# Patient Record
Sex: Female | Born: 1990 | Race: White | Marital: Single | Smoking: Current every day smoker
Health system: Northeastern US, Academic
[De-identification: ages and names within clinical notes are randomized; demographics above are authoritative.]

## PROBLEM LIST (undated history)

## (undated) ENCOUNTER — Emergency Department (HOSPITAL_COMMUNITY): Admission: EM | Payer: Self-pay | Source: Home / Self Care

## (undated) ENCOUNTER — Inpatient Hospital Stay (HOSPITAL_COMMUNITY): Payer: Self-pay

## (undated) DIAGNOSIS — O26899 Other specified pregnancy related conditions, unspecified trimester: Secondary | ICD-10-CM

## (undated) DIAGNOSIS — A6009 Herpesviral infection of other urogenital tract: Secondary | ICD-10-CM

## (undated) DIAGNOSIS — G43909 Migraine, unspecified, not intractable, without status migrainosus: Secondary | ICD-10-CM

## (undated) DIAGNOSIS — R519 Headache, unspecified: Secondary | ICD-10-CM

## (undated) DIAGNOSIS — R51 Headache: Secondary | ICD-10-CM

## (undated) DIAGNOSIS — J45909 Unspecified asthma, uncomplicated: Secondary | ICD-10-CM

## (undated) HISTORY — DX: Other specified pregnancy related conditions, unspecified trimester: O26.899

## (undated) HISTORY — DX: Headache, unspecified: R51.9

## (undated) HISTORY — DX: Headache: R51

## (undated) HISTORY — PX: CHOLECYSTECTOMY: SHX55

---
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
## 2008-01-03 ENCOUNTER — Ambulatory Visit (HOSPITAL_COMMUNITY): Admission: RE | Admit: 2008-01-03 | Discharge: 2008-01-03 | Payer: Self-pay | Admitting: Obstetrics and Gynecology

## 2008-02-02 ENCOUNTER — Inpatient Hospital Stay (HOSPITAL_COMMUNITY): Admission: AD | Admit: 2008-02-02 | Discharge: 2008-02-02 | Payer: Self-pay | Admitting: Obstetrics & Gynecology

## 2008-02-03 ENCOUNTER — Observation Stay (HOSPITAL_COMMUNITY): Admission: AD | Admit: 2008-02-03 | Discharge: 2008-02-04 | Payer: Self-pay | Admitting: Obstetrics and Gynecology

## 2008-02-11 ENCOUNTER — Observation Stay (HOSPITAL_COMMUNITY): Admission: AD | Admit: 2008-02-11 | Discharge: 2008-02-12 | Payer: Self-pay | Admitting: Obstetrics and Gynecology

## 2008-03-03 ENCOUNTER — Inpatient Hospital Stay (HOSPITAL_COMMUNITY): Admission: AD | Admit: 2008-03-03 | Discharge: 2008-03-04 | Payer: Self-pay | Admitting: Obstetrics and Gynecology

## 2008-03-05 ENCOUNTER — Inpatient Hospital Stay (HOSPITAL_COMMUNITY): Admission: AD | Admit: 2008-03-05 | Discharge: 2008-03-05 | Payer: Self-pay | Admitting: Obstetrics and Gynecology

## 2008-03-08 ENCOUNTER — Inpatient Hospital Stay (HOSPITAL_COMMUNITY): Admission: RE | Admit: 2008-03-08 | Discharge: 2008-03-10 | Payer: Self-pay | Admitting: Obstetrics & Gynecology

## 2008-07-18 ENCOUNTER — Emergency Department (HOSPITAL_COMMUNITY): Admission: EM | Admit: 2008-07-18 | Discharge: 2008-07-18 | Payer: Self-pay | Admitting: Emergency Medicine

## 2008-08-19 ENCOUNTER — Inpatient Hospital Stay (HOSPITAL_COMMUNITY): Admission: AD | Admit: 2008-08-19 | Discharge: 2008-08-20 | Payer: Self-pay | Admitting: Obstetrics and Gynecology

## 2008-10-04 ENCOUNTER — Inpatient Hospital Stay (HOSPITAL_COMMUNITY): Admission: AD | Admit: 2008-10-04 | Discharge: 2008-10-05 | Payer: Self-pay | Admitting: Obstetrics & Gynecology

## 2008-10-07 ENCOUNTER — Inpatient Hospital Stay (HOSPITAL_COMMUNITY): Admission: AD | Admit: 2008-10-07 | Discharge: 2008-10-08 | Payer: Self-pay | Admitting: Obstetrics and Gynecology

## 2008-11-14 ENCOUNTER — Inpatient Hospital Stay (HOSPITAL_COMMUNITY): Admission: AD | Admit: 2008-11-14 | Discharge: 2008-11-14 | Payer: Self-pay | Admitting: Obstetrics & Gynecology

## 2009-03-19 ENCOUNTER — Inpatient Hospital Stay (HOSPITAL_COMMUNITY): Admission: AD | Admit: 2009-03-19 | Discharge: 2009-03-19 | Payer: Self-pay | Admitting: Obstetrics and Gynecology

## 2009-03-19 ENCOUNTER — Inpatient Hospital Stay (HOSPITAL_COMMUNITY): Admission: AD | Admit: 2009-03-19 | Discharge: 2009-03-19 | Payer: Self-pay | Admitting: Obstetrics & Gynecology

## 2009-04-08 ENCOUNTER — Inpatient Hospital Stay (HOSPITAL_COMMUNITY): Admission: AD | Admit: 2009-04-08 | Discharge: 2009-04-08 | Payer: Self-pay | Admitting: Obstetrics and Gynecology

## 2009-06-18 ENCOUNTER — Inpatient Hospital Stay (HOSPITAL_COMMUNITY): Admission: AD | Admit: 2009-06-18 | Discharge: 2009-06-19 | Payer: Self-pay | Admitting: Obstetrics & Gynecology

## 2009-11-08 ENCOUNTER — Emergency Department (HOSPITAL_COMMUNITY): Admission: EM | Admit: 2009-11-08 | Discharge: 2009-11-08 | Payer: Self-pay | Admitting: Emergency Medicine

## 2010-03-01 ENCOUNTER — Inpatient Hospital Stay (HOSPITAL_COMMUNITY): Admission: AD | Admit: 2010-03-01 | Discharge: 2010-03-01 | Payer: Self-pay | Admitting: Obstetrics & Gynecology

## 2010-03-01 ENCOUNTER — Ambulatory Visit: Payer: Self-pay | Admitting: Nurse Practitioner

## 2010-07-07 ENCOUNTER — Emergency Department (HOSPITAL_COMMUNITY)
Admission: EM | Admit: 2010-07-07 | Discharge: 2010-07-07 | Payer: Self-pay | Source: Home / Self Care | Admitting: Emergency Medicine

## 2010-09-12 LAB — URINALYSIS, ROUTINE W REFLEX MICROSCOPIC
Bilirubin Urine: NEGATIVE
Ketones, ur: NEGATIVE mg/dL
Nitrite: NEGATIVE
Protein, ur: 30 mg/dL — AB

## 2010-09-12 LAB — URINE CULTURE: Colony Count: 45000

## 2010-09-12 LAB — WET PREP, GENITAL

## 2010-09-12 LAB — URINE MICROSCOPIC-ADD ON

## 2010-09-12 LAB — GC/CHLAMYDIA PROBE AMP, GENITAL: GC Probe Amp, Genital: NEGATIVE

## 2010-09-17 LAB — COMPREHENSIVE METABOLIC PANEL
AST: 18 U/L (ref 0–37)
Albumin: 4.5 g/dL (ref 3.5–5.2)
Alkaline Phosphatase: 67 U/L (ref 39–117)
Chloride: 106 mEq/L (ref 96–112)
GFR calc Af Amer: >60 mL/min (ref >60)
Potassium: 3.8 mEq/L (ref 3.5–5.1)
Total Bilirubin: 1.2 mg/dL (ref 0.3–1.2)
Total Protein: 8 g/dL (ref 6.0–8.3)

## 2010-09-17 LAB — DIFFERENTIAL
Eosinophils Relative: 3 % (ref 0–5)
Lymphocytes Relative: 27 % (ref 12–46)
Lymphs Abs: 2.1 10*3/uL (ref 0.7–4.0)
Monocytes Relative: 7 % (ref 3–12)
Neutro Abs: 5 10*3/uL (ref 1.7–7.7)

## 2010-09-17 LAB — URINE MICROSCOPIC-ADD ON

## 2010-09-17 LAB — URINALYSIS, ROUTINE W REFLEX MICROSCOPIC
Leukocytes, UA: NEGATIVE
Protein, ur: NEGATIVE mg/dL
Specific Gravity, Urine: >1.03 — ABNORMAL HIGH (ref 1.005–1.030)
Urobilinogen, UA: 0.2 mg/dL (ref 0.0–1.0)

## 2010-09-17 LAB — CBC
Platelets: 278 10*3/uL (ref 150–400)
WBC: 7.8 10*3/uL (ref 4.0–10.5)

## 2010-09-17 LAB — LIPASE, BLOOD: Lipase: 27 U/L (ref 11–59)

## 2010-09-30 LAB — GC/CHLAMYDIA PROBE AMP, GENITAL: GC Probe Amp, Genital: NEGATIVE

## 2010-09-30 LAB — WET PREP, GENITAL
Clue Cells Wet Prep HPF POC: NONE SEEN
Trich, Wet Prep: NONE SEEN

## 2010-09-30 LAB — POCT PREGNANCY, URINE: Preg Test, Ur: NEGATIVE

## 2010-10-03 LAB — HERPES SIMPLEX VIRUS CULTURE

## 2010-10-03 LAB — WET PREP, GENITAL
Trich, Wet Prep: NONE SEEN
Yeast Wet Prep HPF POC: NONE SEEN

## 2010-10-03 LAB — GC/CHLAMYDIA PROBE AMP, GENITAL
Chlamydia, DNA Probe: POSITIVE — AB
GC Probe Amp, Genital: NEGATIVE

## 2010-10-04 LAB — URINE MICROSCOPIC-ADD ON

## 2010-10-04 LAB — CBC
MCHC: 35.2 g/dL (ref 30.0–36.0)
RBC: 4.14 MIL/uL (ref 3.87–5.11)

## 2010-10-04 LAB — HERPES SIMPLEX VIRUS CULTURE

## 2010-10-04 LAB — URINALYSIS, ROUTINE W REFLEX MICROSCOPIC
Glucose, UA: NEGATIVE mg/dL
Ketones, ur: NEGATIVE mg/dL
Protein, ur: NEGATIVE mg/dL

## 2010-10-04 LAB — WET PREP, GENITAL: Trich, Wet Prep: NONE SEEN

## 2010-10-08 LAB — URINALYSIS, ROUTINE W REFLEX MICROSCOPIC
Glucose, UA: NEGATIVE mg/dL
Leukocytes, UA: NEGATIVE
Nitrite: NEGATIVE
Protein, ur: NEGATIVE mg/dL
Urobilinogen, UA: 0.2 mg/dL (ref 0.0–1.0)

## 2010-10-08 LAB — URINE MICROSCOPIC-ADD ON

## 2010-10-08 LAB — WET PREP, GENITAL: Trich, Wet Prep: NONE SEEN

## 2010-10-09 LAB — URINALYSIS, ROUTINE W REFLEX MICROSCOPIC
Leukocytes, UA: NEGATIVE
Protein, ur: NEGATIVE mg/dL
Specific Gravity, Urine: 1.02 (ref 1.005–1.030)
Urobilinogen, UA: 0.2 mg/dL (ref 0.0–1.0)

## 2010-10-09 LAB — DIFFERENTIAL
Basophils Absolute: 0 10*3/uL (ref 0.0–0.1)
Basophils Relative: 1 % (ref 0–1)
Eosinophils Absolute: 0.5 10*3/uL (ref 0.0–0.7)
Eosinophils Relative: 8 % — ABNORMAL HIGH (ref 0–5)
Lymphs Abs: 2.5 10*3/uL (ref 0.7–4.0)
Neutrophils Relative %: 43 % (ref 43–77)

## 2010-10-09 LAB — CBC
HCT: 37.4 % (ref 36.0–46.0)
MCHC: 34.6 g/dL (ref 30.0–36.0)
MCV: 90.3 fL (ref 78.0–100.0)
Platelets: 240 10*3/uL (ref 150–400)
RDW: 13.6 % (ref 11.5–15.5)
WBC: 6.1 10*3/uL (ref 4.0–10.5)

## 2010-10-09 LAB — URINE MICROSCOPIC-ADD ON

## 2010-10-09 LAB — HCG, QUANTITATIVE, PREGNANCY: hCG, Beta Chain, Quant, S: <2 m[IU]/mL (ref <5)

## 2010-10-09 LAB — WET PREP, GENITAL: Yeast Wet Prep HPF POC: NONE SEEN

## 2010-10-10 LAB — POCT PREGNANCY, URINE: Preg Test, Ur: NEGATIVE

## 2010-10-14 LAB — CBC
Hemoglobin: 13.4 g/dL (ref 12.0–15.0)
MCHC: 34.5 g/dL (ref 30.0–36.0)
RBC: 4.33 MIL/uL (ref 3.87–5.11)

## 2010-10-14 LAB — DIFFERENTIAL
Basophils Relative: 0 % (ref 0–1)
Eosinophils Absolute: 0.3 10*3/uL (ref 0.0–0.7)
Eosinophils Relative: 5 % (ref 0–5)
Lymphs Abs: 2.3 10*3/uL (ref 0.7–4.0)
Monocytes Relative: 7 % (ref 3–12)

## 2010-10-14 LAB — URINALYSIS, ROUTINE W REFLEX MICROSCOPIC
Bilirubin Urine: NEGATIVE
Nitrite: NEGATIVE
Specific Gravity, Urine: 1.028 (ref 1.005–1.030)
Urobilinogen, UA: 1 mg/dL (ref 0.0–1.0)
pH: 6.5 (ref 5.0–8.0)

## 2010-10-14 LAB — POCT PREGNANCY, URINE: Preg Test, Ur: NEGATIVE

## 2010-10-14 LAB — COMPREHENSIVE METABOLIC PANEL
ALT: 27 U/L (ref 0–35)
AST: 21 U/L (ref 0–37)
Alkaline Phosphatase: 89 U/L (ref 39–117)
CO2: 27 mEq/L (ref 19–32)
Calcium: 8.8 mg/dL (ref 8.4–10.5)
GFR calc Af Amer: >60 mL/min (ref >60)
GFR calc non Af Amer: >60 mL/min (ref >60)
Potassium: 4.2 mEq/L (ref 3.5–5.1)
Sodium: 137 mEq/L (ref 135–145)
Total Protein: 7.3 g/dL (ref 6.0–8.3)

## 2010-10-15 LAB — CBC
HCT: 35.1 % — ABNORMAL LOW (ref 36.0–46.0)
Hemoglobin: 12.3 g/dL (ref 12.0–15.0)
MCHC: 34.9 g/dL (ref 30.0–36.0)
MCV: 89.9 fL (ref 78.0–100.0)
RDW: 13.1 % (ref 11.5–15.5)

## 2010-10-15 LAB — WET PREP, GENITAL

## 2010-11-12 NOTE — Discharge Summary (Signed)
NAMERAPHAEL, ESPE                ACCOUNT NO.:  [id number]   MEDICAL RECORD NO.:  [id number]           PATIENT TYPE:   LOCATION:                                [facility]  [facility]   PHYSICIAN:  Randye Lobo, M.D.   DATE OF BIRTH:  29-Aug-1990   DATE OF ADMISSION:  02/11/2008  DATE OF DISCHARGE:                               DISCHARGE SUMMARY   FINAL DIAGNOSES:  Intrauterine pregnancy at 51 weeks' gestation, preterm  labor, urinary tract infection.   COMPLICATIONS:  None.   This 20 year old G2, P1-0-0-1, presents at 32+ weeks' gestation  complaining of pressure.  The patient's antepartum course up to this  point had been complicated by late prenatal care.  The patient was Rh  negative, did receive RhoGAM per protocol.  The patient also has a  history of recurrent urinary tract infection, was on prophylaxis daily.  The patient also is an asthmatic, but just taking albuterol inhaler as  needed.  Upon admission, the patient's cervix was already 3 cm dilated.  She was contracting about every 5-10 minutes.  She was started on oral  Procardia.  Group B Strep culture was obtained.  The patient was also  found to have a urinary tract infection with a history of UTIs in the  past.  The patient had a fetal fibronectin that was performed upon  admission that did return negative.  The patient was also started on  Ceftin 250 mg b.i.d. for urinary tract infection.  She was felt ready  for discharge at this time.  She was sent home on moderate bedrest to  continue her Procardia 10 mg every 8 hours and her antibiotic, is to  return to our office in 3 days for a recheck as well as to call us with  any other contractions, pain, or problems.   LABS ON DISCHARGE:  Upon discharge, the patient did have a group B Strep  culture that returned positive.      Leilani Able, P.A.-C.      Randye Lobo, M.D.  Electronically Signed    MB/MEDQ  D:  03/06/2008  T:  03/06/2008  Job:  [id number]

## 2010-11-12 NOTE — Discharge Summary (Signed)
Katrina [name], Katrina [name]                ACCOUNT NO.:  [id number]   MEDICAL RECORD NO.:  [id number]          PATIENT TYPE:  OBV   LOCATION:  9317                          [facility]ILITY:  [facility]   PHYSICIAN:  Malva Limes, M.D.    DATE OF BIRTH:  22-Nov-1990   DATE OF ADMISSION:  02/11/2008  DATE OF DISCHARGE:  02/12/2008                               DISCHARGE SUMMARY   FINAL DIAGNOSIS:  1. Intrauterine pregnancy at 66 weeks' gestation.  2. Cholestasis of pregnancy.   COMPLICATIONS:  None.   HOSPITAL COURSE:  This 20 year old G2, P1-0-0-1, presents to the [facility] complaining of weeks of intense pruritus.  The patient's  antepartum course up to this point had been complicated by some preterm  contractions, which she was admitted for around 32 weeks as well as  asthma.  Upon admission, the patient's liver function as well as  delivery were noted to be elevated.  The patient was not contracting and  had good fetal activity.  The patient was diagnosed with cholestasis of  pregnancy.  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 for the cholestasis.  She  is to return to have twice weekly NSTs and LFT checks once weekly.  The  patient would be delivered at 37 weeks.  Dr. Dareen Piano did discuss this  with maternal fetal medicine as well.   LABORATORIES UPON DISCHARGE:  The patient had hemoglobin of 11.4, white  blood cell count of 8.4, platelets of 189,000, and did have elevated  liver function test as well as bilirubin.      Leilani Able, P.A.-C.      ______________________________  Malva Limes, M.D.    MB/MEDQ  D:  03/06/2008  T:  03/06/2008  Job:  [id number]

## 2010-11-15 NOTE — H&P (Signed)
[name], [name]                ACCOUNT NO.:  [id number]   MEDICAL RECORD NO.:  [id number]          PATIENT TYPE:  INP   LOCATION:  9165                          [facility]ILITY:  [facility]   PHYSICIAN:  Roseanna Rainbow, M.D.DATE OF BIRTH:  July 28, 1990   DATE OF ADMISSION:  06/28/2006  DATE OF DISCHARGE:                              HISTORY & PHYSICAL   CHIEF COMPLAINT:  The patient is a 20 year old para 0 with an estimated  date of confinement of July 08, 2006, with an intrauterine pregnancy  at 38+ weeks complaining of uterine contractions.   HISTORY OF PRESENT ILLNESS:  Please see the above.   ALLERGIES:  NO KNOWN DRUG ALLERGIES.   MEDICATIONS:  Please see the Medication Reconciliation Form.   OB RISK FACTORS:  1. Rh-negative, nonsensitized.  2. Asthma.  3. Marginal cord insertion.   PRENATAL LABS:  Chlamydia DNA probe negative, 1 hour GCT 131, GC DNA  probe negative, Hepatitis B Surface Antigen negative, hematocrit 36.7,  hemoglobin 12.3, HIV nonreactive, Pap smear negative, platelets 281,000.  Blood type is O negative, antibody screen negative.  RPR nonreactive,  rubella immune.   PAST GYN HISTORY:  Noncontributory.   PAST MEDICAL HISTORY:  Asthma.   PAST SURGICAL HISTORY:  No previous surgery.   SOCIAL HISTORY:  She is single, does not give any significant history of  alcohol usage, has no significant smoking history, denies illicit drug  use.   FAMILY HISTORY:  No major illnesses known.   PHYSICAL EXAM:  VITAL SIGNS:  Temperature 97.4, heart rate 66,  respirations 18, blood pressure 136/93.  Fetal heart tracing reassuring.  Tocodynamometer uterine contractions every 2-5 minutes.  Sterile vaginal  exam : the cervix is  90% effaced, vertex at a -2 station.   ASSESSMENT:  1. Primigravida with an intrauterine pregnancy at term.  2. Early labor.  3. Fetal heart tracing consistent with fetal well-being.  4. She is group B Streptococcus negative.  5. She is also  rhesus-negative, nonsensitized.   PLAN:  1. Admission.  2. Expected management.      Roseanna Rainbow, M.D.  Electronically Signed     LAJ/MEDQ  D:  06/28/2006  T:  06/28/2006  Job:  [id number]

## 2011-01-29 NOTE — Telephone Encounter (Deleted)
Pt called and states that the vials were received but not the syringes. Pls call [facility] on [address] [phone]. She said the pharmacy stated that nothing was there for her to pick up. Pls address asap since pt says that her headaches are unbearable. Thanks!

## 2011-03-20 LAB — GC/CHLAMYDIA PROBE AMP, GENITAL: GC Probe Amp, Genital: NEGATIVE

## 2011-03-20 LAB — WET PREP, GENITAL: Yeast Wet Prep HPF POC: NONE SEEN

## 2011-03-20 LAB — URINALYSIS, ROUTINE W REFLEX MICROSCOPIC
Hgb urine dipstick: NEGATIVE
Nitrite: NEGATIVE
Protein, ur: NEGATIVE
Urobilinogen, UA: 0.2

## 2011-03-20 LAB — POCT PREGNANCY, URINE
Operator id: 114931
Preg Test, Ur: POSITIVE

## 2011-03-25 LAB — URINALYSIS, ROUTINE W REFLEX MICROSCOPIC
Nitrite: NEGATIVE
Specific Gravity, Urine: 1.01
Urobilinogen, UA: 0.2

## 2011-03-25 LAB — DIFFERENTIAL
Basophils Absolute: 0.1
Lymphocytes Relative: 22
Monocytes Relative: 5
Neutro Abs: 5.9

## 2011-03-25 LAB — GC/CHLAMYDIA PROBE AMP, GENITAL: Chlamydia, DNA Probe: NEGATIVE

## 2011-03-25 LAB — CBC
HCT: 32.9 — ABNORMAL LOW
Platelets: 233
RDW: 14.7

## 2011-03-25 LAB — WET PREP, GENITAL
Trich, Wet Prep: NONE SEEN
Trich, Wet Prep: NONE SEEN
Yeast Wet Prep HPF POC: NONE SEEN
Yeast Wet Prep HPF POC: NONE SEEN

## 2011-03-27 LAB — RH IMMUNE GLOBULIN WORKUP (NOT WOMEN'S HOSP)

## 2011-03-28 LAB — WET PREP, GENITAL
Clue Cells Wet Prep HPF POC: NONE SEEN
Trich, Wet Prep: NONE SEEN

## 2011-03-28 LAB — URINALYSIS, ROUTINE W REFLEX MICROSCOPIC
Glucose, UA: NEGATIVE
Ketones, ur: NEGATIVE
Ketones, ur: NEGATIVE
Leukocytes, UA: NEGATIVE
Nitrite: POSITIVE — AB
Protein, ur: NEGATIVE
Specific Gravity, Urine: <1.005 — ABNORMAL LOW
pH: 6.5

## 2011-03-28 LAB — COMPREHENSIVE METABOLIC PANEL
Alkaline Phosphatase: 174 — ABNORMAL HIGH
BUN: 6
Chloride: 104
GFR calc non Af Amer: >60
Glucose, Bld: 101 — ABNORMAL HIGH
Potassium: 3.4 — ABNORMAL LOW
Total Bilirubin: 1.3 — ABNORMAL HIGH

## 2011-03-28 LAB — URINE MICROSCOPIC-ADD ON: RBC / HPF: NONE SEEN

## 2011-03-28 LAB — FETAL FIBRONECTIN: Fetal Fibronectin: NEGATIVE

## 2011-03-28 LAB — STREP B DNA PROBE

## 2011-04-02 LAB — CBC
HCT: 31.9 — ABNORMAL LOW
Hemoglobin: 11.1 — ABNORMAL LOW
MCHC: 34.7
RBC: 3.57 — ABNORMAL LOW
RDW: 14.6
WBC: 9.6

## 2011-04-02 LAB — HEPATIC FUNCTION PANEL
ALT: 172 — ABNORMAL HIGH
ALT: 173 — ABNORMAL HIGH
AST: 110 — ABNORMAL HIGH
Albumin: 2.4 — ABNORMAL LOW
Indirect Bilirubin: 0.7
Indirect Bilirubin: 1 — ABNORMAL HIGH
Total Protein: 5.6 — ABNORMAL LOW
Total Protein: 5.8 — ABNORMAL LOW

## 2011-04-02 LAB — RPR: RPR Ser Ql: NONREACTIVE

## 2011-04-02 LAB — RH IMMUNE GLOB WKUP(>/=20WKS)(NOT WOMEN'S HOSP)

## 2011-05-15 ENCOUNTER — Inpatient Hospital Stay (HOSPITAL_COMMUNITY)
Admission: AD | Admit: 2011-05-15 | Discharge: 2011-05-15 | Disposition: A | Discharge status: Home / Self Care | Payer: Self-pay | Source: Ambulatory Visit | Attending: Obstetrics and Gynecology | Admitting: Obstetrics and Gynecology

## 2011-05-15 ENCOUNTER — Inpatient Hospital Stay (HOSPITAL_COMMUNITY): Payer: Self-pay

## 2011-05-15 ENCOUNTER — Encounter (HOSPITAL_COMMUNITY): Payer: Self-pay

## 2011-05-15 DIAGNOSIS — R109 Unspecified abdominal pain: Secondary | ICD-10-CM | POA: Insufficient documentation

## 2011-05-15 DIAGNOSIS — N390 Urinary tract infection, site not specified: Secondary | ICD-10-CM | POA: Insufficient documentation

## 2011-05-15 HISTORY — DX: Herpesviral infection of other urogenital tract: A60.09

## 2011-05-15 LAB — URINE MICROSCOPIC-ADD ON

## 2011-05-15 LAB — URINALYSIS, ROUTINE W REFLEX MICROSCOPIC
Bilirubin Urine: NEGATIVE
Nitrite: POSITIVE — AB
Specific Gravity, Urine: 1.025 (ref 1.005–1.030)
pH: 7.5 (ref 5.0–8.0)

## 2011-05-15 LAB — CBC
HCT: 38.8 % (ref 36.0–46.0)
Hemoglobin: 13.6 g/dL (ref 12.0–15.0)
MCV: 93 fL (ref 78.0–100.0)
RDW: 13.5 % (ref 11.5–15.5)
WBC: 6 10*3/uL (ref 4.0–10.5)

## 2011-05-15 LAB — DIFFERENTIAL
Basophils Absolute: 0 10*3/uL (ref 0.0–0.1)
Eosinophils Relative: 3 % (ref 0–5)
Lymphocytes Relative: 26 % (ref 12–46)
Monocytes Absolute: 0.3 10*3/uL (ref 0.1–1.0)
Monocytes Relative: 4 % (ref 3–12)

## 2011-05-15 LAB — POCT PREGNANCY, URINE: Preg Test, Ur: NEGATIVE

## 2011-05-15 LAB — WET PREP, GENITAL

## 2011-05-15 MED ORDER — NITROFURANTOIN MONOHYD MACRO 100 MG PO CAPS
100.0000 mg | ORAL_CAPSULE | Freq: Once | ORAL | Status: AC
  Administered 2011-05-15: 100 mg via ORAL
  Filled 2011-05-15: qty 1

## 2011-05-15 MED ORDER — OXYCODONE-ACETAMINOPHEN 5-325 MG PO TABS
1.0000 | ORAL_TABLET | Freq: Once | ORAL | Status: AC
  Administered 2011-05-15: 1 via ORAL
  Filled 2011-05-15: qty 1

## 2011-05-15 MED ORDER — NITROFURANTOIN MONOHYD MACRO 100 MG PO CAPS: 100.0000 mg | ORAL_CAPSULE | Freq: Two times a day (BID) | ORAL | Status: AC

## 2011-05-15 NOTE — Progress Notes (Signed)
Pt states lower abdominal pain started 5 days ago, during & after having intercourse.

## 2011-05-15 NOTE — ED Provider Notes (Signed)
History     Chief Complaint  Patient presents with  . Abdominal Pain  . Emesis   HPI 20 y.o. presents with c/o lower abdominal pain and low back pain since having intercourse  5 days ago. Had Mirena IUD place by Dr Arlyce Dice 3 yrs ago but has not seen a doctor since then. States has been vomiting for several days. Last BM 3 days ago. No fever.   OB History    Grav Para Term Preterm Abortions TAB SAB Ect Mult Living   2 2 2  0 0 0 0 0 0 2      Past Medical History  Diagnosis Date  . Asthma   . Herpes simplex of female genitalia     last outbreak 4 months ago    Past Surgical History  Procedure Date  . No past surgeries     No family history on file.  History  Substance Use Topics  . Smoking status: Current Everyday Smoker -- 0.2 packs/day    Types: Cigarettes  . Smokeless tobacco: Not on file  . Alcohol Use: No    Allergies: No Known Allergies  Prescriptions prior to admission  Medication Sig Dispense Refill  . albuterol (PROVENTIL HFA;VENTOLIN HFA) 108 (90 BASE) MCG/ACT inhaler Inhale 2 puffs into the lungs every 6 (six) hours as needed. For SOB         Review of Systems  Constitutional: Negative for fever and chills.  Gastrointestinal: Positive for nausea, vomiting and abdominal pain ("all over"). Negative for diarrhea and constipation.  Genitourinary: Negative.        Neg for bleeding.   Positive for painful intercourse   Physical Exam   Blood pressure 128/90, pulse 75, temperature 98.5 F (36.9 C), resp. rate 18, height 5\' 1"  (1.549 m), weight 133 lb (60.328 kg).  Physical Exam  Nursing note and vitals reviewed. Constitutional: She is oriented to person, place, and time. She appears well-developed and well-nourished. No distress.  HENT:  Head: Normocephalic.  Neck: Normal range of motion.  Respiratory: Effort normal.  GI: Soft. She exhibits mass. There is tenderness (all quadrants with more pain on the right lower quadrant). There is rebound and  guarding.  Genitourinary: Uterus is tender. Uterus is not enlarged. Cervix exhibits motion tenderness and discharge (yellow discharge). Cervix exhibits no friability. Right adnexum displays tenderness. Left adnexum displays tenderness. There is tenderness around the vagina. No bleeding around the vagina. Vaginal discharge (moderate amount of yellow discharge) found.       Unable to see IUD string because of discomfort of exam  Neurological: She is alert and oriented to person, place, and time.  Skin: Skin is warm and dry.   Results for orders placed during the hospital encounter of 05/15/11 (from the past 24 hour(s))  URINALYSIS, ROUTINE W REFLEX MICROSCOPIC     Status: Abnormal   Collection Time   05/15/11  3:30 PM      Component Value Range   Color, Urine YELLOW  YELLOW    Appearance CLEAR  CLEAR    Specific Gravity, Urine 1.025  1.005 - 1.030    pH 7.5  5.0 - 8.0    Glucose, UA NEGATIVE  NEGATIVE (mg/dL)   Hgb urine dipstick TRACE (*) NEGATIVE    Bilirubin Urine NEGATIVE  NEGATIVE    Ketones, ur 15 (*) NEGATIVE (mg/dL)   Protein, ur NEGATIVE  NEGATIVE (mg/dL)   Urobilinogen, UA 2.0 (*) 0.0 - 1.0 (mg/dL)   Nitrite POSITIVE (*) NEGATIVE  Leukocytes, UA TRACE (*) NEGATIVE   URINE MICROSCOPIC-ADD ON     Status: Abnormal   Collection Time   05/15/11  3:30 PM      Component Value Range   Squamous Epithelial / LPF FEW (*) RARE    WBC, UA 11-20  <3 (WBC/hpf)   RBC / HPF 0-2  <3 (RBC/hpf)   Bacteria, UA MANY (*) RARE    Urine-Other MUCOUS PRESENT    POCT PREGNANCY, URINE     Status: Normal   Collection Time   05/15/11  3:37 PM      Component Value Range   Preg Test, Ur NEGATIVE    WET PREP, GENITAL     Status: Abnormal   Collection Time   05/15/11  4:12 PM      Component Value Range   Yeast, Wet Prep NONE SEEN  NONE SEEN    Trich, Wet Prep NONE SEEN  NONE SEEN    Clue Cells, Wet Prep NONE SEEN  NONE SEEN    WBC, Wet Prep HPF POC FEW (*) NONE SEEN   CBC     Status: Normal    Collection Time   05/15/11  4:29 PM      Component Value Range   WBC 6.0  4.0 - 10.5 (K/uL)   RBC 4.17  3.87 - 5.11 (MIL/uL)   Hemoglobin 13.6  12.0 - 15.0 (g/dL)   HCT 16.1  09.6 - 04.5 (%)   MCV 93.0  78.0 - 100.0 (fL)   MCH 32.6  26.0 - 34.0 (pg)   MCHC 35.1  30.0 - 36.0 (g/dL)   RDW 40.9  81.1 - 91.4 (%)   Platelets 265  150 - 400 (K/uL)  DIFFERENTIAL     Status: Normal   Collection Time   05/15/11  4:29 PM      Component Value Range   Neutrophils Relative 67  43 - 77 (%)   Neutro Abs 4.0  1.7 - 7.7 (K/uL)   Lymphocytes Relative 26  12 - 46 (%)   Lymphs Abs 1.6  0.7 - 4.0 (K/uL)   Monocytes Relative 4  3 - 12 (%)   Monocytes Absolute 0.3  0.1 - 1.0 (K/uL)   Eosinophils Relative 3  0 - 5 (%)   Eosinophils Absolute 0.2  0.0 - 0.7 (K/uL)   Basophils Relative 0  0 - 1 (%)   Basophils Absolute 0.0  0.0 - 0.1 (K/uL)       *RADIOLOGY REPORT*  Clinical Data: Pelvic pain.  TRANSABDOMINAL AND TRANSVAGINAL ULTRASOUND OF PELVIS  Technique: Both transabdominal and transvaginal ultrasound  examinations of the pelvis were performed. Transabdominal technique  was performed for global imaging of the pelvis including uterus,  ovaries, adnexal regions, and pelvic cul-de-sac.  Comparison: None.  It was necessary to proceed with endovaginal exam following the  transabdominal exam to visualize the ovaries and endometrium.  Findings:  Uterus: Measures 6.6 x 4.1 x 5.1 cm. No myometrial abnormalities.  Endometrium: Normal in thickness measuring a maximum of 4 mm. The  IUD is in the endometrial canal.  Right ovary: Measures 2.9 x 2.0 x 2.3 cm. No cyst or masses.  Left ovary: Measures 2.7 x 1.9 x 1.7 cm. No cysts or masses.  Other findings: No free fluid  IMPRESSION:  1. Normal sonographic appearance of the uterus and ovaries.  2. The IUD is in the endometrial canal.  Original Report Authenticated By: P. Loralie Champagne, M.D.     MAU Course  Procedures   GC/CHL culture to lab, Urine  culture to lab.  MDM    ROS and Exam by Lynder Parents, RN FNP   17:48 patient is back from u/s and is asking for pain medication.  She has a ride home.   Percocet 1 tab and Macrobid 1 tab given in MAU.Marland Kitchen   18:25 Patient states she is no longer having pain and is ready to go home.    Assessment and Plan  Report to Eve Camielle Sizer NP.  A: Abdominal Pain      Urinary Tract Infection  P:  Rx for Macrobid bid for 1 week       Increase po fluids     GCCHL and Urine cultures pending.   [facility] 05/15/2011, 4:02 PM   Matt Holmes, NP 05/15/11 1827

## 2011-05-15 NOTE — Progress Notes (Signed)
N&V x 5 days, emesis x 3 today, abd pain x 5 days, LMP last year, has IUD in place (DR Arlyce Dice) no longer sees this MD.

## 2011-05-16 LAB — GC/CHLAMYDIA PROBE AMP, GENITAL
Chlamydia, DNA Probe: NEGATIVE
GC Probe Amp, Genital: NEGATIVE

## 2011-05-17 LAB — URINE CULTURE

## 2011-05-22 NOTE — ED Provider Notes (Signed)
Agree with above note.  Katrina [name] 05/22/2011 8:21 PM

## 2011-06-17 ENCOUNTER — Encounter (HOSPITAL_COMMUNITY): Payer: Self-pay | Admitting: *Deleted

## 2011-06-17 ENCOUNTER — Emergency Department (HOSPITAL_COMMUNITY)
Admission: EM | Admit: 2011-06-17 | Discharge: 2011-06-18 | Disposition: A | Discharge status: Home / Self Care | Payer: Self-pay | Attending: Emergency Medicine | Admitting: Emergency Medicine

## 2011-06-17 DIAGNOSIS — R05 Cough: Secondary | ICD-10-CM | POA: Insufficient documentation

## 2011-06-17 DIAGNOSIS — J45909 Unspecified asthma, uncomplicated: Secondary | ICD-10-CM | POA: Insufficient documentation

## 2011-06-17 DIAGNOSIS — R51 Headache: Secondary | ICD-10-CM | POA: Insufficient documentation

## 2011-06-17 DIAGNOSIS — R509 Fever, unspecified: Secondary | ICD-10-CM | POA: Insufficient documentation

## 2011-06-17 DIAGNOSIS — R0602 Shortness of breath: Secondary | ICD-10-CM | POA: Insufficient documentation

## 2011-06-17 DIAGNOSIS — R110 Nausea: Secondary | ICD-10-CM | POA: Insufficient documentation

## 2011-06-17 DIAGNOSIS — J111 Influenza due to unidentified influenza virus with other respiratory manifestations: Secondary | ICD-10-CM | POA: Insufficient documentation

## 2011-06-17 DIAGNOSIS — R5383 Other fatigue: Secondary | ICD-10-CM | POA: Insufficient documentation

## 2011-06-17 DIAGNOSIS — R6889 Other general symptoms and signs: Secondary | ICD-10-CM | POA: Insufficient documentation

## 2011-06-17 DIAGNOSIS — R5381 Other malaise: Secondary | ICD-10-CM | POA: Insufficient documentation

## 2011-06-17 DIAGNOSIS — IMO0001 Reserved for inherently not codable concepts without codable children: Secondary | ICD-10-CM | POA: Insufficient documentation

## 2011-06-17 DIAGNOSIS — R059 Cough, unspecified: Secondary | ICD-10-CM | POA: Insufficient documentation

## 2011-06-17 MED ORDER — OSELTAMIVIR PHOSPHATE 75 MG PO CAPS: 75.0000 mg | ORAL_CAPSULE | Freq: Two times a day (BID) | ORAL | Status: AC

## 2011-06-17 MED ORDER — HYDROCOD POLST-CHLORPHEN POLST 10-8 MG/5ML PO LQCR: 5.0000 mL | Freq: Two times a day (BID) | ORAL | Status: DC

## 2011-06-17 MED ORDER — ACETAMINOPHEN 325 MG PO TABS
650.0000 mg | ORAL_TABLET | Freq: Once | ORAL | Status: AC
  Administered 2011-06-17: 650 mg via ORAL
  Filled 2011-06-17: qty 2

## 2011-06-17 MED ORDER — SODIUM CHLORIDE 0.9 % IV BOLUS (SEPSIS)
1000.0000 mL | Freq: Once | INTRAVENOUS | Status: AC
  Administered 2011-06-17: 1000 mL via INTRAVENOUS

## 2011-06-17 NOTE — ED Provider Notes (Signed)
History     CSN: [id number] Arrival date & time: 06/17/2011  5:52 PM   First MD Initiated Contact with Patient 06/17/11 2042      Chief Complaint  Patient presents with  . Chills  . Generalized Body Aches  . Headache  . Fever    (Consider location/radiation/quality/duration/timing/severity/associated sxs/prior treatment) HPI Comments: Patient here with a two day history of fever, chills, headache, nausea, cough, runny nose and body aches.  She reports no recent sick contacts, no flu shot this year - does not know how high her fever has been.  Patient is a 20 y.o. female presenting with headaches and fever. The history is provided by the patient. No language interpreter was used.  Headache  This is a new problem. The current episode started 2 days ago. The problem occurs constantly. The problem has not changed since onset.The headache is associated with nothing. The pain is located in the bilateral region. The quality of the pain is described as dull. The pain is at a severity of 6/10. The pain is moderate. The pain does not radiate. Associated symptoms include a fever, malaise/fatigue, shortness of breath and nausea. Pertinent negatives include no chest pressure, no near-syncope, no orthopnea, no syncope and no vomiting. She has tried nothing for the symptoms. The treatment provided no relief.  Fever Primary symptoms of the febrile illness include fever, headaches, shortness of breath and nausea. Primary symptoms do not include vomiting.    Past Medical History  Diagnosis Date  . Asthma   . Herpes simplex of female genitalia     last outbreak 4 months ago    Past Surgical History  Procedure Date  . No past surgeries     No family history on file.  History  Substance Use Topics  . Smoking status: Current Everyday Smoker -- 0.2 packs/day    Types: Cigarettes  . Smokeless tobacco: Not on file  . Alcohol Use: No    OB History    Grav Para Term Preterm Abortions TAB SAB  Ect Mult Living   2 2 2  0 0 0 0 0 0 2      Review of Systems  Constitutional: Positive for fever and malaise/fatigue.  Respiratory: Positive for shortness of breath.   Cardiovascular: Negative for orthopnea, syncope and near-syncope.  Gastrointestinal: Positive for nausea. Negative for vomiting.  Neurological: Positive for headaches.  All other systems reviewed and are negative.    Allergies  Review of patient's allergies indicates no known allergies.  Home Medications   Current Outpatient Rx  Name Route Sig Dispense Refill  . ALBUTEROL SULFATE HFA 108 (90 BASE) MCG/ACT IN AERS Inhalation Inhale 2 puffs into the lungs every 6 (six) hours as needed. For shortness of breath      BP 113/65  Pulse 129  Temp(Src) 99.3 F (37.4 C) (Oral)  Resp 22  SpO2 97%  Physical Exam  Nursing note and vitals reviewed. Constitutional: She is oriented to person, place, and time. She appears well-developed and well-nourished. No distress.  HENT:  Head: Normocephalic and atraumatic.  Right Ear: External ear normal.  Left Ear: External ear normal.  Mouth/Throat: Oropharynx is clear and moist. No oropharyngeal exudate.  Eyes: Conjunctivae are normal. Pupils are equal, round, and reactive to light. No scleral icterus.  Neck: Normal range of motion. Neck supple.  Cardiovascular: Regular rhythm and normal heart sounds.  Exam reveals no gallop and no friction rub.   No murmur heard.      tachycardia  Pulmonary/Chest: Effort normal and breath sounds normal. No respiratory distress. She has no wheezes. She exhibits no tenderness.  Abdominal: Soft. Bowel sounds are normal. She exhibits no distension. There is no tenderness.  Musculoskeletal: Normal range of motion.  Lymphadenopathy:    She has no cervical adenopathy.  Neurological: She is alert and oriented to person, place, and time. No cranial nerve deficit.  Skin: Skin is warm and dry. No rash noted. No erythema. No pallor.  Psychiatric: She  has a normal mood and affect. Her behavior is normal. Judgment and thought content normal.    ED Course  Procedures (including critical care time)  Labs Reviewed - No data to display No results found.   Influenza    MDM  Patient with influenza symptoms - remains with fever of 101.3 (oral) - will give tylenol for this and a liter of fluids.      Continues with headache - vital signs now normalized with HR 103, temp down to 99.8 oral - will discharge home   Scarlette Calico C. [place], [place] 06/17/11 2342

## 2011-06-17 NOTE — ED Notes (Signed)
Patient reports onset of fever, headache, bodyaches, and chills on Saturday.

## 2011-06-25 NOTE — ED Provider Notes (Signed)
Medical screening examination/treatment/procedure(s) were performed by non-physician practitioner and as supervising physician I was immediately available for consultation/collaboration.   Breawna Montenegro E Abbeygail Igoe, MD 06/25/11 0741 

## 2011-09-09 NOTE — Telephone Encounter (Signed)
Opened in error

## 2011-09-23 ENCOUNTER — Other Ambulatory Visit: Payer: Self-pay

## 2011-09-23 ENCOUNTER — Emergency Department (HOSPITAL_COMMUNITY)
Admission: EM | Admit: 2011-09-23 | Discharge: 2011-09-23 | Disposition: A | Discharge status: Home / Self Care | Payer: Self-pay | Attending: Emergency Medicine | Admitting: Emergency Medicine

## 2011-09-23 ENCOUNTER — Emergency Department (HOSPITAL_COMMUNITY): Payer: Self-pay

## 2011-09-23 ENCOUNTER — Encounter (HOSPITAL_COMMUNITY): Payer: Self-pay | Admitting: Emergency Medicine

## 2011-09-23 DIAGNOSIS — T50905A Adverse effect of unspecified drugs, medicaments and biological substances, initial encounter: Secondary | ICD-10-CM

## 2011-09-23 DIAGNOSIS — T43205A Adverse effect of unspecified antidepressants, initial encounter: Secondary | ICD-10-CM | POA: Insufficient documentation

## 2011-09-23 DIAGNOSIS — F17200 Nicotine dependence, unspecified, uncomplicated: Secondary | ICD-10-CM | POA: Insufficient documentation

## 2011-09-23 DIAGNOSIS — J45909 Unspecified asthma, uncomplicated: Secondary | ICD-10-CM | POA: Insufficient documentation

## 2011-09-23 DIAGNOSIS — F411 Generalized anxiety disorder: Secondary | ICD-10-CM | POA: Insufficient documentation

## 2011-09-23 DIAGNOSIS — R079 Chest pain, unspecified: Secondary | ICD-10-CM | POA: Insufficient documentation

## 2011-09-23 DIAGNOSIS — Y92009 Unspecified place in unspecified non-institutional (private) residence as the place of occurrence of the external cause: Secondary | ICD-10-CM | POA: Insufficient documentation

## 2011-09-23 LAB — CBC
HCT: 41 % (ref 36.0–46.0)
Hemoglobin: 14.5 g/dL (ref 12.0–15.0)
MCH: 32.2 pg (ref 26.0–34.0)
MCV: 91.1 fL (ref 78.0–100.0)
RBC: 4.5 MIL/uL (ref 3.87–5.11)

## 2011-09-23 LAB — BASIC METABOLIC PANEL
BUN: 11 mg/dL (ref 6–23)
CO2: 24 mEq/L (ref 19–32)
Calcium: 9.3 mg/dL (ref 8.4–10.5)
Chloride: 104 mEq/L (ref 96–112)
Creatinine, Ser: 0.67 mg/dL (ref 0.50–1.10)
Glucose, Bld: 96 mg/dL (ref 70–99)

## 2011-09-23 LAB — RAPID URINE DRUG SCREEN, HOSP PERFORMED
Amphetamines: NOT DETECTED
Barbiturates: NOT DETECTED
Tetrahydrocannabinol: NOT DETECTED

## 2011-09-23 MED ORDER — DIPHENHYDRAMINE HCL 50 MG/ML IJ SOLN
25.0000 mg | Freq: Once | INTRAMUSCULAR | Status: AC
  Administered 2011-09-23: 25 mg via INTRAVENOUS
  Filled 2011-09-23: qty 1

## 2011-09-23 MED ORDER — ONDANSETRON HCL 4 MG/2ML IJ SOLN
4.0000 mg | Freq: Once | INTRAMUSCULAR | Status: AC
  Administered 2011-09-23: 4 mg via INTRAVENOUS
  Filled 2011-09-23: qty 2

## 2011-09-23 MED ORDER — LORAZEPAM 2 MG/ML IJ SOLN
1.0000 mg | Freq: Once | INTRAMUSCULAR | Status: AC
  Administered 2011-09-23: 1 mg via INTRAVENOUS
  Filled 2011-09-23: qty 1

## 2011-09-23 MED ORDER — MORPHINE SULFATE 4 MG/ML IJ SOLN
4.0000 mg | Freq: Once | INTRAMUSCULAR | Status: AC
  Administered 2011-09-23: 4 mg via INTRAVENOUS
  Filled 2011-09-23: qty 1

## 2011-09-23 NOTE — ED Notes (Signed)
Telepsych  Physician Dr. Berlin Hun called Psych ED and stated that they wereready for telepsych.

## 2011-09-23 NOTE — ED Notes (Signed)
Pt called 911 due to chest pain/tightness and anxiety. Pt came in by PTAR. Pt states "I began feeling weird and was hearing noises and ringing in my ears.". Pts anxiety grew worse.

## 2011-09-23 NOTE — ED Notes (Signed)
Dr. Berlin Hun called and informed of tension and holding breath, negative drug screen and that the patient had started Paxil yesterday and could this be reaction of starting paxil as per EDPA

## 2011-09-23 NOTE — ED Notes (Signed)
PT is tearful and reports that "my body feels tight all over." Pt also reports that her chest hurts. Pt says that she has been hearing roaring loud sounds that sometimes "sound like sirens". EKG is being done.

## 2011-09-23 NOTE — ED Notes (Signed)
Patient transported to CT 

## 2011-09-23 NOTE — ED Provider Notes (Signed)
Medical screening examination/treatment/procedure(s) were conducted as a shared visit with non-physician practitioner(s) and myself.  I personally evaluated the patient during the encounter  Her symptoms seem secondary to initiating paxil. She's had improvement in her symptoms with benadryl and ativan. There is some of this that seems dystonic in nature. Labs and CT head were normal. Anxiety is playing a large role in this. Nothing to suggest seizure. Please see psychiatric consultation for full detail. recs include cutting her paxil dose in half. She will follow up with her psychiatrist. Dc home in good condition  Lyanne Co, MD 09/23/11 2019

## 2011-09-23 NOTE — Discharge Instructions (Signed)
Your symptoms is likely related to the new medication, Paxil.  Please cut your prescribed dose in half.  Your symptoms will improve over time.  Follow up with your provider for reevaluation, return if your symptoms worsen.   RESOURCE GUIDE  Dental Problems  Patients with Medicaid: [facility]                     [phone] [address]                                           Phone:  [phone]                                                  If unable to pay or uninsured, contact:  [facility] or [facility]. to become qualified for the adult dental clinic.  Chronic Pain Problems Contact [facility]  [phone] Patients need to be referred by their primary care doctor.  Insufficient Money for Medicine Contact [employer] Way:  call "211" or [facility] (838)351-2035.  No Primary Care Doctor Call [facility]  ([phone] Other agencies that provide inexpensive medical care    [facility]  [phone]    [facility]  [phone]    [facility]  [phone]    [facility]  [phone]    [facility]  [phone]    [facility]  [phone]  Substance Abuse [facility]  9144500334 [facility] [phone] [facility] [place] [phone] Floydene Flock [phone] Residential & Outpatient Substance Abuse Program  (731)465-4274  Psychological Services [facility]  ([phone] [facility]  ([phone] [facility]   ([phone] (emergency services (848)059-7068)  Abuse/Neglect [facility] Child Abuse Hotline (909) 879-4713 [facility] Child Abuse Hotline 5711555128 (After Hours)  Emergency Shelter [facility] (838)013-4661  M[facility] [place] [facility] [place] (254) [phone] [facility] (209)323-2352  MRSA Hotline #:   (620)259-6069    [facility]  Resources  [facility] [place]  [employer]                           [facility] Dept. [address] [place]                     [address]         [address]  [place]                                               Cristobal Goldmann Phone:  [phone]                                  Phone:  [phone]                   Phone:  [phone]  [facility] Mental Health Phone:  [phone]  [facility] Child Abuse Hotline (618)244-7073 949-360-2507 (After Hours)

## 2011-09-23 NOTE — ED Provider Notes (Signed)
History     CSN: [id number]  Arrival date & time 09/23/11  7829   First MD Initiated Contact with Patient 09/23/11 1004      Chief Complaint  Patient presents with  . Anxiety  . Chest Pain    (Consider location/radiation/quality/duration/timing/severity/associated sxs/prior treatment) HPI  21 year old female presents to the ED with chief complaints of anxiety. Patient states this morning while sitting in bed she experiencing loud noises to both of the ears. She describes noise as "siren" sound and very loud.  There was lasted for about 5 minutes and dissipate. During that episode she also experiencing increased heart rate, chest pain and tightness, tightness throughout the body, and feeling clammy. Her episode can last from seconds to minutes. These episodes are intermittent. She is experiencing increased anxiety and therefore called EMS.  Patient denies ever having these symptoms before. States she was her normal self last night. She denies any precipitating factors, denies any alleviating factors. She does admits to starting a new medication, Paxil, since yesterday for her depression. However, she denies increased stress, SI/HI, auditory of visual hallucination. Denies taking salicylates or other OTC medications.  Patient denies fever, nausea, vomiting, diarrhea, abdominal pain, back pain, dysuria, rash. She denies taking birth control pills, having recent surgery, or having prolonged bed rest. Denies leg swelling or calf tenderness. Patient denies alcohol use or recreational drug use. She is a smoker.  Past Medical History  Diagnosis Date  . Asthma   . Herpes simplex of female genitalia     last outbreak 4 months ago    Past Surgical History  Procedure Date  . No past surgeries     No family history on file.  History  Substance Use Topics  . Smoking status: Current Everyday Smoker -- 0.2 packs/day    Types: Cigarettes  . Smokeless tobacco: Not on file  . Alcohol Use: No     OB History    Grav Para Term Preterm Abortions TAB SAB Ect Mult Living   2 2 2  0 0 0 0 0 0 2      Review of Systems  All other systems reviewed and are negative.    Allergies  Review of patient's allergies indicates no known allergies.  Home Medications   Current Outpatient Rx  Name Route Sig Dispense Refill  . ALBUTEROL SULFATE HFA 108 (90 BASE) MCG/ACT IN AERS Inhalation Inhale 2 puffs into the lungs every 6 (six) hours as needed. For shortness of breath    . HYDROCOD POLST-CPM POLST ER 10-8 MG/5ML PO LQCR Oral Take 5 mLs by mouth every 12 (twelve) hours. 140 mL 0    BP 132/83  Pulse 115  Temp(Src) 98.8 F (37.1 C) (Oral)  Resp 20  SpO2 99%  Physical Exam  Nursing note and vitals reviewed. Constitutional: She appears well-developed and well-nourished. No distress.       Awake, alert, nontoxic appearance. Patient is tearful  HENT:  Head: Atraumatic.  Right Ear: External ear normal. Tympanic membrane is not perforated. No middle ear effusion. No decreased hearing is noted.  Left Ear: External ear normal. Tympanic membrane is not perforated.  No middle ear effusion. No decreased hearing is noted.  Mouth/Throat: Oropharynx is clear and moist. No oropharyngeal exudate.  Eyes: Conjunctivae and EOM are normal. Pupils are equal, round, and reactive to light. Right eye exhibits no discharge. Left eye exhibits no discharge.  Neck: Neck supple.  Cardiovascular: Normal rate and regular rhythm.   Pulmonary/Chest: Effort normal. No  respiratory distress. She exhibits no tenderness.       Mild tachycardia noted  Abdominal: Soft. There is no tenderness. There is no rebound.  Musculoskeletal: Normal range of motion. She exhibits no tenderness.       ROM appears intact, no obvious focal weakness  Neurological: She is alert.       Mental status and motor strength appears intact  Skin: Skin is warm. No rash noted.  Psychiatric: She has a normal mood and affect.    ED Course   Procedures (including critical care time)  Labs Reviewed - No data to display No results found.   No diagnosis found.   Date: 09/23/2011  Rate: 89  Rhythm: normal sinus rhythm  QRS Axis: normal  Intervals: normal  ST/T Wave abnormalities: normal  Conduction Disutrbances:none  Narrative Interpretation:   Old EKG Reviewed: none available  Results for orders placed during the hospital encounter of 09/23/11  CBC      Component Value Range   WBC 9.6  4.0 - 10.5 (K/uL)   RBC 4.50  3.87 - 5.11 (MIL/uL)   Hemoglobin 14.5  12.0 - 15.0 (g/dL)   HCT 16.1  09.6 - 04.5 (%)   MCV 91.1  78.0 - 100.0 (fL)   MCH 32.2  26.0 - 34.0 (pg)   MCHC 35.4  30.0 - 36.0 (g/dL)   RDW 40.9  81.1 - 91.4 (%)   Platelets 289  150 - 400 (K/uL)  BASIC METABOLIC PANEL      Component Value Range   Sodium 138  135 - 145 (mEq/L)   Potassium 3.8  3.5 - 5.1 (mEq/L)   Chloride 104  96 - 112 (mEq/L)   CO2 24  19 - 32 (mEq/L)   Glucose, Bld 96  70 - 99 (mg/dL)   BUN 11  6 - 23 (mg/dL)   Creatinine, Ser 7.82  0.50 - 1.10 (mg/dL)   Calcium 9.3  8.4 - 95.6 (mg/dL)   GFR calc non Af Amer >90  >90 (mL/min)   GFR calc Af Amer >90  >90 (mL/min)  URINE RAPID DRUG SCREEN (HOSP PERFORMED)      Component Value Range   Opiates NONE DETECTED  NONE DETECTED    Cocaine NONE DETECTED  NONE DETECTED    Benzodiazepines NONE DETECTED  NONE DETECTED    Amphetamines NONE DETECTED  NONE DETECTED    Tetrahydrocannabinol NONE DETECTED  NONE DETECTED    Barbiturates NONE DETECTED  NONE DETECTED   PREGNANCY, URINE      Component Value Range   Preg Test, Ur NEGATIVE  NEGATIVE    Ct Head Wo Contrast  09/23/2011  *RADIOLOGY REPORT*  Clinical Data: Chest pain, anxiety, auditory abnormalities  CT HEAD WITHOUT CONTRAST  Technique:  Contiguous axial images were obtained from the base of the skull through the vertex without contrast.  Comparison: 07/07/2010  Findings: There is no evidence of acute intracranial hemorrhage, brain edema,  mass lesion, acute infarction,   mass effect, or midline shift. Acute infarct may be inapparent on noncontrast CT. No other intra-axial abnormalities are seen, and the ventricles and sulci are within normal limits in size and symmetry.   No abnormal extra-axial fluid collections or masses are identified.  No significant calvarial abnormality.  IMPRESSION: 1. Negative for bleed or other acute intracranial process.  Original Report Authenticated By: Osa Craver, M.D.      MDM  New onset of anxiety and tinnitus. Patient just recently started on Paxil,  questionable drug reaction. EKG essentially normal. Physical examination is unremarkable. Ativan PO given here in the ED  11:55 AM My attending has seen and evaluate the pt.  Pt exhibits some distonic reaction.  Benadryl 50mg  IM given.  Will continue to monitor.    12:26 PM Pt continues to endorse discomfort with muscle tightening, blurry vision, headache.  Work up initiated with head CT, UDS, UA, BMP, CBC.  Morphine and zofran given for sxs treatment.  Currently stable VS  1:36 PM Work up is neg.  Pt's symptoms are apparent when provider or nurse in room, however, appears to be comfortable and talking on phone when no one is around.  Will have telepsych consult for further evaluation.    2:51 PM On eval pt is in no acute distress.  Awaits telepsych.  3:36 PM Mult attempts to consult telepsych without return response.  Will continue to try to set up telepsych.  Pt doing well.    4:15 PM Pt has been evaluated by telepsych and the psychiatrist recommend cutting pt's dose of paxil in halves as her sxs is likely medication induced.  Pt agrees with plan.  My attending and I had reevaluate pt prior to discharge.  Strict f/u instruction given.    Fayrene Helper, PA-C 09/23/11 1617

## 2011-09-28 ENCOUNTER — Emergency Department (HOSPITAL_COMMUNITY)
Admission: EM | Admit: 2011-09-28 | Discharge: 2011-09-28 | Disposition: A | Discharge status: Home / Self Care | Payer: Self-pay | Attending: Emergency Medicine | Admitting: Emergency Medicine

## 2011-09-28 ENCOUNTER — Encounter (HOSPITAL_COMMUNITY): Payer: Self-pay

## 2011-09-28 ENCOUNTER — Other Ambulatory Visit: Payer: Self-pay

## 2011-09-28 DIAGNOSIS — R000 Tachycardia, unspecified: Secondary | ICD-10-CM | POA: Insufficient documentation

## 2011-09-28 DIAGNOSIS — J45909 Unspecified asthma, uncomplicated: Secondary | ICD-10-CM | POA: Insufficient documentation

## 2011-09-28 DIAGNOSIS — R209 Unspecified disturbances of skin sensation: Secondary | ICD-10-CM | POA: Insufficient documentation

## 2011-09-28 DIAGNOSIS — F411 Generalized anxiety disorder: Secondary | ICD-10-CM | POA: Insufficient documentation

## 2011-09-28 DIAGNOSIS — F410 Panic disorder [episodic paroxysmal anxiety] without agoraphobia: Secondary | ICD-10-CM | POA: Insufficient documentation

## 2011-09-28 MED ORDER — LORAZEPAM 1 MG PO TABS
1.0000 mg | ORAL_TABLET | Freq: Once | ORAL | Status: AC
  Administered 2011-09-28: 1 mg via ORAL
  Filled 2011-09-28: qty 1

## 2011-09-28 NOTE — Discharge Instructions (Signed)
Please read and follow all provided instructions.  Your diagnoses today include:  1. Anxiety attack     Tests performed today include:  Vital signs. See below for your results today.   Medications prescribed:   None  Home care instructions:  Follow any educational materials contained in this packet.  Follow-up instructions: Please follow-up with your psychiatric or primary care doctor in the next 3 days for further evaluation of your symptoms. If you do not have a primary care doctor -- see below for referral information.   Return instructions:   Please return to the Emergency Department if you experience worsening symptoms.   Please return if you have any other emergent concerns.  Additional Information:  Your vital signs today were: BP 126/75  Pulse 79  Temp(Src) 98.1 F (36.7 C) (Oral)  Resp 20  SpO2 98% If your blood pressure (BP) was elevated above 135/85 this visit, please have this repeated by your doctor within one month. -------------- No Primary Care Doctor Call [facility]  [phone] Other agencies that provide inexpensive medical care    [facility]  [phone]    [facility]  [phone]    [facility]  [phone]    [facility]  [phone]    [facility]  [phone]    [facility]  [phone] -------------- RESOURCE GUIDE:  Dental Problems  Patients with Medicaid: [facility] [id number]                                            [phone] [address] Phone:  [phone]                                                   Phone:  [phone]  If unable to pay or uninsured, contact:  [facility] or [facility]. to become qualified for the adult dental clinic.  Chronic Pain Problems Contact [facility]  [phone] Patients need to be referred by their primary care doctor.  Insufficient Money for  Medicine Contact [employer]:  call "211" or [facility] [phone].  Psychological Services [facility]  [phone] [facility]  [phone] [facility] Mental Health   [phone] (emergency services [phone])  Substance Abuse Resources [facility]  [phone] [facility] [phone] [facility] [phone] Floydene Flock [phone] Residential & Outpatient Substance Abuse Program  [phone]  Abuse/Neglect [facility] Child Abuse Hotline [phone] [facility] Child Abuse Hotline [phone] (After Hours)  Emergency Shelter [facility] [phone]  Maternity Homes Room at the [place] [facility] [place] [phone] [facility] [phone]  [facility] Resources  [facility]     [employer]                          [facility]. [address] [place]  [address]      [address]  Blondell Reveal Phone:  [phone]                                   Phone:  [phone]                 Phone:  [phone]  [facility] Phone:  [phone]  [facility] Child Abuse Hotline [phone] [phone] (After Hours)

## 2011-09-28 NOTE — ED Notes (Signed)
Pt. Reports having periods where it feels that her heart is racing,  Rt. Cheek feels numb and her lt hand feels numb

## 2011-09-28 NOTE — ED Notes (Signed)
Patient states that she is feeling much better.  States that she is ready to go home.  Josh, PA made aware of same.

## 2011-09-28 NOTE — ED Notes (Signed)
MD at bedside. 

## 2011-09-28 NOTE — ED Provider Notes (Signed)
History     CSN: [id number]  Arrival date & time 09/28/11  1041   First MD Initiated Contact with Patient 09/28/11 1117      Chief Complaint  Patient presents with  . Anxiety    Pt. was just diagnosed with anxiety and placed on Paxil last week for depression    (Consider location/radiation/quality/duration/timing/severity/associated sxs/prior treatment) HPI Comments: Patient recently started on Paxil approximately one week ago states that her anxiety is gotten worse. She was seen several days ago at [facility] emergency department and had a negative workup. Telepsychiatrist recommended decreasing her Paxil dose by one half. Her symptoms are still recurrent. She states she feels her heart racing, previous fast, and has numbness in her face and left hand.  Patient is a 21 y.o. female presenting with anxiety. The history is provided by the patient.  Anxiety This is a recurrent problem. The current episode started today. The problem has been unchanged. Associated symptoms include numbness (Paresthesias). Pertinent negatives include no abdominal pain, chest pain, coughing, fever, headaches, myalgias, nausea, rash, sore throat or vomiting. The symptoms are aggravated by nothing. She has tried nothing for the symptoms.    Past Medical History  Diagnosis Date  . Asthma   . Herpes simplex of female genitalia     last outbreak 4 months ago  . Anxiety     Past Surgical History  Procedure Date  . No past surgeries     No family history on file.  History  Substance Use Topics  . Smoking status: Current Everyday Smoker -- 0.2 packs/day    Types: Cigarettes  . Smokeless tobacco: Not on file  . Alcohol Use: No    OB History    Grav Para Term Preterm Abortions TAB SAB Ect Mult Living   2 2 2  0 0 0 0 0 0 2      Review of Systems  Constitutional: Negative for fever.  HENT: Negative for sore throat and rhinorrhea.   Eyes: Negative for redness.  Respiratory: Negative for cough and  shortness of breath.   Cardiovascular: Negative for chest pain.  Gastrointestinal: Negative for nausea, vomiting, abdominal pain and diarrhea.  Genitourinary: Negative for dysuria.  Musculoskeletal: Negative for myalgias.  Skin: Negative for rash.  Neurological: Positive for numbness (Paresthesias). Negative for headaches.    Allergies  Review of patient's allergies indicates no known allergies.  Home Medications   Current Outpatient Rx  Name Route Sig Dispense Refill  . ALBUTEROL SULFATE HFA 108 (90 BASE) MCG/ACT IN AERS Inhalation Inhale 2 puffs into the lungs every 6 (six) hours as needed. For shortness of breath    . LAMOTRIGINE 25 MG PO TABS Oral Take 25 mg by mouth 2 (two) times daily.    Marland Kitchen PAROXETINE HCL 10 MG PO TABS Oral Take 5 mg by mouth every morning.       BP 126/75  Pulse 79  Temp(Src) 98.1 F (36.7 C) (Oral)  Resp 20  SpO2 98%  Physical Exam  Nursing note and vitals reviewed. Constitutional: She is oriented to person, place, and time. She appears well-developed and well-nourished.  HENT:  Head: Normocephalic and atraumatic.  Eyes: Conjunctivae are normal. Right eye exhibits no discharge. Left eye exhibits no discharge.  Neck: Normal range of motion. Neck supple.  Cardiovascular: Normal rate, regular rhythm and normal heart sounds.   Pulmonary/Chest: Effort normal and breath sounds normal.  Abdominal: Soft. There is no tenderness.  Neurological: She is alert and oriented to person,  place, and time. She has normal strength. No cranial nerve deficit or sensory deficit. GCS eye subscore is 4. GCS verbal subscore is 5. GCS motor subscore is 6.  Skin: Skin is warm and dry.  Psychiatric: Her mood appears anxious.    ED Course  Procedures (including critical care time)  Labs Reviewed - No data to display No results found.   1. Anxiety attack     11:57 AM Patient seen and examined. Medications ordered.   Vital signs reviewed and are as follows: Filed  Vitals:   09/28/11 1046  BP: 126/75  Pulse: 79  Temp: 98.1 F (36.7 C)  Resp: 20   Patient seen prior to discharge. Her symptoms are resolved with ativan. She states that she threw the Paxil away and wishes not to take this. Given this, will not discharge home on any medications. I have urged her to follow-up with PCP/psychiatrist in next week to discuss treatment of her anxiety. Patient and friend/family agree with this plan and are comfortable with this. Discharge to home.   MDM  Patient with anxiety with paresthesias, no gross neurological deficit, improved with ativan. She has discontinued Paxil. Patient counseled. She appears well and stable at discharge.         [place], [place] 09/28/11 [phone]

## 2011-09-28 NOTE — ED Notes (Signed)
Patient states that she started taking any depressants on Monday. Pt states that she had an episode of anxiety on Tuesday.  Pt states that this morning she took her medicine and now has anxiety again.  Pt does appear to be anxious.  Pt is holding her chest and states that she has pain and SOB with same.  Pt is tearful at this time.  No respiratory distress noted at this time.

## 2011-09-28 NOTE — ED Provider Notes (Signed)
Medical screening examination/treatment/procedure(s) were performed by non-physician practitioner and as supervising physician I was immediately available for consultation/collaboration.    Nelia Shi, MD 09/28/11 2130

## 2011-11-25 ENCOUNTER — Emergency Department (HOSPITAL_COMMUNITY)
Admission: EM | Admit: 2011-11-25 | Discharge: 2011-11-25 | Discharge status: Against Medical Advice | Payer: Self-pay | Attending: Emergency Medicine | Admitting: Emergency Medicine

## 2011-11-25 ENCOUNTER — Encounter (HOSPITAL_COMMUNITY): Payer: Self-pay | Admitting: Emergency Medicine

## 2011-11-25 DIAGNOSIS — F411 Generalized anxiety disorder: Secondary | ICD-10-CM | POA: Insufficient documentation

## 2012-03-18 ENCOUNTER — Encounter (HOSPITAL_COMMUNITY): Payer: Self-pay | Admitting: Emergency Medicine

## 2012-03-18 ENCOUNTER — Emergency Department (HOSPITAL_COMMUNITY): Payer: Self-pay

## 2012-03-18 ENCOUNTER — Emergency Department (HOSPITAL_COMMUNITY)
Admission: EM | Admit: 2012-03-18 | Discharge: 2012-03-18 | Disposition: A | Discharge status: Home / Self Care | Payer: Self-pay | Attending: Emergency Medicine | Admitting: Emergency Medicine

## 2012-03-18 DIAGNOSIS — J45909 Unspecified asthma, uncomplicated: Secondary | ICD-10-CM | POA: Insufficient documentation

## 2012-03-18 DIAGNOSIS — M25519 Pain in unspecified shoulder: Secondary | ICD-10-CM | POA: Insufficient documentation

## 2012-03-18 DIAGNOSIS — M542 Cervicalgia: Secondary | ICD-10-CM | POA: Insufficient documentation

## 2012-03-18 DIAGNOSIS — F17200 Nicotine dependence, unspecified, uncomplicated: Secondary | ICD-10-CM | POA: Insufficient documentation

## 2012-03-18 DIAGNOSIS — F329 Major depressive disorder, single episode, unspecified: Secondary | ICD-10-CM | POA: Insufficient documentation

## 2012-03-18 DIAGNOSIS — F3289 Other specified depressive episodes: Secondary | ICD-10-CM | POA: Insufficient documentation

## 2012-03-18 DIAGNOSIS — F411 Generalized anxiety disorder: Secondary | ICD-10-CM | POA: Insufficient documentation

## 2012-03-18 MED ORDER — PREDNISONE 20 MG PO TABS: ORAL_TABLET | ORAL | Status: DC

## 2012-03-18 MED ORDER — HYDROCODONE-ACETAMINOPHEN 5-500 MG PO TABS: 1.0000 | ORAL_TABLET | Freq: Four times a day (QID) | ORAL | Status: DC | PRN

## 2012-03-18 MED ORDER — PREDNISONE 20 MG PO TABS
60.0000 mg | ORAL_TABLET | Freq: Once | ORAL | Status: AC
  Administered 2012-03-18: 60 mg via ORAL
  Filled 2012-03-18: qty 3

## 2012-03-18 NOTE — ED Notes (Signed)
PT. 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 DENIES CHEST PAIN OR SOB.

## 2012-03-18 NOTE — ED Provider Notes (Signed)
History     CSN: [id number]  Arrival date & time 03/18/12  4098   First MD Initiated Contact with Patient 03/18/12 0700      Chief Complaint  Patient presents with  . Shoulder Pain    (Consider location/radiation/quality/duration/timing/severity/associated sxs/prior treatment) Patient is a 21 y.o. female presenting with shoulder pain. The history is provided by the patient.  Shoulder Pain Pertinent negatives include no chest pain, no headaches and no shortness of breath.  pt c/o right neck pain for the past 1-2 weeks. Dull, constant, worse w certain positions of neck. No headache. No specific injury recalled. Works as Child psychotherapist. Pain radiates from right neck to shoulder. Occasionally tingling/numbness sensation to right arm. No weakness. No loss of rom or dexterity. No pain w rom shoulder. No fever or chills. No hx ddd. otc meds not helping pain.     Past Medical History  Diagnosis Date  . Asthma   . Herpes simplex of female genitalia     last outbreak 4 months ago  . Anxiety   . Depression     Past Surgical History  Procedure Date  . No past surgeries     No family history on file.  History  Substance Use Topics  . Smoking status: Current Every Day Smoker -- 0.2 packs/day    Types: Cigarettes  . Smokeless tobacco: Not on file  . Alcohol Use: No    OB History    Grav Para Term Preterm Abortions TAB SAB Ect Mult Living   2 2 2  0 0 0 0 0 0 2      Review of Systems  Constitutional: Negative for fever.  HENT: Positive for neck pain.   Respiratory: Negative for shortness of breath.   Cardiovascular: Negative for chest pain.  Neurological: Negative for weakness and headaches.    Allergies  Review of patient's allergies indicates no known allergies.  Home Medications   Current Outpatient Rx  Name Route Sig Dispense Refill  . ACETAMINOPHEN 500 MG PO TABS Oral Take 2,000 mg by mouth every 6 (six) hours as needed.    . ARIPIPRAZOLE 10 MG PO TABS Oral Take 10  mg by mouth daily.    Marland Kitchen ESCITALOPRAM OXALATE 10 MG PO TABS Oral Take 10 mg by mouth daily.    Marland Kitchen NAPROXEN SODIUM 220 MG PO TABS Oral Take 660 mg by mouth 2 (two) times daily with a meal.    . ALBUTEROL SULFATE HFA 108 (90 BASE) MCG/ACT IN AERS Inhalation Inhale 2 puffs into the lungs every 6 (six) hours as needed. For shortness of breath      BP 123/76  Pulse 65  Temp 98.8 F (37.1 C) (Oral)  Resp 14  SpO2 100%  LMP 03/17/2012  Physical Exam  Nursing note and vitals reviewed. Constitutional: She is oriented to person, place, and time. She appears well-developed and well-nourished. No distress.  HENT:  Mouth/Throat: Oropharynx is clear and moist.  Eyes: Conjunctivae normal are normal. No scleral icterus.  Neck: Neck supple. No tracheal deviation present.  Cardiovascular: Normal rate.   Pulmonary/Chest: Effort normal. No respiratory distress.  Abdominal: Normal appearance.  Musculoskeletal: She exhibits no edema.       Right neck and trapezius muscle tenderness.  CTLS spine, non tender, aligned, no step off. Good rom right shoulder without pain. Radial pulse 2+.  Neurological: She is alert and oriented to person, place, and time.       Motor intact bil.  Steady gait. R/m/u n  fxn intact.   Skin: Skin is warm and dry. No rash noted.  Psychiatric: She has a normal mood and affect.    ED Course  Procedures (including critical care time)  Dg Cervical Spine Complete  03/18/2012  *RADIOLOGY REPORT*  Clinical Data: Right neck pain  CERVICAL SPINE - COMPLETE 4+ VIEW  Comparison: 07/07/2010 CT  Findings: The imaged vertebral bodies and inter-vertebral disc spaces are maintained. No displaced acute fracture or dislocation identified.   The para-vertebral and overlying soft tissues are within normal limits.  Lung apices are clear.  IMPRESSION: Normal radiographic appearance of the cervical spine.   Original Report Authenticated By: Waneta Martins, M.D.       MDM  Xray. Pt states otc  meds not helping. Will give course pred, vicodin.         Suzi Roots, MD 03/18/12 [phone]

## 2012-05-03 ENCOUNTER — Inpatient Hospital Stay (HOSPITAL_COMMUNITY): Payer: Self-pay

## 2012-05-03 ENCOUNTER — Inpatient Hospital Stay (HOSPITAL_COMMUNITY)
Admission: AD | Admit: 2012-05-03 | Discharge: 2012-05-03 | Disposition: A | Discharge status: Home / Self Care | Payer: Self-pay | Source: Ambulatory Visit | Attending: Obstetrics & Gynecology | Admitting: Obstetrics & Gynecology

## 2012-05-03 ENCOUNTER — Encounter (HOSPITAL_COMMUNITY): Payer: Self-pay | Admitting: *Deleted

## 2012-05-03 DIAGNOSIS — R112 Nausea with vomiting, unspecified: Secondary | ICD-10-CM | POA: Insufficient documentation

## 2012-05-03 DIAGNOSIS — R102 Pelvic and perineal pain: Secondary | ICD-10-CM

## 2012-05-03 DIAGNOSIS — A499 Bacterial infection, unspecified: Secondary | ICD-10-CM | POA: Insufficient documentation

## 2012-05-03 DIAGNOSIS — N760 Acute vaginitis: Secondary | ICD-10-CM | POA: Insufficient documentation

## 2012-05-03 DIAGNOSIS — B9689 Other specified bacterial agents as the cause of diseases classified elsewhere: Secondary | ICD-10-CM | POA: Insufficient documentation

## 2012-05-03 DIAGNOSIS — N949 Unspecified condition associated with female genital organs and menstrual cycle: Secondary | ICD-10-CM | POA: Insufficient documentation

## 2012-05-03 LAB — COMPREHENSIVE METABOLIC PANEL
ALT: 20 U/L (ref 0–35)
Alkaline Phosphatase: 56 U/L (ref 39–117)
BUN: 12 mg/dL (ref 6–23)
CO2: 24 mEq/L (ref 19–32)
Chloride: 102 mEq/L (ref 96–112)
GFR calc Af Amer: >90 mL/min (ref >90)
Glucose, Bld: 99 mg/dL (ref 70–99)
Potassium: 3.5 mEq/L (ref 3.5–5.1)
Total Bilirubin: 1 mg/dL (ref 0.3–1.2)

## 2012-05-03 LAB — CBC
HCT: 36.4 % (ref 36.0–46.0)
Hemoglobin: 13.1 g/dL (ref 12.0–15.0)
RBC: 3.97 MIL/uL (ref 3.87–5.11)
WBC: 7.2 10*3/uL (ref 4.0–10.5)

## 2012-05-03 LAB — URINALYSIS, ROUTINE W REFLEX MICROSCOPIC
Bilirubin Urine: NEGATIVE
Leukocytes, UA: NEGATIVE
Nitrite: NEGATIVE
Specific Gravity, Urine: 1.02 (ref 1.005–1.030)
pH: 7.5 (ref 5.0–8.0)

## 2012-05-03 LAB — POCT PREGNANCY, URINE: Preg Test, Ur: NEGATIVE

## 2012-05-03 LAB — WET PREP, GENITAL

## 2012-05-03 LAB — URINE MICROSCOPIC-ADD ON

## 2012-05-03 MED ORDER — RANITIDINE HCL 150 MG PO TABS: 150.0000 mg | ORAL_TABLET | Freq: Two times a day (BID) | ORAL | Status: DC

## 2012-05-03 MED ORDER — ONDANSETRON 8 MG PO TBDP
8.0000 mg | ORAL_TABLET | Freq: Once | ORAL | Status: AC
  Administered 2012-05-03: 8 mg via ORAL
  Filled 2012-05-03: qty 1

## 2012-05-03 MED ORDER — KETOROLAC TROMETHAMINE 60 MG/2ML IM SOLN
60.0000 mg | Freq: Once | INTRAMUSCULAR | Status: AC
  Administered 2012-05-03: 60 mg via INTRAMUSCULAR
  Filled 2012-05-03: qty 2

## 2012-05-03 MED ORDER — METRONIDAZOLE 500 MG PO TABS: 500.0000 mg | ORAL_TABLET | Freq: Two times a day (BID) | ORAL | Status: DC

## 2012-05-03 MED ORDER — PROMETHAZINE HCL 25 MG PO TABS: 25.0000 mg | ORAL_TABLET | Freq: Four times a day (QID) | ORAL | Status: DC | PRN

## 2012-05-03 NOTE — MAU Note (Signed)
Vomitting for 2 weeks, dizzy, lightheaded.  Lower abd cramping for past 5 days.  Pt denies bleeding or discharge.

## 2012-05-03 NOTE — MAU Provider Note (Signed)
History     CSN: [id number]  Arrival date and time: 05/03/12 1328   None     Chief Complaint  Patient presents with  . Abdominal Pain  . Emesis   HPI 21 y.o. [id number] with n/v and pelvic pain x 2 weeks, worsening. Vomiting everyday, can't keep anything down. Hasn't eaten today, yesterday had a chicken burrito from a [place]. Pelvic pain is crampy in nature. No discharge or bleeding. Has mirena IUD x 4 years.    Past Medical History  Diagnosis Date  . Asthma   . Herpes simplex of female genitalia     last outbreak 4 months ago  . Anxiety   . Depression     Past Surgical History  Procedure Date  . No past surgeries     History reviewed. No pertinent family history.  History  Substance Use Topics  . Smoking status: Current Every Day Smoker -- 0.2 packs/day    Types: Cigarettes  . Smokeless tobacco: Not on file  . Alcohol Use: No    Allergies: No Known Allergies  Prescriptions prior to admission  Medication Sig Dispense Refill  . escitalopram (LEXAPRO) 10 MG tablet Take 10 mg by mouth daily.      Marland Kitchen lurasidone (LATUDA) 40 MG TABS Take 40 mg by mouth daily with breakfast.        Review of Systems  Constitutional: Negative.  Negative for fever and chills.  Respiratory: Negative.   Cardiovascular: Negative.   Gastrointestinal: Positive for nausea, vomiting and abdominal pain. Negative for diarrhea and constipation.  Genitourinary: Negative for dysuria, urgency, frequency, hematuria and flank pain.       Negative for vaginal bleeding, vaginal discharge  Musculoskeletal: Negative.   Neurological: Negative.   Psychiatric/Behavioral: Negative.    Physical Exam   Blood pressure 119/76, pulse 64, temperature 97.3 F (36.3 C), temperature source Oral, resp. rate 16, height 5\' 1"  (1.549 m), weight 138 lb 9.6 oz (62.869 kg).  Physical Exam  Nursing note and vitals reviewed. Constitutional: She is oriented to person, place, and time. She appears  well-developed and well-nourished. No distress.  HENT:  Head: Normocephalic and atraumatic.  Cardiovascular: Normal rate.   Respiratory: Effort normal.  GI: Soft. She exhibits no distension and no mass. There is no tenderness. There is no rebound and no guarding.  Genitourinary: There is no rash or lesion on the right labia. There is no rash or lesion on the left labia. Uterus is tender. Uterus is not enlarged. Cervix exhibits no motion tenderness, no discharge and no friability. Right adnexum displays tenderness. Right adnexum displays no mass and no fullness. Left adnexum displays no mass, no tenderness and no fullness. No tenderness or bleeding around the vagina. Vaginal discharge (white, thin) found.  Musculoskeletal: Normal range of motion.  Neurological: She is alert and oriented to person, place, and time.  Skin: Skin is warm and dry.  Psychiatric: She has a normal mood and affect.    MAU Course  Procedures  Results for orders placed during the hospital encounter of 05/03/12 (from the past 24 hour(s))  URINALYSIS, ROUTINE W REFLEX MICROSCOPIC     Status: Abnormal   Collection Time   05/03/12  1:55 PM      Component Value Range   Color, Urine YELLOW  YELLOW   APPearance HAZY (*) CLEAR   Specific Gravity, Urine 1.020  1.005 - 1.030   pH 7.5  5.0 - 8.0   Glucose, UA NEGATIVE  NEGATIVE mg/dL  Hgb urine dipstick MODERATE (*) NEGATIVE   Bilirubin Urine NEGATIVE  NEGATIVE   Ketones, ur 15 (*) NEGATIVE mg/dL   Protein, ur NEGATIVE  NEGATIVE mg/dL   Urobilinogen, UA 1.0  0.0 - 1.0 mg/dL   Nitrite NEGATIVE  NEGATIVE   Leukocytes, UA NEGATIVE  NEGATIVE  URINE MICROSCOPIC-ADD ON     Status: Abnormal   Collection Time   05/03/12  1:55 PM      Component Value Range   Squamous Epithelial / LPF FEW (*) RARE   RBC / HPF 3-6  <3 RBC/hpf   Urine-Other MUCOUS PRESENT    POCT PREGNANCY, URINE     Status: Normal   Collection Time   05/03/12  2:04 PM      Component Value Range   Preg Test,  Ur NEGATIVE  NEGATIVE  WET PREP, GENITAL     Status: Abnormal   Collection Time   05/03/12  2:17 PM      Component Value Range   Yeast Wet Prep HPF POC NONE SEEN  NONE SEEN   Trich, Wet Prep NONE SEEN  NONE SEEN   Clue Cells Wet Prep HPF POC MODERATE (*) NONE SEEN   WBC, Wet Prep HPF POC MODERATE (*) NONE SEEN  CBC     Status: Normal   Collection Time   05/03/12  3:40 PM      Component Value Range   WBC 7.2  4.0 - 10.5 K/uL   RBC 3.97  3.87 - 5.11 MIL/uL   Hemoglobin 13.1  12.0 - 15.0 g/dL   HCT 16.1  09.6 - 04.5 %   MCV 91.7  78.0 - 100.0 fL   MCH 33.0  26.0 - 34.0 pg   MCHC 36.0  30.0 - 36.0 g/dL   RDW 40.9  81.1 - 91.4 %   Platelets 254  150 - 400 K/uL  COMPREHENSIVE METABOLIC PANEL     Status: Normal   Collection Time   05/03/12  3:40 PM      Component Value Range   Sodium 135  135 - 145 mEq/L   Potassium 3.5  3.5 - 5.1 mEq/L   Chloride 102  96 - 112 mEq/L   CO2 24  19 - 32 mEq/L   Glucose, Bld 99  70 - 99 mg/dL   BUN 12  6 - 23 mg/dL   Creatinine, Ser 7.82  0.50 - 1.10 mg/dL   Calcium 8.8  8.4 - 95.6 mg/dL   Total Protein 7.3  6.0 - 8.3 g/dL   Albumin 4.0  3.5 - 5.2 g/dL   AST 23  0 - 37 U/L   ALT 20  0 - 35 U/L   Alkaline Phosphatase 56  39 - 117 U/L   Total Bilirubin 1.0  0.3 - 1.2 mg/dL   GFR calc non Af Amer >90  >90 mL/min   GFR calc Af Amer >90  >90 mL/min   US Transvaginal Non-ob  05/03/2012  *RADIOLOGY REPORT*  Clinical Data: Pelvic pain, right lower quadrant, previous IUD placement  TRANSABDOMINAL AND TRANSVAGINAL ULTRASOUND OF PELVIS Technique:  Both transabdominal and transvaginal ultrasound examinations of the pelvis were performed. Transabdominal technique was performed for global imaging of the pelvis including uterus, ovaries, adnexal regions, and pelvic cul-de-sac.  It was necessary to proceed with endovaginal exam following the transabdominal exam to visualize the IUD position.  Comparison:  05/15/2011  Findings:  Uterus: 7.7 x 5.1 x 3.3 cm.  Anteverted,  anteflexed.  No focal  abnormality.  Endometrium: 4 mm.  Uniformly thin and echogenic without focal abnormality.  IUD appropriately located within the uterine fundal/body endometrial canal.  Right ovary:  3.2 x 3.6 x 1.4 cm.  Normal.  Left ovary: 2.9 x 2.3 feet 2.0 cm.  Normal.  Other findings: No free fluid  IMPRESSION: Normal study. No evidence of pelvic mass or other significant abnormality.   Original Report Authenticated By: Christiana Pellant, M.D.    US Pelvis Complete  05/03/2012  *RADIOLOGY REPORT*  Clinical Data: Pelvic pain, right lower quadrant, previous IUD placement  TRANSABDOMINAL AND TRANSVAGINAL ULTRASOUND OF PELVIS Technique:  Both transabdominal and transvaginal ultrasound examinations of the pelvis were performed. Transabdominal technique was performed for global imaging of the pelvis including uterus, ovaries, adnexal regions, and pelvic cul-de-sac.  It was necessary to proceed with endovaginal exam following the transabdominal exam to visualize the IUD position.  Comparison:  05/15/2011  Findings:  Uterus: 7.7 x 5.1 x 3.3 cm.  Anteverted, anteflexed.  No focal abnormality.  Endometrium: 4 mm.  Uniformly thin and echogenic without focal abnormality.  IUD appropriately located within the uterine fundal/body endometrial canal.  Right ovary:  3.2 x 3.6 x 1.4 cm.  Normal.  Left ovary: 2.9 x 2.3 feet 2.0 cm.  Normal.  Other findings: No free fluid  IMPRESSION: Normal study. No evidence of pelvic mass or other significant abnormality.   Original Report Authenticated By: Christiana Pellant, M.D.     Assessment and Plan   1. Nausea and vomiting   2. Pelvic pain in female   3. BV (bacterial vaginosis)       Medication List     As of 05/03/2012  4:44 PM    START taking these medications         metroNIDAZOLE 500 MG tablet   Commonly known as: FLAGYL   Take 1 tablet (500 mg total) by mouth 2 (two) times daily.      promethazine 25 MG tablet   Commonly known as: PHENERGAN   Take 1 tablet (25  mg total) by mouth every 6 (six) hours as needed for nausea.      ranitidine 150 MG tablet   Commonly known as: ZANTAC   Take 1 tablet (150 mg total) by mouth 2 (two) times daily.      CONTINUE taking these medications         escitalopram 10 MG tablet   Commonly known as: LEXAPRO      lurasidone 40 MG Tabs   Commonly known as: LATUDA          Where to get your medications    These are the prescriptions that you need to pick up. We sent them to a specific pharmacy, so you will need to go there to get them.   [facility] [address], [place] - [address]    [address] [place] [address]    Phone: [phone]        metroNIDAZOLE 500 MG tablet   promethazine 25 MG tablet   ranitidine 150 MG tablet            Follow-up Information    Please follow up. (As needed or if symptoms don't improve)    Contact information:   A primary care doctor           [facility] 05/03/2012, 4:44 PM

## 2012-05-04 LAB — GC/CHLAMYDIA PROBE AMP, GENITAL
Chlamydia, DNA Probe: NEGATIVE
GC Probe Amp, Genital: NEGATIVE

## 2012-05-10 NOTE — MAU Provider Note (Signed)
Medical Screening exam and patient care preformed by advanced practice provider.  Agree with the above management.  

## 2012-08-25 ENCOUNTER — Emergency Department (HOSPITAL_COMMUNITY)
Admission: EM | Admit: 2012-08-25 | Discharge: 2012-08-25 | Disposition: A | Discharge status: Home / Self Care | Payer: Self-pay | Attending: Emergency Medicine | Admitting: Emergency Medicine

## 2012-08-25 ENCOUNTER — Encounter (HOSPITAL_COMMUNITY): Payer: Self-pay | Admitting: *Deleted

## 2012-08-25 DIAGNOSIS — B349 Viral infection, unspecified: Secondary | ICD-10-CM

## 2012-08-25 DIAGNOSIS — J029 Acute pharyngitis, unspecified: Secondary | ICD-10-CM | POA: Insufficient documentation

## 2012-08-25 DIAGNOSIS — F3289 Other specified depressive episodes: Secondary | ICD-10-CM | POA: Insufficient documentation

## 2012-08-25 DIAGNOSIS — B9789 Other viral agents as the cause of diseases classified elsewhere: Secondary | ICD-10-CM | POA: Insufficient documentation

## 2012-08-25 DIAGNOSIS — J069 Acute upper respiratory infection, unspecified: Secondary | ICD-10-CM

## 2012-08-25 DIAGNOSIS — K529 Noninfective gastroenteritis and colitis, unspecified: Secondary | ICD-10-CM

## 2012-08-25 DIAGNOSIS — R002 Palpitations: Secondary | ICD-10-CM | POA: Insufficient documentation

## 2012-08-25 DIAGNOSIS — F411 Generalized anxiety disorder: Secondary | ICD-10-CM | POA: Insufficient documentation

## 2012-08-25 DIAGNOSIS — F17200 Nicotine dependence, unspecified, uncomplicated: Secondary | ICD-10-CM | POA: Insufficient documentation

## 2012-08-25 DIAGNOSIS — J45909 Unspecified asthma, uncomplicated: Secondary | ICD-10-CM | POA: Insufficient documentation

## 2012-08-25 DIAGNOSIS — Z79899 Other long term (current) drug therapy: Secondary | ICD-10-CM | POA: Insufficient documentation

## 2012-08-25 DIAGNOSIS — K5289 Other specified noninfective gastroenteritis and colitis: Secondary | ICD-10-CM | POA: Insufficient documentation

## 2012-08-25 DIAGNOSIS — Z3202 Encounter for pregnancy test, result negative: Secondary | ICD-10-CM | POA: Insufficient documentation

## 2012-08-25 DIAGNOSIS — F329 Major depressive disorder, single episode, unspecified: Secondary | ICD-10-CM | POA: Insufficient documentation

## 2012-08-25 DIAGNOSIS — A6000 Herpesviral infection of urogenital system, unspecified: Secondary | ICD-10-CM | POA: Insufficient documentation

## 2012-08-25 LAB — CBC WITH DIFFERENTIAL/PLATELET
Basophils Absolute: 0 10*3/uL (ref 0.0–0.1)
Basophils Relative: 0 % (ref 0–1)
Eosinophils Relative: 4 % (ref 0–5)
Lymphocytes Relative: 23 % (ref 12–46)
MCHC: 36.9 g/dL — ABNORMAL HIGH (ref 30.0–36.0)
Neutro Abs: 5 10*3/uL (ref 1.7–7.7)
Platelets: 293 10*3/uL (ref 150–400)
RDW: 12.7 % (ref 11.5–15.5)
WBC: 7.2 10*3/uL (ref 4.0–10.5)

## 2012-08-25 LAB — URINALYSIS, ROUTINE W REFLEX MICROSCOPIC
Glucose, UA: NEGATIVE mg/dL
Leukocytes, UA: NEGATIVE
Protein, ur: NEGATIVE mg/dL
Specific Gravity, Urine: 1.021 (ref 1.005–1.030)
pH: 7.5 (ref 5.0–8.0)

## 2012-08-25 LAB — COMPREHENSIVE METABOLIC PANEL
ALT: 35 U/L (ref 0–35)
AST: 21 U/L (ref 0–37)
Albumin: 4.3 g/dL (ref 3.5–5.2)
CO2: 24 mEq/L (ref 19–32)
Calcium: 9.7 mg/dL (ref 8.4–10.5)
Chloride: 102 mEq/L (ref 96–112)
GFR calc non Af Amer: >90 mL/min (ref >90)
Sodium: 135 mEq/L (ref 135–145)

## 2012-08-25 LAB — POCT PREGNANCY, URINE: Preg Test, Ur: NEGATIVE

## 2012-08-25 LAB — URINE MICROSCOPIC-ADD ON

## 2012-08-25 MED ORDER — ONDANSETRON HCL 4 MG/2ML IJ SOLN
4.0000 mg | Freq: Once | INTRAMUSCULAR | Status: AC
  Administered 2012-08-25: 4 mg via INTRAVENOUS
  Filled 2012-08-25: qty 2

## 2012-08-25 MED ORDER — SODIUM CHLORIDE 0.9 % IV BOLUS (SEPSIS)
1000.0000 mL | Freq: Once | INTRAVENOUS | Status: AC
  Administered 2012-08-25: 1000 mL via INTRAVENOUS

## 2012-08-25 MED ORDER — GI COCKTAIL ~~LOC~~
30.0000 mL | Freq: Once | ORAL | Status: AC
  Administered 2012-08-25: 30 mL via ORAL
  Filled 2012-08-25: qty 30

## 2012-08-25 MED ORDER — BENZOCAINE 20 % MT SOLN: 1.0000 "application " | Freq: Three times a day (TID) | OROMUCOSAL | Status: DC | PRN

## 2012-08-25 MED ORDER — PROMETHAZINE HCL 25 MG PO TABS: 25.0000 mg | ORAL_TABLET | Freq: Four times a day (QID) | ORAL | Status: DC | PRN

## 2012-08-25 MED ORDER — OMEPRAZOLE 20 MG PO CPDR: 20.0000 mg | DELAYED_RELEASE_CAPSULE | Freq: Every day | ORAL | Status: DC

## 2012-08-25 MED ORDER — GUAIFENESIN ER 600 MG PO TB12: 1200.0000 mg | ORAL_TABLET | Freq: Two times a day (BID) | ORAL | Status: DC

## 2012-08-25 NOTE — ED Provider Notes (Signed)
History     CSN: [id number]  Arrival date & time 08/25/12  1231   First MD Initiated Contact with Patient 08/25/12 1510      Chief Complaint  Patient presents with  . Emesis  . rapid heart rate   . Shortness of Breath    (Consider location/radiation/quality/duration/timing/severity/associated sxs/prior treatment) The history is provided by the patient and medical records. No language interpreter was used.    Katrina IGOE is a 22 y.o. female  with a hx of asthma, anxiety, depression presents to the Emergency Department complaining of gradual, persistent, progressively worsening N/V/D onset 4 days ago without trigger. Associated symptoms include rhinorrhea, nasal congestion, sore throat, productive cough, subjective palpitations.  She has not tried any symptom management. Nothing makes it better and nothing makes it worse.  Pt vomits every time after PO intake, but PO intake is not associated with abdominal pain each time - pt is eating sub sandwiches and drinking water.  Pt denies fever, chills, malaise, chest pain, abdominal pain, lightheadedness, syncope.    Past Medical History  Diagnosis Date  . Asthma   . Herpes simplex of female genitalia     last outbreak 4 months ago  . Anxiety   . Depression     Past Surgical History  Procedure Laterality Date  . No past surgeries      No family history on file.  History  Substance Use Topics  . Smoking status: Current Every Day Smoker -- 0.25 packs/day    Types: Cigarettes  . Smokeless tobacco: Not on file  . Alcohol Use: No    OB History   Grav Para Term Preterm Abortions TAB SAB Ect Mult Living   2 2 2  0 0 0 0 0 0 2      Review of Systems  Constitutional: Negative for fever, diaphoresis, appetite change, fatigue and unexpected weight change.  HENT: Positive for congestion, sore throat, rhinorrhea, postnasal drip and sinus pressure. Negative for mouth sores and neck stiffness.   Eyes: Negative for visual  disturbance.  Respiratory: Positive for shortness of breath. Negative for cough, chest tightness and wheezing.   Cardiovascular: Negative for chest pain.  Gastrointestinal: Positive for nausea, vomiting and diarrhea. Negative for abdominal pain and constipation.  Endocrine: Negative for polydipsia, polyphagia and polyuria.  Genitourinary: Negative for dysuria, urgency, frequency and hematuria.  Musculoskeletal: Negative for back pain.  Skin: Negative for rash.  Allergic/Immunologic: Negative for immunocompromised state.  Neurological: Negative for syncope, light-headedness and headaches.  Hematological: Does not bruise/bleed easily.  Psychiatric/Behavioral: Negative for sleep disturbance. The patient is not nervous/anxious.   All other systems reviewed and are negative.    Allergies  Shellfish allergy  Home Medications   Current Outpatient Rx  Name  Route  Sig  Dispense  Refill  . albuterol (PROVENTIL HFA;VENTOLIN HFA) 108 (90 BASE) MCG/ACT inhaler   Inhalation   Inhale 2 puffs into the lungs every 6 (six) hours as needed for wheezing or shortness of breath.         . benzocaine (HURRICAINE) 20 % SOLN   Mouth/Throat   Use as directed 1 application in the mouth or throat 3 (three) times daily as needed.   9.75 mL   0   . guaiFENesin (MUCINEX) 600 MG 12 hr tablet   Oral   Take 2 tablets (1,200 mg total) by mouth 2 (two) times daily.   20 tablet   0   . omeprazole (PRILOSEC) 20 MG capsule  Oral   Take 1 capsule (20 mg total) by mouth daily.   30 capsule   0   . promethazine (PHENERGAN) 25 MG tablet   Oral   Take 1 tablet (25 mg total) by mouth every 6 (six) hours as needed for nausea.   12 tablet   0     BP 111/60  Pulse 68  Temp(Src) 98.5 F (36.9 C) (Oral)  Resp 24  SpO2 100%  Physical Exam  Nursing note and vitals reviewed. Constitutional: She is oriented to person, place, and time. She appears well-developed and well-nourished. No distress.  HENT:   Head: Normocephalic and atraumatic.  Right Ear: Tympanic membrane, external ear and ear canal normal.  Left Ear: Tympanic membrane, external ear and ear canal normal.  Nose: Mucosal edema and rhinorrhea present.  Mouth/Throat: Uvula is midline and mucous membranes are normal. Mucous membranes are not dry and not cyanotic. Posterior oropharyngeal erythema (mild) present. No oropharyngeal exudate, posterior oropharyngeal edema or tonsillar abscesses.  Eyes: Conjunctivae and EOM are normal. Pupils are equal, round, and reactive to light. No scleral icterus.  Neck: Normal range of motion. Neck supple.  Cardiovascular: Normal rate, regular rhythm, normal heart sounds and intact distal pulses.  Exam reveals no gallop and no friction rub.   No murmur heard. Pulmonary/Chest: Effort normal and breath sounds normal. No respiratory distress. She has no wheezes. She has no rales. She exhibits no tenderness.  Abdominal: Soft. Normal appearance and bowel sounds are normal. She exhibits no distension and no mass. There is no hepatosplenomegaly. There is tenderness in the epigastric area and left upper quadrant. There is no rigidity, no rebound, no guarding, no CVA tenderness, no tenderness at McBurney's point and negative Murphy's sign.  Musculoskeletal: Normal range of motion. She exhibits no edema.  Lymphadenopathy:    She has no cervical adenopathy.  Neurological: She is alert and oriented to person, place, and time. She exhibits normal muscle tone. Coordination normal.  Speech is clear and goal oriented Moves extremities without ataxia  Skin: Skin is warm and dry. No rash noted. She is not diaphoretic. No erythema.  Psychiatric: She has a normal mood and affect.    ED Course  Procedures (including critical care time)  Labs Reviewed  CBC WITH DIFFERENTIAL - Abnormal; Notable for the following:    MCHC 36.9 (*)    All other components within normal limits  COMPREHENSIVE METABOLIC PANEL - Abnormal;  Notable for the following:    Glucose, Bld 109 (*)    Total Protein 8.4 (*)    All other components within normal limits  URINALYSIS, ROUTINE W REFLEX MICROSCOPIC - Abnormal; Notable for the following:    APPearance HAZY (*)    Hgb urine dipstick TRACE (*)    All other components within normal limits  URINE MICROSCOPIC-ADD ON - Abnormal; Notable for the following:    Bacteria, UA MANY (*)    All other components within normal limits  URINE CULTURE  LIPASE, BLOOD  POCT PREGNANCY, URINE   No results found.   1. Gastroenteritis   2. Viral syndrome   3. Viral URI       MDM  Isidoro Donning presents with N/V/D.  Patient is nontoxic, nonseptic appearing, in no apparent distress.  Patient's pain and other symptoms adequately managed in emergency department.  Fluid bolus given.  Labs and vitals reviewed.  Patient does not meet the SIRS or Sepsis criteria.  On repeat exam patient does not have a surgical  abdomin and there are nor peritoneal signs.  Pt with complete resolution of pain after GI cocktail admin.  No concern for appendicitis, bowel obstruction, bowel perforation, cholecystitis, diverticulitis, PID or ectopic pregnancy.  Patient discharged home with symptomatic treatment and given strict instructions for follow-up with their primary care physician.  I have also discussed reasons to return immediately to the ER.  Patient expresses understanding and agrees with plan.   1. Medications: zofran, prilosec, usual home medications 2. Treatment: rest, drink plenty of fluids, take medications as prescribed, clear liquid diet 3. Follow Up: Please followup with your primary doctor for discussion of your diagnoses and further evaluation after today's visit; if you do not have a primary care doctor use the resource guide provided to find one;        Dierdre Forth, PA-C 08/25/12 1809

## 2012-08-25 NOTE — ED Provider Notes (Signed)
Medical screening examination/treatment/procedure(s) were performed by non-physician practitioner and as supervising physician I was immediately available for consultation/collaboration.   Richardean Canal, MD 08/25/12 [phone]

## 2012-08-25 NOTE — ED Notes (Signed)
Pt is here with vomiting since Friday, lightheaded, rapid heart rate, and sob.  Burning in abdomen/  LMP in early January

## 2012-08-25 NOTE — ED Notes (Signed)
Pt vomited. RN Doran Heater informed.

## 2012-08-25 NOTE — ED Notes (Signed)
Patient is alert and orientedx4.  Patient was explained discharge instructions and they understood them with no questions.  Sharlynn Oliphant is coming to transport patient home.

## 2012-08-27 LAB — URINE CULTURE
Colony Count: NO GROWTH
Culture: NO GROWTH

## 2012-09-20 ENCOUNTER — Emergency Department (HOSPITAL_COMMUNITY)
Admission: EM | Admit: 2012-09-20 | Discharge: 2012-09-20 | Disposition: A | Discharge status: Home / Self Care | Payer: Self-pay | Attending: Emergency Medicine | Admitting: Emergency Medicine

## 2012-09-20 ENCOUNTER — Encounter (HOSPITAL_COMMUNITY): Payer: Self-pay | Admitting: *Deleted

## 2012-09-20 DIAGNOSIS — J020 Streptococcal pharyngitis: Secondary | ICD-10-CM | POA: Insufficient documentation

## 2012-09-20 DIAGNOSIS — J45909 Unspecified asthma, uncomplicated: Secondary | ICD-10-CM | POA: Insufficient documentation

## 2012-09-20 DIAGNOSIS — R509 Fever, unspecified: Secondary | ICD-10-CM | POA: Insufficient documentation

## 2012-09-20 DIAGNOSIS — Z8659 Personal history of other mental and behavioral disorders: Secondary | ICD-10-CM | POA: Insufficient documentation

## 2012-09-20 DIAGNOSIS — Z8619 Personal history of other infectious and parasitic diseases: Secondary | ICD-10-CM | POA: Insufficient documentation

## 2012-09-20 DIAGNOSIS — F17200 Nicotine dependence, unspecified, uncomplicated: Secondary | ICD-10-CM | POA: Insufficient documentation

## 2012-09-20 MED ORDER — ONDANSETRON HCL 4 MG/2ML IJ SOLN
4.0000 mg | Freq: Once | INTRAMUSCULAR | Status: AC
  Administered 2012-09-20: 4 mg via INTRAVENOUS
  Filled 2012-09-20: qty 2

## 2012-09-20 MED ORDER — SODIUM CHLORIDE 0.9 % IV BOLUS (SEPSIS)
1000.0000 mL | Freq: Once | INTRAVENOUS | Status: AC
  Administered 2012-09-20: 1000 mL via INTRAVENOUS

## 2012-09-20 MED ORDER — PROMETHAZINE HCL 25 MG PO TABS: 25.0000 mg | ORAL_TABLET | Freq: Four times a day (QID) | ORAL | Status: DC | PRN

## 2012-09-20 MED ORDER — PENICILLIN G BENZATHINE 1200000 UNIT/2ML IM SUSP
1.2000 10*6.[IU] | Freq: Once | INTRAMUSCULAR | Status: AC
  Administered 2012-09-20: 1.2 10*6.[IU] via INTRAMUSCULAR
  Filled 2012-09-20: qty 2

## 2012-09-20 MED ORDER — ACETAMINOPHEN 325 MG PO TABS
650.0000 mg | ORAL_TABLET | Freq: Once | ORAL | Status: AC
  Administered 2012-09-20: 650 mg via ORAL
  Filled 2012-09-20: qty 2

## 2012-09-20 MED ORDER — IBUPROFEN 800 MG PO TABS
800.0000 mg | ORAL_TABLET | Freq: Once | ORAL | Status: AC
  Administered 2012-09-20: 800 mg via ORAL
  Filled 2012-09-20: qty 1

## 2012-09-20 NOTE — ED Notes (Signed)
[id number]<AV> Expected date:<BR> Expected time:<BR> Means of arrival:<BR> Comments:<BR> Fever, generalized pain

## 2012-09-20 NOTE — ED Notes (Signed)
At attempt to discharge pt, VS noted to be abnormal. Temp elevated and HR elevated. Peter PA-C notified and orders given to keep pt and start IV. Orders completed. Pt noted to be vomiting upon arrival to room with medication. Pt medicated with nausea medication. Will administer tylenol when pt is able to tolerate PO.

## 2012-09-20 NOTE — ED Notes (Signed)
Pt arrives from home by [facility] c/o fever/HA/chills. Also reports falling last night and hitting the back of her head. Pt ambulatory.

## 2012-09-20 NOTE — ED Provider Notes (Signed)
History     CSN: [id number]  Arrival date & time 09/20/12  1912   First MD Initiated Contact with Patient 09/20/12 1935      Chief Complaint  Patient presents with  . Headache  . Fever  . Chills  . Sore Throat   HPI  History provided by the patient. Patient is a 22 year old female with history of asthma who presents with complaints of fever, body aches, headache and sore throat. Patient reports that symptoms first began last night feeling feverish with body aches. She can have worsening sore throat symptoms worse with any swallowing. She has had decreased appetite and slight nausea due to the sore throat. Patient also complains of generalized headache and does mention having small slip and fall 2 days ago while reaching the top of her stairs. She states she stumbled on last up and hit the right side of her head. She did not have any LOC. She has some soreness to the scalp but now reports generalized headache. Symptoms are also associated with chills and fatigue. She has not traveled anywhere recently. Does not know of any known sick contacts. 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 No other aggravating or alleviating factors. No other associated symptoms. She has not used any medications for her symptoms.    Past Medical History  Diagnosis Date  . Asthma   . Herpes simplex of female genitalia     last outbreak 4 months ago  . Anxiety   . Depression     Past Surgical History  Procedure Laterality Date  . No past surgeries      History reviewed. No pertinent family history.  History  Substance Use Topics  . Smoking status: Current Every Day Smoker -- 0.25 packs/day    Types: Cigarettes  . Smokeless tobacco: Not on file  . Alcohol Use: No    OB History   Grav Para Term Preterm Abortions TAB SAB Ect Mult Living   2 2 2  0 0 0 0 0 0 2      Review of Systems  Constitutional: Positive for fever, chills, appetite change and fatigue.  HENT:  Positive for sore throat. Negative for congestion, rhinorrhea, trouble swallowing, voice change and sinus pressure.   Respiratory: Negative for cough and shortness of breath.   Cardiovascular: Negative for chest pain.  Gastrointestinal: Positive for nausea and abdominal pain. Negative for vomiting, diarrhea and constipation.  Genitourinary: Negative for dysuria, frequency, hematuria, flank pain, vaginal bleeding, vaginal discharge and pelvic pain.  Musculoskeletal: Positive for myalgias.  Neurological: Positive for headaches.  All other systems reviewed and are negative.    Allergies  Shellfish allergy  Home Medications   Current Outpatient Rx  Name  Route  Sig  Dispense  Refill  . albuterol (PROVENTIL HFA;VENTOLIN HFA) 108 (90 BASE) MCG/ACT inhaler   Inhalation   Inhale 2 puffs into the lungs every 6 (six) hours as needed for wheezing or shortness of breath.           BP 130/78  Pulse 107  Temp(Src) 99.5 F (37.5 C) (Oral)  SpO2 97%  Physical Exam  Nursing note and vitals reviewed. Constitutional: She is oriented to person, place, and time. She appears well-developed and well-nourished. No distress.  HENT:  Head: Normocephalic and atraumatic.  Right Ear: Tympanic membrane normal. There is tenderness. No mastoid tenderness.  Left Ear: Tympanic membrane normal. There is tenderness. No mastoid tenderness.  Pharynx is erythematous with enlarged tonsils bilateral  exudate. Uvula midline. No signs for PTA.  Eyes: Conjunctivae and EOM are normal. Pupils are equal, round, and reactive to light.  Neck: Normal range of motion. Neck supple.  No meningeal signs  Cardiovascular: Normal rate and regular rhythm.   No murmur heard. Pulmonary/Chest: Effort normal and breath sounds normal. No respiratory distress. She has no wheezes. She has no rales.  Abdominal: Soft. There is no tenderness. There is no rigidity, no rebound, no guarding, no CVA tenderness and no tenderness at McBurney's  point.  Musculoskeletal: Normal range of motion.  Lymphadenopathy:    She has cervical adenopathy.  Neurological: She is alert and oriented to person, place, and time. She has normal strength. No cranial nerve deficit or sensory deficit. Gait normal.  Skin: Skin is warm. No rash noted. She is diaphoretic.  Psychiatric: She has a normal mood and affect. Her behavior is normal.    ED Course  Procedures   Labs Reviewed  RAPID STREP SCREEN - Abnormal; Notable for the following:    Streptococcus, Group A Screen (Direct) POSITIVE (*)    All other components within normal limits     1. Strep throat       MDM  8:25 PM patient seen and evaluated. Patient lying in bed does not appear in any acute distress. She does appear in mild discomfort.  Patient with positive strep throat test. She prefers to have a one-time long-acting IM dose of penicillin. Will also recommend for her symptomatic treatment of symptoms of fever and body aches with Tylenol and ibuprofen at home. Patient encouraged to drink plenty of fluids. She otherwise appears well and nontoxic and may be discharged home. She is tolerating fluids.      Angus Seller, PA-C 09/20/12 2127

## 2012-09-21 NOTE — ED Provider Notes (Signed)
Medical screening examination/treatment/procedure(s) were performed by non-physician practitioner and as supervising physician I was immediately available for consultation/collaboration.  Kie Calvin T Lilyana Lippman, MD 09/21/12 1543 

## 2012-11-25 ENCOUNTER — Emergency Department (HOSPITAL_COMMUNITY)
Admission: EM | Admit: 2012-11-25 | Discharge: 2012-11-25 | Disposition: A | Discharge status: Home / Self Care | Payer: Self-pay | Attending: Emergency Medicine | Admitting: Emergency Medicine

## 2012-11-25 ENCOUNTER — Encounter (HOSPITAL_COMMUNITY): Payer: Self-pay | Admitting: *Deleted

## 2012-11-25 ENCOUNTER — Emergency Department (HOSPITAL_COMMUNITY): Payer: Self-pay

## 2012-11-25 DIAGNOSIS — R109 Unspecified abdominal pain: Secondary | ICD-10-CM | POA: Insufficient documentation

## 2012-11-25 DIAGNOSIS — Z3202 Encounter for pregnancy test, result negative: Secondary | ICD-10-CM | POA: Insufficient documentation

## 2012-11-25 DIAGNOSIS — Z8659 Personal history of other mental and behavioral disorders: Secondary | ICD-10-CM | POA: Insufficient documentation

## 2012-11-25 DIAGNOSIS — Z87891 Personal history of nicotine dependence: Secondary | ICD-10-CM | POA: Insufficient documentation

## 2012-11-25 DIAGNOSIS — J45909 Unspecified asthma, uncomplicated: Secondary | ICD-10-CM | POA: Insufficient documentation

## 2012-11-25 DIAGNOSIS — Z8619 Personal history of other infectious and parasitic diseases: Secondary | ICD-10-CM | POA: Insufficient documentation

## 2012-11-25 DIAGNOSIS — Z79899 Other long term (current) drug therapy: Secondary | ICD-10-CM | POA: Insufficient documentation

## 2012-11-25 LAB — URINALYSIS, ROUTINE W REFLEX MICROSCOPIC
Ketones, ur: NEGATIVE mg/dL
Protein, ur: NEGATIVE mg/dL
Urobilinogen, UA: 0.2 mg/dL (ref 0.0–1.0)

## 2012-11-25 LAB — CBC WITH DIFFERENTIAL/PLATELET
HCT: 36.1 % (ref 36.0–46.0)
Lymphocytes Relative: 14 % (ref 12–46)
MCHC: 35.5 g/dL (ref 30.0–36.0)
MCV: 91.6 fL (ref 78.0–100.0)
Monocytes Relative: 5 % (ref 3–12)
Platelets: 242 10*3/uL (ref 150–400)
RDW: 13.1 % (ref 11.5–15.5)
WBC: 12.8 10*3/uL — ABNORMAL HIGH (ref 4.0–10.5)

## 2012-11-25 LAB — COMPREHENSIVE METABOLIC PANEL
Alkaline Phosphatase: 46 U/L (ref 39–117)
BUN: 7 mg/dL (ref 6–23)
Chloride: 105 mEq/L (ref 96–112)
GFR calc Af Amer: >90 mL/min (ref >90)
Glucose, Bld: 89 mg/dL (ref 70–99)
Potassium: 3.9 mEq/L (ref 3.5–5.1)
Total Bilirubin: 0.9 mg/dL (ref 0.3–1.2)

## 2012-11-25 LAB — LIPASE, BLOOD: Lipase: 30 U/L (ref 11–59)

## 2012-11-25 MED ORDER — HYDROMORPHONE HCL PF 1 MG/ML IJ SOLN
1.0000 mg | Freq: Once | INTRAMUSCULAR | Status: AC
  Administered 2012-11-25: 1 mg via INTRAVENOUS
  Filled 2012-11-25: qty 1

## 2012-11-25 MED ORDER — IOHEXOL 300 MG/ML  SOLN
80.0000 mL | Freq: Once | INTRAMUSCULAR | Status: AC | PRN
  Administered 2012-11-25: 80 mL via INTRAVENOUS

## 2012-11-25 MED ORDER — HYDROCODONE-ACETAMINOPHEN 5-325 MG PO TABS: 2.0000 | ORAL_TABLET | ORAL | Status: DC | PRN

## 2012-11-25 MED ORDER — ONDANSETRON HCL 4 MG/2ML IJ SOLN
4.0000 mg | Freq: Once | INTRAMUSCULAR | Status: AC
  Administered 2012-11-25: 4 mg via INTRAMUSCULAR
  Filled 2012-11-25: qty 2

## 2012-11-25 MED ORDER — HYDROCODONE-ACETAMINOPHEN 5-325 MG PO TABS
2.0000 | ORAL_TABLET | Freq: Once | ORAL | Status: AC
  Administered 2012-11-25: 2 via ORAL
  Filled 2012-11-25: qty 2

## 2012-11-25 MED ORDER — POLYETHYLENE GLYCOL 3350 17 G PO PACK: 17.0000 g | PACK | Freq: Every day | ORAL | Status: DC

## 2012-11-25 MED ORDER — IOHEXOL 300 MG/ML  SOLN
25.0000 mL | INTRAMUSCULAR | Status: AC
  Administered 2012-11-25 (×2): 25 mL via ORAL

## 2012-11-25 NOTE — ED Notes (Signed)
Pa in to discuss discharge instructions with patient

## 2012-11-25 NOTE — ED Notes (Signed)
Patient has arrived on pod c and ct has arrived to take her over.

## 2012-11-25 NOTE — ED Notes (Signed)
Pt states her pain has not changed. States still with ruq pain and pain to her right abdomen and across her lower abdomen. Abdomen is soft. States does have some lower back pain. Mucous membranes are pink and moist. No active vomiting at this time. States did have vomiting and diarrhea this morning

## 2012-11-25 NOTE — ED Provider Notes (Signed)
History     CSN: [id number]  Arrival date & time 11/25/12  0544   First MD Initiated Contact with Patient 11/25/12 [phone]      Chief Complaint  Patient presents with  . Abdominal Pain    (Consider location/radiation/quality/duration/timing/severity/associated sxs/prior treatment) Patient is a 22 y.o. female presenting with abdominal pain. The history is provided by the patient. No language interpreter was used.  Abdominal Pain This is a new problem. The current episode started today. The problem occurs constantly. The problem has been gradually worsening. Associated symptoms include abdominal pain. Nothing aggravates the symptoms. She has tried nothing for the symptoms. The treatment provided mild relief.  Pt complains of right upper and right lower abdominal pain.  Pt reports she no vomitting, no diarrhea,  No std complaints, no pregnancy risk.   Pt denies uti symptoms  Past Medical History  Diagnosis Date  . Asthma   . Herpes simplex of female genitalia     last outbreak 4 months ago  . Anxiety   . Depression     Past Surgical History  Procedure Laterality Date  . No past surgeries      History reviewed. No pertinent family history.  History  Substance Use Topics  . Smoking status: Former Smoker -- 0.25 packs/day    Types: Cigarettes  . Smokeless tobacco: Not on file  . Alcohol Use: No    OB History   Grav Para Term Preterm Abortions TAB SAB Ect Mult Living   2 2 2  0 0 0 0 0 0 2      Review of Systems  Gastrointestinal: Positive for abdominal pain.  All other systems reviewed and are negative.    Allergies  Shellfish allergy  Home Medications   Current Outpatient Rx  Name  Route  Sig  Dispense  Refill  . albuterol (PROVENTIL HFA;VENTOLIN HFA) 108 (90 BASE) MCG/ACT inhaler   Inhalation   Inhale 2 puffs into the lungs every 6 (six) hours as needed for wheezing or shortness of breath.         Marland Kitchen ibuprofen (ADVIL,MOTRIN) 200 MG tablet   Oral   Take  200 mg by mouth every 6 (six) hours as needed for pain.           BP 130/83  Pulse 91  Temp(Src) 97.4 F (36.3 C) (Oral)  Resp 18  SpO2 98%  Physical Exam  Nursing note and vitals reviewed. Constitutional: She is oriented to person, place, and time. She appears well-developed and well-nourished.  HENT:  Right Ear: External ear normal.  Left Ear: External ear normal.  Eyes: Conjunctivae are normal. Pupils are equal, round, and reactive to light.  Neck: Normal range of motion. Neck supple.  Cardiovascular: Normal rate.   Pulmonary/Chest: Effort normal.  Abdominal: Soft.  Musculoskeletal: Normal range of motion.  Neurological: She is alert and oriented to person, place, and time. She has normal reflexes.  Skin: Skin is warm.  Psychiatric: She has a normal mood and affect.    ED Course  Procedures (including critical care time)  Labs Reviewed  URINALYSIS, ROUTINE W REFLEX MICROSCOPIC - Abnormal; Notable for the following:    Hgb urine dipstick TRACE (*)    All other components within normal limits  CBC WITH DIFFERENTIAL - Abnormal; Notable for the following:    WBC 12.8 (*)    Neutrophils Relative % 78 (*)    Neutro Abs 10.0 (*)    All other components within normal limits  URINE  MICROSCOPIC-ADD ON - Abnormal; Notable for the following:    Squamous Epithelial / LPF FEW (*)    All other components within normal limits  COMPREHENSIVE METABOLIC PANEL  LIPASE, BLOOD  POCT PREGNANCY, URINE   No results found.   1. Abdominal pain       MDM   Results for orders placed during the hospital encounter of 11/25/12  URINALYSIS, ROUTINE W REFLEX MICROSCOPIC      Result Value Range   Color, Urine YELLOW  YELLOW   APPearance CLEAR  CLEAR   Specific Gravity, Urine 1.016  1.005 - 1.030   pH 6.0  5.0 - 8.0   Glucose, UA NEGATIVE  NEGATIVE mg/dL   Hgb urine dipstick TRACE (*) NEGATIVE   Bilirubin Urine NEGATIVE  NEGATIVE   Ketones, ur NEGATIVE  NEGATIVE mg/dL   Protein,  ur NEGATIVE  NEGATIVE mg/dL   Urobilinogen, UA 0.2  0.0 - 1.0 mg/dL   Nitrite NEGATIVE  NEGATIVE   Leukocytes, UA NEGATIVE  NEGATIVE  CBC WITH DIFFERENTIAL      Result Value Range   WBC 12.8 (*) 4.0 - 10.5 K/uL   RBC 3.94  3.87 - 5.11 MIL/uL   Hemoglobin 12.8  12.0 - 15.0 g/dL   HCT 40.9  81.1 - 91.4 %   MCV 91.6  78.0 - 100.0 fL   MCH 32.5  26.0 - 34.0 pg   MCHC 35.5  30.0 - 36.0 g/dL   RDW 78.2  95.6 - 21.3 %   Platelets 242  150 - 400 K/uL   Neutrophils Relative % 78 (*) 43 - 77 %   Neutro Abs 10.0 (*) 1.7 - 7.7 K/uL   Lymphocytes Relative 14  12 - 46 %   Lymphs Abs 1.8  0.7 - 4.0 K/uL   Monocytes Relative 5  3 - 12 %   Monocytes Absolute 0.6  0.1 - 1.0 K/uL   Eosinophils Relative 3  0 - 5 %   Eosinophils Absolute 0.3  0.0 - 0.7 K/uL   Basophils Relative 0  0 - 1 %   Basophils Absolute 0.0  0.0 - 0.1 K/uL  COMPREHENSIVE METABOLIC PANEL      Result Value Range   Sodium 138  135 - 145 mEq/L   Potassium 3.9  3.5 - 5.1 mEq/L   Chloride 105  96 - 112 mEq/L   CO2 22  19 - 32 mEq/L   Glucose, Bld 89  70 - 99 mg/dL   BUN 7  6 - 23 mg/dL   Creatinine, Ser 0.86  0.50 - 1.10 mg/dL   Calcium 8.5  8.4 - 57.8 mg/dL   Total Protein 7.0  6.0 - 8.3 g/dL   Albumin 3.5  3.5 - 5.2 g/dL   AST 13  0 - 37 U/L   ALT 16  0 - 35 U/L   Alkaline Phosphatase 46  39 - 117 U/L   Total Bilirubin 0.9  0.3 - 1.2 mg/dL   GFR calc non Af Amer >90  >90 mL/min   GFR calc Af Amer >90  >90 mL/min  LIPASE, BLOOD      Result Value Range   Lipase 30  11 - 59 U/L  URINE MICROSCOPIC-ADD ON      Result Value Range   Squamous Epithelial / LPF FEW (*) RARE   RBC / HPF 3-6  <3 RBC/hpf   Bacteria, UA RARE  RARE  POCT PREGNANCY, URINE      Result Value  Range   Preg Test, Ur NEGATIVE  NEGATIVE   Ct Abdomen Pelvis W Contrast  11/25/2012   *RADIOLOGY REPORT*  Clinical Data: Abdominal and pelvic pain.  Nausea and vomiting.  CT ABDOMEN AND PELVIS WITH CONTRAST  Technique:  Multidetector CT imaging of the abdomen  and pelvis was performed following the standard protocol during bolus administration of intravenous contrast.  Contrast: 80mL OMNIPAQUE IOHEXOL 300 MG/ML  SOLN  Comparison: No priors.  Findings:  Lung Bases: Unremarkable.  Abdomen/Pelvis:  Ill-defined low attenuation and in segments 4A adjacent to the falciform ligament, likely a benign perfusion anomaly.  The remainder the liver is otherwise unremarkable in appearance.  The appearance of the gallbladder, pancreas, spleen, bilateral adrenal glands and bilateral kidneys is unremarkable. Normal appendix.  No significant volume of ascites.  No pneumoperitoneum.  No pathologic distension of small bowel.  No definite pathologic lymphadenopathy identified within the abdomen or pelvis.  Uterus and ovaries are unremarkable in appearance. Urinary bladder is normal in appearance.  Musculoskeletal: There are no aggressive appearing lytic or blastic lesions noted in the visualized portions of the skeleton.  IMPRESSION: 1.  No acute abnormality in the abdomen or pelvis to account the patient's symptoms. 2.  Normal appendix.   Original Report Authenticated By: Trudie Reed, M.D.         Pt has elevated WBC's.   Ct scan is normal.   I advised pt of results.    Pt advised to see her Primary for 24 hour recheck.   Try miralx  Elson Areas, PA-C 11/25/12 1305  Elson Areas, PA-C 11/25/12 1306

## 2012-11-25 NOTE — ED Notes (Signed)
Pt has returned from ct 

## 2012-11-25 NOTE — ED Notes (Signed)
Patient states she had the Mirana removed about 1 month ago and has been using birth control pills since then.  Stated that she that it was her period coming on but nothing has happened.  Stated the pain is in the lower abd area, right upper quad that travels across to the left side.

## 2012-11-25 NOTE — ED Provider Notes (Signed)
Medical screening examination/treatment/procedure(s) were performed by non-physician practitioner and as supervising physician I was immediately available for consultation/collaboration.  Hurman Horn, MD 11/25/12 2240

## 2012-11-25 NOTE — ED Notes (Signed)
Patient presents with c/o pain to lower abd area and right and left sides.

## 2013-02-07 ENCOUNTER — Encounter (HOSPITAL_COMMUNITY): Payer: Self-pay | Admitting: Emergency Medicine

## 2013-02-07 ENCOUNTER — Emergency Department (HOSPITAL_COMMUNITY)
Admission: EM | Admit: 2013-02-07 | Discharge: 2013-02-08 | Disposition: A | Discharge status: Home / Self Care | Payer: Self-pay | Attending: Emergency Medicine | Admitting: Emergency Medicine

## 2013-02-07 DIAGNOSIS — N390 Urinary tract infection, site not specified: Secondary | ICD-10-CM | POA: Insufficient documentation

## 2013-02-07 DIAGNOSIS — R112 Nausea with vomiting, unspecified: Secondary | ICD-10-CM | POA: Insufficient documentation

## 2013-02-07 DIAGNOSIS — R55 Syncope and collapse: Secondary | ICD-10-CM | POA: Insufficient documentation

## 2013-02-07 DIAGNOSIS — Z3202 Encounter for pregnancy test, result negative: Secondary | ICD-10-CM | POA: Insufficient documentation

## 2013-02-07 DIAGNOSIS — Z8619 Personal history of other infectious and parasitic diseases: Secondary | ICD-10-CM | POA: Insufficient documentation

## 2013-02-07 DIAGNOSIS — J45909 Unspecified asthma, uncomplicated: Secondary | ICD-10-CM | POA: Insufficient documentation

## 2013-02-07 DIAGNOSIS — Z8659 Personal history of other mental and behavioral disorders: Secondary | ICD-10-CM | POA: Insufficient documentation

## 2013-02-07 DIAGNOSIS — Z87891 Personal history of nicotine dependence: Secondary | ICD-10-CM | POA: Insufficient documentation

## 2013-02-07 NOTE — ED Notes (Signed)
PT. REPORTS GENERALIZED BODY ACHES , MALAISE , POOR APPETITE , NAUSEA AND VOMITTING ONSET TODAY .

## 2013-02-08 LAB — CBC
MCV: 88.7 fL (ref 78.0–100.0)
Platelets: 241 10*3/uL (ref 150–400)
RBC: 3.99 MIL/uL (ref 3.87–5.11)
RDW: 13.1 % (ref 11.5–15.5)
WBC: 5.9 10*3/uL (ref 4.0–10.5)

## 2013-02-08 LAB — POCT I-STAT, CHEM 8
Calcium, Ion: 1.18 mmol/L (ref 1.12–1.23)
Chloride: 104 mEq/L (ref 96–112)
HCT: 36 % (ref 36.0–46.0)
Potassium: 3.5 mEq/L (ref 3.5–5.1)
Sodium: 140 mEq/L (ref 135–145)

## 2013-02-08 LAB — URINALYSIS, ROUTINE W REFLEX MICROSCOPIC
Ketones, ur: NEGATIVE mg/dL
Leukocytes, UA: NEGATIVE
Nitrite: POSITIVE — AB
pH: 6.5 (ref 5.0–8.0)

## 2013-02-08 LAB — URINE MICROSCOPIC-ADD ON

## 2013-02-08 LAB — CG4 I-STAT (LACTIC ACID): Lactic Acid, Venous: 1.02 mmol/L (ref 0.5–2.2)

## 2013-02-08 MED ORDER — ONDANSETRON HCL 4 MG PO TABS: 4.0000 mg | ORAL_TABLET | Freq: Four times a day (QID) | ORAL | Status: DC

## 2013-02-08 MED ORDER — ONDANSETRON HCL 4 MG/2ML IJ SOLN
4.0000 mg | Freq: Once | INTRAMUSCULAR | Status: AC
  Administered 2013-02-08: 4 mg via INTRAVENOUS
  Filled 2013-02-08: qty 2

## 2013-02-08 MED ORDER — SODIUM CHLORIDE 0.9 % IV BOLUS (SEPSIS)
1000.0000 mL | Freq: Once | INTRAVENOUS | Status: AC
  Administered 2013-02-08: 1000 mL via INTRAVENOUS

## 2013-02-08 MED ORDER — CEPHALEXIN 500 MG PO CAPS: 500.0000 mg | ORAL_CAPSULE | Freq: Four times a day (QID) | ORAL | Status: DC

## 2013-02-08 MED ORDER — KETOROLAC TROMETHAMINE 30 MG/ML IJ SOLN
30.0000 mg | INTRAMUSCULAR | Status: AC
  Administered 2013-02-08: 30 mg via INTRAVENOUS
  Filled 2013-02-08: qty 1

## 2013-02-08 MED ORDER — CEFTRIAXONE SODIUM 1 G IJ SOLR
1.0000 g | Freq: Once | INTRAMUSCULAR | Status: AC
  Administered 2013-02-08: 1 g via INTRAVENOUS
  Filled 2013-02-08: qty 10

## 2013-02-08 MED ORDER — ONDANSETRON HCL 4 MG/2ML IJ SOLN
4.0000 mg | INTRAMUSCULAR | Status: AC
  Administered 2013-02-08: 4 mg via INTRAVENOUS
  Filled 2013-02-08: qty 2

## 2013-02-08 NOTE — ED Provider Notes (Signed)
CSN: [id number]     Arrival date & time 02/07/13  2244 History     First MD Initiated Contact with Patient 02/08/13 0023     Chief Complaint  Patient presents with  . Generalized Body Aches   (Consider location/radiation/quality/duration/timing/severity/associated sxs/prior Treatment) HPI Hx per PT - not feeling well today. N/V x 4 episodes, no ABD pain or diarrhea. Has only urinated once today and she c/o body aches all over. No HA, neck, pain or sore throat. She also had a near syncopal event at home, her friend helped her sit down, no fall or LOC. No cough, Cp or SOB. No h/o same. No recent travel, no known sick contacts. Past Medical History  Diagnosis Date  . Asthma   . Herpes simplex of female genitalia     last outbreak 4 months ago  . Anxiety   . Depression    Past Surgical History  Procedure Laterality Date  . No past surgeries     No family history on file. History  Substance Use Topics  . Smoking status: Former Smoker -- 0.25 packs/day    Types: Cigarettes  . Smokeless tobacco: Not on file  . Alcohol Use: No   OB History   Grav Para Term Preterm Abortions TAB SAB Ect Mult Living   2 2 2  0 0 0 0 0 0 2     Review of Systems  Constitutional: Negative for fever and chills.  HENT: Negative for neck pain and neck stiffness.   Eyes: Negative for visual disturbance.  Respiratory: Negative for shortness of breath.   Cardiovascular: Negative for chest pain.  Gastrointestinal: Positive for vomiting. Negative for abdominal pain.  Genitourinary: Negative for dysuria and difficulty urinating.  Musculoskeletal: Negative for back pain.  Skin: Negative for rash.  Neurological: Negative for headaches.  All other systems reviewed and are negative.    Allergies  Shellfish allergy  Home Medications  No current outpatient prescriptions on file. BP 124/69  Pulse 56  Temp(Src) 98.6 F (37 C) (Oral)  Resp 16  SpO2 100% Physical Exam  Constitutional: She is oriented  to person, place, and time. She appears well-developed and well-nourished.  HENT:  Head: Normocephalic and atraumatic.  Eyes: Conjunctivae and EOM are normal. Pupils are equal, round, and reactive to light. No scleral icterus.  Neck: Normal range of motion. Neck supple. No thyromegaly present.  Cardiovascular: Normal rate, regular rhythm and intact distal pulses.   Pulmonary/Chest: Effort normal and breath sounds normal. No respiratory distress. She exhibits no tenderness.  Abdominal: Soft. Bowel sounds are normal. She exhibits no distension and no mass. There is no tenderness. There is no rebound and no guarding.  Musculoskeletal: Normal range of motion. She exhibits no edema and no tenderness.  Neurological: She is alert and oriented to person, place, and time. No cranial nerve deficit.  Skin: Skin is warm and dry.    ED Course   Procedures (including critical care time)  Results for orders placed during the hospital encounter of 02/07/13  CBC      Result Value Range   WBC 5.9  4.0 - 10.5 K/uL   RBC 3.99  3.87 - 5.11 MIL/uL   Hemoglobin 13.0  12.0 - 15.0 g/dL   HCT 11.9 (*) 14.7 - 82.9 %   MCV 88.7  78.0 - 100.0 fL   MCH 32.6  26.0 - 34.0 pg   MCHC 36.7 (*) 30.0 - 36.0 g/dL   RDW 56.2  13.0 - 86.5 %  Platelets 241  150 - 400 K/uL  URINALYSIS, ROUTINE W REFLEX MICROSCOPIC      Result Value Range   Color, Urine YELLOW  YELLOW   APPearance CLOUDY (*) CLEAR   Specific Gravity, Urine 1.021  1.005 - 1.030   pH 6.5  5.0 - 8.0   Glucose, UA NEGATIVE  NEGATIVE mg/dL   Hgb urine dipstick TRACE (*) NEGATIVE   Bilirubin Urine NEGATIVE  NEGATIVE   Ketones, ur NEGATIVE  NEGATIVE mg/dL   Protein, ur NEGATIVE  NEGATIVE mg/dL   Urobilinogen, UA 1.0  0.0 - 1.0 mg/dL   Nitrite POSITIVE (*) NEGATIVE   Leukocytes, UA NEGATIVE  NEGATIVE  URINE MICROSCOPIC-ADD ON      Result Value Range   Squamous Epithelial / LPF FEW (*) RARE   WBC, UA 3-6  <3 WBC/hpf   RBC / HPF 0-2  <3 RBC/hpf    Bacteria, UA MANY (*) RARE  POCT I-STAT, CHEM 8      Result Value Range   Sodium 140  135 - 145 mEq/L   Potassium 3.5  3.5 - 5.1 mEq/L   Chloride 104  96 - 112 mEq/L   BUN 10  6 - 23 mg/dL   Creatinine, Ser 4.09  0.50 - 1.10 mg/dL   Glucose, Bld 87  70 - 99 mg/dL   Calcium, Ion 8.11  9.14 - 1.23 mmol/L   TCO2 24  0 - 100 mmol/L   Hemoglobin 12.2  12.0 - 15.0 g/dL   HCT 78.2  95.6 - 21.3 %  CG4 I-STAT (LACTIC ACID)      Result Value Range   Lactic Acid, Venous 1.02  0.5 - 2.2 mmol/L  POCT PREGNANCY, URINE      Result Value Range   Preg Test, Ur NEGATIVE  NEGATIVE    Date: 02/08/2013  Rate: 56  Rhythm: sinus bradycardia  QRS Axis: normal  Intervals: normal  ST/T Wave abnormalities: nonspecific ST changes  Conduction Disutrbances:none  Narrative Interpretation:   Old EKG Reviewed: no sig changes   IVFs, IV zofran IV Rocephin for UTI. Urine culture pending  Recheck after medications is starting to feel better and wants to go home. VS WNL. Stable for discharge home. Plan RX ABX, outpatient referral and PT states understanding strict return precautions for any worsening condition.    MDM  Body Aches, N/V today with UTI by reviewed of UA  ECG for near syncope no arrythmia.  Labs obtained/ reviewed  Improved with IVfs and medications  VS and nurses notes reviewed    Sunnie Nielsen, MD 02/08/13 0630

## 2013-02-08 NOTE — ED Notes (Signed)
Pt alert, NAD, calm, interactive, reaps e/u, speaking in clear complete sentences, abx infusing, pt updated with d/c plan, "ready to go", denies questions, friend at [facility].

## 2013-02-08 NOTE — ED Notes (Signed)
Pt presents with generalized body aches since yesterday morning. Reports 3-4 episodes of emesis. Denies fever or diarrhea. Denies being around anyone else that has been sick. Denies dysuria. Reports abdominal pain from vomiting.

## 2013-02-08 NOTE — ED Notes (Signed)
Pt stated that she was dizzy and needed to hold on to something when standing.

## 2013-02-08 NOTE — ED Notes (Signed)
abx infused, no reaction, "feels better", steady gait, given Rx x2, denies sx or questions.

## 2013-02-09 LAB — URINE CULTURE: Colony Count: >=100000

## 2013-02-10 ENCOUNTER — Telehealth (HOSPITAL_COMMUNITY): Payer: Self-pay | Admitting: *Deleted

## 2013-02-10 NOTE — Progress Notes (Signed)
ED Antimicrobial Stewardship Positive Culture Follow Up   Katrina [name] is an 22 y.o. female who presented to [facility] on 02/07/2013 with a chief complaint of  Chief Complaint  Patient presents with  . Generalized Body Aches    Recent Results (from the past 720 hour(s))  URINE CULTURE     Status: None   Collection Time    02/08/13  3:10 AM      Result Value Range Status   Specimen Description URINE, RANDOM   Final   Special Requests NONE ADDED AT 0336   Final   Culture  Setup Time     Final   Value: 02/08/2013 03:41     Performed at [facility] Count     Final   Value: >=100,000 COLONIES/ML     Performed at [facility]   Culture     Final   Value: STAPHYLOCOCCUS SPECIES (COAGULASE NEGATIVE)     Note: RIFAMPIN AND GENTAMICIN SHOULD NOT BE USED AS SINGLE DRUGS FOR TREATMENT OF STAPH INFECTIONS.     Performed at [facility]   Report Status 02/09/2013 FINAL   Final   Organism ID, Bacteria STAPHYLOCOCCUS SPECIES (COAGULASE NEGATIVE)   Final    [x]  Treated with cephalexin, organism resistant to prescribed antimicrobial []  Patient discharged originally without antimicrobial agent and treatment is now indicated  New antibiotic prescription: Macrobid 100mg  BID x 5 days  ED Provider: Francee Piccolo PAC   Mickeal Skinner 02/10/2013, 10:03 AM Infectious Diseases Pharmacist Phone# [phone]

## 2013-02-10 NOTE — ED Notes (Signed)
Post ED Visit - Positive Culture Follow-up: Successful Patient Follow-Up  Culture assessed and recommendations reviewed by: []  Wes Dulaney, Pharm.D., BCPS [x]  Celedonio Miyamoto, Pharm.D., BCPS []  Georgina Pillion, Pharm.D., BCPS []  [place], [place].D., BCPS, AAHIVP []  Estella Husk, Pharm.D., BCPS, AAHIVP  Positive urine culture  []  Patient discharged without antimicrobial prescription and treatment is now indicated [x]  Organism is resistant to prescribed ED discharge antimicrobial []  Patient with positive blood cultures  Changes discussed with ED provider: Victorino Dike Pippenbrink  New antibiotic prescription Stop Keflex start Macrobid 100 mg BID x 5 days   Larena Sox 02/10/2013, 4:18 PM

## 2013-03-29 ENCOUNTER — Emergency Department (HOSPITAL_COMMUNITY)
Admission: EM | Admit: 2013-03-29 | Discharge: 2013-03-29 | Disposition: A | Discharge status: Home / Self Care | Payer: Self-pay | Attending: Emergency Medicine | Admitting: Emergency Medicine

## 2013-03-29 ENCOUNTER — Emergency Department (HOSPITAL_COMMUNITY): Payer: No Typology Code available for payment source

## 2013-03-29 ENCOUNTER — Encounter (HOSPITAL_COMMUNITY): Payer: Self-pay | Admitting: Adult Health

## 2013-03-29 DIAGNOSIS — S139XXA Sprain of joints and ligaments of unspecified parts of neck, initial encounter: Secondary | ICD-10-CM | POA: Insufficient documentation

## 2013-03-29 DIAGNOSIS — Y939 Activity, unspecified: Secondary | ICD-10-CM | POA: Insufficient documentation

## 2013-03-29 DIAGNOSIS — J45909 Unspecified asthma, uncomplicated: Secondary | ICD-10-CM | POA: Insufficient documentation

## 2013-03-29 DIAGNOSIS — Z8614 Personal history of Methicillin resistant Staphylococcus aureus infection: Secondary | ICD-10-CM | POA: Insufficient documentation

## 2013-03-29 DIAGNOSIS — IMO0002 Reserved for concepts with insufficient information to code with codable children: Secondary | ICD-10-CM | POA: Insufficient documentation

## 2013-03-29 DIAGNOSIS — Z8659 Personal history of other mental and behavioral disorders: Secondary | ICD-10-CM | POA: Insufficient documentation

## 2013-03-29 DIAGNOSIS — Z87891 Personal history of nicotine dependence: Secondary | ICD-10-CM | POA: Insufficient documentation

## 2013-03-29 DIAGNOSIS — S0993XA Unspecified injury of face, initial encounter: Secondary | ICD-10-CM | POA: Insufficient documentation

## 2013-03-29 DIAGNOSIS — Y92410 Unspecified street and highway as the place of occurrence of the external cause: Secondary | ICD-10-CM | POA: Insufficient documentation

## 2013-03-29 MED ORDER — IBUPROFEN 800 MG PO TABS: 800.0000 mg | ORAL_TABLET | Freq: Three times a day (TID) | ORAL | Status: DC

## 2013-03-29 MED ORDER — OXYCODONE-ACETAMINOPHEN 5-325 MG PO TABS
1.0000 | ORAL_TABLET | Freq: Once | ORAL | Status: AC
  Administered 2013-03-29: 1 via ORAL
  Filled 2013-03-29: qty 1

## 2013-03-29 MED ORDER — CYCLOBENZAPRINE HCL 10 MG PO TABS: 10.0000 mg | ORAL_TABLET | Freq: Two times a day (BID) | ORAL | Status: DC | PRN

## 2013-03-29 MED ORDER — OXYCODONE-ACETAMINOPHEN 5-325 MG PO TABS: 1.0000 | ORAL_TABLET | Freq: Four times a day (QID) | ORAL | Status: DC | PRN
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
## 2013-03-29 NOTE — ED Notes (Signed)
NP at bedside.

## 2013-03-29 NOTE — ED Notes (Signed)
Ccollar was placed in traige
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
## 2013-04-01 NOTE — ED Provider Notes (Signed)
Medical screening examination/treatment/procedure(s) were performed by non-physician practitioner and as supervising physician I was immediately available for consultation/collaboration.  Kongmeng Santoro T Ayiden Milliman, MD 04/01/13 0706 

## 2013-04-07 ENCOUNTER — Encounter (HOSPITAL_COMMUNITY): Payer: Self-pay | Admitting: Emergency Medicine

## 2013-04-07 ENCOUNTER — Emergency Department (HOSPITAL_COMMUNITY): Payer: No Typology Code available for payment source

## 2013-04-07 ENCOUNTER — Emergency Department (HOSPITAL_COMMUNITY)
Admission: EM | Admit: 2013-04-07 | Discharge: 2013-04-07 | Disposition: A | Discharge status: Home / Self Care | Payer: Self-pay | Attending: Emergency Medicine | Admitting: Emergency Medicine

## 2013-04-07 DIAGNOSIS — J4522 Mild intermittent asthma with status asthmaticus: Secondary | ICD-10-CM

## 2013-04-07 DIAGNOSIS — Z8659 Personal history of other mental and behavioral disorders: Secondary | ICD-10-CM | POA: Insufficient documentation

## 2013-04-07 DIAGNOSIS — J45902 Unspecified asthma with status asthmaticus: Secondary | ICD-10-CM | POA: Insufficient documentation

## 2013-04-07 DIAGNOSIS — F17200 Nicotine dependence, unspecified, uncomplicated: Secondary | ICD-10-CM | POA: Insufficient documentation

## 2013-04-07 DIAGNOSIS — Z720 Tobacco use: Secondary | ICD-10-CM

## 2013-04-07 DIAGNOSIS — Z791 Long term (current) use of non-steroidal anti-inflammatories (NSAID): Secondary | ICD-10-CM | POA: Insufficient documentation

## 2013-04-07 DIAGNOSIS — Z8619 Personal history of other infectious and parasitic diseases: Secondary | ICD-10-CM | POA: Insufficient documentation

## 2013-04-07 MED ORDER — PREDNISONE 20 MG PO TABS: ORAL_TABLET | ORAL | Status: DC

## 2013-04-07 MED ORDER — PREDNISONE 20 MG PO TABS
60.0000 mg | ORAL_TABLET | Freq: Once | ORAL | Status: AC
  Administered 2013-04-07: 60 mg via ORAL
  Filled 2013-04-07: qty 3

## 2013-04-07 MED ORDER — ALBUTEROL SULFATE HFA 108 (90 BASE) MCG/ACT IN AERS: 1.0000 | INHALATION_SPRAY | Freq: Four times a day (QID) | RESPIRATORY_TRACT | Status: DC | PRN

## 2013-04-07 MED ORDER — ALBUTEROL SULFATE (5 MG/ML) 0.5% IN NEBU
5.0000 mg | INHALATION_SOLUTION | Freq: Once | RESPIRATORY_TRACT | Status: AC
  Administered 2013-04-07: 5 mg via RESPIRATORY_TRACT
  Filled 2013-04-07: qty 1

## 2013-04-07 NOTE — ED Notes (Signed)
Pt. reports SOB with productive cough and chest congestion / tightness onset this evening , pt. stated history of asthma , ran out of MDI .

## 2013-04-07 NOTE — ED Notes (Signed)
Patient transported to X-ray 

## 2013-04-07 NOTE — ED Provider Notes (Signed)
CSN: [id number]     Arrival date & time 04/07/13  0125 History   First MD Initiated Contact with Patient 04/07/13 0208     Chief Complaint  Patient presents with  . Shortness of Breath  . Cough   (Consider location/radiation/quality/duration/timing/severity/associated sxs/prior Treatment) HPI This patient is a 22 year old woman with asthma he smokes cigarettes. She presents to the emergency department with complaints of shortness of breath. She received an albuterol nebulized treatment shortly prior to my evaluation of her. She said her symptoms started while she was in a bar with friends. She attributes the onset of her symptoms to exposure to cold air. She has been wheezing. She notes a mild bronchospastic cough. She has some mild chest tightness. No history of admission for asthma. No recent recreational drug use.   Past Medical History  Diagnosis Date  . Asthma   . Herpes simplex of female genitalia     last outbreak 4 months ago  . Anxiety   . Depression    Past Surgical History  Procedure Laterality Date  . No past surgeries     No family history on file. History  Substance Use Topics  . Smoking status: Former Smoker -- 0.25 packs/day    Types: Cigarettes  . Smokeless tobacco: Not on file  . Alcohol Use: No   OB History   Grav Para Term Preterm Abortions TAB SAB Ect Mult Living   2 2 2  0 0 0 0 0 0 2     Review of Systems 10 point review of systems performed and is negative with the exception of symptoms noted above  Allergies  Shellfish allergy  Home Medications   Current Outpatient Rx  Name  Route  Sig  Dispense  Refill  . ibuprofen (ADVIL,MOTRIN) 800 MG tablet   Oral   Take 1 tablet (800 mg total) by mouth 3 (three) times daily.   21 tablet   0   . cyclobenzaprine (FLEXERIL) 10 MG tablet   Oral   Take 1 tablet (10 mg total) by mouth 2 (two) times daily as needed for muscle spasms.   20 tablet   0   . oxyCODONE-acetaminophen (PERCOCET/ROXICET) 5-325  MG per tablet   Oral   Take 1 tablet by mouth every 6 (six) hours as needed for pain.   8 tablet   0    BP 115/83  Pulse 119  Temp(Src) 98.3 F (36.8 C) (Oral)  Resp 14  SpO2 100% Physical Exam Gen: well developed and well nourished appearing Head: NCAT Eyes: PERL, EOMI Nose: no epistaixis or rhinorrhea Mouth/throat: mucosa is moist and pink Neck: supple, no stridor Lungs: CTA B, no wheezing, rhonchi or rales, no wheezing, RR 20/min CV: rapid and regular, pulse approx 112/min, ext well perfused Abd: soft, notender, nondistended Back: no ttp, no cva ttp Skin: no rash, wnl Neuro: CN ii-xii grossly intact, no focal deficits Psyche; normal affect,  calm and cooperative.   ED Course  Procedures (including critical care time) Imaging Review Dg Chest 2 View  04/07/2013   *RADIOLOGY REPORT*  Clinical Data: Shortness of breath and cough.  CHEST - 2 VIEW  Comparison: Chest radiograph performed 07/07/2010  Findings: The lungs are well-aerated and clear.  There is no evidence of focal opacification, pleural effusion or pneumothorax.  The heart is normal in size; the mediastinal contour is within normal limits.  No acute osseous abnormalities are seen.  IMPRESSION: No acute cardiopulmonary process seen.   Original Report Authenticated By:  Tonia Ghent, M.D.    MDM  Patient with acute asthma excacerbation improved after tx with Albuterol. Lingering tachycardia following Albuterol tx. No infiltrate on CXR. Patient counseled re: importance of immediate smoking cessation - particularly in light of asthma. We will tx with prednisone burst. Patient is stable with plan for outpatient f/u.    Brandt Loosen, MD 04/07/13 [phone]

## 2013-05-10 ENCOUNTER — Emergency Department (HOSPITAL_COMMUNITY): Payer: Self-pay

## 2013-05-10 ENCOUNTER — Encounter (HOSPITAL_COMMUNITY): Payer: Self-pay | Admitting: Emergency Medicine

## 2013-05-10 ENCOUNTER — Emergency Department (HOSPITAL_COMMUNITY)
Admission: EM | Admit: 2013-05-10 | Discharge: 2013-05-10 | Disposition: A | Discharge status: Home / Self Care | Payer: Self-pay | Attending: Emergency Medicine | Admitting: Emergency Medicine

## 2013-05-10 DIAGNOSIS — Z8669 Personal history of other diseases of the nervous system and sense organs: Secondary | ICD-10-CM | POA: Insufficient documentation

## 2013-05-10 DIAGNOSIS — Z3202 Encounter for pregnancy test, result negative: Secondary | ICD-10-CM | POA: Insufficient documentation

## 2013-05-10 DIAGNOSIS — R51 Headache: Secondary | ICD-10-CM | POA: Insufficient documentation

## 2013-05-10 DIAGNOSIS — F3289 Other specified depressive episodes: Secondary | ICD-10-CM | POA: Insufficient documentation

## 2013-05-10 DIAGNOSIS — Z87891 Personal history of nicotine dependence: Secondary | ICD-10-CM | POA: Insufficient documentation

## 2013-05-10 DIAGNOSIS — M542 Cervicalgia: Secondary | ICD-10-CM | POA: Insufficient documentation

## 2013-05-10 DIAGNOSIS — F329 Major depressive disorder, single episode, unspecified: Secondary | ICD-10-CM | POA: Insufficient documentation

## 2013-05-10 DIAGNOSIS — R519 Headache, unspecified: Secondary | ICD-10-CM

## 2013-05-10 DIAGNOSIS — H53149 Visual discomfort, unspecified: Secondary | ICD-10-CM | POA: Insufficient documentation

## 2013-05-10 DIAGNOSIS — J45909 Unspecified asthma, uncomplicated: Secondary | ICD-10-CM | POA: Insufficient documentation

## 2013-05-10 DIAGNOSIS — F411 Generalized anxiety disorder: Secondary | ICD-10-CM | POA: Insufficient documentation

## 2013-05-10 DIAGNOSIS — Z7982 Long term (current) use of aspirin: Secondary | ICD-10-CM | POA: Insufficient documentation

## 2013-05-10 DIAGNOSIS — R110 Nausea: Secondary | ICD-10-CM | POA: Insufficient documentation

## 2013-05-10 DIAGNOSIS — Z8619 Personal history of other infectious and parasitic diseases: Secondary | ICD-10-CM | POA: Insufficient documentation

## 2013-05-10 DIAGNOSIS — Z79899 Other long term (current) drug therapy: Secondary | ICD-10-CM | POA: Insufficient documentation

## 2013-05-10 HISTORY — DX: Migraine, unspecified, not intractable, without status migrainosus: G43.909

## 2013-05-10 LAB — GRAM STAIN

## 2013-05-10 LAB — CSF CELL COUNT WITH DIFFERENTIAL
RBC Count, CSF: 2080 /mm3 — ABNORMAL HIGH
Tube #: 3
WBC, CSF: 0 /mm3 (ref 0–5)

## 2013-05-10 LAB — CBC WITH DIFFERENTIAL/PLATELET
Basophils Absolute: 0 10*3/uL (ref 0.0–0.1)
Basophils Relative: 0 % (ref 0–1)
Eosinophils Absolute: 0.4 10*3/uL (ref 0.0–0.7)
Eosinophils Relative: 7 % — ABNORMAL HIGH (ref 0–5)
HCT: 36.8 % (ref 36.0–46.0)
Hemoglobin: 13.3 g/dL (ref 12.0–15.0)
Lymphocytes Relative: 40 % (ref 12–46)
Lymphs Abs: 2.4 10*3/uL (ref 0.7–4.0)
MCH: 32.8 pg (ref 26.0–34.0)
MCHC: 36.1 g/dL — ABNORMAL HIGH (ref 30.0–36.0)
MCV: 90.6 fL (ref 78.0–100.0)
Monocytes Absolute: 0.6 10*3/uL (ref 0.1–1.0)
Monocytes Relative: 9 % (ref 3–12)
Neutro Abs: 2.7 10*3/uL (ref 1.7–7.7)
Neutrophils Relative %: 45 % (ref 43–77)
Platelets: 225 10*3/uL (ref 150–400)
RBC: 4.06 MIL/uL (ref 3.87–5.11)
RDW: 12.5 % (ref 11.5–15.5)
WBC: 6 10*3/uL (ref 4.0–10.5)

## 2013-05-10 LAB — BASIC METABOLIC PANEL
BUN: 13 mg/dL (ref 6–23)
CO2: 25 mEq/L (ref 19–32)
Calcium: 8.8 mg/dL (ref 8.4–10.5)
Chloride: 105 mEq/L (ref 96–112)
Creatinine, Ser: 0.68 mg/dL (ref 0.50–1.10)
GFR calc Af Amer: >90 mL/min (ref >90)
GFR calc non Af Amer: >90 mL/min (ref >90)
Glucose, Bld: 88 mg/dL (ref 70–99)
Potassium: 3.9 mEq/L (ref 3.5–5.1)
Sodium: 138 mEq/L (ref 135–145)

## 2013-05-10 LAB — GLUCOSE, CSF: Glucose, CSF: 54 mg/dL (ref 43–76)

## 2013-05-10 LAB — PROTEIN, CSF: Total  Protein, CSF: 37 mg/dL (ref 15–45)

## 2013-05-10 LAB — PREGNANCY, URINE: Preg Test, Ur: NEGATIVE

## 2013-05-10 MED ORDER — HYDROMORPHONE HCL PF 1 MG/ML IJ SOLN
1.0000 mg | Freq: Once | INTRAMUSCULAR | Status: AC
  Administered 2013-05-10: 1 mg via INTRAVENOUS
  Filled 2013-05-10: qty 1

## 2013-05-10 MED ORDER — DEXAMETHASONE SODIUM PHOSPHATE 10 MG/ML IJ SOLN
10.0000 mg | Freq: Once | INTRAMUSCULAR | Status: AC
  Administered 2013-05-10: 10 mg via INTRAVENOUS
  Filled 2013-05-10: qty 1

## 2013-05-10 MED ORDER — LIDOCAINE HCL (PF) 1 % IJ SOLN
5.0000 mL | Freq: Once | INTRAMUSCULAR | Status: AC
  Administered 2013-05-10: 5 mL via INTRADERMAL
  Filled 2013-05-10: qty 5

## 2013-05-10 MED ORDER — ACETAMINOPHEN 325 MG PO TABS: 650.0000 mg | ORAL_TABLET | ORAL | Status: DC | PRN

## 2013-05-10 MED ORDER — DIPHENHYDRAMINE HCL 50 MG/ML IJ SOLN
25.0000 mg | Freq: Once | INTRAMUSCULAR | Status: AC
  Administered 2013-05-10: 25 mg via INTRAVENOUS
  Filled 2013-05-10: qty 1

## 2013-05-10 MED ORDER — METOCLOPRAMIDE HCL 5 MG/ML IJ SOLN
10.0000 mg | Freq: Once | INTRAMUSCULAR | Status: AC
  Administered 2013-05-10: 10 mg via INTRAVENOUS
  Filled 2013-05-10: qty 2

## 2013-05-10 MED ORDER — IOHEXOL 350 MG/ML SOLN
50.0000 mL | Freq: Once | INTRAVENOUS | Status: AC | PRN
  Administered 2013-05-10: 50 mL via INTRAVENOUS

## 2013-05-10 MED ORDER — SODIUM CHLORIDE 0.9 % IV BOLUS (SEPSIS)
1000.0000 mL | Freq: Once | INTRAVENOUS | Status: AC
  Administered 2013-05-10: 1000 mL via INTRAVENOUS

## 2013-05-10 MED ORDER — KETOROLAC TROMETHAMINE 30 MG/ML IJ SOLN
30.0000 mg | Freq: Once | INTRAMUSCULAR | Status: AC
  Administered 2013-05-10: 30 mg via INTRAVENOUS
  Filled 2013-05-10: qty 1

## 2013-05-10 NOTE — ED Notes (Signed)
Pt states Sunday nite started having a bad headache and was relieved by goodie powder.  Pt states had light headache yesterday.  Pt states this am she had a bad headache that starts in her neck and radiates up.  Pt has migraines but has never had a headache like this before.

## 2013-05-10 NOTE — ED Notes (Signed)
Pt in CT scan.

## 2013-05-10 NOTE — ED Provider Notes (Signed)
CSN: [id number]     Arrival date & time 05/10/13  1051 History   First MD Initiated Contact with Patient 05/10/13 1114     Chief Complaint  Patient presents with  . Headache   (Consider location/radiation/quality/duration/timing/severity/associated sxs/prior Treatment) HPI    22 year old female with headache. Gradual onset Sunday while at rest. She initially took some Goody powder the headache resolved. The headache came back later that night/early Monday morning and has been persistent since. She describes pain in the base of her neck and extends forward into her frontal region. Neck pain is worse with movement. No fevers or chills. Mild nausea. Photophobia. Otherwise no visual complaints. No acute numbness, tingling or loss of strength. She denies any trauma. No blood thinning medication.     Past Medical History  Diagnosis Date  . Asthma   . Herpes simplex of female genitalia     last outbreak 4 months ago  . Anxiety   . Depression   . Migraines    Past Surgical History  Procedure Laterality Date  . No past surgeries     No family history on file. History  Substance Use Topics  . Smoking status: Former Smoker -- 0.25 packs/day    Types: Cigarettes  . Smokeless tobacco: Not on file  . Alcohol Use: No   OB History   Grav Para Term Preterm Abortions TAB SAB Ect Mult Living   2 2 2  0 0 0 0 0 0 2     Review of Systems  All systems reviewed and negative, other than as noted in HPI.   Allergies  Shellfish allergy  Home Medications   Current Outpatient Rx  Name  Route  Sig  Dispense  Refill  . acetaminophen (TYLENOL) 500 MG tablet   Oral   Take 1,000 mg by mouth every 6 (six) hours as needed for headache.          . albuterol (PROVENTIL HFA;VENTOLIN HFA) 108 (90 BASE) MCG/ACT inhaler   Inhalation   Inhale 1-2 puffs into the lungs every 6 (six) hours as needed for wheezing.   1 Inhaler   0   . aspirin 325 MG tablet   Oral   Take 650 mg by mouth once.          . Aspirin-Acetaminophen-Caffeine (GOODY HEADACHE PO)   Oral   Take 1 packet by mouth every 4 (four) hours as needed (for pain).          BP 123/72  Pulse 81  Temp(Src) 97.9 F (36.6 C) (Oral)  Resp 18  Ht 5\' 1"  (1.549 m)  Wt 151 lb 11.2 oz (68.811 kg)  BMI 28.68 kg/m2  SpO2 100% Physical Exam  Nursing note and vitals reviewed. Constitutional: She is oriented to person, place, and time. She appears well-developed and well-nourished. No distress.  HENT:  Head: Normocephalic and atraumatic.  Eyes: Conjunctivae and EOM are normal. Pupils are equal, round, and reactive to light. Right eye exhibits no discharge. Left eye exhibits no discharge.  Neck: Normal range of motion. Neck supple.  Pt does report increased neck pain with movement, but no nuchal rigidity. Neg kernig's/brudzinski.   Cardiovascular: Normal rate, regular rhythm and normal heart sounds.  Exam reveals no gallop and no friction rub.   No murmur heard. Pulmonary/Chest: Effort normal and breath sounds normal. No respiratory distress.  Abdominal: Soft. She exhibits no distension. There is no tenderness.  Musculoskeletal: She exhibits no edema and no tenderness.  Neurological: She is alert  and oriented to person, place, and time. No cranial nerve deficit. She exhibits normal muscle tone. Coordination normal.  Good finger to nose b/l  Skin: Skin is warm and dry. She is not diaphoretic.  Psychiatric: She has a normal mood and affect. Her behavior is normal. Thought content normal.    ED Course  LUMBAR PUNCTURE Date/Time: 05/10/2013 6:50 PM Performed by: Raeford Razor Authorized by: Raeford Razor Consent: Verbal consent obtained. Consent given by: patient Required items: required blood products, implants, devices, and special equipment available Patient identity confirmed: verbally with patient, provided demographic data and arm band Indications: evaluation for infection Anesthesia: local infiltration Local  anesthetic: lidocaine 1% without epinephrine Anesthetic total: 5 ml Patient sedated: no Preparation: Patient was prepped and draped in the usual sterile fashion. Lumbar space: L3-L4 interspace Patient's position: sitting Needle gauge: 20 Needle type: spinal needle - Quincke tip Needle length: 3.5 in Number of attempts: 2 Post-procedure: site cleaned and adhesive bandage applied Patient tolerance: Patient tolerated the procedure well with no immediate complications. Comments: LP attempted by PA student Almira Coaster under my direct supervision and then by myself. Unable to obtain CSF and stopped because of pt discomfort.    (including critical care time) Labs Review Labs Reviewed  CBC WITH DIFFERENTIAL - Abnormal; Notable for the following:    MCHC 36.1 (*)    Eosinophils Relative 7 (*)    All other components within normal limits  CSF CELL COUNT WITH DIFFERENTIAL - Abnormal; Notable for the following:    Color, CSF PINK (*)    Appearance, CSF HAZY (*)    RBC Count, CSF 2080 (*)    All other components within normal limits  GRAM STAIN  CSF CULTURE  BASIC METABOLIC PANEL  PREGNANCY, URINE  GLUCOSE, CSF  PROTEIN, CSF  CSF CELL COUNT WITH DIFFERENTIAL   Imaging Review Ct Head Wo Contrast  05/10/2013   CLINICAL DATA:  Headache  EXAM: CT HEAD WITHOUT CONTRAST  TECHNIQUE: Contiguous axial images were obtained from the base of the skull through the vertex without intravenous contrast.  COMPARISON:  09/23/2011  FINDINGS: Ventricle size is normal. Negative for acute or chronic infarction. Negative for hemorrhage or fluid collection. Negative for mass or edema. No shift of the midline structures.  Calvarium is intact.  IMPRESSION: Normal   Electronically Signed   By: Marlan Palau M.D.   On: 05/10/2013 17:31   Dg Lumbar Puncture Fluoro Guide  05/10/2013   CLINICAL DATA:  Headache and neck pain.  EXAM: DIAGNOSTIC LUMBAR PUNCTURE UNDER FLUOROSCOPIC GUIDANCE  FLUOROSCOPY TIME:  0 min and 35  seconds.  PROCEDURE: Informed consent was obtained from the patient prior to the procedure, including potential complications of headache, allergy, and pain. With the patient prone, the lower back was prepped with Betadine. 1% Lidocaine was used for local anesthesia. Lumbar puncture was performed at the L3-4 level using a gauge needle with return of blood-tinged/ pinkish CSF with an opening pressure of 23 cm water. 9ml of CSF were obtained for laboratory studies. The patient tolerated the procedure well and there were no apparent complications.  IMPRESSION: Fluoroscopic guided lumbar puncture with blood-tinged/pinkish CSF which did not clear.   Electronically Signed   By: Loralie Champagne M.D.   On: 05/10/2013 16:20    EKG Interpretation   None       MDM   1. Headache     Pt with no improvement of HA. CSF pink and did not clear. Many red cells. May potentially  be "traumatic" tap. Opening pressure mildly increased as well though. Initially pursued to r/o meningitis and suspicion for [facility] low. I think it needs to be entertained at this point though. Will CT head. NS consultation.   Head CT normal, but done almost 48 hours after onset of symptoms.   Discussed with Dr Phoebe Perch, neurosurgery, including HPI, exam and diagnostic studies. In his opinion, "They spun it down and it was clear. That's a negative tap." He does not feel it's necessary to pursuit further diagnostic studies at this time.    Discussed with lab. 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 Would expect to see it with symptoms onset >12 hours ago. Lack of it is somewhat reassuring, but I do not feel the sensitivity of visual detection is sufficient to r/o such a serious diagnosis. Will CTA. Will not absolutely r/o, but negative cta will further decrease probability.    Raeford Razor, MD 05/12/13 1137

## 2013-05-10 NOTE — ED Notes (Signed)
Pt drowsy

## 2013-05-10 NOTE — ED Provider Notes (Signed)
Patient care assumed from Dr. Raeford Razor at shift change with CT angio head pending. Patient with concern for [facility] given headache onset and neck stiffness. No evidence of meningitis on LP today. Imaging pending at this time. Headache improved since arrival in ED 9+ hours ago.  CT angio head negative for any intracranial abnormality. Have discussed results with the patient who verbalizes understanding and is without any questions at this time. She is stable and appropriate for discharge home. Have recommended rest and plenty of sleep and a quiet dark place for continued management of her symptoms. Patient agreeable to discharge plan with no unaddressed concerns.   Results for orders placed during the hospital encounter of 05/10/13  GRAM STAIN      Result Value Range   Specimen Description CSF     Special Requests 2.0ML CSF FLUID     Gram Stain       Value: CYTOSPIN PREP     WBC PRESENT,BOTH PMN AND MONONUCLEAR     NO ORGANISMS SEEN   Report Status 05/10/2013 FINAL    CBC WITH DIFFERENTIAL      Result Value Range   WBC 6.0  4.0 - 10.5 K/uL   RBC 4.06  3.87 - 5.11 MIL/uL   Hemoglobin 13.3  12.0 - 15.0 g/dL   HCT 16.1  09.6 - 04.5 %   MCV 90.6  78.0 - 100.0 fL   MCH 32.8  26.0 - 34.0 pg   MCHC 36.1 (*) 30.0 - 36.0 g/dL   RDW 40.9  81.1 - 91.4 %   Platelets 225  150 - 400 K/uL   Neutrophils Relative % 45  43 - 77 %   Neutro Abs 2.7  1.7 - 7.7 K/uL   Lymphocytes Relative 40  12 - 46 %   Lymphs Abs 2.4  0.7 - 4.0 K/uL   Monocytes Relative 9  3 - 12 %   Monocytes Absolute 0.6  0.1 - 1.0 K/uL   Eosinophils Relative 7 (*) 0 - 5 %   Eosinophils Absolute 0.4  0.0 - 0.7 K/uL   Basophils Relative 0  0 - 1 %   Basophils Absolute 0.0  0.0 - 0.1 K/uL  BASIC METABOLIC PANEL      Result Value Range   Sodium 138  135 - 145 mEq/L   Potassium 3.9  3.5 - 5.1 mEq/L   Chloride 105  96 - 112 mEq/L   CO2 25  19 - 32 mEq/L   Glucose, Bld 88  70 - 99 mg/dL   BUN 13  6 - 23 mg/dL   Creatinine, Ser  7.82  0.50 - 1.10 mg/dL   Calcium 8.8  8.4 - 95.6 mg/dL   GFR calc non Af Amer >90  >90 mL/min   GFR calc Af Amer >90  >90 mL/min  PREGNANCY, URINE      Result Value Range   Preg Test, Ur NEGATIVE  NEGATIVE  CSF CELL COUNT WITH DIFFERENTIAL      Result Value Range   Tube # 3     Color, CSF PINK (*) COLORLESS   Appearance, CSF HAZY (*) CLEAR   Supernatant COLORLESS     RBC Count, CSF 2080 (*) 0 /cu mm   WBC, CSF 0  0 - 5 /cu mm   Segmented Neutrophils-CSF TOO FEW TO COUNT, SMEAR AVAILABLE FOR REVIEW  0 - 6 %   Lymphs, CSF FEW  40 - 80 %   Monocyte-Macrophage-Spinal Fluid OCCASIONAL  15 - 45 %  GLUCOSE, CSF      Result Value Range   Glucose, CSF 54  43 - 76 mg/dL  PROTEIN, CSF      Result Value Range   Total  Protein, CSF 37  15 - 45 mg/dL   Ct Angio Head W/cm &/or Wo Cm  05/10/2013   CLINICAL DATA:  Severe headache.  EXAM: CT ANGIOGRAPHY HEAD  TECHNIQUE: Multidetector CT imaging of the head was performed using the standard protocol during bolus administration of intravenous contrast. Multiplanar CT image reconstructions including MIPs were obtained to evaluate the vascular anatomy.  CONTRAST:  50mL OMNIPAQUE IOHEXOL 350 MG/ML SOLN  COMPARISON:  CT head earlier today  FINDINGS: Internal carotid arteries widely patent. Basilar artery widely patent. Both vertebrals contribute to basilar formation with the right dominant. There is no intracranial stenosis or aneurysm.  Post infusion imaging through the entire head demonstrates no abnormal enhancement of the brain or meninges.  Major dural venous sinuses appear patent.  Calvarium intact.  Review of the MIP images confirms the above findings.  IMPRESSION: Negative CTA of the intracranial circulation. No evidence for acute intracranial abnormality or vascular dissection.   Electronically Signed   By: Davonna Belling M.D.   On: 05/10/2013 20:56   Ct Head Wo Contrast  05/10/2013   CLINICAL DATA:  Headache  EXAM: CT HEAD WITHOUT CONTRAST  TECHNIQUE:  Contiguous axial images were obtained from the base of the skull through the vertex without intravenous contrast.  COMPARISON:  09/23/2011  FINDINGS: Ventricle size is normal. Negative for acute or chronic infarction. Negative for hemorrhage or fluid collection. Negative for mass or edema. No shift of the midline structures.  Calvarium is intact.  IMPRESSION: Normal   Electronically Signed   By: Marlan Palau M.D.   On: 05/10/2013 17:31   Dg Lumbar Puncture Fluoro Guide  05/10/2013   CLINICAL DATA:  Headache and neck pain.  EXAM: DIAGNOSTIC LUMBAR PUNCTURE UNDER FLUOROSCOPIC GUIDANCE  FLUOROSCOPY TIME:  0 min and 35 seconds.  PROCEDURE: Informed consent was obtained from the patient prior to the procedure, including potential complications of headache, allergy, and pain. With the patient prone, the lower back was prepped with Betadine. 1% Lidocaine was used for local anesthesia. Lumbar puncture was performed at the L3-4 level using a gauge needle with return of blood-tinged/ pinkish CSF with an opening pressure of 23 cm water. 9ml of CSF were obtained for laboratory studies. The patient tolerated the procedure well and there were no apparent complications.  IMPRESSION: Fluoroscopic guided lumbar puncture with blood-tinged/pinkish CSF which did not clear.   Electronically Signed   By: Loralie Champagne M.D.   On: 05/10/2013 16:20      Antony Madura, PA-C 05/10/13 2108

## 2013-05-11 ENCOUNTER — Encounter (HOSPITAL_COMMUNITY): Payer: Self-pay | Admitting: Emergency Medicine

## 2013-05-11 ENCOUNTER — Emergency Department (HOSPITAL_COMMUNITY)
Admission: EM | Admit: 2013-05-11 | Discharge: 2013-05-11 | Disposition: A | Discharge status: Home / Self Care | Payer: No Typology Code available for payment source | Attending: Emergency Medicine | Admitting: Emergency Medicine

## 2013-05-11 DIAGNOSIS — J45909 Unspecified asthma, uncomplicated: Secondary | ICD-10-CM | POA: Insufficient documentation

## 2013-05-11 DIAGNOSIS — Z8619 Personal history of other infectious and parasitic diseases: Secondary | ICD-10-CM | POA: Insufficient documentation

## 2013-05-11 DIAGNOSIS — Z7982 Long term (current) use of aspirin: Secondary | ICD-10-CM | POA: Insufficient documentation

## 2013-05-11 DIAGNOSIS — Z87891 Personal history of nicotine dependence: Secondary | ICD-10-CM | POA: Insufficient documentation

## 2013-05-11 DIAGNOSIS — Z79899 Other long term (current) drug therapy: Secondary | ICD-10-CM | POA: Insufficient documentation

## 2013-05-11 DIAGNOSIS — G971 Other reaction to spinal and lumbar puncture: Secondary | ICD-10-CM | POA: Insufficient documentation

## 2013-05-11 DIAGNOSIS — Z8679 Personal history of other diseases of the circulatory system: Secondary | ICD-10-CM | POA: Insufficient documentation

## 2013-05-11 DIAGNOSIS — Z8659 Personal history of other mental and behavioral disorders: Secondary | ICD-10-CM | POA: Insufficient documentation

## 2013-05-11 MED ORDER — SODIUM CHLORIDE 0.9 % IV SOLN
1000.0000 mL | Freq: Once | INTRAVENOUS | Status: AC
  Administered 2013-05-11: 1000 mL via INTRAVENOUS

## 2013-05-11 MED ORDER — METOCLOPRAMIDE HCL 5 MG/ML IJ SOLN
10.0000 mg | Freq: Once | INTRAMUSCULAR | Status: AC
  Administered 2013-05-11: 10 mg via INTRAVENOUS
  Filled 2013-05-11: qty 2

## 2013-05-11 MED ORDER — DIPHENHYDRAMINE HCL 50 MG/ML IJ SOLN
25.0000 mg | Freq: Once | INTRAMUSCULAR | Status: AC
  Administered 2013-05-11: 25 mg via INTRAVENOUS
  Filled 2013-05-11: qty 1

## 2013-05-11 NOTE — ED Notes (Signed)
MD at bedside. 

## 2013-05-11 NOTE — ED Provider Notes (Signed)
Medical screening examination/treatment/procedure(s) were performed by non-physician practitioner and as supervising physician I was immediately available for consultation/collaboration.  EKG Interpretation   None         Charles B. Bernette Mayers, MD 05/11/13 4540

## 2013-05-11 NOTE — ED Notes (Signed)
Checked pt CBG (83)

## 2013-05-11 NOTE — ED Provider Notes (Signed)
CSN: [id number]     Arrival date & time 05/11/13  1318 History   First MD Initiated Contact with Patient 05/11/13 1538     Chief Complaint  Patient presents with  . Headache   (Consider location/radiation/quality/duration/timing/severity/associated sxs/prior Treatment) HPI Onset was this morning upon waking.  The pain is moderately severe, described as tingling in back of head, only present upon sitting up or standing. Modifying factors: pain worse with sitting up, standing.  Associated symptoms: no fever, no emesis, no unilateral weakenss.  Recent medical care: here yesterday for different type of headache. Received LP, CT scans, d/c'd home.   Past Medical History  Diagnosis Date  . Asthma   . Herpes simplex of female genitalia     last outbreak 4 months ago  . Anxiety   . Depression   . Migraines    Past Surgical History  Procedure Laterality Date  . No past surgeries     No family history on file. History  Substance Use Topics  . Smoking status: Former Smoker -- 0.25 packs/day    Types: Cigarettes  . Smokeless tobacco: Not on file  . Alcohol Use: No   OB History   Grav Para Term Preterm Abortions TAB SAB Ect Mult Living   2 2 2  0 0 0 0 0 0 2     Review of Systems Constitutional: Negative for fever.  Eyes: Negative for vision loss.  ENT: Negative for difficulty swallowing.  Cardiovascular: Negative for chest pain. Respiratory: Negative for respiratory distress.  Gastrointestinal:  Negative for vomiting.  Genitourinary: Negative for inability to void.  Musculoskeletal: Negative for gait problem.  Integumentary: Negative for rash.  Neurological: Negative for new focal weakness.     Allergies  Shellfish allergy  Home Medications   Current Outpatient Rx  Name  Route  Sig  Dispense  Refill  . acetaminophen (TYLENOL) 500 MG tablet   Oral   Take 1,000 mg by mouth every 6 (six) hours as needed for headache.          . albuterol (PROVENTIL HFA;VENTOLIN HFA)  108 (90 BASE) MCG/ACT inhaler   Inhalation   Inhale 1-2 puffs into the lungs every 6 (six) hours as needed for wheezing.   1 Inhaler   0   . aspirin 325 MG tablet   Oral   Take 650 mg by mouth once.         . Aspirin-Acetaminophen-Caffeine (GOODY HEADACHE PO)   Oral   Take 1 packet by mouth every 4 (four) hours as needed (for pain).          BP 106/57  Pulse 77  Temp(Src) 98.8 F (37.1 C)  Resp 17  SpO2 100% Physical Exam Nursing note and vitals reviewed.  Constitutional: Pt is alert and appears stated age. Eyes: No injection, no scleral icterus. HENT: Atraumatic, airway open without erythema or exudate.  Respiratory: No respiratory distress. Equal breathing bilaterally. Cardiovascular: Normal rate. Extremities warm and well perfused.  Abdomen: Soft, non-tender. MSK: Extremities are atraumatic without deformity. Skin: No rash, no wounds.   Neuro: No motor nor sensory deficit. GCS 15. Normal coordination, normal CN.     ED Course  Procedures (including critical care time) Labs Review Labs Reviewed - No data to display Imaging Review   EKG Interpretation   None       MDM   1. Post lumbar puncture headache     22 y.o. female here with headache after receiving LP yesterday. Only present when  sitting or standing, resolved upon lying down. Chart reviewed. LP yesterday without signs of infection, RBCs present. CT scans without evidence of aneurysm. Pt looks well here, afebrile, normal vitals. Not c/w sinus thrombosis. Treated with IVF, IV reglan, IV benadryl. Plan for d/c home with pcp f/u. Counseling provided regarding diagnosis, treatment plan, follow up recommendations, and return precautions. Questions answered.       I independently viewed, interpreted, and used in my medical decision making all ordered lab and imaging tests. Medical Decision Making discussed with ED attending Joya Gaskins, MD     Charm Barges, MD 05/11/13 [id number]

## 2013-05-11 NOTE — ED Notes (Signed)
States was seen yesterday for h/a and had spinal tap still having h/a and feels tingling in body . Pt was ambulatory to triage no unusual movements of hands pt steady on feet

## 2013-05-11 NOTE — ED Notes (Signed)
MD at bedside.  Pt cleared and released after MD reviewed EKG, no abnormalities.  Pt discharged home with family.

## 2013-05-11 NOTE — ED Notes (Addendum)
Pt states if she sits up from a lying position the headache is worse.  Pt c/o the headache is worse when she lies on her Left side.

## 2013-05-11 NOTE — ED Notes (Signed)
MD Wickline at bedside. 

## 2013-05-12 ENCOUNTER — Emergency Department (HOSPITAL_COMMUNITY)
Admission: EM | Admit: 2013-05-12 | Discharge: 2013-05-12 | Disposition: A | Discharge status: Home / Self Care | Payer: Self-pay | Attending: Emergency Medicine | Admitting: Emergency Medicine

## 2013-05-12 ENCOUNTER — Encounter (HOSPITAL_COMMUNITY): Payer: Self-pay | Admitting: Emergency Medicine

## 2013-05-12 DIAGNOSIS — Z7982 Long term (current) use of aspirin: Secondary | ICD-10-CM | POA: Insufficient documentation

## 2013-05-12 DIAGNOSIS — R112 Nausea with vomiting, unspecified: Secondary | ICD-10-CM | POA: Insufficient documentation

## 2013-05-12 DIAGNOSIS — M542 Cervicalgia: Secondary | ICD-10-CM | POA: Insufficient documentation

## 2013-05-12 DIAGNOSIS — H53149 Visual discomfort, unspecified: Secondary | ICD-10-CM | POA: Insufficient documentation

## 2013-05-12 DIAGNOSIS — Z8679 Personal history of other diseases of the circulatory system: Secondary | ICD-10-CM | POA: Insufficient documentation

## 2013-05-12 DIAGNOSIS — R1013 Epigastric pain: Secondary | ICD-10-CM | POA: Insufficient documentation

## 2013-05-12 DIAGNOSIS — Z79899 Other long term (current) drug therapy: Secondary | ICD-10-CM | POA: Insufficient documentation

## 2013-05-12 DIAGNOSIS — Z8619 Personal history of other infectious and parasitic diseases: Secondary | ICD-10-CM | POA: Insufficient documentation

## 2013-05-12 DIAGNOSIS — Z8659 Personal history of other mental and behavioral disorders: Secondary | ICD-10-CM | POA: Insufficient documentation

## 2013-05-12 DIAGNOSIS — R51 Headache: Secondary | ICD-10-CM | POA: Insufficient documentation

## 2013-05-12 DIAGNOSIS — J45909 Unspecified asthma, uncomplicated: Secondary | ICD-10-CM | POA: Insufficient documentation

## 2013-05-12 DIAGNOSIS — Z87891 Personal history of nicotine dependence: Secondary | ICD-10-CM | POA: Insufficient documentation

## 2013-05-12 LAB — CBC WITH DIFFERENTIAL/PLATELET
Basophils Absolute: 0 10*3/uL (ref 0.0–0.1)
HCT: 38.1 % (ref 36.0–46.0)
Hemoglobin: 13.6 g/dL (ref 12.0–15.0)
Lymphs Abs: 2 10*3/uL (ref 0.7–4.0)
MCHC: 35.7 g/dL (ref 30.0–36.0)
MCV: 91.4 fL (ref 78.0–100.0)
Monocytes Absolute: 0.5 10*3/uL (ref 0.1–1.0)
Monocytes Relative: 6 % (ref 3–12)
Neutro Abs: 4.7 10*3/uL (ref 1.7–7.7)
Neutrophils Relative %: 64 % (ref 43–77)
RBC: 4.17 MIL/uL (ref 3.87–5.11)
RDW: 12.6 % (ref 11.5–15.5)
WBC: 7.3 10*3/uL (ref 4.0–10.5)

## 2013-05-12 LAB — COMPREHENSIVE METABOLIC PANEL
ALT: 41 U/L — ABNORMAL HIGH (ref 0–35)
Alkaline Phosphatase: 66 U/L (ref 39–117)
BUN: 10 mg/dL (ref 6–23)
CO2: 26 mEq/L (ref 19–32)
Chloride: 105 mEq/L (ref 96–112)
GFR calc Af Amer: >90 mL/min (ref >90)
GFR calc non Af Amer: >90 mL/min (ref >90)
Glucose, Bld: 105 mg/dL — ABNORMAL HIGH (ref 70–99)
Potassium: 3.4 mEq/L — ABNORMAL LOW (ref 3.5–5.1)
Total Bilirubin: 0.6 mg/dL (ref 0.3–1.2)

## 2013-05-12 LAB — LIPASE, BLOOD: Lipase: 21 U/L (ref 11–59)

## 2013-05-12 MED ORDER — LORAZEPAM 2 MG/ML IJ SOLN
0.5000 mg | Freq: Once | INTRAMUSCULAR | Status: AC
  Administered 2013-05-12: 0.5 mg via INTRAVENOUS
  Filled 2013-05-12: qty 1

## 2013-05-12 MED ORDER — SODIUM CHLORIDE 0.9 % IV BOLUS (SEPSIS)
1000.0000 mL | Freq: Once | INTRAVENOUS | Status: AC
  Administered 2013-05-12: 1000 mL via INTRAVENOUS

## 2013-05-12 MED ORDER — METOCLOPRAMIDE HCL 5 MG/ML IJ SOLN
10.0000 mg | Freq: Once | INTRAMUSCULAR | Status: AC
  Administered 2013-05-12: 10 mg via INTRAVENOUS
  Filled 2013-05-12: qty 2

## 2013-05-12 MED ORDER — ONDANSETRON HCL 4 MG/2ML IJ SOLN: 4.0000 mg | Freq: Once | INTRAMUSCULAR | Status: DC

## 2013-05-12 MED ORDER — ONDANSETRON HCL 4 MG/2ML IJ SOLN
4.0000 mg | Freq: Once | INTRAMUSCULAR | Status: AC
  Administered 2013-05-12: 4 mg via INTRAVENOUS
  Filled 2013-05-12: qty 2

## 2013-05-12 MED ORDER — ONDANSETRON 4 MG PO TBDP: 4.0000 mg | ORAL_TABLET | Freq: Three times a day (TID) | ORAL | Status: DC | PRN

## 2013-05-12 MED ORDER — DIPHENHYDRAMINE HCL 50 MG/ML IJ SOLN
25.0000 mg | Freq: Once | INTRAMUSCULAR | Status: AC
  Administered 2013-05-12: 25 mg via INTRAVENOUS
  Filled 2013-05-12: qty 1

## 2013-05-12 NOTE — ED Provider Notes (Signed)
CSN: [id number]     Arrival date & time 05/12/13  1417 History   First MD Initiated Contact with Patient 05/12/13 1419     Chief Complaint  Patient presents with  . Emesis  . Headache   (Consider location/radiation/quality/duration/timing/severity/associated sxs/prior Treatment) HPI Comments: Patient presents with complaint of occipital headache, stiff neck, and vomiting for 3 days. Patient was seen in emergency department 2 days ago and had a lumbar puncture and CTA. There was blood in the CSF that did not clear, however per the previous notes it was decided that possibility of bleed was low after consultation with neurosurgery. No concern for meningitis at the time. Patient was seen in emergency department yesterday and treated for headache. Patient states that she was not feeling better upon discharge and continued to vomit upon arrival home. She has not been able to keep down any fluids or solids. She continues to have headache and neck pain. Headache characteristics are unchanged. Patient states that she felt warm last night but did not have a thermometer to take her temperature. She has epigastric abdominal pain associated with vomiting. No urinary symptoms. No other treatments prior to arrival. She states she does not have antiemetics. The onset of this condition was acute. The course is constant. Aggravating factors: none. Alleviating factors: none. She is not on any estrogens. She was on Depo-Provera but missed her last shot two months ago.    Patient is a 22 y.o. female presenting with vomiting and headaches. The history is provided by the patient and medical records.  Emesis Associated symptoms: abdominal pain and headaches   Headache Associated symptoms: abdominal pain, nausea, neck pain, neck stiffness, photophobia and vomiting   Associated symptoms: no congestion, no fever, no numbness and no sinus pressure     Past Medical History  Diagnosis Date  . Asthma   . Herpes simplex of  female genitalia     last outbreak 4 months ago  . Anxiety   . Depression   . Migraines    Past Surgical History  Procedure Laterality Date  . No past surgeries     No family history on file. History  Substance Use Topics  . Smoking status: Former Smoker -- 0.25 packs/day    Types: Cigarettes    Quit date: 05/02/2013  . Smokeless tobacco: Not on file  . Alcohol Use: No   OB History   Grav Para Term Preterm Abortions TAB SAB Ect Mult Living   2 2 2  0 0 0 0 0 0 2     Review of Systems  Constitutional: Negative for fever.  HENT: Negative for congestion, dental problem, rhinorrhea and sinus pressure.   Eyes: Positive for photophobia. Negative for discharge, redness and visual disturbance.  Respiratory: Negative for shortness of breath.   Cardiovascular: Negative for chest pain.  Gastrointestinal: Positive for nausea, vomiting and abdominal pain.  Musculoskeletal: Positive for neck pain and neck stiffness. Negative for gait problem.  Skin: Negative for rash.  Neurological: Positive for headaches. Negative for syncope, speech difficulty, weakness, light-headedness and numbness.  Psychiatric/Behavioral: Negative for confusion.    Allergies  Shellfish allergy  Home Medications   Current Outpatient Rx  Name  Route  Sig  Dispense  Refill  . acetaminophen (TYLENOL) 500 MG tablet   Oral   Take 1,000 mg by mouth every 6 (six) hours as needed for headache.          . albuterol (PROVENTIL HFA;VENTOLIN HFA) 108 (90 BASE) MCG/ACT inhaler  Inhalation   Inhale 1-2 puffs into the lungs every 6 (six) hours as needed for wheezing.   1 Inhaler   0   . aspirin 325 MG tablet   Oral   Take 650 mg by mouth once.         . Aspirin-Acetaminophen-Caffeine (GOODY HEADACHE PO)   Oral   Take 1 packet by mouth every 4 (four) hours as needed (for pain).          BP 122/64  Pulse 51  Temp(Src) 98.2 F (36.8 C) (Oral)  Resp 18  Ht 5\' 1"  (1.549 m)  Wt 150 lb (68.04 kg)  BMI  28.36 kg/m2  SpO2 99% Physical Exam  Nursing note and vitals reviewed. Constitutional: She is oriented to person, place, and time. She appears well-developed and well-nourished.  HENT:  Head: Normocephalic and atraumatic.  Right Ear: Tympanic membrane, external ear and ear canal normal.  Left Ear: Tympanic membrane, external ear and ear canal normal.  Nose: Nose normal.  Mouth/Throat: Uvula is midline, oropharynx is clear and moist and mucous membranes are normal.  Eyes: Conjunctivae, EOM and lids are normal. Pupils are equal, round, and reactive to light. Right eye exhibits no discharge. Left eye exhibits no discharge. Right eye exhibits no nystagmus. Left eye exhibits no nystagmus.  Neck: Normal range of motion. Neck supple.  Neg Brudinski  Cardiovascular: Normal rate, regular rhythm and normal heart sounds.   No murmur heard. Pulmonary/Chest: Effort normal and breath sounds normal. No respiratory distress. She has no wheezes. She has no rales.  Abdominal: Soft. There is tenderness (epigastric ).  Actively vomiting during exam. Blood noted in vomit.   Musculoskeletal:       Cervical back: She exhibits normal range of motion, no tenderness and no bony tenderness.  Neurological: She is alert and oriented to person, place, and time. She has normal strength and normal reflexes. No cranial nerve deficit or sensory deficit. She displays a negative Romberg sign. Coordination and gait normal. GCS eye subscore is 4. GCS verbal subscore is 5. GCS motor subscore is 6.  Skin: Skin is warm and dry.  Psychiatric: She has a normal mood and affect.    ED Course  Procedures (including critical care time) Labs Review Labs Reviewed  COMPREHENSIVE METABOLIC PANEL - Abnormal; Notable for the following:    Potassium 3.4 (*)    Glucose, Bld 105 (*)    ALT 41 (*)    All other components within normal limits  CBC WITH DIFFERENTIAL  LIPASE, BLOOD   Imaging Review Ct Angio Head W/cm &/or Wo  Cm  05/10/2013   CLINICAL DATA:  Severe headache.  EXAM: CT ANGIOGRAPHY HEAD  TECHNIQUE: Multidetector CT imaging of the head was performed using the standard protocol during bolus administration of intravenous contrast. Multiplanar CT image reconstructions including MIPs were obtained to evaluate the vascular anatomy.  CONTRAST:  50mL OMNIPAQUE IOHEXOL 350 MG/ML SOLN  COMPARISON:  CT head earlier today  FINDINGS: Internal carotid arteries widely patent. Basilar artery widely patent. Both vertebrals contribute to basilar formation with the right dominant. There is no intracranial stenosis or aneurysm.  Post infusion imaging through the entire head demonstrates no abnormal enhancement of the brain or meninges.  Major dural venous sinuses appear patent.  Calvarium intact.  Review of the MIP images confirms the above findings.  IMPRESSION: Negative CTA of the intracranial circulation. No evidence for acute intracranial abnormality or vascular dissection.   Electronically Signed   By: Jonny Ruiz  Curnes M.D.   On: 05/10/2013 20:56   Ct Head Wo Contrast  05/10/2013   CLINICAL DATA:  Headache  EXAM: CT HEAD WITHOUT CONTRAST  TECHNIQUE: Contiguous axial images were obtained from the base of the skull through the vertex without intravenous contrast.  COMPARISON:  09/23/2011  FINDINGS: Ventricle size is normal. Negative for acute or chronic infarction. Negative for hemorrhage or fluid collection. Negative for mass or edema. No shift of the midline structures.  Calvarium is intact.  IMPRESSION: Normal   Electronically Signed   By: Marlan Palau M.D.   On: 05/10/2013 17:31   Dg Lumbar Puncture Fluoro Guide  05/10/2013   CLINICAL DATA:  Headache and neck pain.  EXAM: DIAGNOSTIC LUMBAR PUNCTURE UNDER FLUOROSCOPIC GUIDANCE  FLUOROSCOPY TIME:  0 min and 35 seconds.  PROCEDURE: Informed consent was obtained from the patient prior to the procedure, including potential complications of headache, allergy, and pain. With the  patient prone, the lower back was prepped with Betadine. 1% Lidocaine was used for local anesthesia. Lumbar puncture was performed at the L3-4 level using a gauge needle with return of blood-tinged/ pinkish CSF with an opening pressure of 23 cm water. 9ml of CSF were obtained for laboratory studies. The patient tolerated the procedure well and there were no apparent complications.  IMPRESSION: Fluoroscopic guided lumbar puncture with blood-tinged/pinkish CSF which did not clear.   Electronically Signed   By: Loralie Champagne M.D.   On: 05/10/2013 16:20    EKG Interpretation   None      2:43 PM Patient seen and examined. Previous work-up reviewed. D/w Dr. Criss Alvine. Work-up initiated. Medications ordered. Neg UPT 2 days ago.   Vital signs reviewed and are as follows: Filed Vitals:   05/12/13 1423  BP: 122/64  Pulse: 51  Temp: 98.2 F (36.8 C)  Resp: 18   3:15 PM Handoff to [employer].   Plan: obtain labs, re-eval after anti-emetics. Likely will need neuro consult and hospitalist admit for HA and intractable vomiting given 3rd visit in as many days.   MDM   1. Headache    Plan per above.     Renne Crigler, PA-C 05/12/13 1526

## 2013-05-12 NOTE — ED Notes (Signed)
Pt provided with socks upon discharge.

## 2013-05-12 NOTE — ED Notes (Signed)
PA at bedside.

## 2013-05-12 NOTE — ED Provider Notes (Signed)
Date: 05/11/2013  Rate: 70  Rhythm: normal sinus rhythm  QRS Axis: normal  Intervals: normal  ST/T Wave abnormalities: nonspecific ST changes  Conduction Disutrbances:none     Joya Gaskins, MD 05/12/13 0002

## 2013-05-12 NOTE — ED Notes (Signed)
Pt vomiting onto floor.  PA made aware.

## 2013-05-12 NOTE — ED Provider Notes (Signed)
I have personally seen and examined the patient.  I have discussed the plan of care with the resident.  I have reviewed the documentation on PMH/FH/Soc. History.  I have reviewed the documentation of the resident and agree.  Pt well appearing, no focal motor deficits (brieflly mentioned left hand numbness that resolved) Also reports CP while moving around in bed, no SOB She is well appearing I doubt acute neurologic process   Joya Gaskins, MD 05/12/13 0001

## 2013-05-12 NOTE — ED Notes (Signed)
EMS reports pt was seen here Tuesday for headache.  LP was done.  "It was cloudy and bloody."  Pt continues to have vomiting, headache, and now chest pain.  "My neck hurts."  Motrin last taken today at 0800. (vomited it back up)

## 2013-05-12 NOTE — ED Provider Notes (Signed)
Medical screening examination/treatment/procedure(s) were conducted as a shared visit with non-physician practitioner(s) and myself.  I personally evaluated the patient during the encounter.  EKG Interpretation     Ventricular Rate:  54 PR Interval:  110 QRS Duration: 100 QT Interval:  442 QTC Calculation: 419 R Axis:   47 Text Interpretation:  Sinus rhythm Atrial premature complex Borderline short PR interval No significant change since last tracing            Patient with continued headache, has had extensive w/u. Given her persistent sx and vomiting, will likely need neuro involvement if sx aren't controlled.   Audree Camel, MD 05/12/13 [phone]

## 2013-05-12 NOTE — ED Notes (Signed)
MD at bedside. 

## 2013-05-12 NOTE — ED Provider Notes (Signed)
Medical screening examination/treatment/procedure(s) were performed by non-physician practitioner and as supervising physician I was immediately available for consultation/collaboration.   Charles B. Sheldon, MD 05/12/13 2049 

## 2013-05-12 NOTE — ED Provider Notes (Signed)
Pt received in sign out from PA Geiple at shift change.  Pt with persistent headache, stiff neck, and vomiting x 3 days.  This is her third ED visit in 3 days for the same.  She has had an LP, CT head, and CTA at initial visit, all negative.  States sx never improved once returning home.  Seen again last night again and dx with post- LP headache.  Pt states headache has not really changed in character.  Labs obtained, fluids and meds given.  Will monitor and reassess.  Plan:  Labs pending.  If no improvement after fluids and meds may need to obtain neuro consult and possible hospital admission for intractable vomiting.  Results for orders placed during the hospital encounter of 05/12/13  CBC WITH DIFFERENTIAL      Result Value Range   WBC 7.3  4.0 - 10.5 K/uL   RBC 4.17  3.87 - 5.11 MIL/uL   Hemoglobin 13.6  12.0 - 15.0 g/dL   HCT 81.1  91.4 - 78.2 %   MCV 91.4  78.0 - 100.0 fL   MCH 32.6  26.0 - 34.0 pg   MCHC 35.7  30.0 - 36.0 g/dL   RDW 95.6  21.3 - 08.6 %   Platelets 232  150 - 400 K/uL   Neutrophils Relative % 64  43 - 77 %   Neutro Abs 4.7  1.7 - 7.7 K/uL   Lymphocytes Relative 27  12 - 46 %   Lymphs Abs 2.0  0.7 - 4.0 K/uL   Monocytes Relative 6  3 - 12 %   Monocytes Absolute 0.5  0.1 - 1.0 K/uL   Eosinophils Relative 2  0 - 5 %   Eosinophils Absolute 0.1  0.0 - 0.7 K/uL   Basophils Relative 0  0 - 1 %   Basophils Absolute 0.0  0.0 - 0.1 K/uL  COMPREHENSIVE METABOLIC PANEL      Result Value Range   Sodium 142  135 - 145 mEq/L   Potassium 3.4 (*) 3.5 - 5.1 mEq/L   Chloride 105  96 - 112 mEq/L   CO2 26  19 - 32 mEq/L   Glucose, Bld 105 (*) 70 - 99 mg/dL   BUN 10  6 - 23 mg/dL   Creatinine, Ser 5.78  0.50 - 1.10 mg/dL   Calcium 9.5  8.4 - 46.9 mg/dL   Total Protein 7.9  6.0 - 8.3 g/dL   Albumin 4.2  3.5 - 5.2 g/dL   AST 21  0 - 37 U/L   ALT 41 (*) 0 - 35 U/L   Alkaline Phosphatase 66  39 - 117 U/L   Total Bilirubin 0.6  0.3 - 1.2 mg/dL   GFR calc non Af Amer >90  >90  mL/min   GFR calc Af Amer >90  >90 mL/min  LIPASE, BLOOD      Result Value Range   Lipase 21  11 - 59 U/L   Ct Angio Head W/cm &/or Wo Cm  05/10/2013   CLINICAL DATA:  Severe headache.  EXAM: CT ANGIOGRAPHY HEAD  TECHNIQUE: Multidetector CT imaging of the head was performed using the standard protocol during bolus administration of intravenous contrast. Multiplanar CT image reconstructions including MIPs were obtained to evaluate the vascular anatomy.  CONTRAST:  50mL OMNIPAQUE IOHEXOL 350 MG/ML SOLN  COMPARISON:  CT head earlier today  FINDINGS: Internal carotid arteries widely patent. Basilar artery widely patent. Both vertebrals contribute to basilar formation with the  right dominant. There is no intracranial stenosis or aneurysm.  Post infusion imaging through the entire head demonstrates no abnormal enhancement of the brain or meninges.  Major dural venous sinuses appear patent.  Calvarium intact.  Review of the MIP images confirms the above findings.  IMPRESSION: Negative CTA of the intracranial circulation. No evidence for acute intracranial abnormality or vascular dissection.   Electronically Signed   By: Davonna Belling M.D.   On: 05/10/2013 20:56   Ct Head Wo Contrast  05/10/2013   CLINICAL DATA:  Headache  EXAM: CT HEAD WITHOUT CONTRAST  TECHNIQUE: Contiguous axial images were obtained from the base of the skull through the vertex without intravenous contrast.  COMPARISON:  09/23/2011  FINDINGS: Ventricle size is normal. Negative for acute or chronic infarction. Negative for hemorrhage or fluid collection. Negative for mass or edema. No shift of the midline structures.  Calvarium is intact.  IMPRESSION: Normal   Electronically Signed   By: Marlan Palau M.D.   On: 05/10/2013 17:31   Dg Lumbar Puncture Fluoro Guide  05/10/2013   CLINICAL DATA:  Headache and neck pain.  EXAM: DIAGNOSTIC LUMBAR PUNCTURE UNDER FLUOROSCOPIC GUIDANCE  FLUOROSCOPY TIME:  0 min and 35 seconds.  PROCEDURE: Informed  consent was obtained from the patient prior to the procedure, including potential complications of headache, allergy, and pain. With the patient prone, the lower back was prepped with Betadine. 1% Lidocaine was used for local anesthesia. Lumbar puncture was performed at the L3-4 level using a gauge needle with return of blood-tinged/ pinkish CSF with an opening pressure of 23 cm water. 9ml of CSF were obtained for laboratory studies. The patient tolerated the procedure well and there were no apparent complications.  IMPRESSION: Fluoroscopic guided lumbar puncture with blood-tinged/pinkish CSF which did not clear.   Electronically Signed   By: Loralie Champagne M.D.   On: 05/10/2013 16:20   5:07 PM Labs as above, no significant abnormalities.  Pt re-evaluated.  On entering pts room, she is asleep in room with all the lights on.  Awoke pt, states headache still rated 8/10.  When asked where her pain is she shrugs her shoulders and states "its kinda in my forehead and my neck".  She denies change in her sx from onset 3 days ago.  Questioned pt about prior neurology evaluation, she again falls asleep.  Awoke her again, states she is unsure if she has ever seen neurology.  No further vomiting while in the ED.  pts neuro exam remains normal without focal neuro deficits.  At this time, i do not feel that she needs emergent neurology evaluation or hospital admission as her vomiting as resolved and she has been sleeping comfortably in her room.  Pt was given referral to [facility] so she may FU on an OP basis.  Rx zofran.  Discussed plan with pt, she agreed.  Return precautions advised.  Discussed with Dr. Bernette Mayers who agrees with assessment and plan of care.  Garlon Hatchet, PA-C 05/12/13 1927

## 2013-05-12 NOTE — ED Notes (Addendum)
Pt c/o pain that is unchanged.  Pt lying in the bed, unable to stay awake while having conversation with RN.  PA Allyne Gee made aware.

## 2013-05-14 LAB — CSF CULTURE: Culture: NO GROWTH

## 2013-05-28 ENCOUNTER — Encounter (HOSPITAL_COMMUNITY): Payer: Self-pay | Admitting: Emergency Medicine

## 2013-05-28 ENCOUNTER — Emergency Department (HOSPITAL_COMMUNITY): Payer: Self-pay

## 2013-05-28 ENCOUNTER — Emergency Department (HOSPITAL_COMMUNITY)
Admission: EM | Admit: 2013-05-28 | Discharge: 2013-05-28 | Disposition: A | Discharge status: Home / Self Care | Payer: Self-pay | Attending: Emergency Medicine | Admitting: Emergency Medicine

## 2013-05-28 DIAGNOSIS — R112 Nausea with vomiting, unspecified: Secondary | ICD-10-CM | POA: Insufficient documentation

## 2013-05-28 DIAGNOSIS — Z3202 Encounter for pregnancy test, result negative: Secondary | ICD-10-CM | POA: Insufficient documentation

## 2013-05-28 DIAGNOSIS — J45901 Unspecified asthma with (acute) exacerbation: Secondary | ICD-10-CM | POA: Insufficient documentation

## 2013-05-28 DIAGNOSIS — R002 Palpitations: Secondary | ICD-10-CM | POA: Insufficient documentation

## 2013-05-28 DIAGNOSIS — Z79899 Other long term (current) drug therapy: Secondary | ICD-10-CM | POA: Insufficient documentation

## 2013-05-28 DIAGNOSIS — F419 Anxiety disorder, unspecified: Secondary | ICD-10-CM

## 2013-05-28 DIAGNOSIS — Z8679 Personal history of other diseases of the circulatory system: Secondary | ICD-10-CM | POA: Insufficient documentation

## 2013-05-28 DIAGNOSIS — F411 Generalized anxiety disorder: Secondary | ICD-10-CM | POA: Insufficient documentation

## 2013-05-28 DIAGNOSIS — Z8619 Personal history of other infectious and parasitic diseases: Secondary | ICD-10-CM | POA: Insufficient documentation

## 2013-05-28 DIAGNOSIS — F17200 Nicotine dependence, unspecified, uncomplicated: Secondary | ICD-10-CM | POA: Insufficient documentation

## 2013-05-28 DIAGNOSIS — Z87891 Personal history of nicotine dependence: Secondary | ICD-10-CM | POA: Insufficient documentation

## 2013-05-28 LAB — CBC
HCT: 38.4 % (ref 36.0–46.0)
MCHC: 36.7 g/dL — ABNORMAL HIGH (ref 30.0–36.0)
Platelets: 336 10*3/uL (ref 150–400)
RDW: 12.7 % (ref 11.5–15.5)

## 2013-05-28 LAB — BASIC METABOLIC PANEL
BUN: 12 mg/dL (ref 6–23)
Creatinine, Ser: 0.77 mg/dL (ref 0.50–1.10)
GFR calc Af Amer: >90 mL/min (ref >90)
GFR calc non Af Amer: >90 mL/min (ref >90)
Potassium: 3.4 mEq/L — ABNORMAL LOW (ref 3.5–5.1)
Sodium: 139 mEq/L (ref 135–145)

## 2013-05-28 LAB — POCT PREGNANCY, URINE: Preg Test, Ur: NEGATIVE

## 2013-05-28 LAB — POCT I-STAT TROPONIN I: Troponin i, poc: 0 ng/mL (ref 0.00–0.08)

## 2013-05-28 MED ORDER — SODIUM CHLORIDE 0.9 % IV BOLUS (SEPSIS)
1000.0000 mL | Freq: Once | INTRAVENOUS | Status: AC
  Administered 2013-05-28: 1000 mL via INTRAVENOUS

## 2013-05-28 MED ORDER — LORAZEPAM 2 MG/ML IJ SOLN
1.0000 mg | Freq: Once | INTRAMUSCULAR | Status: AC
  Administered 2013-05-28: 1 mg via INTRAVENOUS
  Filled 2013-05-28: qty 1

## 2013-05-28 NOTE — ED Notes (Signed)
Pt discharged home with all belongings, pt alert and ambulatory upon discharge, no new RX prescribed, pt verbalizes understanding of discharge instructions, secretary Ralice called a cab for pt

## 2013-05-28 NOTE — ED Provider Notes (Signed)
CSN: [id number]     Arrival date & time 05/28/13  4098 History   First MD Initiated Contact with Patient 05/28/13 [phone]     Chief Complaint  Patient presents with  . Palpitations  . Shortness of Breath   (Consider location/radiation/quality/duration/timing/severity/associated sxs/prior Treatment) The history is provided by the patient.   Patienb reports she was awoken from sleep with palpitations, chest tightness, palms sweating, difficulty breathing.  States the chest discomfort is described as tightness and her chest feeling "hot."  She has had two episodes of nausea and vomiting.  States that she has had panic attacks that are exactly like this with the palpitations and the feeling of heat and tightness in her chest but she usually does not get SOB. Pt states she drank 8 beers last night but that that is not abnormal for her.  Denies drinking every day.  Denies recent immobilization, exogenous estrogen use, personal or family hx blood clot. Denies cough, leg swelling, fevers, headache, cough, abdominal pain, bowel/vaginal/urinary symptoms.  Past Medical History  Diagnosis Date  . Asthma   . Herpes simplex of female genitalia     last outbreak 4 months ago  . Anxiety   . Depression   . Migraines    Past Surgical History  Procedure Laterality Date  . No past surgeries     No family history on file. History  Substance Use Topics  . Smoking status: Former Smoker -- 0.25 packs/day    Types: Cigarettes    Quit date: 05/02/2013  . Smokeless tobacco: Not on file  . Alcohol Use: Yes     Comment: occ   OB History   Grav Para Term Preterm Abortions TAB SAB Ect Mult Living   2 2 2  0 0 0 0 0 0 2     Review of Systems  Constitutional: Negative for fever and chills.  Respiratory: Positive for chest tightness and shortness of breath. Negative for cough.   Cardiovascular: Negative for chest pain.  Gastrointestinal: Negative for nausea, vomiting, abdominal pain and diarrhea.   Genitourinary: Negative for dysuria, urgency, frequency, vaginal bleeding and vaginal discharge.    Allergies  Shellfish allergy  Home Medications   Current Outpatient Rx  Name  Route  Sig  Dispense  Refill  . albuterol (PROVENTIL HFA;VENTOLIN HFA) 108 (90 BASE) MCG/ACT inhaler   Inhalation   Inhale 1-2 puffs into the lungs every 6 (six) hours as needed for wheezing.   1 Inhaler   0    BP 117/68  Pulse 75  Temp(Src) 98 F (36.7 C) (Oral)  Resp 17  Ht 5\' 5"  (1.651 m)  Wt 150 lb 9.6 oz (68.312 kg)  BMI 25.06 kg/m2  SpO2 100% Physical Exam  Nursing note and vitals reviewed. Constitutional: She appears well-developed and well-nourished. No distress.  HENT:  Head: Normocephalic and atraumatic.  Neck: Neck supple.  Cardiovascular: Normal rate and regular rhythm.   Pulmonary/Chest: Effort normal and breath sounds normal. No respiratory distress. She has no wheezes. She has no rales.  Abdominal: Soft. She exhibits no distension. There is no tenderness. There is no rebound and no guarding.  Neurological: She is alert.  Skin: She is not diaphoretic.    ED Course  Procedures (including critical care time) Labs Review Labs Reviewed  CBC - Abnormal; Notable for the following:    MCHC 36.7 (*)    All other components within normal limits  BASIC METABOLIC PANEL - Abnormal; Notable for the following:    Potassium  3.4 (*)    All other components within normal limits  POCT I-STAT TROPONIN I  POCT PREGNANCY, URINE   Imaging Review Dg Chest 2 View  05/28/2013   CLINICAL DATA:  Shortness of breath.  EXAM: CHEST  2 VIEW  COMPARISON:  04/07/2013.  FINDINGS: The heart size and mediastinal contours are within normal limits. Both lungs are clear. The visualized skeletal structures are unremarkable.  IMPRESSION: No active cardiopulmonary disease.   Electronically Signed   By: Maisie Fus  Register   On: 05/28/2013 11:38    EKG Interpretation   None      12:13 PM Patient sleeping.   Currently denies any CP, SOB.    MDM   1. Anxiety   2. Palpitations    Pt with palpitations, chest burning/tightness, palms sweating, SOB, improved with ativan.  Stated this was similar to prior anxiety attacks.  I suspect that her heavy alcohol usage (8 beers last night) may be contributing to her symptoms.  She was given ativan and IVF here with complete relief of her symptoms.  Labs including troponin unremarkable.  CXR negative.  No PE risk factors. Discussed result, findings, treatment, and follow up  with patient.  Pt given return precautions.  Pt verbalizes understanding and agrees with plan.      I doubt any other EMC precluding discharge at this time including, but not necessarily limited to the following: ACS, PE    Trixie Dredge, PA-C 05/28/13 1327

## 2013-05-28 NOTE — ED Notes (Signed)
Pt reports waking 0730 this am with sudden onset of non radiating mid-sternum chest pain, SOB, nausea, and vomiting x2. Pt states it feels like someone is stepping on her chest, pt was not sure if it was a panic attacks but states she was unable to calm down this time and breathe through it, states "the pain just kept getting tighter and tighter."

## 2013-05-28 NOTE — ED Notes (Signed)
Patient transported to X-ray 

## 2013-05-28 NOTE — ED Notes (Signed)
Pt states she went out last nite and did drink etoh but only had 7 beers.  Pt states she was woke with heart racing and since has vomited twice.  Pt states she thought it was a panic attack, but states her chest feels hot, lightheaded at one point, and chest is tight.  SR 85.

## 2013-06-01 NOTE — ED Provider Notes (Signed)
Medical screening examination/treatment/procedure(s) were performed by non-physician practitioner and as supervising physician I was immediately available for consultation/collaboration.  EKG Interpretation    Date/Time:  Saturday May 28 2013 09:37:29 EST Ventricular Rate:  86 PR Interval:  138 QRS Duration: 86 QT Interval:  396 QTC Calculation: 473 R Axis:   58 Text Interpretation:  Normal sinus rhythm Normal ECG ED PHYSICIAN INTERPRETATION AVAILABLE IN [facility] Confirmed by TEST, RECORD ([id number]) on 05/30/2013 7:51:43 AM             Raeford Razor, MD 06/01/13 [phone]

## 2013-07-02 ENCOUNTER — Emergency Department (HOSPITAL_COMMUNITY)
Admission: EM | Admit: 2013-07-02 | Discharge: 2013-07-02 | Discharge status: Against Medical Advice | Payer: Self-pay | Attending: Emergency Medicine | Admitting: Emergency Medicine

## 2013-07-02 ENCOUNTER — Encounter (HOSPITAL_COMMUNITY): Payer: Self-pay | Admitting: Emergency Medicine

## 2013-07-02 DIAGNOSIS — R079 Chest pain, unspecified: Secondary | ICD-10-CM | POA: Insufficient documentation

## 2013-07-02 DIAGNOSIS — R0602 Shortness of breath: Secondary | ICD-10-CM | POA: Insufficient documentation

## 2013-07-02 NOTE — ED Notes (Signed)
Called 2 times pt did not answer.

## 2013-07-02 NOTE — ED Notes (Signed)
She c/o left lower thoracic pain, coupled with "fullness-type feeling" in left arm with feeling shortness of breath; x 2 days.  She states she has experienced this symptomology monthly x several months.  She has been seen a few times at [facility] for this and establishing diagnosis has thus far been unequivocal.  Her skin is normal, warm and dry and she is breathing normally.

## 2013-12-04 ENCOUNTER — Encounter (HOSPITAL_COMMUNITY): Payer: Self-pay | Admitting: Emergency Medicine

## 2013-12-04 ENCOUNTER — Emergency Department (HOSPITAL_COMMUNITY)
Admission: EM | Admit: 2013-12-04 | Discharge: 2013-12-04 | Disposition: A | Discharge status: Home / Self Care | Payer: Self-pay | Attending: Emergency Medicine | Admitting: Emergency Medicine

## 2013-12-04 DIAGNOSIS — Z9861 Coronary angioplasty status: Secondary | ICD-10-CM | POA: Insufficient documentation

## 2013-12-04 DIAGNOSIS — F410 Panic disorder [episodic paroxysmal anxiety] without agoraphobia: Secondary | ICD-10-CM

## 2013-12-04 DIAGNOSIS — F1010 Alcohol abuse, uncomplicated: Secondary | ICD-10-CM | POA: Insufficient documentation

## 2013-12-04 DIAGNOSIS — G43909 Migraine, unspecified, not intractable, without status migrainosus: Secondary | ICD-10-CM | POA: Insufficient documentation

## 2013-12-04 DIAGNOSIS — F411 Generalized anxiety disorder: Secondary | ICD-10-CM | POA: Insufficient documentation

## 2013-12-04 DIAGNOSIS — F3289 Other specified depressive episodes: Secondary | ICD-10-CM | POA: Insufficient documentation

## 2013-12-04 DIAGNOSIS — J45909 Unspecified asthma, uncomplicated: Secondary | ICD-10-CM | POA: Insufficient documentation

## 2013-12-04 DIAGNOSIS — F329 Major depressive disorder, single episode, unspecified: Secondary | ICD-10-CM | POA: Insufficient documentation

## 2013-12-04 DIAGNOSIS — A6000 Herpesviral infection of urogenital system, unspecified: Secondary | ICD-10-CM | POA: Insufficient documentation

## 2013-12-04 MED ORDER — PROMETHAZINE HCL 25 MG PO TABS: 25.0000 mg | ORAL_TABLET | Freq: Four times a day (QID) | ORAL | Status: DC | PRN

## 2013-12-04 NOTE — ED Provider Notes (Signed)
Medical screening examination/treatment/procedure(s) were performed by non-physician practitioner and as supervising physician I was immediately available for consultation/collaboration.   EKG Interpretation None        Mariea Clonts, MD 12/04/13 [phone]

## 2013-12-04 NOTE — ED Notes (Signed)
Pt. reports panic/anxiety attack onset yesterday after drinking  bottle of alcohol , denies suicidal ideation .

## 2013-12-04 NOTE — ED Notes (Signed)
Pt ambulatory at discharge, d/c teaching done

## 2013-12-04 NOTE — ED Provider Notes (Signed)
CSN: [id number]     Arrival date & time 12/04/13  0439 History   First MD Initiated Contact with Patient 12/04/13 [phone]     Chief Complaint  Patient presents with  . Panic Attack  . Anxiety     (Consider location/radiation/quality/duration/timing/severity/associated sxs/prior Treatment) HPI  23 year old female with history of anxiety depression presents for evaluations of an anxiety attack. Patient reports she was recently got out of jail. Last night she decided to drink alcohol "just fine". States she drink a bottle of liquor, unsure what size and also drank beer by herself. This morning she woke up feeling intoxicated, vomited 3 times, and also has bouts of diarrhea. She became very nervous and that she may have alcohol poisoning and decided to come to ER for further evaluation. She has not been actively vomiting in the ER. She denies any street drug use with alcohol. She denies SI/HI/hallucination. She admits to having history of anxiety and states that her recent behavior actually  making her more anxious. She requests for antianxiety medication.  She did report having sensation of chest tightness and tingling sensation around her lips. No significant shortness of breath, abdominal pain, back pain dysuria.  Past Medical History  Diagnosis Date  . Asthma   . Herpes simplex of female genitalia     last outbreak 4 months ago  . Anxiety   . Depression   . Migraines    Past Surgical History  Procedure Laterality Date  . No past surgeries     No family history on file. History  Substance Use Topics  . Smoking status: Former Smoker -- 0.25 packs/day    Types: Cigarettes    Quit date: 05/02/2013  . Smokeless tobacco: Not on file  . Alcohol Use: Yes     Comment: occ   OB History   Grav Para Term Preterm Abortions TAB SAB Ect Mult Living   2 2 2  0 0 0 0 0 0 2     Review of Systems  All other systems reviewed and are negative.     Allergies  Shellfish allergy  Home  Medications   Prior to Admission medications   Medication Sig Start Date End Date Taking? Authorizing Provider  albuterol (PROVENTIL HFA;VENTOLIN HFA) 108 (90 BASE) MCG/ACT inhaler Inhale 1-2 puffs into the lungs every 6 (six) hours as needed for wheezing. 04/07/13   Elyn Peers, MD   BP 149/82  Pulse 87  Temp(Src) 97.9 F (36.6 C) (Oral)  Resp 20  Ht 5\' 1"  (1.549 m)  Wt 160 lb (72.576 kg)  BMI 30.25 kg/m2  SpO2 99% Physical Exam  Nursing note and vitals reviewed. Constitutional: She appears well-developed and well-nourished. No distress.  HENT:  Head: Atraumatic.  Eyes: Conjunctivae are normal.  Neck: Neck supple.  Cardiovascular: Normal rate and regular rhythm.   Pulmonary/Chest: Effort normal and breath sounds normal.  Abdominal: Soft. There is no tenderness.  Neurological: She is alert. She has normal strength. GCS eye subscore is 4. GCS verbal subscore is 5. GCS motor subscore is 6.  Skin: No rash noted.  Psychiatric: She has a normal mood and affect. Her speech is normal and behavior is normal. Thought content is not paranoid. Cognition and memory are normal. She expresses no homicidal and no suicidal ideation.    ED Course  Procedures (including critical care time)  6:19 AM Patient presents complaining of being anxious and worried about alcohol poisoning. She is mentating appropriately, having minimal abdominal tenderness, and is not  actively vomiting. She is afebrile stable normal vital signs. She admits that she should not have been treating such a large amount and denies having a strong history of alcohol abuse. At this time the patient is stable for discharge.  I recommend avoiding binge drinking. We'll provide antinausea medication to use as needed. Return precautions discussed.   Labs Review Labs Reviewed - No data to display  Imaging Review No results found.   EKG Interpretation None      MDM   Final diagnoses:  Panic attack  Alcohol abuse    BP  149/82  Pulse 87  Temp(Src) 97.9 F (36.6 C) (Oral)  Resp 20  Ht 5\' 1"  (1.549 m)  Wt 160 lb (72.576 kg)  BMI 30.25 kg/m2  SpO2 99%     Domenic Moras, PA-C 12/04/13 [phone]

## 2013-12-04 NOTE — Discharge Instructions (Signed)
Panic Attacks Panic attacks are sudden, short feelings of great fear or discomfort. You may have them for no reason when you are relaxed, when you are uneasy (anxious), or when you are sleeping.  HOME CARE  Take all your medicines as told.  Check with your doctor before starting new medicines.  Keep all doctor visits. GET HELP IF:  You are not able to take your medicines as told.  Your symptoms do not get better.  Your symptoms get worse. GET HELP RIGHT AWAY IF:  Your attacks seem different than your normal attacks.  You have thoughts about hurting yourself or others.  You take panic attack medicine and you have a side effect. MAKE SURE YOU:  Understand these instructions.  Will watch your condition.  Will get help right away if you are not doing well or get worse. Document Released: 07/19/2010 Document Revised: 04/06/2013 Document Reviewed: 01/28/2013 [facility] Patient Information 2014 [place], [place].   Emergency Department Resource Guide 1) Find a Doctor and Pay Out of Pocket Although you won't have to find out who is covered by your insurance plan, it is a good idea to ask around and get recommendations. You will then need to call the office and see if the doctor you have chosen will accept you as a new patient and what types of options they offer for patients who are self-pay. Some doctors offer discounts or will set up payment plans for their patients who do not have insurance, but you will need to ask so you aren't surprised when you get to your appointment.  2) Contact Your Local Health Department Not all health departments have doctors that can see patients for sick visits, but many do, so it is worth a call to see if yours does. If you don't know where your local health department is, you can check in your phone book. The CDC also has a tool to help you locate your state's health department, and many state websites also have listings of all of their local health  departments.  3) Find a [facility] If your illness is not likely to be very severe or complicated, you may want to try a walk in clinic. These are popping up all over the country in pharmacies, drugstores, and shopping centers. They're usually staffed by nurse practitioners or physician assistants that have been trained to treat common illnesses and complaints. They're usually fairly quick and inexpensive. However, if you have serious medical issues or chronic medical problems, these are probably not your best option.  No Primary Care Doctor: - Call Health Connect at  [phone] - they can help you locate a primary care doctor that  accepts your insurance, provides certain services, etc. - Physician Referral Service- [phone]  Chronic Pain Problems: Organization         Address  Phone   Notes  [facility]  [phone] Patients need to be referred by their primary care doctor.   Medication Assistance: Organization         Address  Phone   Notes  [facility] Medication [facility] [address] [place], [place] [address] [phone] --Must be a resident of [facility] -- Must have NO insurance coverage whatsoever (no Medicaid/ Medicare, etc.) -- The pt. MUST have a primary care doctor that directs their care regularly and follows them in the community   MedAssist  [phone]   [employer]  [phone]    Agencies that  provide inexpensive medical care: Organization         Address  Phone   Notes  [place]  [phone]   [facility]    [phone]   [facility] [place], [place] [address] [phone]   [place] [address] [place] [phone]   Planned Parenthood    [phone]   [facility]    [phone]   [place] and [address], [place] Phone:  [phone], Fax:  [phone] Hours of Operation:  9 am - 6 pm, M-F.  Also accepts Medicaid/Medicare and self-pay.  [facility] for [place], [address], [place] Phone: [phone], Fax: [phone]. Hours of Operation:  8:30 am - 5:30 pm, M-F.  Also accepts Medicaid and self-pay.  [facility] [address] [place] Phone: [phone]   [place], [place], [place] [phone], Ext. [phone] Mondays & Thursdays: 7-9 AM.  First 15 patients are seen on a first come, first serve basis.    [place] Providers:  Organization         Address  Phone   Notes  [facility] [address], [place] [phone] Also accepts self-pay patients.  [facility] [address], [place]  [phone]   [place], [address], [place] [phone]   [facility] [address], [place] [phone]   Lucianne Lei [address], [place]   [phone] Only accepts [place] Access [place] patients after they have their name applied to their card.   Self-Pay (no insurance) in [facility]:  Organization         Address  Phone   Notes  Sickle Cell Patients, [facility] [place] [phone]   [facility] [place] [phone]   [facility] [place]  [place], [place], [place] [phone]   [facility]/Dr. Osei-Bonsu  [address], [place] or [address], [place] [phone] Phone number for both [place] and [place] locations is the same.  Urgent Medical and [facility] [address] [place] [phone]   [facility] [address] [place] or [address] [phone]   [facility] [address], [place] [phone], phone; [phone], fax Sees patients 1st and 3rd Saturday of every month.  Must not qualify for public or private insurance (i.e. Medicaid, Medicare, [place] Health Choice, Veterans' Benefits)  Household income should be no more than 200% of the poverty level The clinic cannot treat you if you are pregnant or think you are pregnant  Sexually transmitted diseases are not treated at the clinic.    Dental Care: Organization         Address  Phone  Notes  [facility] Department of [facility] [place] [phone] Accepts children up to age 55 who are enrolled in [place] or [place]; pregnant women with a Medicaid card; and children who have applied for Medicaid or [place]  Choice, but were declined, whose parents can pay a reduced fee at time of service.  [facility] Department of [facility]  [address], [place] [phone] Accepts children up to age 23 who are enrolled in [place] or [place]; pregnant women with a Medicaid card; and children who have applied for Medicaid or [place] Health Choice, but were declined, whose parents can pay a reduced fee at time of service.  [place] Adult Dental Access PROGRAM  [place] [phone] Patients are seen by appointment only. Walk-ins are not accepted. [place] will see patients 26 years of age and older. Monday - Tuesday (8am-5pm) Most Wednesdays (8:30-5pm) $30 per visit, cash only  [facility] Adult Dental Access PROGRAM  [address], [facility] [phone] Patients are seen by appointment only. Walk-ins are not accepted. [place] will see patients 38 years of age and older. One Wednesday Evening (Monthly: Volunteer Based).  $30 per visit, cash only  [place]  [phone] for adults;  Children under age 41, call Graduate Pediatric Dentistry at [phone]. Children aged 32-14, please call [phone] to request a pediatric application.  Dental services are provided in all areas of dental care including fillings, crowns and bridges, complete and partial dentures, implants, gum treatment, root canals, and extractions. Preventive care is also provided. Treatment is provided to both adults and children. Patients are selected via a lottery and there is often a waiting list.   [facility] [address], [place]  [phone] [email or web address]   [facility] [address] [place], [place] [phone], Ext. [phone] Second and Fourth Thursday of each month, opens at 6:30 AM; Clinic ends at 9 AM.  Patients are seen on a first-come first-served basis, and a limited number are seen during each clinic.   [facility]  [address] Hillard Danker [place], [place] [phone]   Eligibility Requirements You must have lived in [place], [place], or [place] counties for at least the last three months.   You cannot be eligible for state or federal sponsored [employer], including [employer], [place], or [employer].   You generally cannot be eligible for healthcare insurance through your employer.    How to apply: Eligibility screenings are held every Tuesday and Wednesday afternoon from 1:00 pm until 4:00 pm. You do not need an appointment for the interview!  [facility] [address], [place], [place]   [place]  [place] Department  [place]  [phone]    Behavioral Health Resources in the Community: Intensive Outpatient Programs Organization         Address  Phone  Notes  [place]. [address], [place], [place] [phone]   [facility] Outpatient [address], [place], [place]   ADS: Alcohol & Drug Svcs [address] [place], [place]   [place] [address],  [place], [place] or [phone]   Substance Abuse Resources Organization         Address  Phone  Notes  Alcohol and Drug Services  [phone]   [facility]  [phone]   [facility] [place]  [phone]   Chinita Pester  [phone]   Residential & Outpatient Substance Abuse Program  [phone]  Psychological Services Organization         Address  Phone  Notes  [facility] [place]  [place]  [phone]   [place] [address], [place] or [phone]    Mobile Crisis Teams Organization         Address  Phone  Notes  [facility], Mobile Crisis Care Unit  [phone]   [facility]  [address] [place], [place]   [place] [address] [place] [phone]    Self-Help/Support Groups Organization         Address  Phone             Notes  [place]. of [place] - variety of support groups  [place] Call for more information  Narcotics Anonymous (NA), [facility] [address], [employer]  2 meetings at this location   Special educational needs teacher         Address  Phone  Notes  [facility],    [place]  [phone]   [facility]  [address] [place] [phone], [place], [place]   [place], [place] [phone] Admissions: 8am-3pm M-F  Incentives Substance [place] [address]    [place], [place] [phone]   [facility] [address] [place], [place], [place]   [facility] [address]  [place], [place]   [facility]ight Programs - Intensive  Outpatient [address], [place], [place]   [facility] ([place].) [place].,  [place], [place] [phone] or [phone]   [facility] (RTS) [address], [place], [place] Accepts Medicaid  [facility] [place] [address]  [place] [phone] Substance Abuse/Addiction Treatment   [facility] Organization         Address  Phone  Notes  [facility]  [phone]   Domenic Schwab, PhD [address] Arlis Porta [place], [place]   [phone] or [phone]   [place], [place] [phone]   [facility] [address], [place], [place] [phone] Insurance/Medicaid/sponsorship through [facility] and Families [address]                                    [place], [place] [phone] [place] [address], [place] [phone]    Dr. Adele Schilder  [phone]   [facility] Dept. 1) [address], [place] 2) [place] 3)  [place] [address], [place] [phone]  [phone]   [place] [phone] or [phone] (After Hours)

## 2014-01-14 ENCOUNTER — Encounter (HOSPITAL_COMMUNITY): Payer: Self-pay | Admitting: Emergency Medicine

## 2014-01-14 ENCOUNTER — Emergency Department (HOSPITAL_COMMUNITY)
Admission: EM | Admit: 2014-01-14 | Discharge: 2014-01-14 | Disposition: A | Discharge status: Home / Self Care | Payer: Self-pay | Attending: Emergency Medicine | Admitting: Emergency Medicine

## 2014-01-14 DIAGNOSIS — Z8679 Personal history of other diseases of the circulatory system: Secondary | ICD-10-CM | POA: Insufficient documentation

## 2014-01-14 DIAGNOSIS — Z79899 Other long term (current) drug therapy: Secondary | ICD-10-CM | POA: Insufficient documentation

## 2014-01-14 DIAGNOSIS — Y929 Unspecified place or not applicable: Secondary | ICD-10-CM | POA: Insufficient documentation

## 2014-01-14 DIAGNOSIS — S39012A Strain of muscle, fascia and tendon of lower back, initial encounter: Secondary | ICD-10-CM

## 2014-01-14 DIAGNOSIS — J45909 Unspecified asthma, uncomplicated: Secondary | ICD-10-CM | POA: Insufficient documentation

## 2014-01-14 DIAGNOSIS — Z8619 Personal history of other infectious and parasitic diseases: Secondary | ICD-10-CM | POA: Insufficient documentation

## 2014-01-14 DIAGNOSIS — X500XXA Overexertion from strenuous movement or load, initial encounter: Secondary | ICD-10-CM | POA: Insufficient documentation

## 2014-01-14 DIAGNOSIS — Z8659 Personal history of other mental and behavioral disorders: Secondary | ICD-10-CM | POA: Insufficient documentation

## 2014-01-14 DIAGNOSIS — Z87891 Personal history of nicotine dependence: Secondary | ICD-10-CM | POA: Insufficient documentation

## 2014-01-14 DIAGNOSIS — S335XXA Sprain of ligaments of lumbar spine, initial encounter: Secondary | ICD-10-CM | POA: Insufficient documentation

## 2014-01-14 DIAGNOSIS — Y9389 Activity, other specified: Secondary | ICD-10-CM | POA: Insufficient documentation

## 2014-01-14 MED ORDER — TRAMADOL HCL 50 MG PO TABS: 50.0000 mg | ORAL_TABLET | Freq: Four times a day (QID) | ORAL | Status: DC | PRN

## 2014-01-14 MED ORDER — KETOROLAC TROMETHAMINE 30 MG/ML IJ SOLN
30.0000 mg | Freq: Once | INTRAMUSCULAR | Status: AC
  Administered 2014-01-14: 30 mg via INTRAMUSCULAR
  Filled 2014-01-14: qty 1

## 2014-01-14 MED ORDER — IBUPROFEN 800 MG PO TABS: 800.0000 mg | ORAL_TABLET | Freq: Three times a day (TID) | ORAL | Status: DC

## 2014-01-14 MED ORDER — CYCLOBENZAPRINE HCL 10 MG PO TABS: 10.0000 mg | ORAL_TABLET | Freq: Every day | ORAL | Status: DC

## 2014-01-14 NOTE — ED Provider Notes (Signed)
CSN: [id number]     Arrival date & time 01/14/14  1818 History  This chart was scribed for non-physician practitioner working with Babette Relic, MD by Mercy Moore, ED Scribe. This patient was seen in room TR06C/TR06C and the patient's care was started at 7:08 PM.   Chief Complaint  Patient presents with  . Back Pain     HPI Comments: Katrina [name] is a 23 y.o. female who presents to the Emergency Department complaining with a back injury that occurred last night. Patient reports bending to pick up an object while cleaning and hearing a pop in her lower back when standing. Since patient reports experiencing gradually worsening lower back pain since. Patient states that her pain is so severe she has been unable to sleep. Patient reports exacerbated pain with movement, standing, sitting and walking and alleviation only with lying down. Patient reports treatment with Tylenol and hot compresses, without relief. Patient is ambulatory.  Patient denies bladder or bowel incontinence or changes in habits. Patient denies history of similar back pain. Patient denies fall.  LMP ended 5 days ago.  No PCP.   The history is provided by the patient. No language interpreter was used.    Past Medical History  Diagnosis Date  . Asthma   . Herpes simplex of female genitalia     last outbreak 4 months ago  . Anxiety   . Depression   . Migraines    Past Surgical History  Procedure Laterality Date  . No past surgeries     History reviewed. No pertinent family history. History  Substance Use Topics  . Smoking status: Former Smoker -- 0.25 packs/day    Types: Cigarettes    Quit date: 05/02/2013  . Smokeless tobacco: Not on file  . Alcohol Use: Yes     Comment: occ   OB History   Grav Para Term Preterm Abortions TAB SAB Ect Mult Living   2 2 2  0 0 0 0 0 0 2     Review of Systems  Constitutional: Negative for fever and chills.  Gastrointestinal: Negative for nausea, vomiting, abdominal pain and  diarrhea.  Genitourinary: Negative for dysuria, frequency, hematuria, enuresis and difficulty urinating.  Musculoskeletal: Positive for back pain.  Neurological: Negative for weakness and numbness.      Allergies  Shellfish allergy  Home Medications   Prior to Admission medications   Medication Sig Start Date End Date Taking? Authorizing Provider  albuterol (PROVENTIL HFA;VENTOLIN HFA) 108 (90 BASE) MCG/ACT inhaler Inhale 1-2 puffs into the lungs every 6 (six) hours as needed for wheezing. 04/07/13   Elyn Peers, MD  promethazine (PHENERGAN) 25 MG tablet Take 1 tablet (25 mg total) by mouth every 6 (six) hours as needed for nausea. 12/04/13   Domenic Moras, PA-C   Triage Vitals: BP 132/76  Pulse 89  Temp(Src) 98 F (36.7 C) (Oral)  Resp 16  Ht 5\' 1"  (1.549 m)  Wt 163 lb (73.936 kg)  BMI 30.81 kg/m2  SpO2 98% Physical Exam  Nursing note and vitals reviewed. Constitutional: She is oriented to person, place, and time. She appears well-developed and well-nourished. She is cooperative. She does not have a sickly appearance. She does not appear ill. No distress.  Appears uncomfortable  HENT:  Head: Normocephalic and atraumatic.  Eyes: EOM are normal.  Neck: Neck supple.  Cardiovascular: Normal rate.   Pulmonary/Chest: Effort normal. No respiratory distress.  Musculoskeletal: Normal range of motion.       Lumbar  back: She exhibits tenderness. She exhibits normal range of motion.       Back:  Mild midline L-spine tenderness with no step-offs, crepitus, or deformities noted  No midline C-spine, T-spine tenderness with no step-offs, crepitus, or deformities noted. Mild lower para vertebral tenderness to palpation no obvious spasm or deformity. Good and equal strength and sensation to bilareal lower extremities.  Neurological: She is alert and oriented to person, place, and time.  Skin: Skin is warm and dry. She is not diaphoretic.  Psychiatric: She has a normal mood and affect. Her  behavior is normal.    ED Course  Procedures (including critical care time) COORDINATION OF CARE: 7:08 PM- Discussed treatment plan with patient at bedside and patient agreed to plan.     MDM   Final diagnoses:  Lumbar strain, initial encounter   Patient with back pain.  Patient has midline tenderness increased paravertebral tenderness denies history of injury. No neurological deficits and normal neuro exam. Patient is able to bear weight. No loss of bowel or bladder control.  No concern for cauda equina.  No fever, night sweats, weight loss, h/o cancer, IVDU.  RICE protocol and pain medicine indicated and discussed with patient.  Patient has midline tenderness however this is a nontraumatic back injury without neurologic findings on exam and normal strength normal sensation don't feel imaging is necessary at this time, canceled his previous ordered by RN, also canceled urine pregnancy and urinalysis with no urinary complaints and onset of pain after bending over. Likely Muscular in nature.  Meds given in ED:  Medications  ketorolac (TORADOL) 30 MG/ML injection 30 mg (30 mg Intramuscular Given 01/14/14 1928)    Discharge Medication List as of 01/14/2014  7:44 PM    START taking these medications   Details  cyclobenzaprine (FLEXERIL) 10 MG tablet Take 1 tablet (10 mg total) by mouth at bedtime., Starting 01/14/2014, Until Discontinued, Print    ibuprofen (ADVIL,MOTRIN) 800 MG tablet Take 1 tablet (800 mg total) by mouth 3 (three) times daily with meals., Starting 01/14/2014, Until Discontinued, Print    traMADol (ULTRAM) 50 MG tablet Take 1 tablet (50 mg total) by mouth every 6 (six) hours as needed., Starting 01/14/2014, Until Discontinued, Print        I personally performed the services described in this documentation, which was scribed in my presence. The recorded information has been reviewed and is accurate.    Lorrine Kin, PA-C 01/16/14 0140

## 2014-01-14 NOTE — ED Notes (Signed)
She states last night she bent to pick something up and felt a pop in her lower back and shes had severe lower back pain since. Ambulatory. Denies bowel/bladder changes

## 2014-01-14 NOTE — Discharge Instructions (Signed)
Call for a follow up appointment with a Family or Primary Care Provider.  Return if Symptoms worsen.   Take medication as prescribed.  Ice your back 3-4 times a day.   Emergency Department Resource Guide 1) Find a Doctor and Pay Out of Pocket Although you won't have to find out who is covered by your insurance plan, it is a good idea to ask around and get recommendations. You will then need to call the office and see if the doctor you have chosen will accept you as a new patient and what types of options they offer for patients who are self-pay. Some doctors offer discounts or will set up payment plans for their patients who do not have insurance, but you will need to ask so you aren't surprised when you get to your appointment.  2) Contact Your Local Health Department Not all health departments have doctors that can see patients for sick visits, but many do, so it is worth a call to see if yours does. If you don't know where your local health department is, you can check in your phone book. The CDC also has a tool to help you locate your state's health department, and many state websites also have listings of all of their local health departments.  3) Find a [facility] If your illness is not likely to be very severe or complicated, you may want to try a walk in clinic. These are popping up all over the country in pharmacies, drugstores, and shopping centers. They're usually staffed by nurse practitioners or physician assistants that have been trained to treat common illnesses and complaints. They're usually fairly quick and inexpensive. However, if you have serious medical issues or chronic medical problems, these are probably not your best option.  No Primary Care Doctor: - Call Health Connect at  [phone] - they can help you locate a primary care doctor that  accepts your insurance, provides certain services, etc. - Physician Referral Service- [phone]  Chronic Pain  Problems: Organization         Address  Phone   Notes  [facility]  [phone] Patients need to be referred by their primary care doctor.   Medication Assistance: Organization         Address  Phone   Notes  [facility] Medication [facility] [address] [place], [place] [address] [phone] --Must be a resident of [facility] -- Must have NO insurance coverage whatsoever (no Medicaid/ Medicare, etc.) -- The pt. MUST have a primary care doctor that directs their care regularly and follows them in the community   MedAssist  [phone]   [employer]  [phone]    Agencies that provide inexpensive medical care: Organization         Address  Phone   Notes  [place]  [phone]   [facility]    [phone]   [facility] [place], [place] [address] [phone]   [place] [address] [place] [phone]   Planned Parenthood    [phone]   [facility]    [phone]   [place] and [address],  Phone:  [phone], Fax:  [phone] Hours of Operation:  9 am - 6 pm, M-F.  Also accepts Medicaid/Medicare and self-pay.  [facility]  [place] [employer], [address], [place] Phone: [phone], Fax: [phone]. Hours of Operation:  8:30 am - 5:30 pm, M-F.  Also accepts Medicaid and self-pay.  [facility] [place] [address], [place] Phone: [phone]   [place], [place], [place] [phone], Ext. [phone] Mondays & Thursdays: 7-9 AM.  First 15 patients are seen on a first come, first serve basis.    [place] Providers:  Organization         Address  Phone   Notes  [facility] [address], [place] [phone] Also  accepts self-pay patients.  [facility] [address], [place]  [phone]   [place], [address], [place] [phone]   [facility] [address], [place] [phone]   Lucianne Lei [address], [place]   [phone] Only accepts [place] Access [place] patients after they have their name applied to their card.   Self-Pay (no insurance) in [facility]:  Organization         Address  Phone   Notes  Sickle Cell Patients, [facility] Internal [facility] [place] [phone]   [facility] Urgent Care [place] [phone]   [facility] [place]  [place], [place], [place] [phone]   [facility]/Dr. Osei-Bonsu  [address] [place] or [address], [place] [phone] Phone number for both [place] and [place] locations is the same.  Urgent Medical and [facility] [address], [place] [phone]   [facility] [address] [place] or [address] [phone]   [facility] [address] [place] [phone], phone; [phone], fax Sees patients 1st and 3rd Saturday of every month.  Must not qualify for public or private insurance (i.e. Medicaid, Medicare, [place] Health Choice, Veterans' Benefits)  Household income should be no more than 200% of the poverty level The clinic cannot treat you if you are pregnant or think you are pregnant  Sexually transmitted diseases are not treated at the clinic.    Dental Care: Organization         Address  Phone  Notes  [facility] Department of [facility] [place] [phone] Accepts children up to age 37 who are enrolled in [place] or [place]; pregnant  women with a Medicaid card; and children who have applied for Medicaid or [place] Health Choice, but were declined, whose parents can pay a reduced fee at time of service.  [facility] Department of [facility]  [address], [place] [phone] Accepts children up to age 58 who are enrolled in [place] or [place]; pregnant women with a Medicaid card; and children who have applied for Medicaid or [place] Health Choice, but were declined, whose parents can pay a reduced fee at time of service.  [facility] Adult [facility] Access PROGRAM  [place] [phone] Patients are seen by appointment only. Walk-ins are not accepted. [place] will see patients 22 years of age and older. Monday - Tuesday (8am-5pm) Most Wednesdays (8:30-5pm) $30 per visit, cash only  [place] Adult  Dental Access PROGRAM  [address], [facility] [phone] Patients are seen by appointment only. Walk-ins are not accepted. [place] will see patients 60 years of age and older. One Wednesday Evening (Monthly: Volunteer Based).  $30 per visit, cash only  [place]  [phone] for adults; Children under age 4, call Graduate Pediatric Dentistry at [phone]. Children aged 49-14, please call [phone] to request a pediatric application.  Dental services are provided in all areas of dental care including fillings, crowns and bridges, complete and partial dentures, implants, gum treatment, root canals, and extractions. Preventive care is also provided. Treatment is provided to both adults and children. Patients are selected via a lottery and there is often a waiting list.   [facility] [address] [place]  [phone] [email or web address]   [facility] [address] [place], [place] [phone], Ext. [phone] Second and Fourth Thursday of each month, opens at 6:30 AM; Clinic ends at 9 AM.  Patients are  seen on a first-come first-served basis, and a limited number are seen during each clinic.   [facility]  [address] Hillard Danker [place], [place] [phone]   Eligibility Requirements You must have lived in [place], [place], or [place] counties for at least the last three months.   You cannot be eligible for state or federal sponsored [employer], including [employer], [place], or [employer].   You generally cannot be eligible for healthcare insurance through your employer.    How to apply: Eligibility screenings are held every Tuesday and Wednesday afternoon from 1:00 pm until 4:00 pm. You do not need an appointment for the interview!  [facility] [address] [place], [place]   [place]  [place] Department  [place]  [phone]    Behavioral Health Resources in the Community: Intensive Outpatient Programs Organization         Address  Phone  Notes  [place]ler. [address], [place], [place] [phone]   [facility] Outpatient [address], [place], [place]   ADS: Alcohol & Drug Svcs [address], [place], [place]   [place] [address],  [place], [place] or [phone]   Substance Abuse Resources Organization         Address  Phone  Notes  Alcohol and Drug Services  [phone]   [place]  [phone]   The [place]   Chinita Pester  [phone]   Residential & Outpatient Substance Abuse Program  [phone]   Psychological Services Organization         Address  Phone  Notes  [facility] [place]  [place]  [phone]   [place] [address] [place] [phone] or [phone]    Mobile Crisis  Teams Organization         Address  Phone  Notes  [facility], Mobile Crisis Care Unit  [phone]   Assertive Psychotherapeutic Services  [address] [place], [place]   [place] Levels [address] [place] [phone]    Self-Help/Support Groups Organization         Address  Phone             Notes  Mental  Health Assoc. of [place] - variety of support groups  [place] Call for more information  Narcotics Anonymous (NA), [facility] [address], [employer] [place]  2 meetings at this location   Special educational needs teacher         Address  Phone  Notes  ASAP Residential Treatment [place],    [place]  [phone]   [facility]  [address] [place] [address]38, [place], [place]   [place], [place] [phone] Admissions: 8am-3pm M-F  Incentives Substance [place] [address]    [place], [place] [phone]   [facility] [address] [place], [place], [place]   [facility] [address],  [place], [place]   [facility]ight Programs - Intensive Outpatient [address] Kristeen Mans [address], [place], [place]   [facility] ([address].,  [place], [place] [phone] or [phone]   [facility]idential Treatment Services (RTS) [address], [place], [place] Accepts Medicaid  [facility] [address],  [place] [phone] Substance Abuse/Addiction Treatment   [facility] Organization         Address  Phone  Notes  [facility]  [phone]   Domenic Schwab, PhD [address] Arlis Port[address], [place]   [phone] or [phone]   [place], [place] [phone]   [facility] [address] [place], [place] [phone]  Insurance/Medicaid/sponsorship through [facility] and Families [address], [address] [place]                                    [place], [place] [phone] [place] [address] [place], [place] [phone]    Dr. Adele Schilder  [phone]   [facility] Dept. 1) [address],  2) [place] 3)  [address], [place] [phone]  [phone]   [place] [phone] or [phone] (After Hours)

## 2014-01-21 NOTE — ED Provider Notes (Signed)
Medical screening examination/treatment/procedure(s) were performed by non-physician practitioner and as supervising physician I was immediately available for consultation/collaboration.   EKG Interpretation None       Babette Relic, MD 01/21/14 [phone]

## 2014-02-21 ENCOUNTER — Inpatient Hospital Stay (HOSPITAL_COMMUNITY): Payer: Self-pay

## 2014-02-21 ENCOUNTER — Encounter (HOSPITAL_COMMUNITY): Payer: Self-pay | Admitting: *Deleted

## 2014-02-21 ENCOUNTER — Inpatient Hospital Stay (HOSPITAL_COMMUNITY)
Admission: AD | Admit: 2014-02-21 | Discharge: 2014-02-21 | Disposition: A | Discharge status: Home / Self Care | Payer: Self-pay | Source: Ambulatory Visit | Attending: Obstetrics & Gynecology | Admitting: Obstetrics & Gynecology

## 2014-02-21 DIAGNOSIS — F17200 Nicotine dependence, unspecified, uncomplicated: Secondary | ICD-10-CM | POA: Insufficient documentation

## 2014-02-21 DIAGNOSIS — N949 Unspecified condition associated with female genital organs and menstrual cycle: Secondary | ICD-10-CM | POA: Insufficient documentation

## 2014-02-21 DIAGNOSIS — N925 Other specified irregular menstruation: Secondary | ICD-10-CM

## 2014-02-21 DIAGNOSIS — R109 Unspecified abdominal pain: Secondary | ICD-10-CM | POA: Insufficient documentation

## 2014-02-21 DIAGNOSIS — N938 Other specified abnormal uterine and vaginal bleeding: Secondary | ICD-10-CM | POA: Insufficient documentation

## 2014-02-21 LAB — URINALYSIS, ROUTINE W REFLEX MICROSCOPIC
Bilirubin Urine: NEGATIVE
GLUCOSE, UA: NEGATIVE mg/dL
Ketones, ur: NEGATIVE mg/dL
Leukocytes, UA: NEGATIVE
Nitrite: NEGATIVE
Protein, ur: NEGATIVE mg/dL
Specific Gravity, Urine: 1.02 (ref 1.005–1.030)
UROBILINOGEN UA: 0.2 mg/dL (ref 0.0–1.0)
pH: 6 (ref 5.0–8.0)

## 2014-02-21 LAB — WET PREP, GENITAL
Clue Cells Wet Prep HPF POC: NONE SEEN
Trich, Wet Prep: NONE SEEN
Yeast Wet Prep HPF POC: NONE SEEN

## 2014-02-21 LAB — POCT PREGNANCY, URINE: PREG TEST UR: NEGATIVE

## 2014-02-21 LAB — URINE MICROSCOPIC-ADD ON

## 2014-02-21 MED ORDER — IBUPROFEN 800 MG PO TABS: 800.0000 mg | ORAL_TABLET | Freq: Three times a day (TID) | ORAL | Status: DC | PRN

## 2014-02-21 MED ORDER — IBUPROFEN 800 MG PO TABS
800.0000 mg | ORAL_TABLET | ORAL | Status: AC
  Administered 2014-02-21: 800 mg via ORAL
  Filled 2014-02-21: qty 1

## 2014-02-21 NOTE — MAU Note (Signed)
Discharge instructions reviewed with patient; questions answered. Patient verbalized understanding of instructions.

## 2014-02-21 NOTE — Discharge Instructions (Signed)

## 2014-02-21 NOTE — MAU Provider Note (Signed)
History     CSN: [id number]  Arrival date and time: 02/21/14 1626   None     Chief Complaint  Patient presents with  . Vaginal Bleeding  . Abdominal Cramping   Vaginal Bleeding Associated symptoms include abdominal pain. Pertinent negatives include no chills, constipation, diarrhea, dysuria, fever, frequency, headaches, nausea, rash, sore throat or vomiting.  Abdominal Cramping Pertinent negatives include no constipation, diarrhea, dysuria, fever, frequency, headaches, nausea or vomiting.   Katrina [name] 23 y.o. [id number] presents to MAU with complaint of vaginal bleeding and cramping.  Her period was due end of July but did not come until 2 weeks late: 02/04/14.  It lasted one week and was very heavy with large clots and severe pain.  Her period ended 8/14.  She began to have spotting again on 02/19/14 and then bleeding increased and she has been having heavy bleeding since that time along with pain.  The pain is present on both sides of the lower abdomen but not always both sides simultaneously. She has tried Midol, Tylenol and Ibuprofen with some relief.  The pain always returns after the medication wears off.  She has not used any medication today.   She denies nausea, vomiting, fever.   She takes OrthoTriCyclen and reports she uses this daily with no missed pills.  She started this OCP in December 2014. OB History   Grav Para Term Preterm Abortions TAB SAB Ect Mult Living   2 2 2  0 0 0 0 0 0 2      Past Medical History  Diagnosis Date  . Asthma   . Herpes simplex of female genitalia     last outbreak 4 months ago  . Anxiety   . Depression   . Migraines     Past Surgical History  Procedure Laterality Date  . No past surgeries      History reviewed. No pertinent family history.  History  Substance Use Topics  . Smoking status: Current Every Day Smoker -- 0.25 packs/day    Types: Cigarettes    Last Attempt to Quit: 05/02/2013  . Smokeless tobacco: Not on file  .  Alcohol Use: Yes     Comment: occ    Allergies:  Allergies  Allergen Reactions  . Shellfish Allergy Swelling    Prescriptions prior to admission  Medication Sig Dispense Refill  . albuterol (PROVENTIL HFA;VENTOLIN HFA) 108 (90 BASE) MCG/ACT inhaler Inhale 1-2 puffs into the lungs every 6 (six) hours as needed for wheezing.  1 Inhaler  0  . cyclobenzaprine (FLEXERIL) 10 MG tablet Take 1 tablet (10 mg total) by mouth at bedtime.  5 tablet  0  . ibuprofen (ADVIL,MOTRIN) 800 MG tablet Take 1 tablet (800 mg total) by mouth 3 (three) times daily with meals.  21 tablet  0  . promethazine (PHENERGAN) 25 MG tablet Take 1 tablet (25 mg total) by mouth every 6 (six) hours as needed for nausea.  20 tablet  0  . traMADol (ULTRAM) 50 MG tablet Take 1 tablet (50 mg total) by mouth every 6 (six) hours as needed.  10 tablet  0    Review of Systems  Constitutional: Negative for fever and chills.  HENT: Negative for congestion and sore throat.   Respiratory: Negative for cough and shortness of breath.   Cardiovascular: Negative for chest pain and palpitations.  Gastrointestinal: Positive for abdominal pain. Negative for heartburn, nausea, vomiting, diarrhea and constipation.  Genitourinary: Positive for vaginal bleeding. Negative for dysuria and  frequency.  Skin: Negative for itching and rash.  Neurological: Negative for dizziness, tingling, weakness and headaches.  Psychiatric/Behavioral: Negative for depression and substance abuse.   Physical Exam   Blood pressure 128/76, pulse 65, temperature 99.4 F (37.4 C), temperature source Oral, resp. rate 16, height 5\' 1"  (1.549 m), weight 73.573 kg (162 lb 3.2 oz), last menstrual period 02/04/2014, SpO2 100.00%.  Physical Exam  Constitutional: She is oriented to person, place, and time. She appears well-developed and well-nourished. No distress.  HENT:  Head: Normocephalic and atraumatic.  Eyes: EOM are normal.  Neck: Normal range of motion.   Cardiovascular: Normal rate and regular rhythm.   Respiratory: Effort normal and breath sounds normal. No respiratory distress.  GI: Soft. Bowel sounds are normal. There is tenderness.  Right lower quadrant tender to palpation  Genitourinary:  Large amt of blood in vaginal vault. Positive cervical motion tenderness  Adnexal tenderness bilaterally but not as marked as CMT.  Musculoskeletal: Normal range of motion.  Neurological: She is alert and oriented to person, place, and time.  Skin: Skin is warm and dry.  Psychiatric: She has a normal mood and affect.   Results for orders placed during the hospital encounter of 02/21/14 (from the past 24 hour(s))  URINALYSIS, ROUTINE W REFLEX MICROSCOPIC     Status: Abnormal   Collection Time    02/21/14  5:00 PM      Result Value Ref Range   Color, Urine YELLOW  YELLOW   APPearance CLEAR  CLEAR   Specific Gravity, Urine 1.020  1.005 - 1.030   pH 6.0  5.0 - 8.0   Glucose, UA NEGATIVE  NEGATIVE mg/dL   Hgb urine dipstick MODERATE (*) NEGATIVE   Bilirubin Urine NEGATIVE  NEGATIVE   Ketones, ur NEGATIVE  NEGATIVE mg/dL   Protein, ur NEGATIVE  NEGATIVE mg/dL   Urobilinogen, UA 0.2  0.0 - 1.0 mg/dL   Nitrite NEGATIVE  NEGATIVE   Leukocytes, UA NEGATIVE  NEGATIVE  POCT PREGNANCY, URINE     Status: None   Collection Time    02/21/14  5:00 PM      Result Value Ref Range   Preg Test, Ur NEGATIVE  NEGATIVE  URINE MICROSCOPIC-ADD ON     Status: Abnormal   Collection Time    02/21/14  5:00 PM      Result Value Ref Range   Squamous Epithelial / LPF MANY (*) RARE   WBC, UA 0-2  <3 WBC/hpf   RBC / HPF 3-6  <3 RBC/hpf   Bacteria, UA FEW (*) RARE  WET PREP, GENITAL     Status: Abnormal   Collection Time    02/21/14  5:31 PM      Result Value Ref Range   Yeast Wet Prep HPF POC NONE SEEN  NONE SEEN   Trich, Wet Prep NONE SEEN  NONE SEEN   Clue Cells Wet Prep HPF POC NONE SEEN  NONE SEEN   WBC, Wet Prep HPF POC FEW (*) NONE SEEN   US  Transvaginal Non-ob  02/21/2014   CLINICAL DATA:  Pelvic pain and bleeding.  EXAM: TRANSABDOMINAL AND TRANSVAGINAL ULTRASOUND OF PELVIS  TECHNIQUE: Both transabdominal and transvaginal ultrasound examinations of the pelvis were performed. Transabdominal technique was performed for global imaging of the pelvis including uterus, ovaries, adnexal regions, and pelvic cul-de-sac. It was necessary to proceed with endovaginal exam following the transabdominal exam to visualize the endometrium and ovaries.  COMPARISON:  05/03/2012  FINDINGS: Uterus  Measurements:  3.4 x 5.2 x 6.9 cm. No fibroids or other mass visualized.  Endometrium  Thickness: 3 mm.  No focal abnormality visualized.  Right ovary  Measurements: 2.6 x 3.7 x 4.1 cm. There is a simple ovarian cyst measuring 2.5 x 3.6 x 4.0 cm.  Left ovary  Measurements: 1.2 x 1.9 x 2.5 cm. Normal appearance/no adnexal mass.  Other findings  Vascular flow visualize within both ovaries.  No free pelvic fluid.  IMPRESSION: No acute findings.  4 cm simple right ovarian cyst.   Electronically Signed   By: Marin Olp M.D.   On: 02/21/2014 19:04   US Pelvis Complete  02/21/2014   CLINICAL DATA:  Pelvic pain and bleeding.  EXAM: TRANSABDOMINAL AND TRANSVAGINAL ULTRASOUND OF PELVIS  TECHNIQUE: Both transabdominal and transvaginal ultrasound examinations of the pelvis were performed. Transabdominal technique was performed for global imaging of the pelvis including uterus, ovaries, adnexal regions, and pelvic cul-de-sac. It was necessary to proceed with endovaginal exam following the transabdominal exam to visualize the endometrium and ovaries.  COMPARISON:  05/03/2012  FINDINGS: Uterus  Measurements: 3.4 x 5.2 x 6.9 cm. No fibroids or other mass visualized.  Endometrium  Thickness: 3 mm.  No focal abnormality visualized.  Right ovary  Measurements: 2.6 x 3.7 x 4.1 cm. There is a simple ovarian cyst measuring 2.5 x 3.6 x 4.0 cm.  Left ovary  Measurements: 1.2 x 1.9 x 2.5 cm.  Normal appearance/no adnexal mass.  Other findings  Vascular flow visualize within both ovaries.  No free pelvic fluid.  IMPRESSION: No acute findings.  4 cm simple right ovarian cyst.   Electronically Signed   By: Marin Olp M.D.   On: 02/21/2014 19:04    MAU Course  Procedures Pelvic u/s Ibuprofen 800mg  given in MAU Labs  MDM ?pelvic infection/PID however pt repeatedly declines to have antibiotic medication without positive confirmatory testing  Assessment and Plan  Assessment: dysfunctional uterine bleeding with pelvic pain  Plan: Discharge to home Pt declines to have antibiotic medication to treat possible STD infection.   GC/Chlamydia pending Return to MAU for fever, increased abdominal pain, bleeding soaking >1pad/hour, etc. See GYN for additional evaluation.    Paticia Stack 02/21/2014, 5:15 PM

## 2014-02-21 NOTE — MAU Note (Signed)
Patient states she has been on BCP's for about 3-4 months and had been having regular periods. Had a heavy and painful period and 2 weeks late on 8-8/14. Started bleeding again on 8-23 and is bleeding heavy with a lot of pain. Has been having clotting.

## 2014-02-21 NOTE — MAU Provider Note (Signed)
Attestation of Attending Supervision of Advanced Practitioner (PA/CNM/NP): Evaluation and management procedures were performed by the Advanced Practitioner under my supervision and collaboration.  I have reviewed the Advanced Practitioner's note and chart, and I agree with the management and plan.  Sharrieff Spratlin, MD, FACOG Attending Obstetrician & Gynecologist Faculty Practice, [facility]   

## 2014-02-22 LAB — GC/CHLAMYDIA PROBE AMP
CT Probe RNA: NEGATIVE
GC Probe RNA: NEGATIVE

## 2014-03-20 ENCOUNTER — Encounter (HOSPITAL_COMMUNITY): Payer: Self-pay

## 2014-03-21 ENCOUNTER — Encounter (HOSPITAL_COMMUNITY): Payer: Self-pay

## 2014-03-21 ENCOUNTER — Other Ambulatory Visit: Payer: Self-pay | Admitting: Obstetrics and Gynecology

## 2014-03-21 ENCOUNTER — Ambulatory Visit (HOSPITAL_COMMUNITY)
Admission: RE | Admit: 2014-03-21 | Discharge: 2014-03-21 | Disposition: A | Discharge status: Home / Self Care | Payer: Self-pay | Source: Ambulatory Visit | Attending: Obstetrics and Gynecology | Admitting: Obstetrics and Gynecology

## 2014-03-21 VITALS — BP 102/60 | Temp 99.0°F | Ht 61.0 in | Wt 166.0 lb

## 2014-03-21 DIAGNOSIS — R87810 Cervical high risk human papillomavirus (HPV) DNA test positive: Secondary | ICD-10-CM

## 2014-03-21 DIAGNOSIS — Z1239 Encounter for other screening for malignant neoplasm of breast: Secondary | ICD-10-CM

## 2014-03-21 DIAGNOSIS — N6311 Unspecified lump in the right breast, upper outer quadrant: Secondary | ICD-10-CM | POA: Insufficient documentation

## 2014-03-21 DIAGNOSIS — R87610 Atypical squamous cells of undetermined significance on cytologic smear of cervix (ASC-US): Secondary | ICD-10-CM

## 2014-03-21 DIAGNOSIS — N6310 Unspecified lump in the right breast, unspecified quadrant: Secondary | ICD-10-CM

## 2014-03-21 NOTE — Patient Instructions (Signed)
Explained to Katrina [name] that she did not need a Pap smear today due to last Pap smear was 01/27/2014. Referred patient to the the [place] for colposcopy to follow-up for abnormal Pap smear. Appointment scheduled for Friday, April 07, 2014 at 1030. Referred patient to the [place] for right breast ultrasound. Appointment scheduled for Thursday, March 23, 2014 at 1000. Patient aware of appointments and will be there. Gabrianna Fassnacht verbalized understanding.  [place], Arvil Chaco, RN

## 2014-03-21 NOTE — Progress Notes (Addendum)
Patient referred by [facility] to [facility] due to having an abnormal Pap smear on 01/27/2014 that a colposcopy is recommended for follow-up. Patient complained of right outer breast tenderness that comes and goes. Patient rated pain at a 6 out of 10.  Pap Smear:  Pap smear not completed today. Last Pap smear was 01/27/2014 at the [facility] Department and ASCUS HPV positive. Referred patient to the the [place] for colposcopy to follow-up for abnormal Pap smear. Appointment scheduled for Friday, April 07, 2014 at 1030. Per patient has a history of two previous abnormal Pap smears that a colposcopy was recommended for follow-up. Patient stated she did not have the recommended colposcopies completed. Last Pap smear result is scanned into EPIC under media.  Physical exam: Breasts Breasts symmetrical. No skin abnormalities bilateral breasts. No nipple retraction bilateral breasts. No nipple discharge bilateral breasts. No lymphadenopathy. No lumps palpated left breast. Palpated a moveable pea sized lump within the right breast at 10 o'clock 4 cm from the nipple. Complaints of tenderness when palpated right outer breast and lump. Referred patient to the [facility] for right breast ultrasound. Appointment scheduled for Thursday, March 23, 2014 at 1000.        Pelvic/Bimanual No Pap smear completed today since last Pap smear was 01/27/2014. Pap smear not indicated per BCCCP guidelines.

## 2014-03-21 NOTE — Addendum Note (Signed)
Encounter addended by: Loletta Parish, RN on: 03/21/2014  3:12 PM<BR>     Documentation filed: Charges VN, Patient Instructions Section, ED Follow-Up, Problem List, Visit Diagnoses, Notes Section

## 2014-03-23 ENCOUNTER — Ambulatory Visit: Admission: RE | Admit: 2014-03-23 | Payer: No Typology Code available for payment source | Source: Ambulatory Visit

## 2014-03-23 ENCOUNTER — Ambulatory Visit
Admission: RE | Admit: 2014-03-23 | Discharge: 2014-03-23 | Disposition: A | Discharge status: Home / Self Care | Payer: No Typology Code available for payment source | Source: Ambulatory Visit | Attending: Obstetrics and Gynecology | Admitting: Obstetrics and Gynecology

## 2014-03-23 DIAGNOSIS — N6310 Unspecified lump in the right breast, unspecified quadrant: Secondary | ICD-10-CM

## 2014-03-30 HISTORY — PX: COLPOSCOPY: SHX161

## 2014-04-07 ENCOUNTER — Ambulatory Visit (INDEPENDENT_AMBULATORY_CARE_PROVIDER_SITE_OTHER): Payer: Self-pay | Admitting: Obstetrics & Gynecology

## 2014-04-07 ENCOUNTER — Encounter: Payer: Self-pay | Admitting: Obstetrics & Gynecology

## 2014-04-07 ENCOUNTER — Other Ambulatory Visit (HOSPITAL_COMMUNITY)
Admission: RE | Admit: 2014-04-07 | Discharge: 2014-04-07 | Disposition: A | Discharge status: Home / Self Care | Payer: Self-pay | Source: Ambulatory Visit | Attending: Obstetrics & Gynecology | Admitting: Obstetrics & Gynecology

## 2014-04-07 VITALS — BP 128/79 | HR 88 | Temp 98.3°F | Ht 61.0 in | Wt 165.2 lb

## 2014-04-07 DIAGNOSIS — R87810 Cervical high risk human papillomavirus (HPV) DNA test positive: Secondary | ICD-10-CM

## 2014-04-07 DIAGNOSIS — N871 Moderate cervical dysplasia: Secondary | ICD-10-CM | POA: Insufficient documentation

## 2014-04-07 DIAGNOSIS — R87610 Atypical squamous cells of undetermined significance on cytologic smear of cervix (ASC-US): Secondary | ICD-10-CM

## 2014-04-07 DIAGNOSIS — Z01812 Encounter for preprocedural laboratory examination: Secondary | ICD-10-CM

## 2014-04-07 LAB — POCT PREGNANCY, URINE: Preg Test, Ur: NEGATIVE

## 2014-04-07 NOTE — Patient Instructions (Signed)

## 2014-04-07 NOTE — Progress Notes (Signed)
Patient ID: Katrina [name], female   DOB: 1991-05-04, 23 y.o.   MRN: [id number]  Chief Complaint  Patient presents with  . Colposcopy    HPI Katrina [name] is a 23 y.o. female.  [id number] Patient's last menstrual period was 03/31/2014. On OCP. Pap BCCCP 12/2013 ASCUS  HPI  Indications: Pap smear on July 2015 showed: ASCUS with POSITIVE high risk HPV. Previous colposcopy: no. Prior cervical treatment: no.  Past Medical History  Diagnosis Date  . Asthma   . Herpes simplex of female genitalia     last outbreak 4 months ago  . Migraines     Past Surgical History  Procedure Laterality Date  . No past surgeries      No family history on file.  Social History History  Substance Use Topics  . Smoking status: Current Some Day Smoker -- 0.25 packs/day    Types: Cigarettes    Last Attempt to Quit: 05/02/2013  . Smokeless tobacco: Not on file  . Alcohol Use: Yes     Comment: socially    Allergies  Allergen Reactions  . Shellfish Allergy Swelling    Current Outpatient Prescriptions  Medication Sig Dispense Refill  . albuterol (PROVENTIL HFA;VENTOLIN HFA) 108 (90 BASE) MCG/ACT inhaler Inhale 1-2 puffs into the lungs every 6 (six) hours as needed for wheezing.  1 Inhaler  0   No current facility-administered medications for this visit.    Review of Systems Review of Systems  Blood pressure 128/79, pulse 88, temperature 98.3 F (36.8 C), temperature source Oral, height 5\' 1"  (1.549 m), weight 165 lb 3.2 oz (74.934 kg), last menstrual period 03/31/2014.  Physical Exam Physical Exam  Data Reviewed Pap result  Assessment    Procedure Details  The risks and benefits of the procedure and Written informed consent obtained.  Speculum placed in vagina and excellent visualization of cervix achieved, cervix swabbed x 3 with acetic acid solution. SCJ seen TZ with AWE ant and post, gland openings, no lesion endocervix, suspect LSIL Specimens: 7 and 11 Bx and  ECC  Complications: none.     Plan    Specimens labelled and sent to Pathology. Return to discuss Pathology results in 2 weeks.      Yeshua Stryker 04/07/2014, 11:26 AM

## 2014-04-19 ENCOUNTER — Encounter: Payer: Self-pay | Admitting: *Deleted

## 2014-04-23 ENCOUNTER — Emergency Department (HOSPITAL_COMMUNITY)
Admission: EM | Admit: 2014-04-23 | Discharge: 2014-04-23 | Disposition: A | Discharge status: Home / Self Care | Payer: No Typology Code available for payment source | Attending: Emergency Medicine | Admitting: Emergency Medicine

## 2014-04-23 ENCOUNTER — Emergency Department (HOSPITAL_COMMUNITY): Payer: No Typology Code available for payment source

## 2014-04-23 ENCOUNTER — Encounter (HOSPITAL_COMMUNITY): Payer: Self-pay | Admitting: Emergency Medicine

## 2014-04-23 DIAGNOSIS — S3992XA Unspecified injury of lower back, initial encounter: Secondary | ICD-10-CM | POA: Diagnosis not present

## 2014-04-23 DIAGNOSIS — J45909 Unspecified asthma, uncomplicated: Secondary | ICD-10-CM | POA: Diagnosis not present

## 2014-04-23 DIAGNOSIS — S199XXA Unspecified injury of neck, initial encounter: Secondary | ICD-10-CM | POA: Diagnosis not present

## 2014-04-23 DIAGNOSIS — Z8742 Personal history of other diseases of the female genital tract: Secondary | ICD-10-CM | POA: Diagnosis not present

## 2014-04-23 DIAGNOSIS — S4991XA Unspecified injury of right shoulder and upper arm, initial encounter: Secondary | ICD-10-CM | POA: Diagnosis not present

## 2014-04-23 DIAGNOSIS — Z79899 Other long term (current) drug therapy: Secondary | ICD-10-CM | POA: Diagnosis not present

## 2014-04-23 DIAGNOSIS — S0990XA Unspecified injury of head, initial encounter: Secondary | ICD-10-CM | POA: Insufficient documentation

## 2014-04-23 DIAGNOSIS — Y92410 Unspecified street and highway as the place of occurrence of the external cause: Secondary | ICD-10-CM | POA: Insufficient documentation

## 2014-04-23 DIAGNOSIS — Y9389 Activity, other specified: Secondary | ICD-10-CM | POA: Insufficient documentation

## 2014-04-23 DIAGNOSIS — Z720 Tobacco use: Secondary | ICD-10-CM | POA: Diagnosis not present

## 2014-04-23 DIAGNOSIS — Z8679 Personal history of other diseases of the circulatory system: Secondary | ICD-10-CM | POA: Diagnosis not present

## 2014-04-23 MED ORDER — IBUPROFEN 800 MG PO TABS: 800.0000 mg | ORAL_TABLET | Freq: Three times a day (TID) | ORAL | Status: DC

## 2014-04-23 MED ORDER — TRAMADOL HCL 50 MG PO TABS: 50.0000 mg | ORAL_TABLET | Freq: Four times a day (QID) | ORAL | Status: DC | PRN

## 2014-04-23 MED ORDER — KETOROLAC TROMETHAMINE 30 MG/ML IJ SOLN
30.0000 mg | Freq: Once | INTRAMUSCULAR | Status: AC
  Administered 2014-04-23: 30 mg via INTRAMUSCULAR
  Filled 2014-04-23: qty 1
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
## 2014-04-23 NOTE — ED Notes (Signed)
Patient states her shoulder pain is better however she still feels it pulling.
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
## 2014-04-24 ENCOUNTER — Telehealth: Payer: Self-pay

## 2014-04-24 NOTE — Telephone Encounter (Signed)
Patient called requesting colpo results. Called patient and informed her of results and stated that they will be discussed in detail at F/u appointment 04/26/14. Patient verbalized understanding and gratitude. No further questions or concerns.

## 2014-04-26 ENCOUNTER — Other Ambulatory Visit (HOSPITAL_COMMUNITY)
Admission: RE | Admit: 2014-04-26 | Discharge: 2014-04-26 | Disposition: A | Discharge status: Home / Self Care | Payer: Self-pay | Source: Ambulatory Visit | Attending: Obstetrics and Gynecology | Admitting: Obstetrics and Gynecology

## 2014-04-26 ENCOUNTER — Ambulatory Visit (INDEPENDENT_AMBULATORY_CARE_PROVIDER_SITE_OTHER): Payer: Self-pay | Admitting: Obstetrics and Gynecology

## 2014-04-26 ENCOUNTER — Encounter: Payer: Self-pay | Admitting: Obstetrics and Gynecology

## 2014-04-26 VITALS — BP 130/75 | HR 61 | Resp 20 | Ht 61.0 in | Wt 168.8 lb

## 2014-04-26 DIAGNOSIS — Z01812 Encounter for preprocedural laboratory examination: Secondary | ICD-10-CM

## 2014-04-26 DIAGNOSIS — N871 Moderate cervical dysplasia: Secondary | ICD-10-CM

## 2014-04-26 DIAGNOSIS — R87810 Cervical high risk human papillomavirus (HPV) DNA test positive: Secondary | ICD-10-CM

## 2014-04-26 DIAGNOSIS — R87610 Atypical squamous cells of undetermined significance on cytologic smear of cervix (ASC-US): Secondary | ICD-10-CM

## 2014-04-26 LAB — POCT PREGNANCY, URINE: PREG TEST UR: NEGATIVE

## 2014-04-26 NOTE — Progress Notes (Signed)
Patient ID: Katrina [name], female   DOB: 1991/06/13, 23 y.o.   MRN: [id number] 23 yo G2P2 presenting today to discuss colposcopy results. 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 Reviewed treatment options with the patient of excisional biopsy vs ablation of T-zone. Patient opted for excisional biopsy.  Patient identified, informed consent obtained, signed copy in chart, time out performed.  Pap smear and colposcopy reviewed.   Pap ASCUS +HPV Colpo Biopsy CIN-2 ECC benign Teflon coated speculum with smoke evacuator placed.  Cervix visualized. Paracervical block placed.  medium size LOOP used to remove cone of cervix using blend of cut and cautery on LEEP machine.  Edges/Base cauterized with Ball.  Monsel's solution used for hemostasis.  Patient tolerated procedure well.  Patient given post procedure instructions.  Follow up in 4 months for repeat pap or as needed.

## 2014-05-01 ENCOUNTER — Telehealth: Payer: Self-pay

## 2014-05-01 ENCOUNTER — Encounter: Payer: Self-pay | Admitting: Obstetrics and Gynecology

## 2014-05-01 NOTE — Telephone Encounter (Signed)
Attempted to contact patient. No answer. No voicemail box set up--unable to leave message.

## 2014-05-01 NOTE — Telephone Encounter (Signed)
-----   Message from Mora Bellman, MD sent at 05/01/2014  8:08 AM EST ----- Please inform patient of LEEP results consistent with the removal of all abnormal cells. Patient needs to follow up with repeat pap smear in 6 months  Thanks  [employer]

## 2014-05-02 ENCOUNTER — Telehealth: Payer: Self-pay | Admitting: *Deleted

## 2014-05-02 NOTE — Telephone Encounter (Signed)
Patient called back and stated that she is having some bleeding. States that bleeding is light like a period. Advised patient that this could be her period, however if bleeding becomes worse or heavier than a period that she can come to [facility] for evaluation. Patient is agreeable to this.

## 2014-05-02 NOTE — Telephone Encounter (Signed)
Patient returned call, informed her of results and need for followup in 6 mo. Patient had no further questions.

## 2014-05-21 ENCOUNTER — Inpatient Hospital Stay (HOSPITAL_COMMUNITY)
Admission: AD | Admit: 2014-05-21 | Discharge: 2014-05-22 | Disposition: A | Discharge status: Home / Self Care | Payer: Medicaid Other | Source: Ambulatory Visit | Attending: Obstetrics & Gynecology | Admitting: Obstetrics & Gynecology

## 2014-05-21 ENCOUNTER — Inpatient Hospital Stay (HOSPITAL_COMMUNITY): Payer: Medicaid Other

## 2014-05-21 ENCOUNTER — Encounter (HOSPITAL_COMMUNITY): Payer: Self-pay | Admitting: *Deleted

## 2014-05-21 DIAGNOSIS — O209 Hemorrhage in early pregnancy, unspecified: Secondary | ICD-10-CM | POA: Diagnosis not present

## 2014-05-21 DIAGNOSIS — O360910 Maternal care for other rhesus isoimmunization, first trimester, not applicable or unspecified: Secondary | ICD-10-CM | POA: Insufficient documentation

## 2014-05-21 DIAGNOSIS — Z3A00 Weeks of gestation of pregnancy not specified: Secondary | ICD-10-CM | POA: Insufficient documentation

## 2014-05-21 LAB — URINALYSIS, ROUTINE W REFLEX MICROSCOPIC
BILIRUBIN URINE: NEGATIVE
GLUCOSE, UA: NEGATIVE mg/dL
Ketones, ur: NEGATIVE mg/dL
Leukocytes, UA: NEGATIVE
Nitrite: NEGATIVE
PH: 7 (ref 5.0–8.0)
Protein, ur: NEGATIVE mg/dL
SPECIFIC GRAVITY, URINE: 1.02 (ref 1.005–1.030)
Urobilinogen, UA: 1 mg/dL (ref 0.0–1.0)

## 2014-05-21 LAB — CBC
HCT: 35.7 % — ABNORMAL LOW (ref 36.0–46.0)
Hemoglobin: 12.7 g/dL (ref 12.0–15.0)
MCH: 31.4 pg (ref 26.0–34.0)
MCHC: 35.6 g/dL (ref 30.0–36.0)
MCV: 88.1 fL (ref 78.0–100.0)
PLATELETS: 298 10*3/uL (ref 150–400)
RBC: 4.05 MIL/uL (ref 3.87–5.11)
RDW: 13 % (ref 11.5–15.5)
WBC: 6.9 10*3/uL (ref 4.0–10.5)

## 2014-05-21 LAB — URINE MICROSCOPIC-ADD ON

## 2014-05-21 LAB — POCT PREGNANCY, URINE: Preg Test, Ur: POSITIVE — AB

## 2014-05-21 LAB — HCG, QUANTITATIVE, PREGNANCY: HCG, BETA CHAIN, QUANT, S: 444 m[IU]/mL — AB (ref <5)

## 2014-05-21 NOTE — [facility] Note (Signed)
Pt reports she had a positive preg test this am. States she had had blood on the tissue when she wipes this pm. LMP unknown

## 2014-05-22 DIAGNOSIS — O209 Hemorrhage in early pregnancy, unspecified: Secondary | ICD-10-CM

## 2014-05-22 LAB — HIV ANTIBODY (ROUTINE TESTING W REFLEX): HIV 1&2 Ab, 4th Generation: NONREACTIVE

## 2014-05-22 LAB — WET PREP, GENITAL
Clue Cells Wet Prep HPF POC: NONE SEEN
TRICH WET PREP: NONE SEEN
Yeast Wet Prep HPF POC: NONE SEEN

## 2014-05-22 MED ORDER — RHO D IMMUNE GLOBULIN 1500 UNIT/2ML IJ SOSY
300.0000 ug | PREFILLED_SYRINGE | Freq: Once | INTRAMUSCULAR | Status: AC
  Administered 2014-05-22: 300 ug via INTRAMUSCULAR
  Filled 2014-05-22: qty 2

## 2014-05-22 NOTE — Discharge Instructions (Signed)
Vaginal Bleeding During Pregnancy, First Trimester  A small amount of bleeding (spotting) from the vagina is relatively common in early pregnancy. It usually stops on its own. Various things may cause bleeding or spotting in early pregnancy. Some bleeding may be related to the pregnancy, and some may not. In most cases, the bleeding is normal and is not a problem. However, bleeding can also be a sign of something serious. Be sure to tell your health care provider about any vaginal bleeding right away.  Some possible causes of vaginal bleeding during the first trimester include:  · Infection or inflammation of the cervix.  · Growths (polyps) on the cervix.  · Miscarriage or threatened miscarriage.  · Pregnancy tissue has developed outside of the uterus and in a fallopian tube (tubal pregnancy).  · Tiny cysts have developed in the uterus instead of pregnancy tissue (molar pregnancy).  HOME CARE INSTRUCTIONS   Watch your condition for any changes. The following actions may help to lessen any discomfort you are feeling:  · Follow your health care provider's instructions for limiting your activity. If your health care provider orders bed rest, you may need to stay in bed and only get up to use the bathroom. However, your health care provider may allow you to continue light activity.  · If needed, make plans for someone to help with your regular activities and responsibilities while you are on bed rest.  · Keep track of the number of pads you use each day, how often you change pads, and how soaked (saturated) they are. Write this down.  · Do not use tampons. Do not douche.  · Do not have sexual intercourse or orgasms until approved by your health care provider.  · If you pass any tissue from your vagina, save the tissue so you can show it to your health care provider.  · Only take over-the-counter or prescription medicines as directed by your health care provider.  · Do not take aspirin because it can make you  bleed.  · Keep all follow-up appointments as directed by your health care provider.  SEEK MEDICAL CARE IF:  · You have any vaginal bleeding during any part of your pregnancy.  · You have cramps or labor pains.  · You have a fever, not controlled by medicine.  SEEK IMMEDIATE MEDICAL CARE IF:   · You have severe cramps in your back or belly (abdomen).  · You pass large clots or tissue from your vagina.  · Your bleeding increases.  · You feel light-headed or weak, or you have fainting episodes.  · You have chills.  · You are leaking fluid or have a gush of fluid from your vagina.  · You pass out while having a bowel movement.  MAKE SURE YOU:  · Understand these instructions.  · Will watch your condition.  · Will get help right away if you are not doing well or get worse.  Document Released: 03/26/2005 Document Revised: 06/21/2013 Document Reviewed: 02/21/2013  [employer]® Patient Information ©2015 [employer]. This information is not intended to replace advice given to you by your health care provider. Make sure you discuss any questions you have with your health care provider.

## 2014-05-22 NOTE — MAU Provider Note (Signed)
History     CSN: [id number]  Arrival date and time: 05/21/14 2104   First Provider Initiated Contact with Patient 05/22/14 0041      Chief Complaint  Patient presents with  . Vaginal Bleeding   HPI  HPI: Katrina [name] is a 26 [id number] at [redacted]w[redacted]d by uncertain LMP who presents with positive preg test this am. States she had had blood on the tissue when she wiped this pm.   Past Medical History  Diagnosis Date  . Asthma   . Herpes simplex of female genitalia     last outbreak 4 months ago  . Migraines     Past Surgical History  Procedure Laterality Date  . Colposcopy  03/2014    Family History  Problem Relation Age of Onset  . Asthma Mother   . Asthma Sister   . Asthma Sister     History  Substance Use Topics  . Smoking status: Current Some Day Smoker -- 0.25 packs/day for 2 years    Types: Cigarettes    Last Attempt to Quit: 05/02/2013  . Smokeless tobacco: Not on file  . Alcohol Use: Yes     Comment: socially    Allergies:  Allergies  Allergen Reactions  . Shellfish Allergy Swelling    Prescriptions prior to admission  Medication Sig Dispense Refill Last Dose  . albuterol (PROVENTIL HFA;VENTOLIN HFA) 108 (90 BASE) MCG/ACT inhaler Inhale 2 puffs into the lungs every 6 (six) hours as needed for wheezing or shortness of breath.   05/20/2014 at Unknown time    Review of Systems  Gastrointestinal: Negative for nausea, vomiting and abdominal pain.  Genitourinary:       Pink spotting of blood  All other systems reviewed and are negative.  Physical Exam   Blood pressure 118/70, pulse 78, temperature 98.5 F (36.9 C), temperature source Oral, resp. rate 16, height 5\' 1"  (1.549 m), weight 73.029 kg (161 lb), last menstrual period 04/03/2014, SpO2 100 %.  Physical Exam  Constitutional: She is oriented to person, place, and time. She appears well-developed and well-nourished. No distress.  HENT:  Head: Normocephalic.  Neck: Normal range of motion. Neck  supple.  Cardiovascular: Normal rate, regular rhythm and normal heart sounds.   Respiratory: Effort normal and breath sounds normal. No respiratory distress.  GI: Soft. There is no tenderness.  Genitourinary: Uterus is not enlarged. Right adnexum displays no mass, no tenderness and no fullness. Left adnexum displays no mass, no tenderness and no fullness. No bleeding in the vagina.  Musculoskeletal: Normal range of motion.  Neurological: She is alert and oriented to person, place, and time.  Skin: Skin is warm and dry.    MAU Course  Procedures Results for orders placed or performed during the hospital encounter of 05/21/14 (from the past 24 hour(s))  Urinalysis, Routine w reflex microscopic     Status: Abnormal   Collection Time: 05/21/14  9:09 PM  Result Value Ref Range   Color, Urine YELLOW YELLOW   APPearance CLEAR CLEAR   Specific Gravity, Urine 1.020 1.005 - 1.030   pH 7.0 5.0 - 8.0   Glucose, UA NEGATIVE NEGATIVE mg/dL   Hgb urine dipstick MODERATE (A) NEGATIVE   Bilirubin Urine NEGATIVE NEGATIVE   Ketones, ur NEGATIVE NEGATIVE mg/dL   Protein, ur NEGATIVE NEGATIVE mg/dL   Urobilinogen, UA 1.0 0.0 - 1.0 mg/dL   Nitrite NEGATIVE NEGATIVE   Leukocytes, UA NEGATIVE NEGATIVE  Urine microscopic-add on     Status: None  Collection Time: 05/21/14  9:09 PM  Result Value Ref Range   Squamous Epithelial / LPF RARE RARE   WBC, UA 0-2 <3 WBC/hpf   RBC / HPF 3-6 <3 RBC/hpf   Bacteria, UA RARE RARE  Pregnancy, urine POC     Status: Abnormal   Collection Time: 05/21/14  9:21 PM  Result Value Ref Range   Preg Test, Ur POSITIVE (A) NEGATIVE  hCG, quantitative, pregnancy     Status: Abnormal   Collection Time: 05/21/14 10:10 PM  Result Value Ref Range   hCG, Beta Chain, Quant, S 444 (H) <5 mIU/mL  CBC     Status: Abnormal   Collection Time: 05/21/14 10:10 PM  Result Value Ref Range   WBC 6.9 4.0 - 10.5 K/uL   RBC 4.05 3.87 - 5.11 MIL/uL   Hemoglobin 12.7 12.0 - 15.0 g/dL   HCT  35.7 (L) 36.0 - 46.0 %   MCV 88.1 78.0 - 100.0 fL   MCH 31.4 26.0 - 34.0 pg   MCHC 35.6 30.0 - 36.0 g/dL   RDW 13.0 11.5 - 15.5 %   Platelets 298 150 - 400 K/uL  Rh IG workup (includes ABO/Rh)     Status: None (Preliminary result)   Collection Time: 05/21/14 10:10 PM  Result Value Ref Range   Gestational Age(Wks) 6    ABO/RH(D) O NEG    Antibody Screen NEG    Unit Number [id number]    Blood Component Type RHIG    Unit division 00    Status of Unit ALLOCATED    Transfusion Status OK TO TRANSFUSE    Ultrasound: IMPRESSION: 1. No intrauterine gestational sac, yolk sac, or fetal pole identified. Differential considerations include intrauterine pregnancy too early to be sonographically visualized, missed abortion, or ectopic pregnancy. Followup ultrasound is recommended in 10-14 days for further evaluation. 2. Small amount of free fluid noted within the pelvis.  Assessment and Plan  23 yo G3P2002 at unknown gestation Vaginal bleeding during pregnancy RH negative  Plan: Discharge to home Return in 48 hours for [facility] Reviewed ectopic precautions Rhogam in MAU  [facility] 05/22/2014, 12:49 AM

## 2014-05-23 ENCOUNTER — Inpatient Hospital Stay (HOSPITAL_COMMUNITY)
Admission: AD | Admit: 2014-05-23 | Discharge: 2014-05-23 | Disposition: A | Discharge status: Home / Self Care | Payer: Medicaid Other | Source: Ambulatory Visit | Attending: Obstetrics and Gynecology | Admitting: Obstetrics and Gynecology

## 2014-05-23 DIAGNOSIS — R109 Unspecified abdominal pain: Secondary | ICD-10-CM | POA: Diagnosis not present

## 2014-05-23 DIAGNOSIS — O26899 Other specified pregnancy related conditions, unspecified trimester: Secondary | ICD-10-CM

## 2014-05-23 DIAGNOSIS — O99331 Smoking (tobacco) complicating pregnancy, first trimester: Secondary | ICD-10-CM | POA: Insufficient documentation

## 2014-05-23 DIAGNOSIS — O9989 Other specified diseases and conditions complicating pregnancy, childbirth and the puerperium: Secondary | ICD-10-CM

## 2014-05-23 DIAGNOSIS — O26891 Other specified pregnancy related conditions, first trimester: Secondary | ICD-10-CM | POA: Insufficient documentation

## 2014-05-23 DIAGNOSIS — Z3A01 Less than 8 weeks gestation of pregnancy: Secondary | ICD-10-CM | POA: Diagnosis not present

## 2014-05-23 LAB — URINALYSIS, ROUTINE W REFLEX MICROSCOPIC
BILIRUBIN URINE: NEGATIVE
Glucose, UA: NEGATIVE mg/dL
KETONES UR: NEGATIVE mg/dL
Leukocytes, UA: NEGATIVE
NITRITE: NEGATIVE
PROTEIN: NEGATIVE mg/dL
Specific Gravity, Urine: 1.025 (ref 1.005–1.030)
Urobilinogen, UA: 1 mg/dL (ref 0.0–1.0)
pH: 7 (ref 5.0–8.0)

## 2014-05-23 LAB — RH IG WORKUP (INCLUDES ABO/RH)
ABO/RH(D): O NEG
Antibody Screen: NEGATIVE
Gestational Age(Wks): 6
Unit division: 0

## 2014-05-23 LAB — URINE MICROSCOPIC-ADD ON

## 2014-05-23 LAB — GC/CHLAMYDIA PROBE AMP
CT PROBE, AMP APTIMA: NEGATIVE
GC PROBE AMP APTIMA: NEGATIVE

## 2014-05-23 LAB — HCG, QUANTITATIVE, PREGNANCY: hCG, Beta Chain, Quant, S: 899 m[IU]/mL — ABNORMAL HIGH (ref <5)

## 2014-05-23 MED ORDER — PROMETHAZINE HCL 25 MG PO TABS: 25.0000 mg | ORAL_TABLET | Freq: Four times a day (QID) | ORAL | Status: DC | PRN

## 2014-05-23 NOTE — MAU Provider Note (Signed)
History     CSN: [id number]  Arrival date and time: 05/23/14 1737   None     Chief Complaint  Patient presents with  . Follow-up   HPI This is a 23 y.o. female at [redacted]w[redacted]d by LMP who presents for followup labwork.  Has no complaints other than some nausea. States Phenergan works best.  Therapist, sports Note: Patient to MAU for repeat BHCG. Patient states she is having cramping, nausea and vomiting for 4 days. Denies bleeding.           OB History    Gravida Para Term Preterm AB TAB SAB Ectopic Multiple Living   3 2 2  0 0 0 0 0 0 2      Past Medical History  Diagnosis Date  . Asthma   . Herpes simplex of female genitalia     last outbreak 4 months ago  . Migraines     Past Surgical History  Procedure Laterality Date  . Colposcopy  03/2014    Family History  Problem Relation Age of Onset  . Asthma Mother   . Asthma Sister   . Asthma Sister     History  Substance Use Topics  . Smoking status: Current Some Day Smoker -- 0.25 packs/day for 2 years    Types: Cigarettes    Last Attempt to Quit: 05/02/2013  . Smokeless tobacco: Not on file  . Alcohol Use: Yes     Comment: socially    Allergies:  Allergies  Allergen Reactions  . Shellfish Allergy Swelling    Prescriptions prior to admission  Medication Sig Dispense Refill Last Dose  . albuterol (PROVENTIL HFA;VENTOLIN HFA) 108 (90 BASE) MCG/ACT inhaler Inhale 2 puffs into the lungs every 6 (six) hours as needed for wheezing or shortness of breath.   05/20/2014 at Unknown time    Review of Systems  Constitutional: Negative for fever and malaise/fatigue.  Gastrointestinal: Positive for nausea and abdominal pain (reported to RN but not to me). Negative for vomiting.  Genitourinary: Negative for dysuria.   Physical Exam   Blood pressure 129/59, pulse 70, temperature 99 F (37.2 C), temperature source Oral, resp. rate 16, weight 163 lb (73.936 kg), last menstrual period 04/03/2014, SpO2 100 %.  Physical Exam   Constitutional: She is oriented to person, place, and time. She appears well-developed and well-nourished. No distress.  HENT:  Head: Normocephalic.  Cardiovascular: Normal rate.   Respiratory: Effort normal.  Genitourinary:  Exam not indicated  Musculoskeletal: Normal range of motion.  Neurological: She is alert and oriented to person, place, and time.  Skin: Skin is warm and dry.  Psychiatric: She has a normal mood and affect.    MAU Course  Procedures  MDM Results for orders placed or performed during the hospital encounter of 05/23/14 (from the past 24 hour(s))  Urinalysis, Routine w reflex microscopic     Status: Abnormal   Collection Time: 05/23/14  5:55 PM  Result Value Ref Range   Color, Urine YELLOW YELLOW   APPearance CLEAR CLEAR   Specific Gravity, Urine 1.025 1.005 - 1.030   pH 7.0 5.0 - 8.0   Glucose, UA NEGATIVE NEGATIVE mg/dL   Hgb urine dipstick SMALL (A) NEGATIVE   Bilirubin Urine NEGATIVE NEGATIVE   Ketones, ur NEGATIVE NEGATIVE mg/dL   Protein, ur NEGATIVE NEGATIVE mg/dL   Urobilinogen, UA 1.0 0.0 - 1.0 mg/dL   Nitrite NEGATIVE NEGATIVE   Leukocytes, UA NEGATIVE NEGATIVE  hCG, quantitative, pregnancy     Status: Abnormal  Collection Time: 05/23/14  5:55 PM  Result Value Ref Range   hCG, Beta Chain, Quant, S 899 (H) <5 mIU/mL  Urine microscopic-add on     Status: Abnormal   Collection Time: 05/23/14  5:55 PM  Result Value Ref Range   Squamous Epithelial / LPF FEW (A) RARE   WBC, UA 0-2 <3 WBC/hpf   RBC / HPF 0-2 <3 RBC/hpf   Results for Katrina, [name] (MRN [id number]) as of 05/23/2014 18:54  Ref. Range 05/21/2014 22:10  hCG, Beta Chain, Quant, S Latest Range: <5 mIU/mL 444 (H)    Assessment and Plan  A: Pregnancy at [redacted]w[redacted]d      Cramping     Cannot rule out ectopic yet  P:  Discussed appropriate rise in HCG      Plan [place] in about a week      Ectopic precautions  [facility] 05/23/2014, 6:27 PM

## 2014-05-23 NOTE — MAU Note (Signed)
Patient to MAU for repeat BHCG. Patient states she is having cramping, nausea and vomiting for 4 days. Denies bleeding.

## 2014-05-23 NOTE — Discharge Instructions (Signed)

## 2014-05-26 ENCOUNTER — Encounter (HOSPITAL_COMMUNITY): Payer: Self-pay | Admitting: *Deleted

## 2014-05-26 ENCOUNTER — Inpatient Hospital Stay (HOSPITAL_COMMUNITY): Payer: Medicaid Other

## 2014-05-26 ENCOUNTER — Inpatient Hospital Stay (HOSPITAL_COMMUNITY)
Admission: AD | Admit: 2014-05-26 | Discharge: 2014-05-26 | Disposition: A | Discharge status: Home / Self Care | Payer: Medicaid Other | Source: Ambulatory Visit | Attending: Obstetrics & Gynecology | Admitting: Obstetrics & Gynecology

## 2014-05-26 DIAGNOSIS — O26891 Other specified pregnancy related conditions, first trimester: Secondary | ICD-10-CM | POA: Diagnosis not present

## 2014-05-26 DIAGNOSIS — Z3490 Encounter for supervision of normal pregnancy, unspecified, unspecified trimester: Secondary | ICD-10-CM

## 2014-05-26 DIAGNOSIS — F17200 Nicotine dependence, unspecified, uncomplicated: Secondary | ICD-10-CM

## 2014-05-26 DIAGNOSIS — R109 Unspecified abdominal pain: Secondary | ICD-10-CM | POA: Insufficient documentation

## 2014-05-26 DIAGNOSIS — O09529 Supervision of elderly multigravida, unspecified trimester: Secondary | ICD-10-CM

## 2014-05-26 DIAGNOSIS — Z3A01 Less than 8 weeks gestation of pregnancy: Secondary | ICD-10-CM | POA: Diagnosis not present

## 2014-05-26 DIAGNOSIS — Z87891 Personal history of nicotine dependence: Secondary | ICD-10-CM | POA: Diagnosis not present

## 2014-05-26 LAB — URINALYSIS, ROUTINE W REFLEX MICROSCOPIC
Bilirubin Urine: NEGATIVE
GLUCOSE, UA: NEGATIVE mg/dL
Ketones, ur: NEGATIVE mg/dL
Leukocytes, UA: NEGATIVE
Nitrite: NEGATIVE
PH: 5.5 (ref 5.0–8.0)
Protein, ur: NEGATIVE mg/dL
Urobilinogen, UA: 0.2 mg/dL (ref 0.0–1.0)

## 2014-05-26 LAB — PROGESTERONE: Progesterone: 18.9 ng/mL

## 2014-05-26 LAB — CBC
HCT: 36.8 % (ref 36.0–46.0)
Hemoglobin: 12.9 g/dL (ref 12.0–15.0)
MCH: 31.4 pg (ref 26.0–34.0)
MCHC: 35.1 g/dL (ref 30.0–36.0)
MCV: 89.5 fL (ref 78.0–100.0)
PLATELETS: 250 10*3/uL (ref 150–400)
RBC: 4.11 MIL/uL (ref 3.87–5.11)
RDW: 13.2 % (ref 11.5–15.5)
WBC: 6.2 10*3/uL (ref 4.0–10.5)

## 2014-05-26 LAB — URINE MICROSCOPIC-ADD ON

## 2014-05-26 LAB — HCG, QUANTITATIVE, PREGNANCY: HCG, BETA CHAIN, QUANT, S: 2258 m[IU]/mL — AB (ref <5)

## 2014-05-26 MED ORDER — OXYCODONE-ACETAMINOPHEN 5-325 MG PO TABS: 2.0000 | ORAL_TABLET | ORAL | Status: DC | PRN

## 2014-05-26 MED ORDER — OXYCODONE-ACETAMINOPHEN 5-325 MG PO TABS
1.0000 | ORAL_TABLET | Freq: Once | ORAL | Status: AC
  Administered 2014-05-26: 1 via ORAL
  Filled 2014-05-26: qty 1

## 2014-05-26 MED ORDER — PROMETHAZINE HCL 25 MG/ML IJ SOLN
25.0000 mg | Freq: Once | INTRAMUSCULAR | Status: AC
  Administered 2014-05-26: 25 mg via INTRAMUSCULAR
  Filled 2014-05-26: qty 1

## 2014-05-26 NOTE — MAU Note (Signed)
Pt states she woke up during the night with vomiting & R mid abd pain.  Denies diarrhea, no bleeding.

## 2014-05-26 NOTE — Discharge Instructions (Signed)
Abdominal Pain During Pregnancy Abdominal pain is common in pregnancy. Most of the time, it does not cause harm. There are many causes of abdominal pain. Some causes are more serious than others. Some of the causes of abdominal pain in pregnancy are easily diagnosed. Occasionally, the diagnosis takes time to understand. Other times, the cause is not determined. Abdominal pain can be a sign that something is very wrong with the pregnancy, or the pain may have nothing to do with the pregnancy at all. For this reason, always tell your health care provider if you have any abdominal discomfort. HOME CARE INSTRUCTIONS  Monitor your abdominal pain for any changes. The following actions may help to alleviate any discomfort you are experiencing:  Do not have sexual intercourse or put anything in your vagina until your symptoms go away completely.  Get plenty of rest until your pain improves.  Drink clear fluids if you feel nauseous. Avoid solid food as long as you are uncomfortable or nauseous.  Only take over-the-counter or prescription medicine as directed by your health care provider.  Keep all follow-up appointments with your health care provider. SEEK IMMEDIATE MEDICAL CARE IF:  You are bleeding, leaking fluid, or passing tissue from the vagina.  You have increasing pain or cramping.  You have persistent vomiting.  You have painful or bloody urination.  You have a fever.  You notice a decrease in your baby's movements.  You have extreme weakness or feel faint.  You have shortness of breath, with or without abdominal pain.  You develop a severe headache with abdominal pain.  You have abnormal vaginal discharge with abdominal pain.  You have persistent diarrhea.  You have abdominal pain that continues even after rest, or gets worse. MAKE SURE YOU:   Understand these instructions.  Will watch your condition.  Will get help right away if you are not doing well or get  worse. Document Released: 06/16/2005 Document Revised: 04/06/2013 Document Reviewed: 01/13/2013 [facility] Patient Information 2015 [place], [place]. This information is not intended to replace advice given to you by your health care provider. Make sure you discuss any questions you have with your health care provider. First Trimester of Pregnancy The first trimester of pregnancy is from week 1 until the end of week 12 (months 1 through 3). A week after a sperm fertilizes an egg, the egg will implant on the wall of the uterus. This embryo will begin to develop into a baby. Genes from you and your partner are forming the baby. The female genes determine whether the baby is a boy or a girl. At 6-8 weeks, the eyes and face are formed, and the heartbeat can be seen on ultrasound. At the end of 12 weeks, all the baby's organs are formed.  Now that you are pregnant, you will want to do everything you can to have a healthy baby. Two of the most important things are to get good prenatal care and to follow your health care provider's instructions. Prenatal care is all the medical care you receive before the baby's birth. This care will help prevent, find, and treat any problems during the pregnancy and childbirth. BODY CHANGES Your body goes through many changes during pregnancy. The changes vary from woman to woman.   You may gain or lose a couple of pounds at first.  You may feel sick to your stomach (nauseous) and throw up (vomit). If the vomiting is uncontrollable, call your health care provider.  You may tire easily.  You may develop headaches that can be relieved by medicines approved by your health care provider.  You may urinate more often. Painful urination may mean you have a bladder infection.  You may develop heartburn as a result of your pregnancy.  You may develop constipation because certain hormones are causing the muscles that push waste through your intestines to slow down.  You may  develop hemorrhoids or swollen, bulging veins (varicose veins).  Your breasts may begin to grow larger and become tender. Your nipples may stick out more, and the tissue that surrounds them (areola) may become darker.  Your gums may bleed and may be sensitive to brushing and flossing.  Dark spots or blotches (chloasma, mask of pregnancy) may develop on your face. This will likely fade after the baby is born.  Your menstrual periods will stop.  You may have a loss of appetite.  You may develop cravings for certain kinds of food.  You may have changes in your emotions from day to day, such as being excited to be pregnant or being concerned that something may go wrong with the pregnancy and baby.  You may have more vivid and strange dreams.  You may have changes in your hair. These can include thickening of your hair, rapid growth, and changes in texture. Some women also have hair loss during or after pregnancy, or hair that feels dry or thin. Your hair will most likely return to normal after your baby is born. WHAT TO EXPECT AT YOUR PRENATAL VISITS During a routine prenatal visit:  You will be weighed to make sure you and the baby are growing normally.  Your blood pressure will be taken.  Your abdomen will be measured to track your baby's growth.  The fetal heartbeat will be listened to starting around week 10 or 12 of your pregnancy.  Test results from any previous visits will be discussed. Your health care provider may ask you:  How you are feeling.  If you are feeling the baby move.  If you have had any abnormal symptoms, such as leaking fluid, bleeding, severe headaches, or abdominal cramping.  If you have any questions. Other tests that may be performed during your first trimester include:  Blood tests to find your blood type and to check for the presence of any previous infections. They will also be used to check for low iron levels (anemia) and Rh antibodies. Later in  the pregnancy, blood tests for diabetes will be done along with other tests if problems develop.  Urine tests to check for infections, diabetes, or protein in the urine.  An ultrasound to confirm the proper growth and development of the baby.  An amniocentesis to check for possible genetic problems.  Fetal screens for spina bifida and Down syndrome.  You may need other tests to make sure you and the baby are doing well. HOME CARE INSTRUCTIONS  Medicines  Follow your health care provider's instructions regarding medicine use. Specific medicines may be either safe or unsafe to take during pregnancy.  Take your prenatal vitamins as directed.  If you develop constipation, try taking a stool softener if your health care provider approves. Diet  Eat regular, well-balanced meals. Choose a variety of foods, such as meat or vegetable-based protein, fish, milk and low-fat dairy products, vegetables, fruits, and whole grain breads and cereals. Your health care provider will help you determine the amount of weight gain that is right for you.  Avoid raw meat and uncooked cheese. These  carry germs that can cause birth defects in the baby.  Eating four or five small meals rather than three large meals a day may help relieve nausea and vomiting. If you start to feel nauseous, eating a few soda crackers can be helpful. Drinking liquids between meals instead of during meals also seems to help nausea and vomiting.  If you develop constipation, eat more high-fiber foods, such as fresh vegetables or fruit and whole grains. Drink enough fluids to keep your urine clear or pale yellow. Activity and Exercise  Exercise only as directed by your health care provider. Exercising will help you:  Control your weight.  Stay in shape.  Be prepared for labor and delivery.  Experiencing pain or cramping in the lower abdomen or low back is a good sign that you should stop exercising. Check with your health care  provider before continuing normal exercises.  Try to avoid standing for long periods of time. Move your legs often if you must stand in one place for a long time.  Avoid heavy lifting.  Wear low-heeled shoes, and practice good posture.  You may continue to have sex unless your health care provider directs you otherwise. Relief of Pain or Discomfort  Wear a good support bra for breast tenderness.   Take warm sitz baths to soothe any pain or discomfort caused by hemorrhoids. Use hemorrhoid cream if your health care provider approves.   Rest with your legs elevated if you have leg cramps or low back pain.  If you develop varicose veins in your legs, wear support hose. Elevate your feet for 15 minutes, 3-4 times a day. Limit salt in your diet. Prenatal Care  Schedule your prenatal visits by the twelfth week of pregnancy. They are usually scheduled monthly at first, then more often in the last 2 months before delivery.  Write down your questions. Take them to your prenatal visits.  Keep all your prenatal visits as directed by your health care provider. Safety  Wear your seat belt at all times when driving.  Make a list of emergency phone numbers, including numbers for family, friends, the hospital, and police and fire departments. General Tips  Ask your health care provider for a referral to a local prenatal education class. Begin classes no later than at the beginning of month 6 of your pregnancy.  Ask for help if you have counseling or nutritional needs during pregnancy. Your health care provider can offer advice or refer you to specialists for help with various needs.  Do not use hot tubs, steam rooms, or saunas.  Do not douche or use tampons or scented sanitary pads.  Do not cross your legs for long periods of time.  Avoid cat litter boxes and soil used by cats. These carry germs that can cause birth defects in the baby and possibly loss of the fetus by miscarriage or  stillbirth.  Avoid all smoking, herbs, alcohol, and medicines not prescribed by your health care provider. Chemicals in these affect the formation and growth of the baby.  Schedule a dentist appointment. At home, brush your teeth with a soft toothbrush and be gentle when you floss. SEEK MEDICAL CARE IF:   You have dizziness.  You have mild pelvic cramps, pelvic pressure, or nagging pain in the abdominal area.  You have persistent nausea, vomiting, or diarrhea.  You have a bad smelling vaginal discharge.  You have pain with urination.  You notice increased swelling in your face, hands, legs, or ankles. SEEK  IMMEDIATE MEDICAL CARE IF:   You have a fever.  You are leaking fluid from your vagina.  You have spotting or bleeding from your vagina.  You have severe abdominal cramping or pain.  You have rapid weight gain or loss.  You vomit blood or material that looks like coffee grounds.  You are exposed to [place] measles and have never had them.  You are exposed to fifth disease or chickenpox.  You develop a severe headache.  You have shortness of breath.  You have any kind of trauma, such as from a fall or a car accident. Document Released: 06/10/2001 Document Revised: 10/31/2013 Document Reviewed: 04/26/2013 [facility] Patient Information 2015 [place], [place]. This information is not intended to replace advice given to you by your health care provider. Make sure you discuss any questions you have with your health care provider. Ectopic Pregnancy An ectopic pregnancy is when the fertilized egg attaches (implants) outside the uterus. Most ectopic pregnancies occur in the fallopian tube. Rarely do ectopic pregnancies occur on the ovary, intestine, pelvis, or cervix. In an ectopic pregnancy, the fertilized egg does not have the ability to develop into a normal, healthy baby.  A ruptured ectopic pregnancy is one in which the fallopian tube gets torn or bursts and results in internal  bleeding. Often there is intense abdominal pain, and sometimes, vaginal bleeding. Having an ectopic pregnancy can be life threatening. If left untreated, this dangerous condition can lead to a blood transfusion, abdominal surgery, or even death. CAUSES  Damage to the fallopian tubes is the suspected cause in most ectopic pregnancies.  RISK FACTORS Depending on your circumstances, the risk of having an ectopic pregnancy will vary. The level of risk can be divided into three categories. High Risk  You have gone through infertility treatment.  You have had a previous ectopic pregnancy.  You have had previous tubal surgery.  You have had previous surgery to have the fallopian tubes tied (tubal ligation).  You have tubal problems or diseases.  You have been exposed to DES. DES is a medicine that was used until 1971 and had effects on babies whose mothers took the medicine.  You become pregnant while using an intrauterine device (IUD) for birth control. Moderate Risk  You have a history of infertility.  You have a history of a sexually transmitted infection (STI).  You have a history of pelvic inflammatory disease (PID).  You have scarring from endometriosis.  You have multiple sexual partners.  You smoke. Low Risk  You have had previous pelvic surgery.  You use vaginal douching.  You became sexually active before 25 years of age. SIGNS AND SYMPTOMS  An ectopic pregnancy should be suspected in anyone who has missed a period and has abdominal pain or bleeding.  You may experience normal pregnancy symptoms, such as:  Nausea.  Tiredness.  Breast tenderness.  Other symptoms may include:  Pain with intercourse.  Irregular vaginal bleeding or spotting.  Cramping or pain on one side or in the lower abdomen.  Fast heartbeat.  Passing out while having a bowel movement.  Symptoms of a ruptured ectopic pregnancy and internal bleeding may include:  Sudden, severe pain  in the abdomen and pelvis.  Dizziness or fainting.  Pain in the shoulder area. DIAGNOSIS  Tests that may be performed include:  A pregnancy test.  An ultrasound test.  Testing the specific level of pregnancy hormone in the bloodstream.  Taking a sample of uterus tissue (dilation and curettage, D&C).  Surgery to perform a visual exam of the inside of the abdomen using a thin, lighted tube with a tiny camera on the end (laparoscope). TREATMENT  An injection of a medicine called methotrexate may be given. This medicine causes the pregnancy tissue to be absorbed. It is given if:  The diagnosis is made early.  The fallopian tube has not ruptured.  You are considered to be a good candidate for the medicine. Usually, pregnancy hormone blood levels are checked after methotrexate treatment. This is to be sure the medicine is effective. It may take 4-6 weeks for the pregnancy to be absorbed (though most pregnancies will be absorbed by 3 weeks). Surgical treatment may be needed. A laparoscope may be used to remove the pregnancy tissue. If severe internal bleeding occurs, a cut (incision) may be made in the lower abdomen (laparotomy), and the ectopic pregnancy is removed. This stops the bleeding. Part of the fallopian tube, or the whole tube, may be removed as well (salpingectomy). After surgery, pregnancy hormone tests may be done to be sure there is no pregnancy tissue left. You may receive a Rho (D) immune globulin shot if you are Rh negative and the father is Rh positive, or if you do not know the Rh type of the father. This is to prevent problems with any future pregnancy. SEEK IMMEDIATE MEDICAL CARE IF:  You have any symptoms of an ectopic pregnancy. This is a medical emergency. MAKE SURE YOU:  Understand these instructions.  Will watch your condition.  Will get help right away if you are not doing well or get worse. Document Released: 07/24/2004 Document Revised: 10/31/2013 Document  Reviewed: 01/13/2013 [facility] Patient Information 2015 [place], [place]. This information is not intended to replace advice given to you by your health care provider. Make sure you discuss any questions you have with your health care provider.

## 2014-05-26 NOTE — MAU Provider Note (Signed)
History     CSN: [id number]  Arrival date and time: 05/26/14 2229   First Provider Initiated Contact with Patient 05/26/14 1020      Chief Complaint  Patient presents with  . Emesis During Pregnancy   HPI Comments: Katrina [name] 23 y.o. [id number] [redacted]w[redacted]d presents to MAU with right sided pelvic pain that started last night about 10 pm while she was getting ready for bed. She was unable to sleep through out the night as she could never get comfortable. Her pain is 8/10 and she took phenergan for nausea at 8:30 am but threw it up. She has had nausea and vomiting with pain, she denies any bleeding. The pain comes and goes in waves.      Past Medical History  Diagnosis Date  . Asthma   . Herpes simplex of female genitalia     last outbreak 4 months ago  . Migraines   . Vaginal Pap smear, abnormal     Past Surgical History  Procedure Laterality Date  . Colposcopy  03/2014    Family History  Problem Relation Age of Onset  . Asthma Mother   . Asthma Sister   . Asthma Sister     History  Substance Use Topics  . Smoking status: Former Smoker -- 0.25 packs/day for 2 years    Types: Cigarettes    Quit date: 05/02/2013  . Smokeless tobacco: Not on file  . Alcohol Use: No     Comment: socially    Allergies:  Allergies  Allergen Reactions  . Shellfish Allergy Swelling    Prescriptions prior to admission  Medication Sig Dispense Refill Last Dose  . albuterol (PROVENTIL HFA;VENTOLIN HFA) 108 (90 BASE) MCG/ACT inhaler Inhale 2 puffs into the lungs every 6 (six) hours as needed for wheezing or shortness of breath.   Past Week at Unknown time  . promethazine (PHENERGAN) 25 MG tablet Take 1 tablet (25 mg total) by mouth every 6 (six) hours as needed for nausea or vomiting. 30 tablet 2 05/26/2014 at Unknown time    Review of Systems  Constitutional: Negative.   HENT: Negative.   Eyes: Negative.   Respiratory: Negative.   Cardiovascular: Negative.   Gastrointestinal:  Positive for nausea, vomiting and abdominal pain.  Genitourinary: Negative.   Musculoskeletal: Negative.   Skin: Negative.   Neurological: Negative.   Psychiatric/Behavioral: Negative.    Physical Exam   Blood pressure 118/62, pulse 75, temperature 98.1 F (36.7 C), temperature source Oral, resp. rate 18, last menstrual period 04/03/2014.  Physical Exam  Constitutional: She is oriented to person, place, and time. She appears well-developed and well-nourished. No distress.  Uncomfortable  HENT:  Head: Normocephalic and atraumatic.  Eyes: Pupils are equal, round, and reactive to light.  Cardiovascular: Normal rate, regular rhythm and normal heart sounds.   Respiratory: Effort normal and breath sounds normal. No respiratory distress.  GI: Soft. Bowel sounds are normal. She exhibits no distension. There is tenderness. There is no rebound and no guarding.  Musculoskeletal: Normal range of motion.  Neurological: She is alert and oriented to person, place, and time.  Skin: Skin is warm and dry.  Psychiatric: She has a normal mood and affect. Her behavior is normal. Thought content normal.   Results for orders placed or performed during the hospital encounter of 05/26/14 (from the past 24 hour(s))  Urinalysis, Routine w reflex microscopic     Status: Abnormal   Collection Time: 05/26/14  9:35 AM  Result Value Ref  Range   Color, Urine YELLOW YELLOW   APPearance CLEAR CLEAR   Specific Gravity, Urine >1.030 (H) 1.005 - 1.030   pH 5.5 5.0 - 8.0   Glucose, UA NEGATIVE NEGATIVE mg/dL   Hgb urine dipstick MODERATE (A) NEGATIVE   Bilirubin Urine NEGATIVE NEGATIVE   Ketones, ur NEGATIVE NEGATIVE mg/dL   Protein, ur NEGATIVE NEGATIVE mg/dL   Urobilinogen, UA 0.2 0.0 - 1.0 mg/dL   Nitrite NEGATIVE NEGATIVE   Leukocytes, UA NEGATIVE NEGATIVE  Urine microscopic-add on     Status: None   Collection Time: 05/26/14  9:35 AM  Result Value Ref Range   Squamous Epithelial / LPF RARE RARE   WBC, UA  0-2 <3 WBC/hpf   RBC / HPF 3-6 <3 RBC/hpf  CBC     Status: None   Collection Time: 05/26/14 10:24 AM  Result Value Ref Range   WBC 6.2 4.0 - 10.5 K/uL   RBC 4.11 3.87 - 5.11 MIL/uL   Hemoglobin 12.9 12.0 - 15.0 g/dL   HCT 36.8 36.0 - 46.0 %   MCV 89.5 78.0 - 100.0 fL   MCH 31.4 26.0 - 34.0 pg   MCHC 35.1 30.0 - 36.0 g/dL   RDW 13.2 11.5 - 15.5 %   Platelets 250 150 - 400 K/uL  hCG, quantitative, pregnancy     Status: Abnormal   Collection Time: 05/26/14 10:24 AM  Result Value Ref Range   hCG, Beta Chain, Quant, S 2258 (H) <5 mIU/mL  . US Ob Transvaginal  05/26/2014   CLINICAL DATA:  Pulling and cramping on the right side since last night. Positive pregnancy test.  EXAM: TRANSVAGINAL OB ULTRASOUND  TECHNIQUE: Transvaginal ultrasound was performed for complete evaluation of the gestation as well as the maternal uterus, adnexal regions, and pelvic cul-de-sac.  COMPARISON:  05/21/2014  FINDINGS: Intrauterine gestational sac: Probable  Yolk sac:  None  Embryo:  None  Cardiac Activity: None  MSD: 4.5  mm   5 w   2  d  [place] EDC: 01/24/2015  Maternal uterus/adnexae: Normal appearance of the left ovary measuring 2.6 x 1.4 x 2.6 cm. Right ovary measures 4.2 x 2.3 x 3.3 cm. There is a heterogeneous structure within the right ovary that measures up to 2.6 cm. There is a small hypoechoic structure in the uterus fundus region thought to represent a small gestational sac. Small amount of free fluid.  IMPRESSION: Small hypoechoic structure in the uterus probably represents a gestational sac. If this is a gestational sac, the calculated gestational age is 5 weeks and 2 days.  2.6 cm complex structure involving the right ovary. Differential would include a corpus luteum versus complex cyst.   Electronically Signed   By: Markus Daft M.D.   On: 05/26/2014 12:09    MAU Course  Procedures  MDM CBC Phenergan 25 mg IM/ percocet/ pain now 3/10 U/S OB  Spoke with Dr Elonda Husky who reviewed the ultrasound film and  advised repeat quant today and Sunday. Obtain serum progesterone.   Assessment and Plan   A: Right sided pain in pregnancy  P: Ectopic Precautions including pelvic rest Phenergan/ percocet for pain Note for work till Tuesday Repeat Quant 48 hours Has repeat ultrasound scheduled Wednesday   Georgia Duff 05/26/2014, 2:19 PM

## 2014-05-28 ENCOUNTER — Inpatient Hospital Stay (HOSPITAL_COMMUNITY)
Admission: AD | Admit: 2014-05-28 | Discharge: 2014-05-28 | Disposition: A | Discharge status: Home / Self Care | Payer: Medicaid Other | Source: Ambulatory Visit | Attending: Obstetrics & Gynecology | Admitting: Obstetrics & Gynecology

## 2014-05-28 DIAGNOSIS — Z3A01 Less than 8 weeks gestation of pregnancy: Secondary | ICD-10-CM | POA: Diagnosis not present

## 2014-05-28 DIAGNOSIS — O26891 Other specified pregnancy related conditions, first trimester: Secondary | ICD-10-CM | POA: Insufficient documentation

## 2014-05-28 DIAGNOSIS — Z87891 Personal history of nicotine dependence: Secondary | ICD-10-CM | POA: Insufficient documentation

## 2014-05-28 DIAGNOSIS — O9989 Other specified diseases and conditions complicating pregnancy, childbirth and the puerperium: Secondary | ICD-10-CM

## 2014-05-28 DIAGNOSIS — O26899 Other specified pregnancy related conditions, unspecified trimester: Secondary | ICD-10-CM

## 2014-05-28 DIAGNOSIS — R109 Unspecified abdominal pain: Secondary | ICD-10-CM | POA: Insufficient documentation

## 2014-05-28 LAB — HCG, QUANTITATIVE, PREGNANCY: hCG, Beta Chain, Quant, S: 5077 m[IU]/mL — ABNORMAL HIGH (ref <5)

## 2014-05-28 NOTE — MAU Note (Signed)
Pt presents to MAU for repeat labs. Denies any vaginal bleeding or pain 

## 2014-05-28 NOTE — MAU Provider Note (Signed)
History     CSN: [id number]  Arrival date and time: 05/28/14 1415  Seen by provider at 1540    Chief Complaint  Patient presents with  . Labs Only   HPI Katrina [name] 23 y.o. [redacted]w[redacted]d  Comes to MAU today for repeat quant.  Previous notes and ultrasound reports reviewed.  Is scheduled for an ultrasound on Wednesday.  Continues to have some abdominal pain as she had at her previous visit.  Pain has not worsened since she was seen here 2 days ago.  She is taking her pain medication periodically and it helps with her pain.  Denies any vaginal bleeding, nausea or vomiting.  OB History    Gravida Para Term Preterm AB TAB SAB Ectopic Multiple Living   3 2 2  0 0 0 0 0 0 2      Past Medical History  Diagnosis Date  . Asthma   . Herpes simplex of female genitalia     last outbreak 4 months ago  . Migraines   . Vaginal Pap smear, abnormal     Past Surgical History  Procedure Laterality Date  . Colposcopy  03/2014    Family History  Problem Relation Age of Onset  . Asthma Mother   . Asthma Sister   . Asthma Sister     History  Substance Use Topics  . Smoking status: Former Smoker -- 0.25 packs/day for 2 years    Types: Cigarettes    Quit date: 05/02/2013  . Smokeless tobacco: Not on file  . Alcohol Use: No     Comment: socially    Allergies:  Allergies  Allergen Reactions  . Shellfish Allergy Swelling    Prescriptions prior to admission  Medication Sig Dispense Refill Last Dose  . albuterol (PROVENTIL HFA;VENTOLIN HFA) 108 (90 BASE) MCG/ACT inhaler Inhale 2 puffs into the lungs every 6 (six) hours as needed for wheezing or shortness of breath.   Past Week at Unknown time  . oxyCODONE-acetaminophen (PERCOCET/ROXICET) 5-325 MG per tablet Take 2 tablets by mouth every 4 (four) hours as needed for moderate pain or severe pain. 10 tablet 0   . promethazine (PHENERGAN) 25 MG tablet Take 1 tablet (25 mg total) by mouth every 6 (six) hours as needed for nausea or vomiting.  30 tablet 2 05/26/2014 at Unknown time    ROS  See HPI  Physical Exam   Blood pressure 123/58, pulse 76, temperature 98.4 F (36.9 C), resp. rate 16, last menstrual period 04/03/2014.  Physical Exam  Nursing note and vitals reviewed. Constitutional: She is oriented to person, place, and time. She appears well-developed and well-nourished. No distress.  HENT:  Head: Normocephalic.  Eyes: EOM are normal.  Neck: Neck supple.  Respiratory: Effort normal.  Musculoskeletal: Normal range of motion.  Neurological: She is alert and oriented to person, place, and time.  Skin: Skin is warm and dry.  Psychiatric: She has a normal mood and affect.    MAU Course  Procedures  MDM Results for COILA, WARDELL (MRN [id number]) as of 05/28/2014 15:41  Ref. Range 05/23/2014 17:55 05/26/2014 09:35 05/26/2014 10:24 05/26/2014 11:56 05/28/2014 14:24  hCG, Beta Chain, Quant, S Latest Range: <5 mIU/mL 899 (H)  2258 (H)  5077 (H)    Assessment and Plan  Appropriately rising quants Abdominal pain in pregnancy  Plan Reviewed the results with the client. Plans to come to her ultrasound appointment on Wednesday. Advised to return sooner if her symptoms worsen.   Katrina [name] 05/28/2014,  3:43 PM

## 2014-05-28 NOTE — Discharge Instructions (Signed)
Keep your ultrasound appointment on Wednesday. Return sooner if your symptoms worsen.

## 2014-05-30 ENCOUNTER — Other Ambulatory Visit (HOSPITAL_COMMUNITY): Payer: Self-pay | Admitting: Advanced Practice Midwife

## 2014-05-30 DIAGNOSIS — R109 Unspecified abdominal pain: Principal | ICD-10-CM

## 2014-05-30 DIAGNOSIS — O26899 Other specified pregnancy related conditions, unspecified trimester: Secondary | ICD-10-CM

## 2014-05-31 ENCOUNTER — Encounter (HOSPITAL_COMMUNITY): Payer: Self-pay | Admitting: Obstetrics and Gynecology

## 2014-05-31 ENCOUNTER — Ambulatory Visit (HOSPITAL_COMMUNITY)
Admission: RE | Admit: 2014-05-31 | Discharge: 2014-05-31 | Disposition: A | Discharge status: Home / Self Care | Payer: Medicaid Other | Source: Ambulatory Visit | Attending: Advanced Practice Midwife | Admitting: Advanced Practice Midwife

## 2014-05-31 ENCOUNTER — Other Ambulatory Visit (HOSPITAL_COMMUNITY): Payer: Self-pay | Admitting: Obstetrics and Gynecology

## 2014-05-31 ENCOUNTER — Telehealth (HOSPITAL_COMMUNITY): Payer: Self-pay | Admitting: Obstetrics and Gynecology

## 2014-05-31 DIAGNOSIS — Z3A01 Less than 8 weeks gestation of pregnancy: Secondary | ICD-10-CM | POA: Insufficient documentation

## 2014-05-31 DIAGNOSIS — O9989 Other specified diseases and conditions complicating pregnancy, childbirth and the puerperium: Secondary | ICD-10-CM | POA: Insufficient documentation

## 2014-05-31 DIAGNOSIS — R109 Unspecified abdominal pain: Secondary | ICD-10-CM | POA: Diagnosis not present

## 2014-05-31 DIAGNOSIS — O26899 Other specified pregnancy related conditions, unspecified trimester: Secondary | ICD-10-CM

## 2014-05-31 MED ORDER — PRENATAL COMPLETE 14-0.4 MG PO TABS: 1.0000 | ORAL_TABLET | Freq: Every day | ORAL | Status: DC

## 2014-05-31 NOTE — Telephone Encounter (Signed)
Discussed [place] results with the patient. Progression from gestational sac to now a gestational sac and yolk sac explained. Patient denies pain or bleeding. Prenatal vitamin RX given along with pregnancy verification letter.

## 2014-06-05 ENCOUNTER — Inpatient Hospital Stay (HOSPITAL_COMMUNITY)
Admission: AD | Admit: 2014-06-05 | Discharge: 2014-06-06 | Disposition: A | Discharge status: Home / Self Care | Payer: Medicaid Other | Source: Ambulatory Visit | Attending: Family Medicine | Admitting: Family Medicine

## 2014-06-05 DIAGNOSIS — Z87891 Personal history of nicotine dependence: Secondary | ICD-10-CM | POA: Insufficient documentation

## 2014-06-05 DIAGNOSIS — O219 Vomiting of pregnancy, unspecified: Secondary | ICD-10-CM

## 2014-06-05 DIAGNOSIS — Z3A01 Less than 8 weeks gestation of pregnancy: Secondary | ICD-10-CM | POA: Diagnosis not present

## 2014-06-05 DIAGNOSIS — O210 Mild hyperemesis gravidarum: Secondary | ICD-10-CM | POA: Diagnosis present

## 2014-06-05 DIAGNOSIS — N390 Urinary tract infection, site not specified: Secondary | ICD-10-CM

## 2014-06-05 DIAGNOSIS — O2341 Unspecified infection of urinary tract in pregnancy, first trimester: Secondary | ICD-10-CM | POA: Insufficient documentation

## 2014-06-05 NOTE — MAU Note (Signed)
Pt reports she cannot stop vomiting, states she is taking the meds we gave her but she can't keep the meds down. States this has been happening x 4 days.

## 2014-06-06 ENCOUNTER — Encounter (HOSPITAL_COMMUNITY): Payer: Self-pay | Admitting: *Deleted

## 2014-06-06 DIAGNOSIS — O219 Vomiting of pregnancy, unspecified: Secondary | ICD-10-CM

## 2014-06-06 DIAGNOSIS — O2341 Unspecified infection of urinary tract in pregnancy, first trimester: Secondary | ICD-10-CM

## 2014-06-06 DIAGNOSIS — Z3A01 Less than 8 weeks gestation of pregnancy: Secondary | ICD-10-CM

## 2014-06-06 LAB — URINE MICROSCOPIC-ADD ON

## 2014-06-06 LAB — URINALYSIS, ROUTINE W REFLEX MICROSCOPIC
Bilirubin Urine: NEGATIVE
GLUCOSE, UA: NEGATIVE mg/dL
KETONES UR: NEGATIVE mg/dL
Nitrite: POSITIVE — AB
PROTEIN: NEGATIVE mg/dL
Specific Gravity, Urine: 1.02 (ref 1.005–1.030)
Urobilinogen, UA: 0.2 mg/dL (ref 0.0–1.0)
pH: 7 (ref 5.0–8.0)

## 2014-06-06 MED ORDER — METOCLOPRAMIDE HCL 10 MG PO TABS: 10.0000 mg | ORAL_TABLET | Freq: Three times a day (TID) | ORAL | Status: DC

## 2014-06-06 MED ORDER — SODIUM CHLORIDE 0.9 % IV SOLN
25.0000 mg | Freq: Once | INTRAVENOUS | Status: AC
  Administered 2014-06-06: 25 mg via INTRAVENOUS
  Filled 2014-06-06: qty 1

## 2014-06-06 MED ORDER — CEFTRIAXONE SODIUM IN DEXTROSE 20 MG/ML IV SOLN
1.0000 g | Freq: Once | INTRAVENOUS | Status: AC
  Administered 2014-06-06: 1 g via INTRAVENOUS
  Filled 2014-06-06: qty 50

## 2014-06-06 NOTE — MAU Provider Note (Signed)
History     CSN: [id number]  Arrival date and time: 06/05/14 2254   First Provider Initiated Contact with Patient 06/06/14 0017      Chief Complaint  Patient presents with  . Hyperemesis Gravidarum   HPI  Katrina [name] is a 23 y.o. [id number] at [redacted]w[redacted]d who presents today with nausea and vomiting. She states that she had a phenergan shot, and she was fine for a few day. Then on Thursday she states she started vomiting every time she ate.   Past Medical History  Diagnosis Date  . Asthma   . Herpes simplex of female genitalia     last outbreak 4 months ago  . Migraines   . Vaginal Pap smear, abnormal     Past Surgical History  Procedure Laterality Date  . Colposcopy  03/2014    Family History  Problem Relation Age of Onset  . Asthma Mother   . Asthma Sister   . Asthma Sister     History  Substance Use Topics  . Smoking status: Former Smoker -- 0.25 packs/day for 2 years    Types: Cigarettes    Quit date: 05/02/2013  . Smokeless tobacco: Not on file  . Alcohol Use: No     Comment: socially    Allergies:  Allergies  Allergen Reactions  . Shellfish Allergy Swelling    Prescriptions prior to admission  Medication Sig Dispense Refill Last Dose  . albuterol (PROVENTIL HFA;VENTOLIN HFA) 108 (90 BASE) MCG/ACT inhaler Inhale 2 puffs into the lungs every 6 (six) hours as needed for wheezing or shortness of breath.   Past Week at Unknown time  . oxyCODONE-acetaminophen (PERCOCET/ROXICET) 5-325 MG per tablet Take 2 tablets by mouth every 4 (four) hours as needed for moderate pain or severe pain. 10 tablet 0   . Prenatal Vit-Fe Fumarate-FA (PRENATAL COMPLETE) 14-0.4 MG TABS Take 1 tablet by mouth daily. 30 each 2   . promethazine (PHENERGAN) 25 MG tablet Take 1 tablet (25 mg total) by mouth every 6 (six) hours as needed for nausea or vomiting. 30 tablet 2 05/26/2014 at Unknown time    ROS Physical Exam   Blood pressure 122/66, pulse 66, resp. rate 18, height 5\' 1"  (1.549  m), weight 73.483 kg (162 lb), last menstrual period 04/03/2014, SpO2 100 %.  Physical Exam  Nursing note and vitals reviewed. Constitutional: She is oriented to person, place, and time. She appears well-developed and well-nourished. No distress.  Cardiovascular: Normal rate.   Respiratory: Effort normal.  GI: Soft. There is no tenderness. There is no rebound.  Neurological: She is alert and oriented to person, place, and time.  Skin: Skin is warm and dry.  Psychiatric: She has a normal mood and affect.    MAU Course  Procedures  Results for orders placed or performed during the hospital encounter of 06/05/14 (from the past 24 hour(s))  Urinalysis, Routine w reflex microscopic     Status: Abnormal   Collection Time: 06/05/14 11:20 PM  Result Value Ref Range   Color, Urine YELLOW YELLOW   APPearance CLEAR CLEAR   Specific Gravity, Urine 1.020 1.005 - 1.030   pH 7.0 5.0 - 8.0   Glucose, UA NEGATIVE NEGATIVE mg/dL   Hgb urine dipstick SMALL (A) NEGATIVE   Bilirubin Urine NEGATIVE NEGATIVE   Ketones, ur NEGATIVE NEGATIVE mg/dL   Protein, ur NEGATIVE NEGATIVE mg/dL   Urobilinogen, UA 0.2 0.0 - 1.0 mg/dL   Nitrite POSITIVE (A) NEGATIVE   Leukocytes, UA TRACE (  A) NEGATIVE  Urine microscopic-add on     Status: Abnormal   Collection Time: 06/05/14 11:20 PM  Result Value Ref Range   Squamous Epithelial / LPF FEW (A) RARE   WBC, UA 0-2 <3 WBC/hpf   RBC / HPF 0-2 <3 RBC/hpf   Bacteria, UA MANY (A) RARE     Assessment and Plan   1. UTI (lower urinary tract infection)   2. Nausea/vomiting in pregnancy    Treated here with 1g rocephin for UTI Reglan 10mg  TID, phenergan at bedtime Unisom/B6 daily Small frequent meals Return to MAU as needed  Follow-up Information    Follow up with Olga Millers, MD.   Specialty:  Obstetrics and Gynecology   Contact information:   [place] [id number] [phone]        Mathis Bud 06/06/2014, 12:19 AM

## 2014-06-06 NOTE — Discharge Instructions (Signed)
Nausea medication to take during pregnancy:  ° °Unisom (doxylamine succinate 25 mg tablets) Take one tablet daily at bedtime. If symptoms are not adequately controlled, the dose can be increased to a maximum recommended dose of two tablets daily (1/2 tablet in the morning, 1/2 tablet mid-afternoon and one at bedtime). ° °Vitamin B6 100mg tablets. Take one tablet twice a day (up to 200 mg per day). ° ° ° °Morning Sickness °Morning sickness is when you feel sick to your stomach (nauseous) during pregnancy. This nauseous feeling may or may not come with vomiting. It often occurs in the morning but can be a problem any time of day. Morning sickness is most common during the first trimester, but it may continue throughout pregnancy. While morning sickness is unpleasant, it is usually harmless unless you develop severe and continual vomiting (hyperemesis gravidarum). This condition requires more intense treatment.  °CAUSES  °The cause of morning sickness is not completely known but seems to be related to normal hormonal changes that occur in pregnancy. °RISK FACTORS °You are at greater risk if you: °· Experienced nausea or vomiting before your pregnancy. °· Had morning sickness during a previous pregnancy. °· Are pregnant with more than one baby, such as twins. °TREATMENT  °Do not use any medicines (prescription, over-the-counter, or herbal) for morning sickness without first talking to your health care provider. Your health care provider may prescribe or recommend: °· Vitamin B6 supplements. °· Anti-nausea medicines. °· The herbal medicine ginger. °HOME CARE INSTRUCTIONS  °· Only take over-the-counter or prescription medicines as directed by your health care provider. °· Taking multivitamins before getting pregnant can prevent or decrease the severity of morning sickness in most women. °· Eat a piece of dry toast or unsalted crackers before getting out of bed in the morning. °· Eat five or six small meals a day. °· Eat  dry and bland foods (rice, baked potato). Foods high in carbohydrates are often helpful. °· Do not drink liquids with your meals. Drink liquids between meals. °· Avoid greasy, fatty, and spicy foods. °· Get someone to cook for you if the smell of any food causes nausea and vomiting. °· If you feel nauseous after taking prenatal vitamins, take the vitamins at night or with a snack.  °· Snack on protein foods (nuts, yogurt, cheese) between meals if you are hungry. °· Eat unsweetened gelatins for desserts. °· Wearing an acupressure wristband (worn for sea sickness) may be helpful. °· Acupuncture may be helpful. °· Do not smoke. °· Get a humidifier to keep the air in your house free of odors. °· Get plenty of fresh air. °SEEK MEDICAL CARE IF:  °· Your home remedies are not working, and you need medicine. °· You feel dizzy or lightheaded. °· You are losing weight. °SEEK IMMEDIATE MEDICAL CARE IF:  °· You have persistent and uncontrolled nausea and vomiting. °· You pass out (faint). °MAKE SURE YOU: °· Understand these instructions. °· Will watch your condition. °· Will get help right away if you are not doing well or get worse. °Document Released: 08/07/2006 Document Revised: 06/21/2013 Document Reviewed: 12/01/2012 °[employer]® Patient Information ©2015 [employer]. This information is not intended to replace advice given to you by your health care provider. Make sure you discuss any questions you have with your health care provider. ° °

## 2014-06-14 ENCOUNTER — Ambulatory Visit: Payer: Self-pay | Admitting: Obstetrics & Gynecology

## 2014-06-14 ENCOUNTER — Telehealth: Payer: Self-pay | Admitting: *Deleted

## 2014-06-14 ENCOUNTER — Encounter: Payer: Self-pay | Admitting: *Deleted

## 2014-06-14 NOTE — Telephone Encounter (Signed)
Attempted to contact patient, no answer, unable to leave message.  Will send letter.  Letter sent.

## 2014-06-15 ENCOUNTER — Encounter: Payer: Self-pay | Admitting: *Deleted

## 2014-06-22 ENCOUNTER — Encounter (HOSPITAL_COMMUNITY): Payer: Self-pay | Admitting: *Deleted

## 2014-06-22 ENCOUNTER — Inpatient Hospital Stay (HOSPITAL_COMMUNITY)
Admission: AD | Admit: 2014-06-22 | Discharge: 2014-06-22 | Disposition: A | Discharge status: Home / Self Care | Payer: Medicaid Other | Source: Ambulatory Visit | Attending: Obstetrics & Gynecology | Admitting: Obstetrics & Gynecology

## 2014-06-22 DIAGNOSIS — O218 Other vomiting complicating pregnancy: Secondary | ICD-10-CM

## 2014-06-22 DIAGNOSIS — Z87891 Personal history of nicotine dependence: Secondary | ICD-10-CM | POA: Insufficient documentation

## 2014-06-22 DIAGNOSIS — N390 Urinary tract infection, site not specified: Secondary | ICD-10-CM

## 2014-06-22 DIAGNOSIS — O219 Vomiting of pregnancy, unspecified: Secondary | ICD-10-CM

## 2014-06-22 DIAGNOSIS — O26891 Other specified pregnancy related conditions, first trimester: Secondary | ICD-10-CM | POA: Diagnosis present

## 2014-06-22 DIAGNOSIS — O210 Mild hyperemesis gravidarum: Secondary | ICD-10-CM | POA: Diagnosis not present

## 2014-06-22 DIAGNOSIS — R109 Unspecified abdominal pain: Secondary | ICD-10-CM | POA: Diagnosis present

## 2014-06-22 DIAGNOSIS — Z3A09 9 weeks gestation of pregnancy: Secondary | ICD-10-CM | POA: Insufficient documentation

## 2014-06-22 DIAGNOSIS — O2341 Unspecified infection of urinary tract in pregnancy, first trimester: Secondary | ICD-10-CM

## 2014-06-22 LAB — URINALYSIS, ROUTINE W REFLEX MICROSCOPIC
Bilirubin Urine: NEGATIVE
Glucose, UA: NEGATIVE mg/dL
Ketones, ur: NEGATIVE mg/dL
Nitrite: NEGATIVE
PROTEIN: NEGATIVE mg/dL
Specific Gravity, Urine: 1.015 (ref 1.005–1.030)
UROBILINOGEN UA: 2 mg/dL — AB (ref 0.0–1.0)
pH: 8.5 — ABNORMAL HIGH (ref 5.0–8.0)

## 2014-06-22 LAB — COMPREHENSIVE METABOLIC PANEL
ALBUMIN: 3.8 g/dL (ref 3.5–5.2)
ALT: 40 U/L — AB (ref 0–35)
AST: 22 U/L (ref 0–37)
Alkaline Phosphatase: 71 U/L (ref 39–117)
Anion gap: 6 (ref 5–15)
BILIRUBIN TOTAL: 1.1 mg/dL (ref 0.3–1.2)
BUN: 6 mg/dL (ref 6–23)
CO2: 23 mmol/L (ref 19–32)
Calcium: 9.1 mg/dL (ref 8.4–10.5)
Chloride: 107 mEq/L (ref 96–112)
Creatinine, Ser: 0.47 mg/dL — ABNORMAL LOW (ref 0.50–1.10)
GFR calc Af Amer: >90 mL/min (ref >90)
GFR calc non Af Amer: >90 mL/min (ref >90)
Glucose, Bld: 92 mg/dL (ref 70–99)
Potassium: 3.4 mmol/L — ABNORMAL LOW (ref 3.5–5.1)
Sodium: 136 mmol/L (ref 135–145)
Total Protein: 7.1 g/dL (ref 6.0–8.3)

## 2014-06-22 LAB — CBC
HCT: 36.1 % (ref 36.0–46.0)
Hemoglobin: 13 g/dL (ref 12.0–15.0)
MCH: 31.6 pg (ref 26.0–34.0)
MCHC: 36 g/dL (ref 30.0–36.0)
MCV: 87.8 fL (ref 78.0–100.0)
Platelets: 251 10*3/uL (ref 150–400)
RBC: 4.11 MIL/uL (ref 3.87–5.11)
RDW: 13.3 % (ref 11.5–15.5)
WBC: 10.9 10*3/uL — AB (ref 4.0–10.5)

## 2014-06-22 LAB — URINE MICROSCOPIC-ADD ON

## 2014-06-22 LAB — WET PREP, GENITAL
Trich, Wet Prep: NONE SEEN
Yeast Wet Prep HPF POC: NONE SEEN

## 2014-06-22 LAB — HIV ANTIBODY (ROUTINE TESTING W REFLEX): HIV 1&2 Ab, 4th Generation: NONREACTIVE

## 2014-06-22 MED ORDER — PROMETHAZINE HCL 25 MG PO TABS: 25.0000 mg | ORAL_TABLET | Freq: Four times a day (QID) | ORAL | Status: DC | PRN

## 2014-06-22 MED ORDER — PROMETHAZINE HCL 25 MG PO TABS
25.0000 mg | ORAL_TABLET | Freq: Once | ORAL | Status: AC
  Administered 2014-06-22: 25 mg via ORAL
  Filled 2014-06-22: qty 1

## 2014-06-22 MED ORDER — OXYCODONE-ACETAMINOPHEN 5-325 MG PO TABS
1.0000 | ORAL_TABLET | Freq: Once | ORAL | Status: AC
  Administered 2014-06-22: 1 via ORAL
  Filled 2014-06-22: qty 1

## 2014-06-22 MED ORDER — CEPHALEXIN 500 MG PO CAPS: 500.0000 mg | ORAL_CAPSULE | Freq: Four times a day (QID) | ORAL | Status: DC

## 2014-06-22 NOTE — Discharge Instructions (Signed)
Morning Sickness Morning sickness is when you feel sick to your stomach (nauseous) during pregnancy. This nauseous feeling may or may not come with vomiting. It often occurs in the morning but can be a problem any time of day. Morning sickness is most common during the first trimester, but it may continue throughout pregnancy. While morning sickness is unpleasant, it is usually harmless unless you develop severe and continual vomiting (hyperemesis gravidarum). This condition requires more intense treatment.  CAUSES  The cause of morning sickness is not completely known but seems to be related to normal hormonal changes that occur in pregnancy. RISK FACTORS You are at greater risk if you:  Experienced nausea or vomiting before your pregnancy.  Had morning sickness during a previous pregnancy.  Are pregnant with more than one baby, such as twins. TREATMENT  Do not use any medicines (prescription, over-the-counter, or herbal) for morning sickness without first talking to your health care provider. Your health care provider may prescribe or recommend:  Vitamin B6 supplements.  Anti-nausea medicines.  The herbal medicine ginger. HOME CARE INSTRUCTIONS   Only take over-the-counter or prescription medicines as directed by your health care provider.  Taking multivitamins before getting pregnant can prevent or decrease the severity of morning sickness in most women.  Eat a piece of dry toast or unsalted crackers before getting out of bed in the morning.  Eat five or six small meals a day.  Eat dry and bland foods (rice, baked potato). Foods high in carbohydrates are often helpful.  Do not drink liquids with your meals. Drink liquids between meals.  Avoid greasy, fatty, and spicy foods.  Get someone to cook for you if the smell of any food causes nausea and vomiting.  If you feel nauseous after taking prenatal vitamins, take the vitamins at night or with a snack.  Snack on protein  foods (nuts, yogurt, cheese) between meals if you are hungry.  Eat unsweetened gelatins for desserts.  Wearing an acupressure wristband (worn for sea sickness) may be helpful.  Acupuncture may be helpful.  Do not smoke.  Get a humidifier to keep the air in your house free of odors.  Get plenty of fresh air. SEEK MEDICAL CARE IF:   Your home remedies are not working, and you need medicine.  You feel dizzy or lightheaded.  You are losing weight. SEEK IMMEDIATE MEDICAL CARE IF:   You have persistent and uncontrolled nausea and vomiting.  You pass out (faint). MAKE SURE YOU:  Understand these instructions.  Will watch your condition.  Will get help right away if you are not doing well or get worse. Document Released: 08/07/2006 Document Revised: 06/21/2013 Document Reviewed: 12/01/2012 [facility] Patient Information 2015 [place], [place]. This information is not intended to replace advice given to you by your health care provider. Make sure you discuss any questions you have with your health care provider. Urinary Tract Infection Urinary tract infections (UTIs) can develop anywhere along your urinary tract. Your urinary tract is your body's drainage system for removing wastes and extra water. Your urinary tract includes two kidneys, two ureters, a bladder, and a urethra. Your kidneys are a pair of bean-shaped organs. Each kidney is about the size of your fist. They are located below your ribs, one on each side of your spine. CAUSES Infections are caused by microbes, which are microscopic organisms, including fungi, viruses, and bacteria. These organisms are so small that they can only be seen through a microscope. Bacteria are the microbes that  most commonly cause UTIs. SYMPTOMS  Symptoms of UTIs may vary by age and gender of the patient and by the location of the infection. Symptoms in young women typically include a frequent and intense urge to urinate and a painful, burning feeling  in the bladder or urethra during urination. Older women and men are more likely to be tired, shaky, and weak and have muscle aches and abdominal pain. A fever may mean the infection is in your kidneys. Other symptoms of a kidney infection include pain in your back or sides below the ribs, nausea, and vomiting. DIAGNOSIS To diagnose a UTI, your caregiver will ask you about your symptoms. Your caregiver also will ask to provide a urine sample. The urine sample will be tested for bacteria and white blood cells. White blood cells are made by your body to help fight infection. TREATMENT  Typically, UTIs can be treated with medication. Because most UTIs are caused by a bacterial infection, they usually can be treated with the use of antibiotics. The choice of antibiotic and length of treatment depend on your symptoms and the type of bacteria causing your infection. HOME CARE INSTRUCTIONS  If you were prescribed antibiotics, take them exactly as your caregiver instructs you. Finish the medication even if you feel better after you have only taken some of the medication.  Drink enough water and fluids to keep your urine clear or pale yellow.  Avoid caffeine, tea, and carbonated beverages. They tend to irritate your bladder.  Empty your bladder often. Avoid holding urine for long periods of time.  Empty your bladder before and after sexual intercourse.  After a bowel movement, women should cleanse from front to back. Use each tissue only once. SEEK MEDICAL CARE IF:   You have back pain.  You develop a fever.  Your symptoms do not begin to resolve within 3 days. SEEK IMMEDIATE MEDICAL CARE IF:   You have severe back pain or lower abdominal pain.  You develop chills.  You have nausea or vomiting.  You have continued burning or discomfort with urination. MAKE SURE YOU:   Understand these instructions.  Will watch your condition.  Will get help right away if you are not doing well or get  worse. Document Released: 03/26/2005 Document Revised: 12/16/2011 Document Reviewed: 07/25/2011 [facility] Patient Information 2015 [place], [place]. This information is not intended to replace advice given to you by your health care provider. Make sure you discuss any questions you have with your health care provider.

## 2014-06-22 NOTE — MAU Note (Signed)
+  RT CVA tenderness

## 2014-06-22 NOTE — MAU Provider Note (Signed)
History     CSN: [id number]  Arrival date and time: 06/22/14 3244   First Provider Initiated Contact with Patient 06/22/14 [phone]      Chief Complaint  Patient presents with  . Abdominal Pain   HPI Comments: Katrina [name] 23 y.o. [id number] [redacted]w[redacted]d presents to MAU with right sided abdominal and back pain. This is the same pain that she has had and been seen for several times. She denies any urinary tract sx or fever. She has had some nausea and vomiting but not taken her phenergan as she ran out. She admits to a 10 lb weight loss due to nausea. She plan her OB care with [facility] and is awaiting Medicaid.   Abdominal Pain Associated symptoms include nausea and vomiting.      Past Medical History  Diagnosis Date  . Asthma   . Herpes simplex of female genitalia     last outbreak 4 months ago  . Migraines   . Vaginal Pap smear, abnormal     Past Surgical History  Procedure Laterality Date  . Colposcopy  03/2014    Family History  Problem Relation Age of Onset  . Asthma Mother   . Asthma Sister   . Asthma Sister     History  Substance Use Topics  . Smoking status: Former Smoker -- 0.25 packs/day for 2 years    Types: Cigarettes    Quit date: 05/02/2013  . Smokeless tobacco: Not on file  . Alcohol Use: No     Comment: socially    Allergies:  Allergies  Allergen Reactions  . Shellfish Allergy Shortness Of Breath and Swelling    Prescriptions prior to admission  Medication Sig Dispense Refill Last Dose  . Prenatal Vit-Fe Fumarate-FA (PRENATAL COMPLETE) 14-0.4 MG TABS Take 1 tablet by mouth daily. 30 each 2 Past Week at Unknown time  . promethazine (PHENERGAN) 25 MG tablet Take 1 tablet (25 mg total) by mouth every 6 (six) hours as needed for nausea or vomiting. (Patient taking differently: Take 25 mg by mouth every 6 (six) hours as needed for nausea or vomiting. Patient is using Phenergan 25 mg tablet vaginally as prescribed orally.) 30 tablet 2 06/21/2014 at  Unknown time  . albuterol (PROVENTIL HFA;VENTOLIN HFA) 108 (90 BASE) MCG/ACT inhaler Inhale 2 puffs into the lungs every 6 (six) hours as needed for wheezing or shortness of breath.   Past Week at Unknown time  . metoCLOPramide (REGLAN) 10 MG tablet Take 1 tablet (10 mg total) by mouth 3 (three) times daily before meals. (Patient not taking: Reported on 06/22/2014) 30 tablet 6 Not Taking at Unknown time  . oxyCODONE-acetaminophen (PERCOCET/ROXICET) 5-325 MG per tablet Take 2 tablets by mouth every 4 (four) hours as needed for moderate pain or severe pain. (Patient not taking: Reported on 06/22/2014) 10 tablet 0 Not Taking at Unknown time    Review of Systems  Constitutional: Negative.   HENT: Negative.   Respiratory: Negative.   Cardiovascular: Negative.   Gastrointestinal: Positive for nausea, vomiting and abdominal pain.  Musculoskeletal: Positive for back pain.       Right CVA tenderness  Skin: Negative.   Neurological: Negative.   Psychiatric/Behavioral: Negative.    Physical Exam   Blood pressure 134/59, pulse 90, temperature 99.3 F (37.4 C), temperature source Oral, resp. rate 18, height 5' (1.524 m), weight 72.576 kg (160 lb), last menstrual period 04/03/2014.  Physical Exam  Constitutional: She is oriented to person, place, and time. She appears  well-developed and well-nourished. No distress.  HENT:  Head: Normocephalic and atraumatic.  Genitourinary:  Genital:external negative Vaginal:small amount white discharge Cervix:closed/ thick Bimanual: nontender/ gravid   Neurological: She is alert and oriented to person, place, and time.  Skin: Skin is warm and dry.  Psychiatric: She has a normal mood and affect. Her behavior is normal. Judgment and thought content normal.   Results for orders placed or performed during the hospital encounter of 06/22/14 (from the past 24 hour(s))  Urinalysis, Routine w reflex microscopic     Status: Abnormal   Collection Time: 06/22/14  8:15  AM  Result Value Ref Range   Color, Urine YELLOW YELLOW   APPearance CLEAR CLEAR   Specific Gravity, Urine 1.015 1.005 - 1.030   pH 8.5 (H) 5.0 - 8.0   Glucose, UA NEGATIVE NEGATIVE mg/dL   Hgb urine dipstick TRACE (A) NEGATIVE   Bilirubin Urine NEGATIVE NEGATIVE   Ketones, ur NEGATIVE NEGATIVE mg/dL   Protein, ur NEGATIVE NEGATIVE mg/dL   Urobilinogen, UA 2.0 (H) 0.0 - 1.0 mg/dL   Nitrite NEGATIVE NEGATIVE   Leukocytes, UA MODERATE (A) NEGATIVE  Urine microscopic-add on     Status: Abnormal   Collection Time: 06/22/14  8:15 AM  Result Value Ref Range   Squamous Epithelial / LPF RARE RARE   WBC, UA 7-10 <3 WBC/hpf   Bacteria, UA FEW (A) RARE  Wet prep, genital     Status: Abnormal   Collection Time: 06/22/14  8:57 AM  Result Value Ref Range   Yeast Wet Prep HPF POC NONE SEEN NONE SEEN   Trich, Wet Prep NONE SEEN NONE SEEN   Clue Cells Wet Prep HPF POC FEW (A) NONE SEEN   WBC, Wet Prep HPF POC MODERATE (A) NONE SEEN  CBC     Status: Abnormal   Collection Time: 06/22/14  9:07 AM  Result Value Ref Range   WBC 10.9 (H) 4.0 - 10.5 K/uL   RBC 4.11 3.87 - 5.11 MIL/uL   Hemoglobin 13.0 12.0 - 15.0 g/dL   HCT 36.1 36.0 - 46.0 %   MCV 87.8 78.0 - 100.0 fL   MCH 31.6 26.0 - 34.0 pg   MCHC 36.0 30.0 - 36.0 g/dL   RDW 13.3 11.5 - 15.5 %   Platelets 251 150 - 400 K/uL  Comprehensive metabolic panel     Status: Abnormal   Collection Time: 06/22/14  9:07 AM  Result Value Ref Range   Sodium 136 135 - 145 mmol/L   Potassium 3.4 (L) 3.5 - 5.1 mmol/L   Chloride 107 96 - 112 mEq/L   CO2 23 19 - 32 mmol/L   Glucose, Bld 92 70 - 99 mg/dL   BUN 6 6 - 23 mg/dL   Creatinine, Ser 0.47 (L) 0.50 - 1.10 mg/dL   Calcium 9.1 8.4 - 10.5 mg/dL   Total Protein 7.1 6.0 - 8.3 g/dL   Albumin 3.8 3.5 - 5.2 g/dL   AST 22 0 - 37 U/L   ALT 40 (H) 0 - 35 U/L   Alkaline Phosphatase 71 39 - 117 U/L   Total Bilirubin 1.1 0.3 - 1.2 mg/dL   GFR calc non Af Amer >90 >90 mL/min   GFR calc Af Amer >90 >90  mL/min   Anion gap 6 5 - 15  . Marland Kitchen MAU Course  Procedures  MDM Wet prep, GC, Chlamydia, HIV, CBC, CMET, urine culture Percocet/ phenergan  Assessment and Plan   A: UTI Nausea and  vomiting in pregnancy  P: Keflex 500 mg PO QID x 7 days Phenergan 25 mg po q8 hours Advised to increase fluids and eat small meals Establish [facility] asap Return to MAU as needed  Georgia Duff 06/22/2014, 10:55 AM

## 2014-06-22 NOTE — MAU Note (Signed)
Couldn't sleep last night.  Cramping on rt side of abd.starting to go into low back

## 2014-06-23 LAB — GC/CHLAMYDIA PROBE AMP
CT Probe RNA: NEGATIVE
GC Probe RNA: NEGATIVE

## 2014-06-25 LAB — CULTURE, OB URINE: SPECIAL REQUESTS: NORMAL

## 2014-06-27 ENCOUNTER — Inpatient Hospital Stay (HOSPITAL_COMMUNITY)
Admission: AD | Admit: 2014-06-27 | Discharge: 2014-06-27 | Disposition: A | Discharge status: Home / Self Care | Payer: Medicaid Other | Source: Ambulatory Visit | Attending: Obstetrics & Gynecology | Admitting: Obstetrics & Gynecology

## 2014-06-27 ENCOUNTER — Inpatient Hospital Stay (HOSPITAL_COMMUNITY): Payer: Medicaid Other

## 2014-06-27 ENCOUNTER — Encounter (HOSPITAL_COMMUNITY): Payer: Self-pay | Admitting: *Deleted

## 2014-06-27 DIAGNOSIS — Z87891 Personal history of nicotine dependence: Secondary | ICD-10-CM | POA: Insufficient documentation

## 2014-06-27 DIAGNOSIS — O98311 Other infections with a predominantly sexual mode of transmission complicating pregnancy, first trimester: Secondary | ICD-10-CM | POA: Diagnosis not present

## 2014-06-27 DIAGNOSIS — O26899 Other specified pregnancy related conditions, unspecified trimester: Secondary | ICD-10-CM

## 2014-06-27 DIAGNOSIS — O9989 Other specified diseases and conditions complicating pregnancy, childbirth and the puerperium: Secondary | ICD-10-CM

## 2014-06-27 DIAGNOSIS — A6000 Herpesviral infection of urogenital system, unspecified: Secondary | ICD-10-CM | POA: Insufficient documentation

## 2014-06-27 DIAGNOSIS — R109 Unspecified abdominal pain: Secondary | ICD-10-CM | POA: Diagnosis not present

## 2014-06-27 DIAGNOSIS — O26851 Spotting complicating pregnancy, first trimester: Secondary | ICD-10-CM

## 2014-06-27 LAB — URINALYSIS, ROUTINE W REFLEX MICROSCOPIC
Bilirubin Urine: NEGATIVE
GLUCOSE, UA: NEGATIVE mg/dL
Ketones, ur: NEGATIVE mg/dL
LEUKOCYTES UA: NEGATIVE
Nitrite: NEGATIVE
PROTEIN: NEGATIVE mg/dL
SPECIFIC GRAVITY, URINE: 1.015 (ref 1.005–1.030)
Urobilinogen, UA: 0.2 mg/dL (ref 0.0–1.0)
pH: 7 (ref 5.0–8.0)

## 2014-06-27 LAB — URINE MICROSCOPIC-ADD ON

## 2014-06-27 LAB — POCT PREGNANCY, URINE: PREG TEST UR: POSITIVE — AB

## 2014-06-27 NOTE — Discharge Instructions (Signed)
First Trimester of Pregnancy The first trimester of pregnancy is from week 1 until the end of week 12 (months 1 through 3). During this time, your baby will begin to develop inside you. At 6-8 weeks, the eyes and face are formed, and the heartbeat can be seen on ultrasound. At the end of 12 weeks, all the baby's organs are formed. Prenatal care is all the medical care you receive before the birth of your baby. Make sure you get good prenatal care and follow all of your doctor's instructions. HOME CARE  Medicines  Take medicine only as told by your doctor. Some medicines are safe and some are not during pregnancy.  Take your prenatal vitamins as told by your doctor.  Take medicine that helps you poop (stool softener) as needed if your doctor says it is okay. Diet  Eat regular, healthy meals.  Your doctor will tell you the amount of weight gain that is right for you.  Avoid raw meat and uncooked cheese.  If you feel sick to your stomach (nauseous) or throw up (vomit):  Eat 4 or 5 small meals a day instead of 3 large meals.  Try eating a few soda crackers.  Drink liquids between meals instead of during meals.  If you have a hard time pooping (constipation):  Eat high-fiber foods like fresh vegetables, fruit, and whole grains.  Drink enough fluids to keep your pee (urine) clear or pale yellow. Activity and Exercise  Exercise only as told by your doctor. Stop exercising if you have cramps or pain in your lower belly (abdomen) or low back.  Try to avoid standing for long periods of time. Move your legs often if you must stand in one place for a long time.  Avoid heavy lifting.  Wear low-heeled shoes. Sit and stand up straight.  You can have sex unless your doctor tells you not to. Relief of Pain or Discomfort  Wear a good support bra if your breasts are sore.  Take warm water baths (sitz baths) to soothe pain or discomfort caused by hemorrhoids. Use hemorrhoid cream if your  doctor says it is okay.  Rest with your legs raised if you have leg cramps or low back pain.  Wear support hose if you have puffy, bulging veins (varicose veins) in your legs. Raise (elevate) your feet for 15 minutes, 3-4 times a day. Limit salt in your diet. Prenatal Care  Schedule your prenatal visits by the twelfth week of pregnancy.  Write down your questions. Take them to your prenatal visits.  Keep all your prenatal visits as told by your doctor. Safety  Wear your seat belt at all times when driving.  Make a list of emergency phone numbers. The list should include numbers for family, friends, the hospital, and police and fire departments. General Tips  Ask your doctor for a referral to a local prenatal class. Begin classes no later than at the start of month 6 of your pregnancy.  Ask for help if you need counseling or help with nutrition. Your doctor can give you advice or tell you where to go for help.  Do not use hot tubs, steam rooms, or saunas.  Do not douche or use tampons or scented sanitary pads.  Do not cross your legs for long periods of time.  Avoid litter boxes and soil used by cats.  Avoid all smoking, herbs, and alcohol. Avoid drugs not approved by your doctor.  Visit your dentist. At home, brush your teeth   with a soft toothbrush. Be gentle when you floss. GET HELP IF:  You are dizzy.  You have mild cramps or pressure in your lower belly.  You have a nagging pain in your belly area.  You continue to feel sick to your stomach, throw up, or have watery poop (diarrhea).  You have a bad smelling fluid coming from your vagina.  You have pain with peeing (urination).  You have increased puffiness (swelling) in your face, hands, legs, or ankles. GET HELP RIGHT AWAY IF:   You have a fever.  You are leaking fluid from your vagina.  You have spotting or bleeding from your vagina.  You have very bad belly cramping or pain.  You gain or lose weight  rapidly.  You throw up blood. It may look like coffee grounds.  You are around people who have German measles, fifth disease, or chickenpox.  You have a very bad headache.  You have shortness of breath.  You have any kind of trauma, such as from a fall or a car accident. Document Released: 12/03/2007 Document Revised: 10/31/2013 Document Reviewed: 04/26/2013 ExitCare Patient Information 2015 [employer]. This information is not intended to replace advice given to you by your health care provider. Make sure you discuss any questions you have with your health care provider.  

## 2014-06-27 NOTE — MAU Provider Note (Signed)
History     CSN: [id number]  Arrival date and time: 06/27/14 6256   First Provider Initiated Contact with Patient 06/27/14 0250      Chief Complaint  Patient presents with  . Abdominal Cramping  . Vaginal Bleeding   HPI  [name] is a 23 y.o. G3P2002 at [redacted]w[redacted]d who presents to MAU today with complaint of spotting and lower abdominal pain. Patient was seen in MAU 05/31/14 and had [place] that showed IUGS and YS only. Patient states she has noted increased pain and pressure in the lower abdomen today. She rates pain at 7/10 now. She states that she has also had spotting off and on with wiping x weeks. She denies N/V or fever. She states she had one episode of loose stools today. She was diagnosed with UTI at last visit and did not take Keflex. She denies UTI symptoms today. Last intercourse was 3 days ago.   OB History    Gravida Para Term Preterm AB TAB SAB Ectopic Multiple Living   3 2 2  0 0 0 0 0 0 2      Past Medical History  Diagnosis Date  . Asthma   . Herpes simplex of female genitalia     last outbreak 4 months ago  . Migraines   . Vaginal Pap smear, abnormal     Past Surgical History  Procedure Laterality Date  . Colposcopy  03/2014    Family History  Problem Relation Age of Onset  . Asthma Mother   . Asthma Sister   . Asthma Sister     History  Substance Use Topics  . Smoking status: Former Smoker -- 0.25 packs/day for 2 years    Types: Cigarettes    Quit date: 05/02/2013  . Smokeless tobacco: Not on file  . Alcohol Use: No     Comment: socially    Allergies:  Allergies  Allergen Reactions  . Shellfish Allergy Shortness Of Breath and Swelling    Prescriptions prior to admission  Medication Sig Dispense Refill Last Dose  . albuterol (PROVENTIL HFA;VENTOLIN HFA) 108 (90 BASE) MCG/ACT inhaler Inhale 2 puffs into the lungs every 6 (six) hours as needed for wheezing or shortness of breath.   Past Week at Unknown time  . cephALEXin (KEFLEX) 500 MG  capsule Take 1 capsule (500 mg total) by mouth 4 (four) times daily. 28 capsule 0   . Prenatal Vit-Fe Fumarate-FA (PRENATAL COMPLETE) 14-0.4 MG TABS Take 1 tablet by mouth daily. 30 each 2 Past Week at Unknown time  . promethazine (PHENERGAN) 25 MG tablet Take 1 tablet (25 mg total) by mouth every 6 (six) hours as needed for nausea or vomiting. 30 tablet 1     Review of Systems  Constitutional: Negative for fever and malaise/fatigue.  Gastrointestinal: Positive for abdominal pain and diarrhea. Negative for nausea, vomiting and constipation.  Genitourinary: Negative for dysuria, urgency and frequency.       + vaginal bleeding Neg - vaginal discharge   Physical Exam   Blood pressure 117/61, pulse 68, temperature 98 F (36.7 C), temperature source Oral, resp. rate 18, last menstrual period 04/03/2014.  Physical Exam  Constitutional: She is oriented to person, place, and time. She appears well-developed and well-nourished. No distress.  HENT:  Head: Normocephalic.  Cardiovascular: Normal rate.   Respiratory: Effort normal.  GI: Soft. She exhibits no distension and no mass. There is tenderness (mild tenderness to palpation of the suprapubic region). There is no rebound and  no guarding.  Neurological: She is alert and oriented to person, place, and time.  Skin: Skin is warm and dry. No erythema.  Psychiatric: She has a normal mood and affect.   Results for orders placed or performed during the hospital encounter of 06/27/14 (from the past 24 hour(s))  Urinalysis, Routine w reflex microscopic     Status: Abnormal   Collection Time: 06/27/14  2:40 AM  Result Value Ref Range   Color, Urine YELLOW YELLOW   APPearance CLEAR CLEAR   Specific Gravity, Urine 1.015 1.005 - 1.030   pH 7.0 5.0 - 8.0   Glucose, UA NEGATIVE NEGATIVE mg/dL   Hgb urine dipstick SMALL (A) NEGATIVE   Bilirubin Urine NEGATIVE NEGATIVE   Ketones, ur NEGATIVE NEGATIVE mg/dL   Protein, ur NEGATIVE NEGATIVE mg/dL    Urobilinogen, UA 0.2 0.0 - 1.0 mg/dL   Nitrite NEGATIVE NEGATIVE   Leukocytes, UA NEGATIVE NEGATIVE  Urine microscopic-add on     Status: Abnormal   Collection Time: 06/27/14  2:40 AM  Result Value Ref Range   Squamous Epithelial / LPF FEW (A) RARE   WBC, UA 0-2 <3 WBC/hpf   RBC / HPF 0-2 <3 RBC/hpf   Bacteria, UA RARE RARE  Pregnancy, urine POC     Status: Abnormal   Collection Time: 06/27/14  2:54 AM  Result Value Ref Range   Preg Test, Ur POSITIVE (A) NEGATIVE   US Ob Comp Less 14 Wks  06/27/2014   CLINICAL DATA:  Spotting and pain in first trimester pregnancy  EXAM: OBSTETRIC <14 WK ULTRASOUND  TECHNIQUE: Transabdominal ultrasound was performed for evaluation of the gestation as well as the maternal uterus and adnexal regions.  COMPARISON:  08/01/2013  FINDINGS: Intrauterine gestational sac: Visualized/normal in shape. No subchronic hemorrhage.  Yolk sac:  Present  Embryo:  Present  Cardiac Activity: Present  Heart Rate: 172 bpm  CRL:   25.3  mm   9 w 3 d                  [place] EDC: 01/27/2015  Maternal uterus/adnexae: The ovaries are symmetric in size and normal in appearance. No free pelvic fluid.  IMPRESSION: Single living intrauterine gestation with normal growth since 05/31/2014. No pathologic findings.   Electronically Signed   By: Jorje Guild M.D.   On: 06/27/2014 04:21    MAU Course  Procedures None  MDM UA and [place] today  Assessment and Plan  A: SIUP at [redacted]w[redacted]d Abdominal pain in pregnancy, first trimester Spotting in pregnancy, first trimester  P: Discharge home Advised Tylenol PRN for pain Bleeding and first trimester warning signs discussed Patient advised to start prenatal care with provider of choice. Plans to go to [facility] when [facility] is approved Patient may return to MAU as needed or if her condition were to change or worsen    Luvenia Redden, PA-C  06/27/2014, 4:27 AM

## 2014-06-27 NOTE — MAU Note (Signed)
Pt states that she began bleeding and cramping this morning and it has been off and on all day. Pt c/o lower perineal pressure as well.

## 2014-06-30 NOTE — L&D Delivery Note (Signed)
Delivery Note Patient pushed well with two contractions. At 4:54 PM a viable female was delivered via Vaginal, Spontaneous Delivery (Presentation: Occiput ; Anterior ).  APGAR: 9, 9; weight  .   Placenta status: Intact, Spontaneous.  Cord: 3 vessels with the following complications: None.  Cord pH: n/a  Anesthesia: Epidural  Episiotomy: None Lacerations: None Suture Repair: n/a Est. Blood Loss (mL): 487  Mom to postpartum.  Baby to Couplet care / Skin to Skin.  [place] 01/03/2015, 5:35 PM

## 2014-07-04 ENCOUNTER — Inpatient Hospital Stay (HOSPITAL_COMMUNITY)
Admission: AD | Admit: 2014-07-04 | Discharge: 2014-07-05 | Disposition: A | Discharge status: Home / Self Care | Payer: Self-pay | Source: Ambulatory Visit | Attending: Obstetrics & Gynecology | Admitting: Obstetrics & Gynecology

## 2014-07-04 ENCOUNTER — Encounter (HOSPITAL_COMMUNITY): Payer: Self-pay | Admitting: *Deleted

## 2014-07-04 DIAGNOSIS — Z5320 Procedure and treatment not carried out because of patient's decision for unspecified reasons: Secondary | ICD-10-CM | POA: Insufficient documentation

## 2014-07-04 NOTE — MAU Note (Addendum)
PT  SAYS SHE STARTED ON/OFF  SINCE  BEGIN OF PREG   THEN YESTERDAY  WENT  TO B-ROOM-  WHEN  SHE WIPED   - SAW BLOOD  AND BLOOD CLOT- .  DR Jenne Pane   AND [facility] - [facility]   DEL  OTHER  BABIES.    NO  DR  NOW.       TODAY  MORE  BLEEDING.    IN  TRIAGE -  NO PAD -  NO BLOOD IN   CLOTHES.    WAS IN   MAU-   1-2 WEEKS  AGO   FOR  BLEEDING.Marland Kitchen  LAST  SEX- 3 WEEEKS

## 2014-07-05 NOTE — MAU Note (Signed)
Pt informed registration staff that she needs to leave due to family emergency, then walked out.

## 2014-07-11 ENCOUNTER — Encounter: Payer: Self-pay | Admitting: General Practice

## 2014-07-20 ENCOUNTER — Other Ambulatory Visit: Payer: Self-pay | Admitting: Obstetrics and Gynecology

## 2014-07-20 LAB — OB RESULTS CONSOLE RPR: RPR: NONREACTIVE

## 2014-07-20 LAB — OB RESULTS CONSOLE HEPATITIS B SURFACE ANTIGEN: HEP B S AG: NEGATIVE

## 2014-07-20 LAB — OB RESULTS CONSOLE GC/CHLAMYDIA
Chlamydia: NEGATIVE
Gonorrhea: NEGATIVE

## 2014-07-20 LAB — OB RESULTS CONSOLE RUBELLA ANTIBODY, IGM: Rubella: IMMUNE

## 2014-07-24 LAB — CYTOLOGY - PAP

## 2014-08-01 ENCOUNTER — Encounter (HOSPITAL_COMMUNITY): Payer: Self-pay | Admitting: *Deleted

## 2014-08-01 ENCOUNTER — Inpatient Hospital Stay (HOSPITAL_COMMUNITY)
Admission: AD | Admit: 2014-08-01 | Discharge: 2014-08-02 | Disposition: A | Discharge status: Home / Self Care | Payer: Medicaid Other | Source: Ambulatory Visit | Attending: Obstetrics and Gynecology | Admitting: Obstetrics and Gynecology

## 2014-08-01 DIAGNOSIS — G43009 Migraine without aura, not intractable, without status migrainosus: Secondary | ICD-10-CM

## 2014-08-01 DIAGNOSIS — Z3A14 14 weeks gestation of pregnancy: Secondary | ICD-10-CM | POA: Insufficient documentation

## 2014-08-01 DIAGNOSIS — G43909 Migraine, unspecified, not intractable, without status migrainosus: Secondary | ICD-10-CM | POA: Diagnosis not present

## 2014-08-01 DIAGNOSIS — O9989 Other specified diseases and conditions complicating pregnancy, childbirth and the puerperium: Secondary | ICD-10-CM | POA: Insufficient documentation

## 2014-08-01 DIAGNOSIS — Z87891 Personal history of nicotine dependence: Secondary | ICD-10-CM | POA: Diagnosis not present

## 2014-08-01 DIAGNOSIS — R51 Headache: Secondary | ICD-10-CM | POA: Diagnosis present

## 2014-08-01 LAB — URINALYSIS, ROUTINE W REFLEX MICROSCOPIC
Bilirubin Urine: NEGATIVE
Glucose, UA: NEGATIVE mg/dL
Ketones, ur: 15 mg/dL — AB
Leukocytes, UA: NEGATIVE
Nitrite: NEGATIVE
PH: 6.5 (ref 5.0–8.0)
Protein, ur: NEGATIVE mg/dL
Specific Gravity, Urine: 1.02 (ref 1.005–1.030)
Urobilinogen, UA: 1 mg/dL (ref 0.0–1.0)

## 2014-08-01 LAB — URINE MICROSCOPIC-ADD ON

## 2014-08-01 LAB — POCT PREGNANCY, URINE: PREG TEST UR: POSITIVE — AB

## 2014-08-01 MED ORDER — SODIUM CHLORIDE 0.9 % IV SOLN
INTRAVENOUS | Status: DC
  Administered 2014-08-01: 23:00:00 via INTRAVENOUS

## 2014-08-01 MED ORDER — METOCLOPRAMIDE HCL 5 MG/ML IJ SOLN
10.0000 mg | Freq: Once | INTRAMUSCULAR | Status: AC
  Administered 2014-08-01: 10 mg via INTRAVENOUS
  Filled 2014-08-01: qty 2

## 2014-08-01 MED ORDER — BUTALBITAL-APAP-CAFFEINE 50-325-40 MG PO TABS
2.0000 | ORAL_TABLET | Freq: Once | ORAL | Status: AC
  Administered 2014-08-01: 2 via ORAL
  Filled 2014-08-01: qty 2

## 2014-08-01 MED ORDER — DEXAMETHASONE SODIUM PHOSPHATE 10 MG/ML IJ SOLN
10.0000 mg | Freq: Once | INTRAMUSCULAR | Status: AC
  Administered 2014-08-01: 10 mg via INTRAVENOUS
  Filled 2014-08-01: qty 1

## 2014-08-01 MED ORDER — DIPHENHYDRAMINE HCL 50 MG/ML IJ SOLN
25.0000 mg | Freq: Once | INTRAMUSCULAR | Status: AC
  Administered 2014-08-01: 25 mg via INTRAVENOUS
  Filled 2014-08-01: qty 1

## 2014-08-01 NOTE — MAU Note (Signed)
Pt reports she has had a headache for the last 3 days, all day long. Has taken tylenol without relief. Nausea and vomiting today.

## 2014-08-01 NOTE — MAU Provider Note (Signed)
History     CSN: [id number]  Arrival date and time: 08/01/14 2048   First Provider Initiated Contact with Patient 08/01/14 2129      Chief Complaint  Patient presents with  . Headache   HPI  [name] is a 24 y.o. G3P2002 at [redacted]w[redacted]d who presents to MAU today with complaint of headache x 3 days. She states that she has tried Tylenol without relief and symptoms are worsening. She endorses associated photophobia and N/V once. She denies other N/V recently in her pregnancy. She denies abdominal pain or vaginal bleeding today.   OB History    Gravida Para Term Preterm AB TAB SAB Ectopic Multiple Living   3 2 2  0 0 0 0 0 0 2      Past Medical History  Diagnosis Date  . Asthma   . Herpes simplex of female genitalia     last outbreak 4 months ago  . Migraines   . Vaginal Pap smear, abnormal     Past Surgical History  Procedure Laterality Date  . Colposcopy  03/2014    Family History  Problem Relation Age of Onset  . Asthma Mother   . Asthma Sister   . Asthma Sister     History  Substance Use Topics  . Smoking status: Former Smoker -- 0.25 packs/day for 2 years    Types: Cigarettes    Quit date: 05/02/2013  . Smokeless tobacco: Never Used  . Alcohol Use: No     Comment: socially    Allergies:  Allergies  Allergen Reactions  . Shellfish Allergy Shortness Of Breath and Swelling    No prescriptions prior to admission    Review of Systems  Constitutional: Negative for fever and malaise/fatigue.  Eyes: Positive for blurred vision.  Gastrointestinal: Positive for nausea and vomiting. Negative for abdominal pain.  Genitourinary: Negative for dysuria, urgency and frequency.       Neg - vaginal bleeding  Neurological: Positive for dizziness and headaches. Negative for loss of consciousness.   Physical Exam   Blood pressure 108/56, pulse 74, temperature 98 F (36.7 C), temperature source Oral, resp. rate 18, height 5\' 1"  (1.549 m), weight 160 lb (72.576  kg), last menstrual period 04/03/2014, SpO2 100 %.  Physical Exam  Constitutional: She is oriented to person, place, and time. She appears well-developed and well-nourished. No distress.  HENT:  Head: Normocephalic.  Cardiovascular: Normal rate.   Respiratory: Effort normal.  Neurological: She is alert and oriented to person, place, and time.  Skin: Skin is warm and dry. No erythema.  Psychiatric: She has a normal mood and affect.   Results for orders placed or performed during the hospital encounter of 08/01/14 (from the past 24 hour(s))  Urinalysis, Routine w reflex microscopic     Status: Abnormal   Collection Time: 08/01/14  9:00 PM  Result Value Ref Range   Color, Urine YELLOW YELLOW   APPearance CLEAR CLEAR   Specific Gravity, Urine 1.020 1.005 - 1.030   pH 6.5 5.0 - 8.0   Glucose, UA NEGATIVE NEGATIVE mg/dL   Hgb urine dipstick SMALL (A) NEGATIVE   Bilirubin Urine NEGATIVE NEGATIVE   Ketones, ur 15 (A) NEGATIVE mg/dL   Protein, ur NEGATIVE NEGATIVE mg/dL   Urobilinogen, UA 1.0 0.0 - 1.0 mg/dL   Nitrite NEGATIVE NEGATIVE   Leukocytes, UA NEGATIVE NEGATIVE  Urine microscopic-add on     Status: None   Collection Time: 08/01/14  9:00 PM  Result Value Ref  Range   Squamous Epithelial / LPF RARE RARE   WBC, UA 0-2 <3 WBC/hpf   RBC / HPF 0-2 <3 RBC/hpf   Bacteria, UA RARE RARE  Pregnancy, urine POC     Status: Abnormal   Collection Time: 08/01/14  9:53 PM  Result Value Ref Range   Preg Test, Ur POSITIVE (A) NEGATIVE    MAU Course  Procedures None  MDM FHR - 152 bpm with doppler 2 Fioricet given 35 minutes after Fioricet given, patient continues to complain of pain. She is crying and states no improvement.  IV NS, 25 mg Benadryl, 10 mg Decadron and 10 mg Reglan given.  Discussed with Dr. Philis Pique. Agrees with treatment plan. IV fluids and medications complete - patient states headache has resolved  Assessment and Plan  A: SIUP at [redacted]w[redacted]d Migraine  headache  P: Discharge home Rx for Fioricet given to patient Advised increased PO hydration as tolerated Patient encouraged to follow-up as scheduled with [facility] Patient may return to MAU as needed or if her condition were to change or worsen   Luvenia Redden, PA-C  08/02/2014, 12:33 AM

## 2014-08-02 ENCOUNTER — Encounter (HOSPITAL_COMMUNITY): Payer: Self-pay | Admitting: *Deleted

## 2014-08-02 ENCOUNTER — Inpatient Hospital Stay (EMERGENCY_DEPARTMENT_HOSPITAL)
Admission: AD | Admit: 2014-08-02 | Discharge: 2014-08-02 | Disposition: A | Discharge status: Home / Self Care | Payer: Medicaid Other | Source: Ambulatory Visit | Attending: Obstetrics and Gynecology | Admitting: Obstetrics and Gynecology

## 2014-08-02 ENCOUNTER — Ambulatory Visit: Payer: Self-pay | Admitting: Obstetrics & Gynecology

## 2014-08-02 DIAGNOSIS — Z3A14 14 weeks gestation of pregnancy: Secondary | ICD-10-CM

## 2014-08-02 DIAGNOSIS — R202 Paresthesia of skin: Secondary | ICD-10-CM | POA: Insufficient documentation

## 2014-08-02 DIAGNOSIS — O26899 Other specified pregnancy related conditions, unspecified trimester: Secondary | ICD-10-CM

## 2014-08-02 DIAGNOSIS — O99352 Diseases of the nervous system complicating pregnancy, second trimester: Secondary | ICD-10-CM

## 2014-08-02 DIAGNOSIS — Z87891 Personal history of nicotine dependence: Secondary | ICD-10-CM

## 2014-08-02 DIAGNOSIS — G5600 Carpal tunnel syndrome, unspecified upper limb: Secondary | ICD-10-CM

## 2014-08-02 DIAGNOSIS — O9989 Other specified diseases and conditions complicating pregnancy, childbirth and the puerperium: Secondary | ICD-10-CM | POA: Insufficient documentation

## 2014-08-02 LAB — CBC
HCT: 30.3 % — ABNORMAL LOW (ref 36.0–46.0)
HEMOGLOBIN: 11 g/dL — AB (ref 12.0–15.0)
MCH: 32.2 pg (ref 26.0–34.0)
MCHC: 36.3 g/dL — AB (ref 30.0–36.0)
MCV: 88.6 fL (ref 78.0–100.0)
PLATELETS: 229 10*3/uL (ref 150–400)
RBC: 3.42 MIL/uL — ABNORMAL LOW (ref 3.87–5.11)
RDW: 14 % (ref 11.5–15.5)
WBC: 12.5 10*3/uL — ABNORMAL HIGH (ref 4.0–10.5)

## 2014-08-02 LAB — COMPREHENSIVE METABOLIC PANEL
ALK PHOS: 55 U/L (ref 39–117)
ALT: 18 U/L (ref 0–35)
AST: 16 U/L (ref 0–37)
Albumin: 3.7 g/dL (ref 3.5–5.2)
Anion gap: 6 (ref 5–15)
CO2: 20 mmol/L (ref 19–32)
CREATININE: 0.42 mg/dL — AB (ref 0.50–1.10)
Calcium: 8.6 mg/dL (ref 8.4–10.5)
Chloride: 109 mmol/L (ref 96–112)
GFR calc non Af Amer: >90 mL/min (ref >90)
Glucose, Bld: 90 mg/dL (ref 70–99)
POTASSIUM: 3.3 mmol/L — AB (ref 3.5–5.1)
Sodium: 135 mmol/L (ref 135–145)
Total Bilirubin: 0.9 mg/dL (ref 0.3–1.2)
Total Protein: 6.8 g/dL (ref 6.0–8.3)

## 2014-08-02 MED ORDER — BUTALBITAL-APAP-CAFFEINE 50-325-40 MG PO TABS: 1.0000 | ORAL_TABLET | Freq: Four times a day (QID) | ORAL | Status: DC | PRN

## 2014-08-02 MED ORDER — ACETAMINOPHEN 325 MG PO TABS
650.0000 mg | ORAL_TABLET | Freq: Once | ORAL | Status: AC
  Administered 2014-08-02: 650 mg via ORAL
  Filled 2014-08-02: qty 2

## 2014-08-02 NOTE — MAU Note (Addendum)
Came in last night because she had a really bad migraine.  Since she woke up this morning her entire body has felt numb, pins and needles all over.  HA is gone

## 2014-08-02 NOTE — MAU Note (Signed)
Patient was given rx for Fioricet, but is concerned that the way she is feeling is an allergic reaction to the dose she was given last night at 2200. Did not start to have numbness and tingling until around 0400.

## 2014-08-02 NOTE — MAU Note (Signed)
Urine in lab 

## 2014-08-02 NOTE — Discharge Instructions (Signed)

## 2014-08-02 NOTE — MAU Provider Note (Signed)
Chief Complaint: No chief complaint on file.   First Provider Initiated Contact with Patient 08/02/14 1251     SUBJECTIVE HPI: Katrina [name] is a 24 y.o. G3P2002 at [redacted]w[redacted]d by LMP who presents to maternity admissions reporting tingling sensation and numbness of her entire body with onset at 4 pm. She took [place] in MAU last night at 10 pm and is concerned she is having an allergic reaction.  She denies any feelings of numbness in her mouth or tongue and denies shortness of breath.  She denies hives or itching.   She denies abdominal cramping, vaginal bleeding, vaginal itching/burning, urinary symptoms, h/a, dizziness, n/v, or fever/chills.    Past Medical History  Diagnosis Date  . Asthma   . Herpes simplex of female genitalia     last outbreak 4 months ago  . Migraines   . Vaginal Pap smear, abnormal    Past Surgical History  Procedure Laterality Date  . Colposcopy  03/2014   History   Social History  . Marital Status: Single    Spouse Name: N/A    Number of Children: N/A  . Years of Education: N/A   Occupational History  . Not on file.   Social History Main Topics  . Smoking status: Former Smoker -- 0.25 packs/day for 2 years    Types: Cigarettes    Quit date: 05/02/2013  . Smokeless tobacco: Never Used  . Alcohol Use: No     Comment: socially  . Drug Use: No  . Sexual Activity: Yes    Birth Control/ Protection: None   Other Topics Concern  . Not on file   Social History Narrative   No current facility-administered medications on file prior to encounter.   Current Outpatient Prescriptions on File Prior to Encounter  Medication Sig Dispense Refill  . acetaminophen (TYLENOL) 325 MG tablet Take 975 mg by mouth every 6 (six) hours as needed for headache.    . albuterol (PROVENTIL HFA;VENTOLIN HFA) 108 (90 BASE) MCG/ACT inhaler Inhale 2 puffs into the lungs every 6 (six) hours as needed for wheezing or shortness of breath.    . butalbital-acetaminophen-caffeine  (FIORICET) 50-325-40 MG per tablet Take 1-2 tablets by mouth every 6 (six) hours as needed for headache. 20 tablet 0  . Prenatal Vit-Fe Fumarate-FA (PRENATAL COMPLETE) 14-0.4 MG TABS Take 1 tablet by mouth daily. 30 each 2  . promethazine (PHENERGAN) 25 MG tablet Take 1 tablet (25 mg total) by mouth every 6 (six) hours as needed for nausea or vomiting. 30 tablet 1   Allergies  Allergen Reactions  . Shellfish Allergy Shortness Of Breath and Swelling    ROS: Pertinent items in HPI  OBJECTIVE Blood pressure 101/53, pulse 85, temperature 99.4 F (37.4 C), resp. rate 18, height 5\' 1"  (1.549 m), weight 72.122 kg (159 lb), last menstrual period 04/03/2014, SpO2 100 %. GENERAL: Well-developed, well-nourished female in no acute distress.  HEENT: Normocephalic HEART: normal rate, heart sounds, regular rhythm RESP: normal effort, lung sounds clear and equal bilaterally ABDOMEN: Soft, non-tender EXTREMITIES: Nontender, no edema Physical Examination: Neurological - alert, oriented, normal speech, no focal findings or movement disorder noted, screening mental status exam normal, cranial nerves II through XII intact, DTR's normal and symmetric, motor and sensory grossly normal bilaterally, normal muscle tone, no tremors, strength 5/5  FHT present by doppler   LAB RESULTS Results for orders placed or performed during the hospital encounter of 08/02/14 (from the past 24 hour(s))  CBC  Status: Abnormal   Collection Time: 08/02/14  1:15 PM  Result Value Ref Range   WBC 12.5 (H) 4.0 - 10.5 K/uL   RBC 3.42 (L) 3.87 - 5.11 MIL/uL   Hemoglobin 11.0 (L) 12.0 - 15.0 g/dL   HCT 30.3 (L) 36.0 - 46.0 %   MCV 88.6 78.0 - 100.0 fL   MCH 32.2 26.0 - 34.0 pg   MCHC 36.3 (H) 30.0 - 36.0 g/dL   RDW 14.0 11.5 - 15.5 %   Platelets 229 150 - 400 K/uL  Comprehensive metabolic panel     Status: Abnormal   Collection Time: 08/02/14  1:15 PM  Result Value Ref Range   Sodium 135 135 - 145 mmol/L   Potassium 3.3  (L) 3.5 - 5.1 mmol/L   Chloride 109 96 - 112 mmol/L   CO2 20 19 - 32 mmol/L   Glucose, Bld 90 70 - 99 mg/dL   BUN <5 (L) 6 - 23 mg/dL   Creatinine, Ser 0.42 (L) 0.50 - 1.10 mg/dL   Calcium 8.6 8.4 - 10.5 mg/dL   Total Protein 6.8 6.0 - 8.3 g/dL   Albumin 3.7 3.5 - 5.2 g/dL   AST 16 0 - 37 U/L   ALT 18 0 - 35 U/L   Alkaline Phosphatase 55 39 - 117 U/L   Total Bilirubin 0.9 0.3 - 1.2 mg/dL   GFR calc non Af Amer >90 >90 mL/min   GFR calc Af Amer >90 >90 mL/min   Anion gap 6 5 - 15   ASSESSMENT 1. Tingling in extremities   2. Carpal tunnel syndrome during pregnancy     PLAN Consult Dr Ouida Sills Discharge home Reassurance provided about unlikely allergic reaction, most likely pregnancy related circulation changes/or carpal tunnel of pregnancy F/U in office as scheduled or sooner if symptoms persist Return to MAU as needed for emergencies    Medication List    TAKE these medications        acetaminophen 325 MG tablet  Commonly known as:  TYLENOL  Take 975 mg by mouth every 6 (six) hours as needed for headache.     albuterol 108 (90 BASE) MCG/ACT inhaler  Commonly known as:  PROVENTIL HFA;VENTOLIN HFA  Inhale 2 puffs into the lungs every 6 (six) hours as needed for wheezing or shortness of breath.     butalbital-acetaminophen-caffeine 50-325-40 MG per tablet  Commonly known as:  FIORICET  Take 1-2 tablets by mouth every 6 (six) hours as needed for headache.     PRENATAL COMPLETE 14-0.4 MG Tabs  Take 1 tablet by mouth daily.     promethazine 25 MG tablet  Commonly known as:  PHENERGAN  Take 1 tablet (25 mg total) by mouth every 6 (six) hours as needed for nausea or vomiting.       Follow-up Information    Follow up with Olga Millers, MD.   Specialty:  Obstetrics and Gynecology   Contact information:   [place] [address] [phone]       Follow up with [place].   Why:  As  needed for emergencies   Contact information:   [address] [id number] [place] Certified Nurse-Midwife 08/02/2014  3:05 PM

## 2014-08-02 NOTE — Discharge Instructions (Signed)
Carpal Tunnel Syndrome The carpal tunnel is a narrow area located on the palm side of your wrist. The tunnel is formed by the wrist bones and ligaments. Nerves, blood vessels, and tendons pass through the carpal tunnel. Repeated wrist motion or certain diseases may cause swelling within the tunnel. This swelling pinches the main nerve in the wrist (median nerve) and causes the painful hand and arm condition called carpal tunnel syndrome. CAUSES   Repeated wrist motions.  Wrist injuries.  Certain diseases like arthritis, diabetes, alcoholism, hyperthyroidism, and kidney failure.  Obesity.  Pregnancy. SYMPTOMS   A "pins and needles" feeling in your fingers or hand, especially in your thumb, index and middle fingers.  Tingling or numbness in your fingers or hand.  An aching feeling in your entire arm, especially when your wrist and elbow are bent for long periods of time.  Wrist pain that goes up your arm to your shoulder.  Pain that goes down into your palm or fingers.  A weak feeling in your hands. DIAGNOSIS  Your health care provider will take your history and perform a physical exam. An electromyography test may be needed. This test measures electrical signals sent out by your nerves into the muscles. The electrical signals are usually slowed by carpal tunnel syndrome. You may also need X-rays. TREATMENT  Carpal tunnel syndrome may clear up by itself. Your health care provider may recommend a wrist splint or medicine such as a nonsteroidal anti-inflammatory medicine. Cortisone injections may help. Sometimes, surgery may be needed to free the pinched nerve.  HOME CARE INSTRUCTIONS   Take all medicine as directed by your health care provider. Only take over-the-counter or prescription medicines for pain, discomfort, or fever as directed by your health care provider.  If you were given a splint to keep your wrist from bending, wear it as directed. It is important to wear the splint at  night. Wear the splint for as long as you have pain or numbness in your hand, arm, or wrist. This may take 1 to 2 months.  Rest your wrist from any activity that may be causing your pain. If your symptoms are work-related, you may need to talk to your employer about changing to a job that does not require using your wrist.  Put ice on your wrist after long periods of wrist activity.  Put ice in a plastic bag.  Place a towel between your skin and the bag.  Leave the ice on for 15-20 minutes, 03-04 times a day.  Keep all follow-up visits as directed by your health care provider. This includes any orthopedic referrals, physical therapy, and rehabilitation. Any delay in getting necessary care could result in a delay or failure of your condition to heal. SEEK IMMEDIATE MEDICAL CARE IF:   You have new, unexplained symptoms.  Your symptoms get worse and are not helped or controlled with medicines. MAKE SURE YOU:   Understand these instructions.  Will watch your condition.  Will get help right away if you are not doing well or get worse. Document Released: 06/13/2000 Document Revised: 10/31/2013 Document Reviewed: 05/02/2011 [employer] Patient Information 2015 [employer]. This information is not intended to replace advice given to you by your health care provider. Make sure you discuss any questions you have with your health care provider.  

## 2014-08-13 ENCOUNTER — Inpatient Hospital Stay (HOSPITAL_COMMUNITY)
Admission: RE | Admit: 2014-08-13 | Discharge: 2014-08-14 | Disposition: A | Discharge status: Home / Self Care | Payer: Medicaid Other | Source: Ambulatory Visit | Attending: Obstetrics and Gynecology | Admitting: Obstetrics and Gynecology

## 2014-08-13 ENCOUNTER — Inpatient Hospital Stay (HOSPITAL_COMMUNITY): Payer: Medicaid Other

## 2014-08-13 ENCOUNTER — Encounter (HOSPITAL_COMMUNITY): Payer: Self-pay | Admitting: *Deleted

## 2014-08-13 DIAGNOSIS — O4402 Placenta previa specified as without hemorrhage, second trimester: Secondary | ICD-10-CM | POA: Diagnosis not present

## 2014-08-13 DIAGNOSIS — O26852 Spotting complicating pregnancy, second trimester: Secondary | ICD-10-CM | POA: Insufficient documentation

## 2014-08-13 DIAGNOSIS — Z3A16 16 weeks gestation of pregnancy: Secondary | ICD-10-CM | POA: Diagnosis not present

## 2014-08-13 DIAGNOSIS — O4690 Antepartum hemorrhage, unspecified, unspecified trimester: Secondary | ICD-10-CM | POA: Insufficient documentation

## 2014-08-13 DIAGNOSIS — Z87891 Personal history of nicotine dependence: Secondary | ICD-10-CM | POA: Insufficient documentation

## 2014-08-13 DIAGNOSIS — N920 Excessive and frequent menstruation with regular cycle: Secondary | ICD-10-CM

## 2014-08-13 DIAGNOSIS — O4442 Low lying placenta NOS or without hemorrhage, second trimester: Secondary | ICD-10-CM

## 2014-08-13 LAB — WET PREP, GENITAL
Clue Cells Wet Prep HPF POC: NONE SEEN
Trich, Wet Prep: NONE SEEN
Yeast Wet Prep HPF POC: NONE SEEN

## 2014-08-13 LAB — URINALYSIS, ROUTINE W REFLEX MICROSCOPIC
BILIRUBIN URINE: NEGATIVE
Glucose, UA: NEGATIVE mg/dL
Ketones, ur: 15 mg/dL — AB
NITRITE: NEGATIVE
PROTEIN: NEGATIVE mg/dL
Specific Gravity, Urine: 1.02 (ref 1.005–1.030)
Urobilinogen, UA: 0.2 mg/dL (ref 0.0–1.0)
pH: 6.5 (ref 5.0–8.0)

## 2014-08-13 LAB — POCT FERN TEST: POCT Fern Test: NEGATIVE

## 2014-08-13 LAB — URINE MICROSCOPIC-ADD ON

## 2014-08-13 NOTE — MAU Note (Signed)
Pt presents with vaginal bleeding, vaginal discharge, LOF, mucous discharge and cramping.

## 2014-08-13 NOTE — MAU Provider Note (Signed)
History     CSN: [id number]  Arrival date and time: 08/13/14 2224   First Provider Initiated Contact with Patient 08/13/14 2305      Chief Complaint  Patient presents with  . Vaginal Bleeding  . Vaginal Discharge  . Abdominal Cramping   HPI  Katrina [name] is a 24 y.o. G3P2002 at [redacted]w[redacted]d who presents today with spotting, and mucous-y discharge. She states that she has had streaks of blood when she wipes today. She states that she had intercourse last night. However, she states that she has been having regular intercourse for "weeks", and has not had this problem until 08/09/14. She also reports that she has had leaking of fluid since 08/09/14 as well. She states that she thought she was peeing, but it was clear. She is not sure if it is pee or not. She states that is just comes and goes, and she doesn't need to wear a pad because she cannot predict when the leaking will occur. She has an appointment with the office on 08/14/14 at 11:30 am.   Past Medical History  Diagnosis Date  . Asthma   . Herpes simplex of female genitalia     last outbreak 4 months ago  . Migraines   . Vaginal Pap smear, abnormal     Past Surgical History  Procedure Laterality Date  . Colposcopy  03/2014    Family History  Problem Relation Age of Onset  . Asthma Mother   . Asthma Sister   . Asthma Sister     History  Substance Use Topics  . Smoking status: Former Smoker -- 0.25 packs/day for 2 years    Types: Cigarettes    Quit date: 05/02/2013  . Smokeless tobacco: Never Used  . Alcohol Use: No     Comment: socially    Allergies:  Allergies  Allergen Reactions  . Shellfish Allergy Shortness Of Breath and Swelling    Prescriptions prior to admission  Medication Sig Dispense Refill Last Dose  . acetaminophen (TYLENOL) 325 MG tablet Take 975 mg by mouth every 6 (six) hours as needed for headache.   Past Week at Unknown time  . albuterol (PROVENTIL HFA;VENTOLIN HFA) 108 (90 BASE) MCG/ACT  inhaler Inhale 2 puffs into the lungs every 6 (six) hours as needed for wheezing or shortness of breath.   Past Month at Unknown time  . butalbital-acetaminophen-caffeine (FIORICET) 50-325-40 MG per tablet Take 1-2 tablets by mouth every 6 (six) hours as needed for headache. 20 tablet 0 Past Week at Unknown time  . Prenatal Vit-Fe Fumarate-FA (PRENATAL COMPLETE) 14-0.4 MG TABS Take 1 tablet by mouth daily. 30 each 2 Past Week at Unknown time  . promethazine (PHENERGAN) 25 MG tablet Take 1 tablet (25 mg total) by mouth every 6 (six) hours as needed for nausea or vomiting. 30 tablet 1 08/12/2014 at Unknown time    ROS Physical Exam   Blood pressure 122/68, pulse 77, temperature 99.1 F (37.3 C), temperature source Oral, resp. rate 16, height 5\' 1"  (1.549 m), weight 72.576 kg (160 lb), last menstrual period 04/03/2014.  Physical Exam  Nursing note and vitals reviewed. Constitutional: She is oriented to person, place, and time. She appears well-developed and well-nourished. No distress.  Cardiovascular: Normal rate.   Respiratory: Effort normal.  GI: Soft. There is no tenderness. There is no rebound.  Genitourinary:   External: no lesion Vagina: small amount of pink discharge. No pooling,  Cervix: pink, smooth, no fluid seen with valsalva  Uterus: AGA    Neurological: She is alert and oriented to person, place, and time.  Skin: Skin is warm and dry.  Psychiatric: She has a normal mood and affect.    MAU Course  Procedures  Results for orders placed or performed during the hospital encounter of 08/13/14 (from the past 24 hour(s))  Urinalysis, Routine w reflex microscopic     Status: Abnormal   Collection Time: 08/13/14 10:40 PM  Result Value Ref Range   Color, Urine YELLOW YELLOW   APPearance HAZY (A) CLEAR   Specific Gravity, Urine 1.020 1.005 - 1.030   pH 6.5 5.0 - 8.0   Glucose, UA NEGATIVE NEGATIVE mg/dL   Hgb urine dipstick MODERATE (A) NEGATIVE   Bilirubin Urine NEGATIVE  NEGATIVE   Ketones, ur 15 (A) NEGATIVE mg/dL   Protein, ur NEGATIVE NEGATIVE mg/dL   Urobilinogen, UA 0.2 0.0 - 1.0 mg/dL   Nitrite NEGATIVE NEGATIVE   Leukocytes, UA SMALL (A) NEGATIVE  Urine microscopic-add on     Status: None   Collection Time: 08/13/14 10:40 PM  Result Value Ref Range   Squamous Epithelial / LPF RARE RARE   WBC, UA 0-2 <3 WBC/hpf   RBC / HPF 3-6 <3 RBC/hpf   Bacteria, UA RARE RARE  Wet prep, genital     Status: Abnormal   Collection Time: 08/13/14 11:15 PM  Result Value Ref Range   Yeast Wet Prep HPF POC NONE SEEN NONE SEEN   Trich, Wet Prep NONE SEEN NONE SEEN   Clue Cells Wet Prep HPF POC NONE SEEN NONE SEEN   WBC, Wet Prep HPF POC MODERATE (A) NONE SEEN  Fern Test     Status: None   Collection Time: 08/13/14 11:53 PM  Result Value Ref Range   POCT Fern Test Negative = intact amniotic membranes    OB US: Cervix 4cm, debris near internal os Low lying placenta 1.0 cm from interior os.   0100: D/W Dr. Ouida Sills, ok for DC home.   Assessment and Plan   1. Spotting affecting pregnancy in second trimester, antepartum   2. Spotting   3. Low-lying placenta in second trimester    Second trimester precautions reviewed Return to MAU as needed FU with the office as planned for later today  Follow-up Information    Follow up with Olga Millers, MD.   Specialty:  Obstetrics and Gynecology   Why:  As scheduled   Contact information:   [address] [place] [id number] [phone]       Mathis Bud 08/13/2014, 11:06 PM

## 2014-08-13 NOTE — MAU Note (Signed)
Pt c/o leaking clear fluid since Wednesday but she hasnt had to wear a pad.   Also states that she has started cramping with bloody mucous since yesterday. Called Dr. Ouida Sills tonight and was told to come in.

## 2014-08-14 ENCOUNTER — Encounter (HOSPITAL_COMMUNITY): Payer: Self-pay | Admitting: *Deleted

## 2014-08-14 ENCOUNTER — Inpatient Hospital Stay (HOSPITAL_COMMUNITY): Payer: Medicaid Other

## 2014-08-14 ENCOUNTER — Inpatient Hospital Stay (EMERGENCY_DEPARTMENT_HOSPITAL)
Admission: AD | Admit: 2014-08-14 | Discharge: 2014-08-14 | Disposition: A | Discharge status: Home / Self Care | Payer: Medicaid Other | Source: Ambulatory Visit | Attending: Obstetrics & Gynecology | Admitting: Obstetrics & Gynecology

## 2014-08-14 DIAGNOSIS — O26892 Other specified pregnancy related conditions, second trimester: Secondary | ICD-10-CM

## 2014-08-14 DIAGNOSIS — Z3A16 16 weeks gestation of pregnancy: Secondary | ICD-10-CM

## 2014-08-14 DIAGNOSIS — N898 Other specified noninflammatory disorders of vagina: Secondary | ICD-10-CM

## 2014-08-14 DIAGNOSIS — O4290 Premature rupture of membranes, unspecified as to length of time between rupture and onset of labor, unspecified weeks of gestation: Secondary | ICD-10-CM | POA: Insufficient documentation

## 2014-08-14 DIAGNOSIS — O4690 Antepartum hemorrhage, unspecified, unspecified trimester: Secondary | ICD-10-CM | POA: Insufficient documentation

## 2014-08-14 DIAGNOSIS — O26852 Spotting complicating pregnancy, second trimester: Secondary | ICD-10-CM

## 2014-08-14 LAB — GC/CHLAMYDIA PROBE AMP (~~LOC~~) NOT AT ARMC
Chlamydia: NEGATIVE
Neisseria Gonorrhea: NEGATIVE

## 2014-08-14 NOTE — MAU Note (Signed)
Fern test negative, confirmed by Eloise Levels, CNM

## 2014-08-14 NOTE — Discharge Instructions (Signed)

## 2014-08-14 NOTE — Discharge Instructions (Signed)
Second Trimester of Pregnancy The second trimester is from week 13 through week 28, months 4 through 6. The second trimester is often a time when you feel your best. Your body has also adjusted to being pregnant, and you begin to feel better physically. Usually, morning sickness has lessened or quit completely, you may have more energy, and you may have an increase in appetite. The second trimester is also a time when the fetus is growing rapidly. At the end of the sixth month, the fetus is about 9 inches long and weighs about 1 pounds. You will likely begin to feel the baby move (quickening) between 18 and 20 weeks of the pregnancy. BODY CHANGES Your body goes through many changes during pregnancy. The changes vary from woman to woman.   Your weight will continue to increase. You will notice your lower abdomen bulging out.  You may begin to get stretch marks on your hips, abdomen, and breasts.  You may develop headaches that can be relieved by medicines approved by your health care provider.  You may urinate more often because the fetus is pressing on your bladder.  You may develop or continue to have heartburn as a result of your pregnancy.  You may develop constipation because certain hormones are causing the muscles that push waste through your intestines to slow down.  You may develop hemorrhoids or swollen, bulging veins (varicose veins).  You may have back pain because of the weight gain and pregnancy hormones relaxing your joints between the bones in your pelvis and as a result of a shift in weight and the muscles that support your balance.  Your breasts will continue to grow and be tender.  Your gums may bleed and may be sensitive to brushing and flossing.  Dark spots or blotches (chloasma, mask of pregnancy) may develop on your face. This will likely fade after the baby is born.  A dark line from your belly button to the pubic area (linea nigra) may appear. This will likely fade  after the baby is born.  You may have changes in your hair. These can include thickening of your hair, rapid growth, and changes in texture. Some women also have hair loss during or after pregnancy, or hair that feels dry or thin. Your hair will most likely return to normal after your baby is born. WHAT TO EXPECT AT YOUR PRENATAL VISITS During a routine prenatal visit:  You will be weighed to make sure you and the fetus are growing normally.  Your blood pressure will be taken.  Your abdomen will be measured to track your baby's growth.  The fetal heartbeat will be listened to.  Any test results from the previous visit will be discussed. Your health care provider may ask you:  How you are feeling.  If you are feeling the baby move.  If you have had any abnormal symptoms, such as leaking fluid, bleeding, severe headaches, or abdominal cramping.  If you have any questions. Other tests that may be performed during your second trimester include:  Blood tests that check for:  Low iron levels (anemia).  Gestational diabetes (between 24 and 28 weeks).  Rh antibodies.  Urine tests to check for infections, diabetes, or protein in the urine.  An ultrasound to confirm the proper growth and development of the baby.  An amniocentesis to check for possible genetic problems.  Fetal screens for spina bifida and Down syndrome. HOME CARE INSTRUCTIONS   Avoid all smoking, herbs, alcohol, and unprescribed   drugs. These chemicals affect the formation and growth of the baby.  Follow your health care provider's instructions regarding medicine use. There are medicines that are either safe or unsafe to take during pregnancy.  Exercise only as directed by your health care provider. Experiencing uterine cramps is a good sign to stop exercising.  Continue to eat regular, healthy meals.  Wear a good support bra for breast tenderness.  Do not use hot tubs, steam rooms, or saunas.  Wear your  seat belt at all times when driving.  Avoid raw meat, uncooked cheese, cat litter boxes, and soil used by cats. These carry germs that can cause birth defects in the baby.  Take your prenatal vitamins.  Try taking a stool softener (if your health care provider approves) if you develop constipation. Eat more high-fiber foods, such as fresh vegetables or fruit and whole grains. Drink plenty of fluids to keep your urine clear or pale yellow.  Take warm sitz baths to soothe any pain or discomfort caused by hemorrhoids. Use hemorrhoid cream if your health care provider approves.  If you develop varicose veins, wear support hose. Elevate your feet for 15 minutes, 3-4 times a day. Limit salt in your diet.  Avoid heavy lifting, wear low heel shoes, and practice good posture.  Rest with your legs elevated if you have leg cramps or low back pain.  Visit your dentist if you have not gone yet during your pregnancy. Use a soft toothbrush to brush your teeth and be gentle when you floss.  A sexual relationship may be continued unless your health care provider directs you otherwise.  Continue to go to all your prenatal visits as directed by your health care provider. SEEK MEDICAL CARE IF:   You have dizziness.  You have mild pelvic cramps, pelvic pressure, or nagging pain in the abdominal area.  You have persistent nausea, vomiting, or diarrhea.  You have a bad smelling vaginal discharge.  You have pain with urination. SEEK IMMEDIATE MEDICAL CARE IF:   You have a fever.  You are leaking fluid from your vagina.  You have spotting or bleeding from your vagina.  You have severe abdominal cramping or pain.  You have rapid weight gain or loss.  You have shortness of breath with chest pain.  You notice sudden or extreme swelling of your face, hands, ankles, feet, or legs.  You have not felt your baby move in over an hour.  You have severe headaches that do not go away with  medicine.  You have vision changes. Document Released: 06/10/2001 Document Revised: 06/21/2013 Document Reviewed: 08/17/2012 [employer] Patient Information 2015 [employer]. This information is not intended to replace advice given to you by your health care provider. Make sure you discuss any questions you have with your health care provider.  

## 2014-08-14 NOTE — MAU Note (Signed)
Pt from office with MD stating water broke. Pt having vaginal bleeding and discharge.  Cramping pain in abd.

## 2014-08-14 NOTE — MAU Provider Note (Signed)
History     CSN: [id number]  Arrival date and time: 08/14/14 1225   None     No chief complaint on file.  HPI This is a 24 y.o. female at [redacted]w[redacted]d who presents from office for fern test and reexam. Has been "leaking" fluid since Wednesday and is worried her water has broken. Was seen in the office and states Dr Alwyn Pea examined her and "a lot of water gushed out".  Was sent here for fern test. Was seen last night around midnight and her fern test was negative, wet prep negative, and US showed low lying placenta with normal amniotic fluid.   RN Note: Pt from office with MD stating water broke. Pt having vaginal bleeding and discharge. Cramping pain in abd.          OB History    Gravida Para Term Preterm AB TAB SAB Ectopic Multiple Living   3 2 2  0 0 0 0 0 0 2      Past Medical History  Diagnosis Date  . Asthma   . Herpes simplex of female genitalia     last outbreak 4 months ago  . Migraines   . Vaginal Pap smear, abnormal     Past Surgical History  Procedure Laterality Date  . Colposcopy  03/2014    Family History  Problem Relation Age of Onset  . Asthma Mother   . Asthma Sister   . Asthma Sister     History  Substance Use Topics  . Smoking status: Former Smoker -- 0.25 packs/day for 2 years    Types: Cigarettes    Quit date: 05/02/2013  . Smokeless tobacco: Never Used  . Alcohol Use: No     Comment: socially    Allergies:  Allergies  Allergen Reactions  . Shellfish Allergy Shortness Of Breath and Swelling    Prescriptions prior to admission  Medication Sig Dispense Refill Last Dose  . acetaminophen (TYLENOL) 325 MG tablet Take 975 mg by mouth every 6 (six) hours as needed for headache.   Past Week at Unknown time  . albuterol (PROVENTIL HFA;VENTOLIN HFA) 108 (90 BASE) MCG/ACT inhaler Inhale 2 puffs into the lungs every 6 (six) hours as needed for wheezing or shortness of breath.   Past Month at Unknown time  . butalbital-acetaminophen-caffeine  (FIORICET) 50-325-40 MG per tablet Take 1-2 tablets by mouth every 6 (six) hours as needed for headache. 20 tablet 0 Past Week at Unknown time  . Prenatal Vit-Fe Fumarate-FA (PRENATAL COMPLETE) 14-0.4 MG TABS Take 1 tablet by mouth daily. 30 each 2 Past Week at Unknown time  . promethazine (PHENERGAN) 25 MG tablet Take 1 tablet (25 mg total) by mouth every 6 (six) hours as needed for nausea or vomiting. 30 tablet 1 08/12/2014 at Unknown time    Review of Systems  Constitutional: Negative for fever, chills and malaise/fatigue.  Gastrointestinal: Positive for abdominal pain (some cramping). Negative for nausea, vomiting, diarrhea and constipation.  Genitourinary:       Fluid leaking from vagina    Physical Exam   Blood pressure 121/68, pulse 81, temperature 98.8 F (37.1 C), temperature source Oral, resp. rate 22, height 5\' 1"  (1.549 m), weight 159 lb (72.122 kg), last menstrual period 04/03/2014.  Physical Exam  Constitutional: She is oriented to person, place, and time. She appears well-developed and well-nourished. No distress.  HENT:  Head: Normocephalic.  Cardiovascular: Normal rate.   Respiratory: Effort normal.  GI: Soft. She exhibits no distension. There is no  tenderness. There is no rebound and no guarding.  Genitourinary: Vaginal discharge (small pooling of pink fluid, watery, Fern Negative) found.  Musculoskeletal: Normal range of motion.  Neurological: She is alert and oriented to person, place, and time.  Skin: Skin is warm and dry.  Psychiatric: She has a normal mood and affect.    MAU Course  Procedures  MDM Fern Test Negative US done to compare Amniotic Fluid level with that of last night.    Assessment and Plan  A:  SIUP at [redacted]w[redacted]d        Watery discharge from vagina, negative Fern and normal [place]       Low Lying Placenta       Second Trimester Bleeding  P:  Discussed findings with patient and Dr Alwyn Pea       Strict Pelvic rest       Precautions for infection,  pain, bleeding, fever       Followup in office next week    [facility] 08/14/2014, 12:46 PM

## 2014-08-16 ENCOUNTER — Inpatient Hospital Stay (HOSPITAL_COMMUNITY)
Admission: AD | Admit: 2014-08-16 | Discharge: 2014-08-16 | Disposition: A | Discharge status: Home / Self Care | Payer: Medicaid Other | Source: Ambulatory Visit | Attending: Obstetrics and Gynecology | Admitting: Obstetrics and Gynecology

## 2014-08-16 ENCOUNTER — Telehealth (HOSPITAL_COMMUNITY): Payer: Self-pay | Admitting: *Deleted

## 2014-08-16 ENCOUNTER — Inpatient Hospital Stay (HOSPITAL_COMMUNITY): Payer: Medicaid Other

## 2014-08-16 ENCOUNTER — Encounter (HOSPITAL_COMMUNITY): Payer: Self-pay | Admitting: *Deleted

## 2014-08-16 DIAGNOSIS — Z3A17 17 weeks gestation of pregnancy: Secondary | ICD-10-CM

## 2014-08-16 DIAGNOSIS — O4692 Antepartum hemorrhage, unspecified, second trimester: Secondary | ICD-10-CM

## 2014-08-16 DIAGNOSIS — O4412 Placenta previa with hemorrhage, second trimester: Secondary | ICD-10-CM | POA: Insufficient documentation

## 2014-08-16 DIAGNOSIS — O42112 Preterm premature rupture of membranes, onset of labor more than 24 hours following rupture, second trimester: Secondary | ICD-10-CM | POA: Insufficient documentation

## 2014-08-16 DIAGNOSIS — Z3A16 16 weeks gestation of pregnancy: Secondary | ICD-10-CM | POA: Insufficient documentation

## 2014-08-16 DIAGNOSIS — Z87891 Personal history of nicotine dependence: Secondary | ICD-10-CM | POA: Insufficient documentation

## 2014-08-16 DIAGNOSIS — O209 Hemorrhage in early pregnancy, unspecified: Secondary | ICD-10-CM | POA: Diagnosis present

## 2014-08-16 DIAGNOSIS — O4440 Low lying placenta NOS or without hemorrhage, unspecified trimester: Secondary | ICD-10-CM | POA: Insufficient documentation

## 2014-08-16 LAB — WET PREP, GENITAL
CLUE CELLS WET PREP: NONE SEEN
TRICH WET PREP: NONE SEEN
YEAST WET PREP: NONE SEEN

## 2014-08-16 LAB — URINALYSIS, ROUTINE W REFLEX MICROSCOPIC
BILIRUBIN URINE: NEGATIVE
Glucose, UA: NEGATIVE mg/dL
Ketones, ur: NEGATIVE mg/dL
NITRITE: NEGATIVE
PROTEIN: NEGATIVE mg/dL
SPECIFIC GRAVITY, URINE: 1.015 (ref 1.005–1.030)
UROBILINOGEN UA: 1 mg/dL (ref 0.0–1.0)
pH: >9 — ABNORMAL HIGH (ref 5.0–8.0)

## 2014-08-16 LAB — URINE MICROSCOPIC-ADD ON

## 2014-08-16 NOTE — MAU Note (Signed)
Pt states she has had some vaginal bleeding Wednesday last week pt was checked Sunday, and Monday she was checked and was sent to MAU and was elevated. Pt states she is having bleeding that she does  not know what is going on

## 2014-08-16 NOTE — Telephone Encounter (Signed)
Telephoned patient at home # and advised would need to do paperwork for [facility] medicaid to cover LEEP procedure done in Oct. Patient states has medicaid due to pregnancy. This will not cover LEEP done in Oct. Patient states will need to call at another time due to being at hospital. Patient did not want to schedule at time to fill out paperwork.

## 2014-08-16 NOTE — Discharge Instructions (Signed)
Pelvic Rest Pelvic rest is sometimes recommended for women when:   The placenta is partially or completely covering the opening of the cervix (placenta previa).  There is bleeding between the uterine wall and the amniotic sac in the first trimester (subchorionic hemorrhage).  The cervix begins to open without labor starting (incompetent cervix, cervical insufficiency).  The labor is too early (preterm labor). HOME CARE INSTRUCTIONS  Do not have sexual intercourse, stimulation, or an orgasm.  Do not use tampons, douche, or put anything in the vagina.  Do not lift anything over 10 pounds (4.5 kg).  Avoid strenuous activity or straining your pelvic muscles. SEEK MEDICAL CARE IF:  You have any vaginal bleeding during pregnancy. Treat this as a potential emergency.  You have cramping pain felt low in the stomach (stronger than menstrual cramps).  You notice vaginal discharge (watery, mucus, or bloody).  You have a low, dull backache.  There are regular contractions or uterine tightening. SEEK IMMEDIATE MEDICAL CARE IF: You have vaginal bleeding and have placenta previa.  Document Released: 10/11/2010 Document Revised: 09/08/2011 Document Reviewed: 10/11/2010 [facility] Patient Information 2015 [place], [place]. This information is not intended to replace advice given to you by your health care provider. Make sure you discuss any questions you have with your health care provider.  Vaginal Bleeding During Pregnancy, Second Trimester  A small amount of bleeding (spotting) from the vagina is common in pregnancy. Sometimes the bleeding is normal and is not a problem, and sometimes it is a sign of something serious. Be sure to tell your doctor about any bleeding from your vagina right away. HOME CARE  Watch your condition for any changes.  Follow your doctor's instructions about how active you can be.  If you are on bed rest:  You may need to stay in bed and only get up to use the  bathroom.  You may be allowed to do some activities.  If you need help, make plans for someone to help you.  Write down:  The number of pads you use each day.  How often you change pads.  How soaked (saturated) your pads are.  Do not use tampons.  Do not douche.  Do not have sex or orgasms until your doctor says it is okay.  If you pass any tissue from your vagina, save the tissue so you can show it to your doctor.  Only take medicines as told by your doctor.  Do not take aspirin because it can make you bleed.  Do not exercise, lift heavy weights, or do any activities that take a lot of energy and effort unless your doctor says it is okay.  Keep all follow-up visits as told by your doctor. GET HELP IF:   You bleed from your vagina.  You have cramps.  You have labor pains.  You have a fever that does not go away after you take medicine. GET HELP RIGHT AWAY IF:  You have very bad cramps in your back or belly (abdomen).  You have contractions.  You have chills.  You pass large clots or tissue from your vagina.  You bleed more.  You feel light-headed or weak.  You pass out (faint).  You are leaking fluid or have a gush of fluid from your vagina. MAKE SURE YOU:  Understand these instructions.  Will watch your condition.  Will get help right away if you are not doing well or get worse. Document Released: 10/31/2013 Document Reviewed: 02/21/2013 [employer] Patient Information 2015  [employer]. This information is not intended to replace advice given to you by your health care provider. Make sure you discuss any questions you have with your health care provider.

## 2014-08-16 NOTE — MAU Provider Note (Signed)
History     CSN: [id number]  Arrival date and time: 08/16/14 1547   First Provider Initiated Contact with Patient 08/16/14 1630      Chief Complaint  Patient presents with  . Vaginal Bleeding   HPI   [name] is a 24 y.o. female G3P2002 at [redacted]w[redacted]d who presents with vaginal bleeding.  This is her third visit to MAU this week for vaginal bleeding. She was seen Sunday and Monday. Monday morning she went to the Dr. And her dr sent her here due to ? Fluid/ bleeding. She was sent here for another [place]. US showed a low lying placenta with normal amniotic fluid levels. She was placed on pelvic rest.   She returns to MAU with increased vaginal bleeding and passing blood clots.  She is experiencing mild menstrual cramps; she feels like she is on her period.  She denies intercourse; she has been on pelvic rest.   OB History    Gravida Para Term Preterm AB TAB SAB Ectopic Multiple Living   3 2 2  0 0 0 0 0 0 2      Past Medical History  Diagnosis Date  . Asthma   . Herpes simplex of female genitalia     last outbreak 4 months ago  . Migraines   . Vaginal Pap smear, abnormal     Past Surgical History  Procedure Laterality Date  . Colposcopy  03/2014    Family History  Problem Relation Age of Onset  . Asthma Mother   . Asthma Sister   . Asthma Sister     History  Substance Use Topics  . Smoking status: Former Smoker -- 0.25 packs/day for 2 years    Types: Cigarettes    Quit date: 05/02/2013  . Smokeless tobacco: Never Used  . Alcohol Use: No     Comment: socially    Allergies:  Allergies  Allergen Reactions  . Shellfish Allergy Shortness Of Breath and Swelling    Prescriptions prior to admission  Medication Sig Dispense Refill Last Dose  . albuterol (PROVENTIL HFA;VENTOLIN HFA) 108 (90 BASE) MCG/ACT inhaler Inhale 2 puffs into the lungs every 6 (six) hours as needed for wheezing or shortness of breath.   Past Week at Unknown time  .  butalbital-acetaminophen-caffeine (FIORICET) 50-325-40 MG per tablet Take 1-2 tablets by mouth every 6 (six) hours as needed for headache. 20 tablet 0 Past Week at Unknown time  . Prenatal Vit-Fe Fumarate-FA (PRENATAL COMPLETE) 14-0.4 MG TABS Take 1 tablet by mouth daily. 30 each 2 08/15/2014 at Unknown time  . promethazine (PHENERGAN) 25 MG tablet Take 1 tablet (25 mg total) by mouth every 6 (six) hours as needed for nausea or vomiting. 30 tablet 1 08/16/2014 at Unknown time   Results for orders placed or performed during the hospital encounter of 08/16/14 (from the past 48 hour(s))  Urinalysis, Routine w reflex microscopic     Status: Abnormal   Collection Time: 08/16/14  3:53 PM  Result Value Ref Range   Color, Urine YELLOW YELLOW   APPearance CLOUDY (A) CLEAR   Specific Gravity, Urine 1.015 1.005 - 1.030   pH >9.0 (H) 5.0 - 8.0   Glucose, UA NEGATIVE NEGATIVE mg/dL   Hgb urine dipstick SMALL (A) NEGATIVE   Bilirubin Urine NEGATIVE NEGATIVE   Ketones, ur NEGATIVE NEGATIVE mg/dL   Protein, ur NEGATIVE NEGATIVE mg/dL   Urobilinogen, UA 1.0 0.0 - 1.0 mg/dL   Nitrite NEGATIVE NEGATIVE   Leukocytes, UA  SMALL (A) NEGATIVE  Urine microscopic-add on     Status: Abnormal   Collection Time: 08/16/14  3:53 PM  Result Value Ref Range   Squamous Epithelial / LPF FEW (A) RARE   WBC, UA 0-2 <3 WBC/hpf   RBC / HPF 0-2 <3 RBC/hpf   Urine-Other AMORPHOUS URATES/PHOSPHATES     Comment: MICROSCOPIC EXAM PERFORMED ON UNCONCENTRATED URINE <2 ML OF URINE COLLECTED   Wet prep, genital     Status: Abnormal   Collection Time: 08/16/14  4:50 PM  Result Value Ref Range   Yeast Wet Prep HPF POC NONE SEEN NONE SEEN   Trich, Wet Prep NONE SEEN NONE SEEN   Clue Cells Wet Prep HPF POC NONE SEEN NONE SEEN   WBC, Wet Prep HPF POC MODERATE (A) NONE SEEN    Comment: MODERATE BACTERIA SEEN    Review of Systems  Constitutional: Negative for fever and chills.  Genitourinary: Negative for dysuria, urgency,  frequency and hematuria.   Physical Exam   Blood pressure 114/62, pulse 75, temperature 99.5 F (37.5 C), temperature source Oral, resp. rate 18, height 5\' 1"  (1.549 m), weight 74.208 kg (163 lb 9.6 oz), last menstrual period 04/03/2014.  Physical Exam  Constitutional: She is oriented to person, place, and time. She appears well-developed and well-nourished. No distress.  HENT:  Head: Normocephalic.  Eyes: Pupils are equal, round, and reactive to light.  Neck: Neck supple.  GI: Soft. She exhibits no distension. There is no tenderness. There is no rebound and no guarding.  Genitourinary:  Speculum exam: Vagina - Small amount of creamy, pink, particulate discharge.  Cervix - No active bleeding from os.  Bimanual exam: deferred  wet prep done Chaperone present for exam.   Musculoskeletal: Normal range of motion.  Neurological: She is alert and oriented to person, place, and time.  Skin: Skin is warm. She is not diaphoretic.  Psychiatric: Her behavior is normal.    MAU Course  Procedures  None  MDM  +fetal heart tones by doppler  Wet prep UA O negative blood type; patient received rhogam in the first trimester  US shows no changed from previous US: Posterior placenta, low lying, 1 cm above cervical os. FHR 161. Cervical length 4.2 cm  Discussed [place] results with Dr. Philis Pique   Assessment and Plan     A:  SIUP at [redacted]w[redacted]d   Vaginal bleeding in second trimester of pregnancy   Low Lying Placenta  Cervical length 4.2 cm   P: Discussed findings with patient and Dr, Philis Pique   Strict Pelvic rest  Precautions for bleeding discussed   Followup in office next week       Return to MAU if symptoms worsen   Darrelyn Hillock Rasch, NP  08/16/2014 6:39 PM

## 2014-08-16 NOTE — MAU Note (Signed)
Pt presents complaining of vaginal bleeding that started yesterday and had stopped. Had one more episode of bleeding with clots today. Pt also having menstrual cramps.

## 2014-08-17 DIAGNOSIS — O4440 Low lying placenta NOS or without hemorrhage, unspecified trimester: Secondary | ICD-10-CM | POA: Insufficient documentation

## 2014-08-17 DIAGNOSIS — O42112 Preterm premature rupture of membranes, onset of labor more than 24 hours following rupture, second trimester: Secondary | ICD-10-CM | POA: Insufficient documentation

## 2014-08-18 ENCOUNTER — Encounter: Payer: Self-pay | Admitting: Neurology

## 2014-08-21 ENCOUNTER — Ambulatory Visit (INDEPENDENT_AMBULATORY_CARE_PROVIDER_SITE_OTHER): Payer: Medicaid Other | Admitting: Neurology

## 2014-08-21 ENCOUNTER — Encounter: Payer: Self-pay | Admitting: Neurology

## 2014-08-21 VITALS — BP 108/65 | HR 76 | Resp 18 | Ht 61.0 in | Wt 164.0 lb

## 2014-08-21 DIAGNOSIS — G43009 Migraine without aura, not intractable, without status migrainosus: Secondary | ICD-10-CM

## 2014-08-21 DIAGNOSIS — O2692 Pregnancy related conditions, unspecified, second trimester: Secondary | ICD-10-CM

## 2014-08-21 DIAGNOSIS — O2690 Pregnancy related conditions, unspecified, unspecified trimester: Secondary | ICD-10-CM | POA: Insufficient documentation

## 2014-08-21 MED ORDER — PROMETHAZINE HCL 25 MG PO TABS: 25.0000 mg | ORAL_TABLET | Freq: Four times a day (QID) | ORAL | Status: DC | PRN

## 2014-08-21 MED ORDER — DOXYLAMINE SUCCINATE (SLEEP) 25 MG PO TABS: 25.0000 mg | ORAL_TABLET | Freq: Every evening | ORAL | Status: DC | PRN

## 2014-08-21 NOTE — Patient Instructions (Signed)
Second Trimester of Pregnancy The second trimester is from week 13 through week 28, months 4 through 6. The second trimester is often a time when you feel your best. Your body has also adjusted to being pregnant, and you begin to feel better physically. Usually, morning sickness has lessened or quit completely, you may have more energy, and you may have an increase in appetite. The second trimester is also a time when the fetus is growing rapidly. At the end of the sixth month, the fetus is about 9 inches long and weighs about 1 pounds. You will likely begin to feel the baby move (quickening) between 18 and 20 weeks of the pregnancy. BODY CHANGES Your body goes through many changes during pregnancy. The changes vary from woman to woman.   Your weight will continue to increase. You will notice your lower abdomen bulging out.  You may begin to get stretch marks on your hips, abdomen, and breasts.  You may develop headaches that can be relieved by medicines approved by your health care provider.  You may urinate more often because the fetus is pressing on your bladder.  You may develop or continue to have heartburn as a result of your pregnancy.  You may develop constipation because certain hormones are causing the muscles that push waste through your intestines to slow down.  You may develop hemorrhoids or swollen, bulging veins (varicose veins).  You may have back pain because of the weight gain and pregnancy hormones relaxing your joints between the bones in your pelvis and as a result of a shift in weight and the muscles that support your balance.  Your breasts will continue to grow and be tender.  Your gums may bleed and may be sensitive to brushing and flossing.  Dark spots or blotches (chloasma, mask of pregnancy) may develop on your face. This will likely fade after the baby is born.  A dark line from your belly button to the pubic area (linea nigra) may appear. This will likely fade  after the baby is born.  You may have changes in your hair. These can include thickening of your hair, rapid growth, and changes in texture. Some women also have hair loss during or after pregnancy, or hair that feels dry or thin. Your hair will most likely return to normal after your baby is born. WHAT TO EXPECT AT YOUR PRENATAL VISITS During a routine prenatal visit:  You will be weighed to make sure you and the fetus are growing normally.  Your blood pressure will be taken.  Your abdomen will be measured to track your baby's growth.  The fetal heartbeat will be listened to.  Any test results from the previous visit will be discussed. Your health care provider may ask you:  How you are feeling.  If you are feeling the baby move.  If you have had any abnormal symptoms, such as leaking fluid, bleeding, severe headaches, or abdominal cramping.  If you have any questions. Other tests that may be performed during your second trimester include:  Blood tests that check for:  Low iron levels (anemia).  Gestational diabetes (between 24 and 28 weeks).  Rh antibodies.  Urine tests to check for infections, diabetes, or protein in the urine.  An ultrasound to confirm the proper growth and development of the baby.  An amniocentesis to check for possible genetic problems.  Fetal screens for spina bifida and Down syndrome. HOME CARE INSTRUCTIONS   Avoid all smoking, herbs, alcohol, and unprescribed   drugs. These chemicals affect the formation and growth of the baby.  Follow your health care provider's instructions regarding medicine use. There are medicines that are either safe or unsafe to take during pregnancy.  Exercise only as directed by your health care provider. Experiencing uterine cramps is a good sign to stop exercising.  Continue to eat regular, healthy meals.  Wear a good support bra for breast tenderness.  Do not use hot tubs, steam rooms, or saunas.  Wear your  seat belt at all times when driving.  Avoid raw meat, uncooked cheese, cat litter boxes, and soil used by cats. These carry germs that can cause birth defects in the baby.  Take your prenatal vitamins.  Try taking a stool softener (if your health care provider approves) if you develop constipation. Eat more high-fiber foods, such as fresh vegetables or fruit and whole grains. Drink plenty of fluids to keep your urine clear or pale yellow.  Take warm sitz baths to soothe any pain or discomfort caused by hemorrhoids. Use hemorrhoid cream if your health care provider approves.  If you develop varicose veins, wear support hose. Elevate your feet for 15 minutes, 3-4 times a day. Limit salt in your diet.  Avoid heavy lifting, wear low heel shoes, and practice good posture.  Rest with your legs elevated if you have leg cramps or low back pain.  Visit your dentist if you have not gone yet during your pregnancy. Use a soft toothbrush to brush your teeth and be gentle when you floss.  A sexual relationship may be continued unless your health care provider directs you otherwise.  Continue to go to all your prenatal visits as directed by your health care provider. SEEK MEDICAL CARE IF:   You have dizziness.  You have mild pelvic cramps, pelvic pressure, or nagging pain in the abdominal area.  You have persistent nausea, vomiting, or diarrhea.  You have a bad smelling vaginal discharge.  You have pain with urination. SEEK IMMEDIATE MEDICAL CARE IF:   You have a fever.  You are leaking fluid from your vagina.  You have spotting or bleeding from your vagina.  You have severe abdominal cramping or pain.  You have rapid weight gain or loss.  You have shortness of breath with chest pain.  You notice sudden or extreme swelling of your face, hands, ankles, feet, or legs.  You have not felt your baby move in over an hour.  You have severe headaches that do not go away with  medicine.  You have vision changes. Document Released: 06/10/2001 Document Revised: 06/21/2013 Document Reviewed: 08/17/2012 Exit[employer] Patient Information 2015 [employer]. This information is not intended to replace advice given to you by your health care provider. Make sure you discuss any questions you have with your health care provider.  

## 2014-08-21 NOTE — Progress Notes (Signed)
Provider:  Larey Seat, M D  Referring Provider: Olga Millers, MD Primary Care Physician:  No PCP Per Patient  Chief Complaint  Patient presents with  . Migraine    RM -11 New Patient - Vision 20/200 both eyes W/O-Patient is Pregnant     HPI:  Katrina [name] is a 24 y.o. female , seen here as a referral from Dr. Ouida Sills for migraines during pregnancy,    Katrina [name] is currently pregnant with her third child and in the fourth months of pregnancy. She delivered previously in 2007 a healthy baby at 40 weeks of gas station birth weight was 7 lbs. 14 oz. female and again on in September 2009 at 37 weeks could station a female baby 7 lbs. 0 oz. full term considered full-term. She had no ectopic pregnancies or abortions. Her daughters are in school and her mother has custody over them. She is not around her daughters and not on speaking terms with her mother.   Katrina [name] does not have a long-standing history of headaches and in her previous pregnancies did not experience any migrainous headaches. In December 2014,  she presented to the [facility] emergency room with a headache that she had not experienced until then and was told that she suffered from a migraine - she felt sick for over a week at that time. The Ed physician was  concerned about a possible meningitis and the patient underwent a lumbar puncture. This  Fluid returned negative. Her headaches resolved after over a week. She went to another emergency room in [place] at no violent and obtained a second opinion. She was concerned at the time that she was still nauseated and still vomiting. He had significant neck stiffness still at that time so the meningeal meningeal involvement was still a consideration. She said she had trouble flexing or bending her head. Was diagnosed with a spinal tap headache and a blood patch was performed. She felt better after the blood patch immediately. The patient informed me that her  current pregnancy was not planned, but she did noticed about 2 months ago the onset of migrainous headaches at the end of her second month of pregnancy. She only  found out she was pregnant in the ninth week of gestation. His headaches are a little different from what happened in December 2 years ago. The headaches are so severe that they can wake her out of deep sleep, he has nausea with them, and they happen in daytime she is also extremely nauseated she can for example not drive and her vision is blurred sounds and lights do bother her. They can last from hours to the whole day through. Her headaches occur not unilateral but in both temples and she feels a pressure behind both eyes. There is no aura. The headaches surprise her in the midst of activity or rest. She can sleep them of.   She has tried to ease her pain with benadryl, which makes her sleepy.    There is a maternal history of migraines.    Review of Systems: Out of a complete 14 system review, the patient complains of only the following symptoms, and all other reviewed systems are negative.  photo and phonophobia, blurred vision, nausea, pregnancy migraines.   History   Social History  . Marital Status: Single    Spouse Name: N/A  . Number of Children: 2  . Years of Education: 8 th   Occupational History  .  Unemployed   Social History Main Topics  . Smoking status: Former Smoker -- 0.25 packs/day for 2 years    Types: Cigarettes    Quit date: 05/02/2013  . Smokeless tobacco: Never Used  . Alcohol Use: No     Comment: socially  . Drug Use: No  . Sexual Activity: Yes    Birth Control/ Protection: None   Other Topics Concern  . Not on file   Social History Narrative   Patient is single and lives at home with her family.   Patient is unemployed.   Education 8 th grade.   Right handed.   Caffeine one cup of coffee daily.    Family History  Problem Relation Age of Onset  . Asthma Mother   . Asthma Sister   .  Asthma Sister     Past Medical History  Diagnosis Date  . Asthma   . Herpes simplex of female genitalia     last outbreak 4 months ago  . Migraines   . Vaginal Pap smear, abnormal   . Headache in pregnancy     Past Surgical History  Procedure Laterality Date  . Colposcopy  03/2014    Current Outpatient Prescriptions  Medication Sig Dispense Refill  . albuterol (PROVENTIL HFA;VENTOLIN HFA) 108 (90 BASE) MCG/ACT inhaler Inhale 2 puffs into the lungs every 6 (six) hours as needed for wheezing or shortness of breath.    . butalbital-acetaminophen-caffeine (FIORICET) 50-325-40 MG per tablet Take 1-2 tablets by mouth every 6 (six) hours as needed for headache. 20 tablet 0  . Prenatal Vit-Fe Fumarate-FA (PRENATAL COMPLETE) 14-0.4 MG TABS Take 1 tablet by mouth daily. 30 each 2  . promethazine (PHENERGAN) 25 MG tablet Take 1 tablet (25 mg total) by mouth every 6 (six) hours as needed for nausea or vomiting. 30 tablet 1   No current facility-administered medications for this visit.    Allergies as of 08/21/2014 - Review Complete 08/21/2014  Allergen Reaction Noted  . Shellfish allergy Shortness Of Breath and Swelling 08/25/2012    Vitals: BP 108/65 mmHg  Pulse 76  Resp 18  Ht 5\' 1"  (1.549 m)  Wt 164 lb (74.39 kg)  BMI 31.00 kg/m2  LMP 04/03/2014 Last Weight:  Wt Readings from Last 1 Encounters:  08/21/14 164 lb (74.39 kg)   Last Height:   Ht Readings from Last 1 Encounters:  08/21/14 5\' 1"  (1.549 m)    Physical exam:  General: The patient is awake, alert and appears not in acute distress. The patient is well groomed. Head: Normocephalic, atraumatic. Neck is supple. Mallampati 3 , neck circumference: 14 , no goiter . Cardiovascular:  Regular rate and rhythm, 90 bpm. , without  murmurs or carotid bruit, and without distended neck veins. Respiratory: Lungs are clear to auscultation. Skin:  Without evidence of edema, or rash Trunk:  normal posture.  Neurologic exam : The  patient is awake and alert, oriented to place and time.  Memory subjective  described as intact.  There is a normal attention span & concentration ability. Speech is fluent without  dysarthria, dysphonia or aphasia. Mood and affect are appropriate, a little anxious.  Cranial nerves: Pupils are equal and briskly reactive to light. Funduscopic exam without evidence of pallor or edema.  Extraocular movements  in vertical and horizontal planes intact and without nystagmus.  Visual fields by finger perimetry are intact. Hearing to finger rub intact.  Facial sensation intact to fine touch.  Facial motor strength is symmetric and  tongue and uvula move midline.  Tongue protrusion into either cheek is normal. Shoulder shrug is normal.   Motor exam: Normal tone ,muscle bulk and symmetric  strength in all extremities.  Sensory:  Fine touch, pinprick and vibration and Proprioception : normal.  Coordination: Rapid alternating movements in the fingers/hands were normal.  Finger-to-nose maneuver without evidence of ataxia, dysmetria or tremor.  Gait and station: Patient walks without assistive device and is able unassisted to climb up to the exam table.  Strength within normal limits. Stance is stable and normal. Tandem gait is unfragmented. Romberg testing is negative   Deep tendon reflexes: in the  upper and lower extremities are symmetric and intact. Babinski maneuver  downgoing.   Assessment:  After physical and neurologic examination, review of laboratory studies, imaging, neurophysiology testing and pre-existing records, assessment is that of :  Migraineous vascular headaches in pregnancy, new onset.  Bilateral onset.   Sleep areas in her second trimester of pregnancy but has never had the same symptoms and her previous to uncomplicated pregnancies. I advised her that she is currently retaining a lot of fluid which is expected for pregnancy she also will have an dilutional factor in terms of red  blood cell count so she is typically pregnancy anemic at this point. The risk of a pregnancy related stroke in the second trimester is almost nil. Over 90% of all vascular headaches that are related to a stroke occur in the last month of pregnancy and with in 6 weeks after pregnancy related to lower protein S and other anticoagulant levels. I don't think he needs an MRI at this point for this patient. She appears overall in very good health and I would like to concentrate on treating this migraine preventatively. I would like for her to take some oral magnesium which also helps with vascular headaches plus it helps often with constipation and muscle cramps. I like for her to take Unisom at night to sleep better she can stay to stick with Benadryl if she prefers that and has already purchased it. As to the headache treatment but she is limited to Fioricet or Tylenol 3 at this point for acute migraines.   Phenergan often helps patients with migraines as well she is not allowed at this stage to have tripped times due to the higher risk of spontaneous contractions and vascular contractions.   She is not allowed to have a beta blocker. She may use oral caffeine.   Plan:  Treatment plan and additional workup :  Phenergan for nausea at night,  Unisom for sleep and prevention.          Asencion Partridge Kailin Leu MD 08/21/2014

## 2014-08-22 ENCOUNTER — Inpatient Hospital Stay (HOSPITAL_COMMUNITY)
Admission: AD | Admit: 2014-08-22 | Discharge: 2014-08-22 | Disposition: A | Discharge status: Home / Self Care | Payer: Medicaid Other | Source: Ambulatory Visit | Attending: Obstetrics and Gynecology | Admitting: Obstetrics and Gynecology

## 2014-08-22 ENCOUNTER — Encounter (HOSPITAL_COMMUNITY): Payer: Self-pay

## 2014-08-22 DIAGNOSIS — K2100 Gastro-esophageal reflux disease with esophagitis, without bleeding: Secondary | ICD-10-CM

## 2014-08-22 DIAGNOSIS — O99612 Diseases of the digestive system complicating pregnancy, second trimester: Secondary | ICD-10-CM | POA: Insufficient documentation

## 2014-08-22 DIAGNOSIS — O99342 Other mental disorders complicating pregnancy, second trimester: Secondary | ICD-10-CM | POA: Diagnosis not present

## 2014-08-22 DIAGNOSIS — F419 Anxiety disorder, unspecified: Secondary | ICD-10-CM | POA: Diagnosis not present

## 2014-08-22 DIAGNOSIS — Z3A17 17 weeks gestation of pregnancy: Secondary | ICD-10-CM | POA: Diagnosis not present

## 2014-08-22 DIAGNOSIS — Z87891 Personal history of nicotine dependence: Secondary | ICD-10-CM | POA: Diagnosis not present

## 2014-08-22 DIAGNOSIS — R079 Chest pain, unspecified: Secondary | ICD-10-CM | POA: Diagnosis present

## 2014-08-22 HISTORY — DX: Migraine, unspecified, not intractable, without status migrainosus: G43.909

## 2014-08-22 MED ORDER — GI COCKTAIL ~~LOC~~
30.0000 mL | Freq: Once | ORAL | Status: AC
  Administered 2014-08-22: 30 mL via ORAL
  Filled 2014-08-22: qty 30

## 2014-08-22 NOTE — MAU Provider Note (Signed)
History     CSN: [id number]  Arrival date and time: 08/22/14 9562   First Provider Initiated Contact with Patient 08/22/14 [phone]      Chief Complaint  Patient presents with  . Chest Pain  . Abdominal Cramping   HPI  Katrina [name] is a 24 y.o. G3P2002 at [redacted]w[redacted]d who presents today with chest tightening and abdominal tightening. She states that she woke up with this feeling around 0300. She states that she thought she was having a panic attack, and she tried to take a warm shower, and calm herself down. However, she was unable to sleep. She states that she does not usually take anything for her anxiety or panic attacks. She states that she took phenergan last night at 2300. She states that she has an appointment later today.   Past Medical History  Diagnosis Date  . Asthma   . Herpes simplex of female genitalia     last outbreak 4 months ago  . Migraines   . Vaginal Pap smear, abnormal   . Headache in pregnancy     Past Surgical History  Procedure Laterality Date  . Colposcopy  03/2014    Family History  Problem Relation Age of Onset  . Asthma Mother   . Asthma Sister   . Asthma Sister     History  Substance Use Topics  . Smoking status: Former Smoker -- 0.25 packs/day for 2 years    Types: Cigarettes    Quit date: 05/02/2013  . Smokeless tobacco: Never Used  . Alcohol Use: No     Comment: socially    Allergies:  Allergies  Allergen Reactions  . Shellfish Allergy Shortness Of Breath and Swelling    Prescriptions prior to admission  Medication Sig Dispense Refill Last Dose  . albuterol (PROVENTIL HFA;VENTOLIN HFA) 108 (90 BASE) MCG/ACT inhaler Inhale 2 puffs into the lungs every 6 (six) hours as needed for wheezing or shortness of breath.   Taking  . butalbital-acetaminophen-caffeine (FIORICET) 50-325-40 MG per tablet Take 1-2 tablets by mouth every 6 (six) hours as needed for headache. 20 tablet 0 Taking  . doxylamine, Sleep, (UNISOM) 25 MG tablet Take 1  tablet (25 mg total) by mouth at bedtime as needed. 30 tablet 0   . Prenatal Vit-Fe Fumarate-FA (PRENATAL COMPLETE) 14-0.4 MG TABS Take 1 tablet by mouth daily. 30 each 2 Taking  . promethazine (PHENERGAN) 25 MG tablet Take 1 tablet (25 mg total) by mouth every 6 (six) hours as needed for nausea or vomiting. 30 tablet 1     ROS Physical Exam   Blood pressure 125/67, pulse 81, temperature 98.4 F (36.9 C), temperature source Oral, resp. rate 18, height 5\' 1"  (1.549 m), weight 74.447 kg (164 lb 2 oz), last menstrual period 04/03/2014, SpO2 100 %.  Physical Exam  Nursing note and vitals reviewed. Constitutional: She is oriented to person, place, and time. She appears well-developed and well-nourished. No distress.  Cardiovascular: Normal rate.   Respiratory: Effort normal.  GI: Soft. There is no tenderness. There is no rebound.  Neurological: She is alert and oriented to person, place, and time.  Skin: Skin is warm and dry.  Psychiatric: She has a normal mood and affect.    MAU Course  Procedures  0703: D/W Dr. Harrington Challenger, will start with GI cocktail at this time, and if no improvement consider EKG.  0801: Patient reports that the tightening and discomfort is gone now. She rates her pain 0/10.   Assessment and  Plan   1. Gastroesophageal reflux disease with esophagitis   2. Anxiety    DC home Return precautions reviewed Return to MAU as needed  Follow-up Information    Follow up with Marcial Pacas., MD.   Specialty:  Obstetrics and Gynecology   Why:  As scheduled for later today    Contact information:   [address] [place] [address] [phone]        Mathis Bud 08/22/2014, 6:50 AM

## 2014-08-22 NOTE — MAU Note (Signed)
Pt states that she woke up a couple of hours ago with chest tightning. To calm herself down, she took a shower and then lay down but then started to feel her stomach tighten up. Pt has an appointment this morning with Dr. Ouida Sills but could not wait. Pt denies leaking of fluid and bleeding.

## 2014-08-22 NOTE — Discharge Instructions (Signed)
Generalized Anxiety Disorder Generalized anxiety disorder (GAD) is a mental disorder. It interferes with life functions, including relationships, work, and school. GAD is different from normal anxiety, which everyone experiences at some point in their lives in response to specific life events and activities. Normal anxiety actually helps us prepare for and get through these life events and activities. Normal anxiety goes away after the event or activity is over.  GAD causes anxiety that is not necessarily related to specific events or activities. It also causes excess anxiety in proportion to specific events or activities. The anxiety associated with GAD is also difficult to control. GAD can vary from mild to severe. People with severe GAD can have intense waves of anxiety with physical symptoms (panic attacks).  SYMPTOMS The anxiety and worry associated with GAD are difficult to control. This anxiety and worry are related to many life events and activities and also occur more days than not for 6 months or longer. People with GAD also have three or more of the following symptoms (one or more in children):  Restlessness.   Fatigue.  Difficulty concentrating.   Irritability.  Muscle tension.  Difficulty sleeping or unsatisfying sleep. DIAGNOSIS GAD is diagnosed through an assessment by your health care provider. Your health care provider will ask you questions aboutyour mood,physical symptoms, and events in your life. Your health care provider may ask you about your medical history and use of alcohol or drugs, including prescription medicines. Your health care provider may also do a physical exam and blood tests. Certain medical conditions and the use of certain substances can cause symptoms similar to those associated with GAD. Your health care provider may refer you to a mental health specialist for further evaluation. TREATMENT The following therapies are usually used to treat GAD:    Medication. Antidepressant medication usually is prescribed for long-term daily control. Antianxiety medicines may be added in severe cases, especially when panic attacks occur.   Talk therapy (psychotherapy). Certain types of talk therapy can be helpful in treating GAD by providing support, education, and guidance. A form of talk therapy called cognitive behavioral therapy can teach you healthy ways to think about and react to daily life events and activities.  Stress managementtechniques. These include yoga, meditation, and exercise and can be very helpful when they are practiced regularly. A mental health specialist can help determine which treatment is best for you. Some people see improvement with one therapy. However, other people require a combination of therapies. Document Released: 10/11/2012 Document Revised: 10/31/2013 Document Reviewed: 10/11/2012 Exit[employer] Patient Information 2015 [employer]. This information is not intended to replace advice given to you by your health care provider. Make sure you discuss any questions you have with your health care provider.  

## 2014-08-22 NOTE — MAU Note (Signed)
Marcille Buffy CNM to come to unit and assess patient.

## 2014-08-22 NOTE — MAU Note (Signed)
Pt states that she was seen at doctors yesterday and was told she had a UTI and started on Macrobid. Pt has not started on Macrobid yet. Pt admits that she has not had good sleep and is anxious about that. Pt wonders if this anxious feeling is causing chest tightness.

## 2014-09-04 ENCOUNTER — Other Ambulatory Visit: Payer: Self-pay | Admitting: Obstetrics and Gynecology

## 2014-09-27 ENCOUNTER — Emergency Department (HOSPITAL_COMMUNITY)
Admission: EM | Admit: 2014-09-27 | Discharge: 2014-09-28 | Disposition: A | Discharge status: Home / Self Care | Payer: Medicaid Other | Attending: Emergency Medicine | Admitting: Emergency Medicine

## 2014-09-27 ENCOUNTER — Encounter (HOSPITAL_COMMUNITY): Payer: Self-pay | Admitting: *Deleted

## 2014-09-27 DIAGNOSIS — Z87891 Personal history of nicotine dependence: Secondary | ICD-10-CM | POA: Diagnosis not present

## 2014-09-27 DIAGNOSIS — Z3A23 23 weeks gestation of pregnancy: Secondary | ICD-10-CM | POA: Diagnosis not present

## 2014-09-27 DIAGNOSIS — O99352 Diseases of the nervous system complicating pregnancy, second trimester: Secondary | ICD-10-CM | POA: Insufficient documentation

## 2014-09-27 DIAGNOSIS — J45901 Unspecified asthma with (acute) exacerbation: Secondary | ICD-10-CM | POA: Diagnosis not present

## 2014-09-27 DIAGNOSIS — Z79899 Other long term (current) drug therapy: Secondary | ICD-10-CM | POA: Insufficient documentation

## 2014-09-27 DIAGNOSIS — G43909 Migraine, unspecified, not intractable, without status migrainosus: Secondary | ICD-10-CM | POA: Diagnosis not present

## 2014-09-27 DIAGNOSIS — Z8619 Personal history of other infectious and parasitic diseases: Secondary | ICD-10-CM | POA: Insufficient documentation

## 2014-09-27 DIAGNOSIS — O99512 Diseases of the respiratory system complicating pregnancy, second trimester: Secondary | ICD-10-CM | POA: Insufficient documentation

## 2014-09-27 NOTE — ED Provider Notes (Signed)
CSN: [id number]     Arrival date & time 09/27/14  2325 History   First MD Initiated Contact with Patient 09/27/14 2354     This chart was scribed for Katrina Pert, MD by Katrina [name], ED Scribe. This patient was seen in room A08C/A08C and the patient's care was started 12:32 AM.   Chief Complaint  Patient presents with  . Shortness of Breath   Patient is a 24 y.o. female presenting with shortness of breath. The history is provided by the patient. No language interpreter was used.  Shortness of Breath Severity:  Mild Onset quality:  Gradual Duration:  4 days Timing:  Constant Progression:  Unchanged Chronicity:  Recurrent Context: not known allergens and not pollens   Relieved by:  Nothing Worsened by:  Nothing tried Ineffective treatments:  Inhaler Associated symptoms: no abdominal pain, no chest pain, no cough, no fever, no headaches, no neck pain, no rash, no sore throat and no vomiting     HPI Comments: Katrina [name] currently [redacted] weeks gestation is a 24 y.o. female with a PMHx of asthma and migraines who presents to the Emergency Department complaining of gradual onset, ongoing, mild shortness of breath x 4 days. Pt attributes symptoms to asthma flare up. She reports 2 previous flare ups requiring her to come to the emergency department. However, she does not associate flare up with recent weather change or allergens. Pt recently ran out of her rescue inhaler this morning. As of Saturday 3/26 pt had 72 puffs left in her inhaler but has used all puffs as symptoms have worsened in last few days. [name] has also been using OTC Benadryl at bed time. No recent chest pain, fever, or chills. No recent complications with pregnancy. No known allergies to medications.  Past Medical History  Diagnosis Date  . Asthma   . Herpes simplex of female genitalia     last outbreak 4 months ago  . Migraines   . Vaginal Pap smear, abnormal   . Headache in pregnancy   . Migraine    Past  Surgical History  Procedure Laterality Date  . Colposcopy  03/2014   Family History  Problem Relation Age of Onset  . Asthma Mother   . Asthma Sister   . Asthma Sister    History  Substance Use Topics  . Smoking status: Former Smoker -- 0.25 packs/day for 2 years    Types: Cigarettes    Quit date: 05/02/2013  . Smokeless tobacco: Never Used  . Alcohol Use: No     Comment: socially   OB History    Gravida Para Term Preterm AB TAB SAB Ectopic Multiple Living   3 2 2  0 0 0 0 0 0 2     Review of Systems  Constitutional: Negative for fever and chills.  HENT: Negative for rhinorrhea and sore throat.   Eyes: Negative for visual disturbance.  Respiratory: Positive for shortness of breath. Negative for cough.   Cardiovascular: Negative for chest pain and leg swelling.  Gastrointestinal: Negative for nausea, vomiting, abdominal pain and diarrhea.  Genitourinary: Negative for dysuria.  Musculoskeletal: Negative for back pain and neck pain.  Skin: Negative for rash.  Neurological: Negative for dizziness, light-headedness and headaches.  Hematological: Does not bruise/bleed easily.  Psychiatric/Behavioral: Negative for confusion.      Allergies  Shellfish allergy  Home Medications   Prior to Admission medications   Medication Sig Start Date End Date Taking? Authorizing Provider  albuterol (PROVENTIL HFA;VENTOLIN HFA)  108 (90 BASE) MCG/ACT inhaler Inhale 2 puffs into the lungs every 6 (six) hours as needed for wheezing or shortness of breath.    Historical Provider, MD  butalbital-acetaminophen-caffeine (FIORICET) 50-325-40 MG per tablet Take 1-2 tablets by mouth every 6 (six) hours as needed for headache. 08/02/14 08/02/15  Katrina Redden, PA-C  DiphenhydrAMINE HCl (BENADRYL PO) Take 1 tablet by mouth at bedtime as needed (sleep).    Historical Provider, MD  doxylamine, Sleep, (UNISOM) 25 MG tablet Take 1 tablet (25 mg total) by mouth at bedtime as needed. 08/21/14   Katrina Seat,  MD  Prenatal Vit-Fe Fumarate-FA (PRENATAL COMPLETE) 14-0.4 MG TABS Take 1 tablet by mouth daily. 05/31/14   Katrina Lye, NP  promethazine (PHENERGAN) 25 MG tablet Take 1 tablet (25 mg total) by mouth every 6 (six) hours as needed for nausea or vomiting. 08/21/14   Katrina Seat, MD   Triage Vitals: BP 112/68 mmHg  Pulse 80  Temp(Src) 98 F (36.7 C) (Oral)  Resp 18  Ht 5\' 1"  (1.549 m)  Wt 162 lb (73.483 kg)  BMI 30.63 kg/m2  SpO2 97%  LMP 04/03/2014   Physical Exam  Constitutional: She is oriented to person, place, and time. She appears well-developed and well-nourished. No distress.  HENT:  Head: Normocephalic and atraumatic.  Right Ear: Hearing normal.  Left Ear: Hearing normal.  Nose: Nose normal.  Mouth/Throat: Oropharynx is clear and moist and mucous membranes are normal.  Eyes: Conjunctivae and EOM are normal. Pupils are equal, round, and reactive to light.  Neck: Normal range of motion. Neck supple.  Cardiovascular: Normal rate, regular rhythm, S1 normal and S2 normal.  Exam reveals no gallop and no friction rub.   No murmur heard. Pulmonary/Chest: Effort normal and breath sounds normal. No respiratory distress. She exhibits no tenderness.  Abdominal: Soft. Normal appearance and bowel sounds are normal. There is no hepatosplenomegaly. There is no tenderness. There is no rebound, no guarding, no tenderness at McBurney's point and negative Murphy's sign. No hernia.  Musculoskeletal: Normal range of motion. She exhibits no edema or tenderness.  Symmetric lower extremities without tenderness  Neurological: She is alert and oriented to person, place, and time. She has normal strength. No cranial nerve deficit or sensory deficit. Coordination normal. GCS eye subscore is 4. GCS verbal subscore is 5. GCS motor subscore is 6.  Skin: Skin is warm, dry and intact. No rash noted. No cyanosis.  Psychiatric: She has a normal mood and affect. Her speech is normal and behavior is normal.  Thought content normal.  Nursing note and vitals reviewed.   ED Course  Procedures (including critical care time)  DIAGNOSTIC STUDIES: Oxygen Saturation is 97% on RA, adequate by my interpretation.    COORDINATION OF CARE: 11:56 PM-Discussed treatment plan with pt at bedside and pt agreed to plan.     Labs Review Labs Reviewed - No data to display  Imaging Review No results found.   EKG Interpretation None      MDM   Final diagnoses:  Asthma exacerbation    7:12 AM 24 y.o. female here with complain of increased frequency of wheezing recently. She ran out of her inhaler this morning. She is afebrile and vital signs are unremarkable here. She states that she is [redacted] weeks pregnant. She denies any vaginal bleeding, abdominal pain, or loss of fluid. Lungs are clear to auscultation bilaterally on my exam. We'll provide a prescription for inhaler and defer prednisone Rx to her OB/GYN  if needed.   I personally performed the services described in this documentation, which was scribed in my presence. The recorded information has been reviewed and is accurate.    Katrina Pert, MD 09/28/14 [phone]

## 2014-09-27 NOTE — ED Notes (Signed)
Pt c/o asthma and allergies since Saturday. States that she has used 72 puffs of her inhaler since Saturday and now needs a refill. States that she is pregnant - 5 months along.

## 2014-09-28 MED ORDER — ALBUTEROL SULFATE HFA 108 (90 BASE) MCG/ACT IN AERS: 1.0000 | INHALATION_SPRAY | Freq: Four times a day (QID) | RESPIRATORY_TRACT | Status: DC | PRN

## 2014-09-28 MED ORDER — ALBUTEROL SULFATE HFA 108 (90 BASE) MCG/ACT IN AERS
2.0000 | INHALATION_SPRAY | Freq: Once | RESPIRATORY_TRACT | Status: AC
  Administered 2014-09-28: 2 via RESPIRATORY_TRACT
  Filled 2014-09-28: qty 6.7

## 2014-09-28 NOTE — Discharge Instructions (Signed)

## 2014-10-16 ENCOUNTER — Inpatient Hospital Stay (HOSPITAL_COMMUNITY)
Admission: AD | Admit: 2014-10-16 | Discharge: 2014-10-16 | Disposition: A | Discharge status: Home / Self Care | Payer: Medicaid Other | Source: Ambulatory Visit | Attending: Obstetrics and Gynecology | Admitting: Obstetrics and Gynecology

## 2014-10-16 ENCOUNTER — Encounter (HOSPITAL_COMMUNITY): Payer: Self-pay | Admitting: *Deleted

## 2014-10-16 ENCOUNTER — Inpatient Hospital Stay (HOSPITAL_COMMUNITY): Payer: Medicaid Other

## 2014-10-16 DIAGNOSIS — O9989 Other specified diseases and conditions complicating pregnancy, childbirth and the puerperium: Secondary | ICD-10-CM | POA: Diagnosis not present

## 2014-10-16 DIAGNOSIS — Y93E5 Activity, floor mopping and cleaning: Secondary | ICD-10-CM | POA: Diagnosis not present

## 2014-10-16 DIAGNOSIS — O26893 Other specified pregnancy related conditions, third trimester: Secondary | ICD-10-CM | POA: Insufficient documentation

## 2014-10-16 DIAGNOSIS — R102 Pelvic and perineal pain: Secondary | ICD-10-CM | POA: Insufficient documentation

## 2014-10-16 DIAGNOSIS — Z3A25 25 weeks gestation of pregnancy: Secondary | ICD-10-CM | POA: Diagnosis not present

## 2014-10-16 DIAGNOSIS — Z6791 Unspecified blood type, Rh negative: Secondary | ICD-10-CM | POA: Diagnosis not present

## 2014-10-16 DIAGNOSIS — Z87891 Personal history of nicotine dependence: Secondary | ICD-10-CM | POA: Diagnosis not present

## 2014-10-16 DIAGNOSIS — W010XXA Fall on same level from slipping, tripping and stumbling without subsequent striking against object, initial encounter: Secondary | ICD-10-CM | POA: Insufficient documentation

## 2014-10-16 DIAGNOSIS — O4692 Antepartum hemorrhage, unspecified, second trimester: Secondary | ICD-10-CM | POA: Diagnosis not present

## 2014-10-16 LAB — URINALYSIS, ROUTINE W REFLEX MICROSCOPIC
Bilirubin Urine: NEGATIVE
GLUCOSE, UA: NEGATIVE mg/dL
Ketones, ur: NEGATIVE mg/dL
Nitrite: NEGATIVE
PROTEIN: NEGATIVE mg/dL
Specific Gravity, Urine: 1.025 (ref 1.005–1.030)
Urobilinogen, UA: 0.2 mg/dL (ref 0.0–1.0)
pH: 6.5 (ref 5.0–8.0)

## 2014-10-16 LAB — URINE MICROSCOPIC-ADD ON

## 2014-10-16 MED ORDER — RHO D IMMUNE GLOBULIN 1500 UNIT/2ML IJ SOSY
300.0000 ug | PREFILLED_SYRINGE | Freq: Once | INTRAMUSCULAR | Status: AC
  Administered 2014-10-16: 300 ug via INTRAMUSCULAR
  Filled 2014-10-16: qty 2

## 2014-10-16 MED ORDER — CYCLOBENZAPRINE HCL 10 MG PO TABS
10.0000 mg | ORAL_TABLET | Freq: Once | ORAL | Status: AC
  Administered 2014-10-16: 10 mg via ORAL
  Filled 2014-10-16: qty 1

## 2014-10-16 NOTE — MAU Provider Note (Signed)
History     CSN: [id number]  Arrival date and time: 10/16/14 0021   First Provider Initiated Contact with Patient 10/16/14 0111      Chief Complaint  Patient presents with  . Fall  . Contractions   HPI  [name] is a 24 y.o. G3P2002 at [redacted]w[redacted]d here with report of lower pelvic pressure for "weeks".  Concerned due to falling yesterday while moping.  Slipped and landed on bottom and back.  Reports spotting x two today.  Also reports contractions that started two days ago, but has increased in intensity since the fall.    Patient states having a "'previa".  Upon review of the records there was a posterior low lying placenta, 1 cm from os diagnosed at 16 weeks.     Past Medical History  Diagnosis Date  . Asthma   . Herpes simplex of female genitalia     last outbreak 4 months ago  . Migraines   . Vaginal Pap smear, abnormal   . Headache in pregnancy   . Migraine     Past Surgical History  Procedure Laterality Date  . Colposcopy  03/2014    Family History  Problem Relation Age of Onset  . Asthma Mother   . Asthma Sister   . Asthma Sister     History  Substance Use Topics  . Smoking status: Former Smoker -- 0.25 packs/day for 2 years    Types: Cigarettes    Quit date: 05/02/2013  . Smokeless tobacco: Never Used  . Alcohol Use: No     Comment: socially    Allergies:  Allergies  Allergen Reactions  . Shellfish Allergy Shortness Of Breath and Swelling    Prescriptions prior to admission  Medication Sig Dispense Refill Last Dose  . albuterol (PROVENTIL HFA;VENTOLIN HFA) 108 (90 BASE) MCG/ACT inhaler Inhale 1-2 puffs into the lungs every 6 (six) hours as needed for wheezing or shortness of breath. 1 Inhaler 1 10/15/2014 at Unknown time  . butalbital-acetaminophen-caffeine (FIORICET) 50-325-40 MG per tablet Take 1-2 tablets by mouth every 6 (six) hours as needed for headache. 20 tablet 0 10/15/2014 at Unknown time  . DiphenhydrAMINE HCl (BENADRYL PO) Take 1  tablet by mouth at bedtime as needed (sleep).   Past Week at Unknown time  . doxylamine, Sleep, (UNISOM) 25 MG tablet Take 1 tablet (25 mg total) by mouth at bedtime as needed. 30 tablet 0 Past Month at Unknown time  . Prenatal Vit-Fe Fumarate-FA (PRENATAL COMPLETE) 14-0.4 MG TABS Take 1 tablet by mouth daily. 30 each 2 10/15/2014 at Unknown time  . promethazine (PHENERGAN) 25 MG tablet Take 1 tablet (25 mg total) by mouth every 6 (six) hours as needed for nausea or vomiting. 30 tablet 1 10/15/2014 at Unknown time  . valACYclovir (VALTREX) 500 MG tablet Take 500 mg by mouth 2 (two) times daily.   10/15/2014 at Unknown time  . albuterol (PROVENTIL HFA;VENTOLIN HFA) 108 (90 BASE) MCG/ACT inhaler Inhale 2 puffs into the lungs every 6 (six) hours as needed for wheezing or shortness of breath.   Past Week at Unknown time    Review of Systems  Gastrointestinal: Positive for abdominal pain (contractions).  Genitourinary: Negative for dysuria and urgency.       Spotting of vaginal blood  Musculoskeletal: Positive for myalgias. Negative for back pain.  All other systems reviewed and are negative.  Physical Exam   Blood pressure 115/71, pulse 83, temperature 98.1 F (36.7 C), temperature source Oral, resp.  rate 18, height 5\' 1"  (1.549 m), weight 75.841 kg (167 lb 3.2 oz), last menstrual period 04/03/2014.  Physical Exam  Constitutional: She is oriented to person, place, and time. She appears well-developed and well-nourished. No distress.  HENT:  Head: Normocephalic.  Neck: Normal range of motion. Neck supple.  Cardiovascular: Normal rate, regular rhythm and normal heart sounds.   Respiratory: Effort normal and breath sounds normal.  GI: Soft. There is no tenderness.  Genitourinary: No bleeding in the vagina.  Neurological: She is alert and oriented to person, place, and time.  Skin: Skin is warm and dry.   Cervical exam completed after ultrasound:  Closed/thick; no bleeding noted.   MAU Course   Procedures  . Results for orders placed or performed during the hospital encounter of 10/16/14 (from the past 24 hour(s))  Urinalysis, Routine w reflex microscopic     Status: Abnormal   Collection Time: 10/16/14 12:35 AM  Result Value Ref Range   Color, Urine YELLOW YELLOW   APPearance CLEAR CLEAR   Specific Gravity, Urine 1.025 1.005 - 1.030   pH 6.5 5.0 - 8.0   Glucose, UA NEGATIVE NEGATIVE mg/dL   Hgb urine dipstick TRACE (A) NEGATIVE   Bilirubin Urine NEGATIVE NEGATIVE   Ketones, ur NEGATIVE NEGATIVE mg/dL   Protein, ur NEGATIVE NEGATIVE mg/dL   Urobilinogen, UA 0.2 0.0 - 1.0 mg/dL   Nitrite NEGATIVE NEGATIVE   Leukocytes, UA TRACE (A) NEGATIVE  Urine microscopic-add on     Status: Abnormal   Collection Time: 10/16/14 12:35 AM  Result Value Ref Range   Squamous Epithelial / LPF RARE RARE   WBC, UA 0-2 <3 WBC/hpf   RBC / HPF 3-6 <3 RBC/hpf   Bacteria, UA FEW (A) RARE   FHR 130's, +accels Toco - irregular   Ultrasound: Preliminary report Posterior placenta, above the os No evidence of previa or abruption Cervical length:  4.1 cm  0225 Consult with Dr. Harrington Challenger > reviewed HPI/exam/ultrasound > discharge to home with precautions > follow-up at previously scheduled appt  Patient RH neg > received Rhophylac prior to discharge.   Assessment and Plan  24 y.o. G3P2002 at [redacted]w[redacted]d IUP Fall in Pregnancy RH negative Reactive NST  Plan: Discharge to home Keep scheduled appointment Follow-up sooner if vaginal bleeding or abdominal pain worsens  KARIM, [facility] N 10/16/2014, 1:14 AM

## 2014-10-16 NOTE — MAU Note (Signed)
Pt stated she was cleaning the floor yesterday and slipped on her bottom. Has been having back pain and cramping ever since. Has gotten a little worse of the course of the day. C/o increased pelvic pressure as well. Good fetal movement reported. Reports some light pinkish discharge as well.

## 2014-10-16 NOTE — MAU Note (Signed)
Pt states she has had pelvic pressure for the past 3-4 days even prior to the fall.  Reports she had sex April 15th and that walking makes her pelvic pressure worse.

## 2014-10-17 LAB — RH IG WORKUP (INCLUDES ABO/RH)
ABO/RH(D): O NEG
Antibody Screen: NEGATIVE
Fetal Screen: NEGATIVE
GESTATIONAL AGE(WKS): 25.2
UNIT DIVISION: 0

## 2014-11-16 ENCOUNTER — Inpatient Hospital Stay (HOSPITAL_COMMUNITY)
Admission: AD | Admit: 2014-11-16 | Discharge: 2014-11-16 | Disposition: A | Discharge status: Home / Self Care | Payer: Medicaid Other | Source: Ambulatory Visit | Attending: Obstetrics & Gynecology | Admitting: Obstetrics & Gynecology

## 2014-11-16 ENCOUNTER — Inpatient Hospital Stay (HOSPITAL_COMMUNITY): Payer: Medicaid Other

## 2014-11-16 ENCOUNTER — Encounter (HOSPITAL_COMMUNITY): Payer: Self-pay | Admitting: *Deleted

## 2014-11-16 DIAGNOSIS — Z3A29 29 weeks gestation of pregnancy: Secondary | ICD-10-CM | POA: Diagnosis not present

## 2014-11-16 DIAGNOSIS — Z87891 Personal history of nicotine dependence: Secondary | ICD-10-CM | POA: Insufficient documentation

## 2014-11-16 DIAGNOSIS — O4693 Antepartum hemorrhage, unspecified, third trimester: Secondary | ICD-10-CM | POA: Insufficient documentation

## 2014-11-16 DIAGNOSIS — O468X3 Other antepartum hemorrhage, third trimester: Secondary | ICD-10-CM | POA: Diagnosis not present

## 2014-11-16 DIAGNOSIS — N939 Abnormal uterine and vaginal bleeding, unspecified: Secondary | ICD-10-CM

## 2014-11-16 LAB — URINE MICROSCOPIC-ADD ON

## 2014-11-16 LAB — URINALYSIS, ROUTINE W REFLEX MICROSCOPIC
BILIRUBIN URINE: NEGATIVE
Glucose, UA: NEGATIVE mg/dL
KETONES UR: NEGATIVE mg/dL
Nitrite: NEGATIVE
PROTEIN: NEGATIVE mg/dL
Specific Gravity, Urine: 1.02 (ref 1.005–1.030)
UROBILINOGEN UA: 0.2 mg/dL (ref 0.0–1.0)
pH: 7 (ref 5.0–8.0)

## 2014-11-16 NOTE — MAU Note (Signed)
Pt reports she started having vag bleeding today. C/o back pain and reports fetal movement less than usual.

## 2014-11-16 NOTE — Discharge Instructions (Signed)
Fetal Movement Counts Patient Name: __________________________________________________ Patient Due Date: ____________________ Performing a fetal movement count is highly recommended in high-risk pregnancies, but it is good for every pregnant woman to do. Your health care provider may ask you to start counting fetal movements at 28 weeks of the pregnancy. Fetal movements often increase:  After eating a full meal.  After physical activity.  After eating or drinking something sweet or cold.  At rest. Pay attention to when you feel the baby is most active. This will help you notice a pattern of your baby's sleep and wake cycles and what factors contribute to an increase in fetal movement. It is important to perform a fetal movement count at the same time each day when your baby is normally most active.  HOW TO COUNT FETAL MOVEMENTS  Find a quiet and comfortable area to sit or lie down on your left side. Lying on your left side provides the best blood and oxygen circulation to your baby.  Write down the day and time on a sheet of paper or in a journal.  Start counting kicks, flutters, swishes, rolls, or jabs in a 2-hour period. You should feel at least 10 movements within 2 hours.  If you do not feel 10 movements in 2 hours, wait 2-3 hours and count again. Look for a change in the pattern or not enough counts in 2 hours. SEEK MEDICAL CARE IF:  You feel less than 10 counts in 2 hours, tried twice.  There is no movement in over an hour.  The pattern is changing or taking longer each day to reach 10 counts in 2 hours.  You feel the baby is not moving as he or she usually does. Date: ____________ Movements: ____________ Start time: ____________ Elizebeth Koller time: ____________  Date: ____________ Movements: ____________ Start time: ____________ Elizebeth Koller time: ____________ Date: ____________ Movements: ____________ Start time: ____________ Elizebeth Koller time: ____________ Date: ____________ Movements:  ____________ Start time: ____________ Elizebeth Koller time: ____________ Date: ____________ Movements: ____________ Start time: ____________ Elizebeth Koller time: ____________ Date: ____________ Movements: ____________ Start time: ____________ Elizebeth Koller time: ____________ Date: ____________ Movements: ____________ Start time: ____________ Elizebeth Koller time: ____________ Date: ____________ Movements: ____________ Start time: ____________ Elizebeth Koller time: ____________  Date: ____________ Movements: ____________ Start time: ____________ Elizebeth Koller time: ____________ Date: ____________ Movements: ____________ Start time: ____________ Elizebeth Koller time: ____________ Date: ____________ Movements: ____________ Start time: ____________ Elizebeth Koller time: ____________ Date: ____________ Movements: ____________ Start time: ____________ Elizebeth Koller time: ____________ Date: ____________ Movements: ____________ Start time: ____________ Elizebeth Koller time: ____________ Date: ____________ Movements: ____________ Start time: ____________ Elizebeth Koller time: ____________ Date: ____________ Movements: ____________ Start time: ____________ Elizebeth Koller time: ____________  Date: ____________ Movements: ____________ Start time: ____________ Elizebeth Koller time: ____________ Date: ____________ Movements: ____________ Start time: ____________ Elizebeth Koller time: ____________ Date: ____________ Movements: ____________ Start time: ____________ Elizebeth Koller time: ____________ Date: ____________ Movements: ____________ Start time: ____________ Elizebeth Koller time: ____________ Date: ____________ Movements: ____________ Start time: ____________ Elizebeth Koller time: ____________ Date: ____________ Movements: ____________ Start time: ____________ Elizebeth Koller time: ____________ Date: ____________ Movements: ____________ Start time: ____________ Elizebeth Koller time: ____________  Date: ____________ Movements: ____________ Start time: ____________ Elizebeth Koller time: ____________ Date: ____________ Movements: ____________ Start time: ____________ Elizebeth Koller  time: ____________ Date: ____________ Movements: ____________ Start time: ____________ Elizebeth Koller time: ____________ Date: ____________ Movements: ____________ Start time: ____________ Elizebeth Koller time: ____________ Date: ____________ Movements: ____________ Start time: ____________ Elizebeth Koller time: ____________ Date: ____________ Movements: ____________ Start time: ____________ Elizebeth Koller time: ____________ Date: ____________ Movements: ____________ Start time: ____________ Elizebeth Koller time: ____________  Date: ____________ Movements: ____________ Start time: ____________ Elizebeth Koller  time: ____________ Date: ____________ Movements: ____________ Start time: ____________ Elizebeth Koller time: ____________ Date: ____________ Movements: ____________ Start time: ____________ Elizebeth Koller time: ____________ Date: ____________ Movements: ____________ Start time: ____________ Elizebeth Koller time: ____________ Date: ____________ Movements: ____________ Start time: ____________ Elizebeth Koller time: ____________ Date: ____________ Movements: ____________ Start time: ____________ Elizebeth Koller time: ____________ Date: ____________ Movements: ____________ Start time: ____________ Elizebeth Koller time: ____________  Date: ____________ Movements: ____________ Start time: ____________ Elizebeth Koller time: ____________ Date: ____________ Movements: ____________ Start time: ____________ Elizebeth Koller time: ____________ Date: ____________ Movements: ____________ Start time: ____________ Elizebeth Koller time: ____________ Date: ____________ Movements: ____________ Start time: ____________ Elizebeth Koller time: ____________ Date: ____________ Movements: ____________ Start time: ____________ Elizebeth Koller time: ____________ Date: ____________ Movements: ____________ Start time: ____________ Elizebeth Koller time: ____________ Date: ____________ Movements: ____________ Start time: ____________ Elizebeth Koller time: ____________  Date: ____________ Movements: ____________ Start time: ____________ Elizebeth Koller time: ____________ Date: ____________  Movements: ____________ Start time: ____________ Elizebeth Koller time: ____________ Date: ____________ Movements: ____________ Start time: ____________ Elizebeth Koller time: ____________ Date: ____________ Movements: ____________ Start time: ____________ Elizebeth Koller time: ____________ Date: ____________ Movements: ____________ Start time: ____________ Elizebeth Koller time: ____________ Date: ____________ Movements: ____________ Start time: ____________ Elizebeth Koller time: ____________ Date: ____________ Movements: ____________ Start time: ____________ Elizebeth Koller time: ____________  Date: ____________ Movements: ____________ Start time: ____________ Elizebeth Koller time: ____________ Date: ____________ Movements: ____________ Start time: ____________ Elizebeth Koller time: ____________ Date: ____________ Movements: ____________ Start time: ____________ Elizebeth Koller time: ____________ Date: ____________ Movements: ____________ Start time: ____________ Elizebeth Koller time: ____________ Date: ____________ Movements: ____________ Start time: ____________ Elizebeth Koller time: ____________ Date: ____________ Movements: ____________ Start time: ____________ Elizebeth Koller time: ____________ Document Released: 07/16/2006 Document Revised: 10/31/2013 Document Reviewed: 04/12/2012 [employer] Patient Information 2015 [employer]. This information is not intended to replace advice given to you by your health care provider. Make sure you discuss any questions you have with your health care provider. Third Trimester of Pregnancy The third trimester is from week 29 through week 42, months 7 through 9. The third trimester is a time when the fetus is growing rapidly. At the end of the ninth month, the fetus is about 20 inches in length and weighs 6-10 pounds.  BODY CHANGES Your body goes through many changes during pregnancy. The changes vary from woman to woman.   Your weight will continue to increase. You can expect to gain 25-35 pounds (11-16 kg) by the end of the pregnancy.  You may begin to get  stretch marks on your hips, abdomen, and breasts.  You may urinate more often because the fetus is moving lower into your pelvis and pressing on your bladder.  You may develop or continue to have heartburn as a result of your pregnancy.  You may develop constipation because certain hormones are causing the muscles that push waste through your intestines to slow down.  You may develop hemorrhoids or swollen, bulging veins (varicose veins).  You may have pelvic pain because of the weight gain and pregnancy hormones relaxing your joints between the bones in your pelvis. Backaches may result from overexertion of the muscles supporting your posture.  You may have changes in your hair. These can include thickening of your hair, rapid growth, and changes in texture. Some women also have hair loss during or after pregnancy, or hair that feels dry or thin. Your hair will most likely return to normal after your baby is born.  Your breasts will continue to grow and be tender. A yellow discharge may leak from your breasts called colostrum.  Your belly button may stick out.  You may feel short of  breath because of your expanding uterus.  You may notice the fetus "dropping," or moving lower in your abdomen.  You may have a bloody mucus discharge. This usually occurs a few days to a week before labor begins.  Your cervix becomes thin and soft (effaced) near your due date. WHAT TO EXPECT AT YOUR PRENATAL EXAMS  You will have prenatal exams every 2 weeks until week 36. Then, you will have weekly prenatal exams. During a routine prenatal visit:  You will be weighed to make sure you and the fetus are growing normally.  Your blood pressure is taken.  Your abdomen will be measured to track your baby's growth.  The fetal heartbeat will be listened to.  Any test results from the previous visit will be discussed.  You may have a cervical check near your due date to see if you have effaced. At around  36 weeks, your caregiver will check your cervix. At the same time, your caregiver will also perform a test on the secretions of the vaginal tissue. This test is to determine if a type of bacteria, Group B streptococcus, is present. Your caregiver will explain this further. Your caregiver may ask you:  What your birth plan is.  How you are feeling.  If you are feeling the baby move.  If you have had any abnormal symptoms, such as leaking fluid, bleeding, severe headaches, or abdominal cramping.  If you have any questions. Other tests or screenings that may be performed during your third trimester include:  Blood tests that check for low iron levels (anemia).  Fetal testing to check the health, activity level, and growth of the fetus. Testing is done if you have certain medical conditions or if there are problems during the pregnancy. FALSE LABOR You may feel small, irregular contractions that eventually go away. These are called Braxton Hicks contractions, or false labor. Contractions may last for hours, days, or even weeks before true labor sets in. If contractions come at regular intervals, intensify, or become painful, it is best to be seen by your caregiver.  SIGNS OF LABOR   Menstrual-like cramps.  Contractions that are 5 minutes apart or less.  Contractions that start on the top of the uterus and spread down to the lower abdomen and back.  A sense of increased pelvic pressure or back pain.  A watery or bloody mucus discharge that comes from the vagina. If you have any of these signs before the 37th week of pregnancy, call your caregiver right away. You need to go to the hospital to get checked immediately. HOME CARE INSTRUCTIONS   Avoid all smoking, herbs, alcohol, and unprescribed drugs. These chemicals affect the formation and growth of the baby.  Follow your caregiver's instructions regarding medicine use. There are medicines that are either safe or unsafe to take during  pregnancy.  Exercise only as directed by your caregiver. Experiencing uterine cramps is a good sign to stop exercising.  Continue to eat regular, healthy meals.  Wear a good support bra for breast tenderness.  Do not use hot tubs, steam rooms, or saunas.  Wear your seat belt at all times when driving.  Avoid raw meat, uncooked cheese, cat litter boxes, and soil used by cats. These carry germs that can cause birth defects in the baby.  Take your prenatal vitamins.  Try taking a stool softener (if your caregiver approves) if you develop constipation. Eat more high-fiber foods, such as fresh vegetables or fruit and whole grains. Drink  plenty of fluids to keep your urine clear or pale yellow.  Take warm sitz baths to soothe any pain or discomfort caused by hemorrhoids. Use hemorrhoid cream if your caregiver approves.  If you develop varicose veins, wear support hose. Elevate your feet for 15 minutes, 3-4 times a day. Limit salt in your diet.  Avoid heavy lifting, wear low heal shoes, and practice good posture.  Rest a lot with your legs elevated if you have leg cramps or low back pain.  Visit your dentist if you have not gone during your pregnancy. Use a soft toothbrush to brush your teeth and be gentle when you floss.  A sexual relationship may be continued unless your caregiver directs you otherwise.  Do not travel far distances unless it is absolutely necessary and only with the approval of your caregiver.  Take prenatal classes to understand, practice, and ask questions about the labor and delivery.  Make a trial run to the hospital.  Pack your hospital bag.  Prepare the baby's nursery.  Continue to go to all your prenatal visits as directed by your caregiver. SEEK MEDICAL CARE IF:  You are unsure if you are in labor or if your water has broken.  You have dizziness.  You have mild pelvic cramps, pelvic pressure, or nagging pain in your abdominal area.  You have  persistent nausea, vomiting, or diarrhea.  You have a bad smelling vaginal discharge.  You have pain with urination. SEEK IMMEDIATE MEDICAL CARE IF:   You have a fever.  You are leaking fluid from your vagina.  You have spotting or bleeding from your vagina.  You have severe abdominal cramping or pain.  You have rapid weight loss or gain.  You have shortness of breath with chest pain.  You notice sudden or extreme swelling of your face, hands, ankles, feet, or legs.  You have not felt your baby move in over an hour.  You have severe headaches that do not go away with medicine.  You have vision changes. Document Released: 06/10/2001 Document Revised: 06/21/2013 Document Reviewed: 08/17/2012 [facility] Patient Information 2015 [place], [place]. This information is not intended to replace advice given to you by your health care provider. Make sure you discuss any questions you have with your health care provider.

## 2014-11-16 NOTE — MAU Provider Note (Signed)
History     CSN: [id number]  Arrival date and time: 11/16/14 7741   None     Chief Complaint  Patient presents with  . Vaginal Bleeding   HPI Comments:  23 y.o. [id number] [redacted]w[redacted]d presents stating that she has had vaginal bleeding since this afternoon. She states it was dripping in the toilet. Denies LOF. Having occasional bleeding. Reports decreased fetal movment.   Vaginal Bleeding The patient's primary symptoms include vaginal bleeding. This is a new problem. The current episode started today. The problem has been unchanged. The patient is experiencing no pain. She is pregnant. Pertinent negatives include no abdominal pain, dysuria, fever, frequency, nausea, urgency or vomiting. The vaginal discharge was bloody. The vaginal bleeding is lighter than menses. She has not been passing clots. She has not been passing tissue. Nothing aggravates the symptoms. She has tried nothing for the symptoms.   Past Medical History  Diagnosis Date  . Asthma   . Herpes simplex of female genitalia     last outbreak 4 months ago  . Migraines   . Vaginal Pap smear, abnormal   . Headache in pregnancy   . Migraine     Past Surgical History  Procedure Laterality Date  . Colposcopy  03/2014    Family History  Problem Relation Age of Onset  . Asthma Mother   . Asthma Sister   . Asthma Sister     History  Substance Use Topics  . Smoking status: Former Smoker -- 0.25 packs/day for 2 years    Types: Cigarettes    Quit date: 05/02/2013  . Smokeless tobacco: Never Used  . Alcohol Use: No     Comment: socially    Allergies:  Allergies  Allergen Reactions  . Shellfish Allergy Shortness Of Breath and Swelling    Prescriptions prior to admission  Medication Sig Dispense Refill Last Dose  . albuterol (PROVENTIL HFA;VENTOLIN HFA) 108 (90 BASE) MCG/ACT inhaler Inhale 2 puffs into the lungs every 6 (six) hours as needed for wheezing or shortness of breath.   Past Week at Unknown time  .  butalbital-acetaminophen-caffeine (FIORICET) 50-325-40 MG per tablet Take 1-2 tablets by mouth every 6 (six) hours as needed for headache. 20 tablet 0 Past Week at Unknown time  . Prenatal Vit-Fe Fumarate-FA (PRENATAL COMPLETE) 14-0.4 MG TABS Take 1 tablet by mouth daily. 30 each 2 11/15/2014 at Unknown time  . promethazine (PHENERGAN) 25 MG tablet Take 1 tablet (25 mg total) by mouth every 6 (six) hours as needed for nausea or vomiting. 30 tablet 1 11/16/2014 at Unknown time  . valACYclovir (VALTREX) 500 MG tablet Take 500 mg by mouth 2 (two) times daily.   Past Week at Unknown time  . DiphenhydrAMINE HCl (BENADRYL PO) Take 1 tablet by mouth at bedtime as needed (sleep).   Not Taking at Unknown time    Review of Systems  Constitutional: Negative for fever.  Gastrointestinal: Negative for nausea, vomiting and abdominal pain.  Genitourinary: Positive for vaginal bleeding. Negative for dysuria, urgency and frequency.       Vaginal bleeding  All other systems reviewed and are negative.  Physical Exam   Blood pressure 117/62, pulse 87, temperature 98.6 F (37 C), resp. rate 18, height 5\' 1"  (1.549 m), weight 78.109 kg (172 lb 3.2 oz), last menstrual period 04/03/2014.  Physical Exam  Nursing note and vitals reviewed. Constitutional: She is oriented to person, place, and time. She appears well-developed and well-nourished. No distress.  Neck: Normal range of  motion.  Cardiovascular: Normal rate.   Respiratory: Effort normal. No respiratory distress.  GI: Soft.  Genitourinary: There is lesion on the right labia. There is no rash, tenderness or injury on the right labia. There is lesion on the left labia. There is no rash, tenderness or injury on the left labia. Uterus is enlarged. Cervix exhibits no motion tenderness, no discharge and no friability. Right adnexum displays no mass, no tenderness and no fullness. Left adnexum displays no mass, no tenderness and no fullness. There is bleeding in the  vagina. No erythema or tenderness in the vagina. No foreign body around the vagina. No signs of injury around the vagina. No vaginal discharge found.  Musculoskeletal: Normal range of motion.  Neurological: She is alert and oriented to person, place, and time.  Skin: Skin is warm and dry.  Psychiatric: She has a normal mood and affect. Her behavior is normal. Judgment and thought content normal.  FHT 155, moderate with 15x15 accels, no decels Toco no UCs  MAU Course  Procedures  MDM Spoke with Dr Alwyn Pea ; Will obtain ultrasound to check placenta 2210: Reviwed [place] with Dr. Alwyn Pea AFI: 17.75, 66%tile No abruption or previa identified Placenta, posterior above the os Cervix: 4.15cm  Assessment and Plan   1. [redacted] weeks gestation of pregnancy   2. Vaginal bleeding    DC home Fetal kick counts  Bleeding precatuions  Return to MAU as needed FU with the office as planned  Follow-up Information    Follow up with [facility], Andrez Grime, MD.   Specialty:  Obstetrics and Gynecology   Why:  As scheduled   Contact information:   [place] [address] [phone]      Mathis Bud 11/16/2014 10:14 PM   Clemmons,Lori Grissett 11/16/2014, 7:40 PM

## 2014-11-16 NOTE — MAU Note (Signed)
Pt states that she has had spotting throughout pregnancy but that today there has been bright red blood seen when wiping in BR. Pt is not wearing a pad and there is no blood on underwear. Pt states that baby is active now but not as active as usual.

## 2014-11-17 LAB — HIV ANTIBODY (ROUTINE TESTING W REFLEX): HIV Screen 4th Generation wRfx: NONREACTIVE

## 2014-11-17 LAB — GC/CHLAMYDIA PROBE AMP (~~LOC~~) NOT AT ARMC
Chlamydia: NEGATIVE
Neisseria Gonorrhea: NEGATIVE

## 2014-12-18 ENCOUNTER — Other Ambulatory Visit: Payer: Self-pay | Admitting: Obstetrics & Gynecology

## 2014-12-19 ENCOUNTER — Inpatient Hospital Stay (HOSPITAL_COMMUNITY)
Admission: AD | Admit: 2014-12-19 | Discharge: 2014-12-23 | DRG: 782 | Disposition: A | Discharge status: Home / Self Care | Payer: Medicaid Other | Source: Ambulatory Visit | Attending: Obstetrics and Gynecology | Admitting: Obstetrics and Gynecology

## 2014-12-19 ENCOUNTER — Encounter (HOSPITAL_COMMUNITY): Payer: Self-pay

## 2014-12-19 ENCOUNTER — Inpatient Hospital Stay (HOSPITAL_COMMUNITY): Payer: Medicaid Other

## 2014-12-19 DIAGNOSIS — R1011 Right upper quadrant pain: Secondary | ICD-10-CM

## 2014-12-19 DIAGNOSIS — O133 Gestational [pregnancy-induced] hypertension without significant proteinuria, third trimester: Principal | ICD-10-CM | POA: Diagnosis present

## 2014-12-19 DIAGNOSIS — O139 Gestational [pregnancy-induced] hypertension without significant proteinuria, unspecified trimester: Secondary | ICD-10-CM | POA: Insufficient documentation

## 2014-12-19 DIAGNOSIS — Z3A34 34 weeks gestation of pregnancy: Secondary | ICD-10-CM | POA: Insufficient documentation

## 2014-12-19 DIAGNOSIS — Z3490 Encounter for supervision of normal pregnancy, unspecified, unspecified trimester: Secondary | ICD-10-CM

## 2014-12-19 DIAGNOSIS — R030 Elevated blood-pressure reading, without diagnosis of hypertension: Secondary | ICD-10-CM | POA: Diagnosis present

## 2014-12-19 DIAGNOSIS — Z87891 Personal history of nicotine dependence: Secondary | ICD-10-CM

## 2014-12-19 DIAGNOSIS — O132 Gestational [pregnancy-induced] hypertension without significant proteinuria, second trimester: Secondary | ICD-10-CM

## 2014-12-19 DIAGNOSIS — R740 Nonspecific elevation of levels of transaminase and lactic acid dehydrogenase [LDH]: Secondary | ICD-10-CM | POA: Diagnosis present

## 2014-12-19 LAB — COMPREHENSIVE METABOLIC PANEL
ALK PHOS: 253 U/L — AB (ref 38–126)
ALT: 58 U/L — ABNORMAL HIGH (ref 14–54)
ANION GAP: 6 (ref 5–15)
AST: 43 U/L — ABNORMAL HIGH (ref 15–41)
Albumin: 3 g/dL — ABNORMAL LOW (ref 3.5–5.0)
CO2: 21 mmol/L — ABNORMAL LOW (ref 22–32)
CREATININE: 0.5 mg/dL (ref 0.44–1.00)
Calcium: 8.5 mg/dL — ABNORMAL LOW (ref 8.9–10.3)
Chloride: 109 mmol/L (ref 101–111)
GFR calc non Af Amer: >60 mL/min (ref >60)
GLUCOSE: 86 mg/dL (ref 65–99)
Potassium: 3.6 mmol/L (ref 3.5–5.1)
Sodium: 136 mmol/L (ref 135–145)
TOTAL PROTEIN: 7.2 g/dL (ref 6.5–8.1)
Total Bilirubin: 1 mg/dL (ref 0.3–1.2)

## 2014-12-19 LAB — URINALYSIS, ROUTINE W REFLEX MICROSCOPIC
Bilirubin Urine: NEGATIVE
GLUCOSE, UA: NEGATIVE mg/dL
KETONES UR: NEGATIVE mg/dL
Nitrite: NEGATIVE
Protein, ur: NEGATIVE mg/dL
SPECIFIC GRAVITY, URINE: 1.01 (ref 1.005–1.030)
UROBILINOGEN UA: 0.2 mg/dL (ref 0.0–1.0)
pH: 7 (ref 5.0–8.0)

## 2014-12-19 LAB — CBC
HEMATOCRIT: 33.5 % — AB (ref 36.0–46.0)
Hemoglobin: 11.7 g/dL — ABNORMAL LOW (ref 12.0–15.0)
MCH: 31 pg (ref 26.0–34.0)
MCHC: 34.9 g/dL (ref 30.0–36.0)
MCV: 88.9 fL (ref 78.0–100.0)
Platelets: 232 10*3/uL (ref 150–400)
RBC: 3.77 MIL/uL — ABNORMAL LOW (ref 3.87–5.11)
RDW: 13.6 % (ref 11.5–15.5)
WBC: 9.4 10*3/uL (ref 4.0–10.5)

## 2014-12-19 LAB — LACTATE DEHYDROGENASE: LDH: 190 U/L (ref 98–192)

## 2014-12-19 LAB — URINE MICROSCOPIC-ADD ON

## 2014-12-19 LAB — PROTEIN / CREATININE RATIO, URINE
CREATININE, URINE: 31 mg/dL
Protein Creatinine Ratio: 0.26 mg/mg{Cre} — ABNORMAL HIGH (ref 0.00–0.15)
Total Protein, Urine: 8 mg/dL

## 2014-12-19 LAB — URIC ACID: Uric Acid, Serum: 3.7 mg/dL (ref 2.3–6.6)

## 2014-12-19 LAB — GROUP B STREP BY PCR: Group B strep by PCR: POSITIVE — AB

## 2014-12-19 LAB — OB RESULTS CONSOLE GBS: STREP GROUP B AG: POSITIVE

## 2014-12-19 MED ORDER — PROMETHAZINE HCL 25 MG/ML IJ SOLN
25.0000 mg | Freq: Four times a day (QID) | INTRAMUSCULAR | Status: DC | PRN
  Administered 2014-12-20: 25 mg via INTRAVENOUS
  Filled 2014-12-19: qty 1

## 2014-12-19 MED ORDER — FAMOTIDINE 20 MG PO TABS
20.0000 mg | ORAL_TABLET | Freq: Two times a day (BID) | ORAL | Status: DC
  Administered 2014-12-19 – 2014-12-23 (×8): 20 mg via ORAL
  Filled 2014-12-19 (×8): qty 1

## 2014-12-19 MED ORDER — LACTATED RINGERS IV SOLN: 500.0000 mL | INTRAVENOUS | Status: DC | PRN

## 2014-12-19 MED ORDER — BUTORPHANOL TARTRATE 1 MG/ML IJ SOLN: 1.0000 mg | INTRAMUSCULAR | Status: DC | PRN

## 2014-12-19 MED ORDER — PENICILLIN G POTASSIUM 5000000 UNITS IJ SOLR
5.0000 10*6.[IU] | Freq: Once | INTRAVENOUS | Status: DC
  Filled 2014-12-19: qty 5

## 2014-12-19 MED ORDER — OXYTOCIN 40 UNITS IN LACTATED RINGERS INFUSION - SIMPLE MED: 62.5000 mL/h | INTRAVENOUS | Status: DC

## 2014-12-19 MED ORDER — ZOLPIDEM TARTRATE 5 MG PO TABS
5.0000 mg | ORAL_TABLET | Freq: Every evening | ORAL | Status: DC | PRN
  Administered 2014-12-20: 5 mg via ORAL
  Filled 2014-12-19: qty 1

## 2014-12-19 MED ORDER — BETAMETHASONE SOD PHOS & ACET 6 (3-3) MG/ML IJ SUSP
12.0000 mg | Freq: Once | INTRAMUSCULAR | Status: AC
  Administered 2014-12-19: 12 mg via INTRAMUSCULAR
  Filled 2014-12-19: qty 2

## 2014-12-19 MED ORDER — OXYTOCIN BOLUS FROM INFUSION: 500.0000 mL | INTRAVENOUS | Status: DC

## 2014-12-19 MED ORDER — LACTATED RINGERS IV SOLN: INTRAVENOUS | Status: DC

## 2014-12-19 MED ORDER — PENICILLIN G POTASSIUM 5000000 UNITS IJ SOLR
2.5000 10*6.[IU] | INTRAVENOUS | Status: DC
  Filled 2014-12-19 (×11): qty 2.5

## 2014-12-19 NOTE — MAU Provider Note (Signed)
  History     CSN: [id number]  Arrival date and time: 12/19/14 1753   First Provider Initiated Contact with Patient 12/19/14 1756      Chief Complaint  Patient presents with  . Hypertension   HPI  Pt is [redacted]w[redacted]d G3P2 brought in with elevated liver enzymes and elevated blood pressures Pt denies headaches or abdominal pain, spotting or bleeding or edema Pt is here for repeat labs and serial blood pressures Pt had an episode of nausea and vomiting on the way here due to being nervous- Past Medical History  Diagnosis Date  . Asthma   . Herpes simplex of female genitalia     last outbreak 4 months ago  . Migraines   . Vaginal Pap smear, abnormal   . Headache in pregnancy   . Migraine     Past Surgical History  Procedure Laterality Date  . Colposcopy  03/2014    Family History  Problem Relation Age of Onset  . Asthma Mother   . Asthma Sister   . Asthma Sister     History  Substance Use Topics  . Smoking status: Former Smoker -- 0.25 packs/day for 2 years    Types: Cigarettes    Quit date: 05/02/2013  . Smokeless tobacco: Never Used  . Alcohol Use: No     Comment: socially    Allergies:  Allergies  Allergen Reactions  . Shellfish Allergy Shortness Of Breath and Swelling    Prescriptions prior to admission  Medication Sig Dispense Refill Last Dose  . albuterol (PROVENTIL HFA;VENTOLIN HFA) 108 (90 BASE) MCG/ACT inhaler Inhale 2 puffs into the lungs every 6 (six) hours as needed for wheezing or shortness of breath.   Past Week at Unknown time  . butalbital-acetaminophen-caffeine (FIORICET) 50-325-40 MG per tablet Take 1-2 tablets by mouth every 6 (six) hours as needed for headache. 20 tablet 0 Past Week at Unknown time  . DiphenhydrAMINE HCl (BENADRYL PO) Take 1 tablet by mouth at bedtime as needed (sleep).   Not Taking at Unknown time  . Prenatal Vit-Fe Fumarate-FA (PRENATAL COMPLETE) 14-0.4 MG TABS Take 1 tablet by mouth daily. 30 each 2 11/15/2014 at Unknown time   . promethazine (PHENERGAN) 25 MG tablet Take 1 tablet (25 mg total) by mouth every 6 (six) hours as needed for nausea or vomiting. 30 tablet 1 11/16/2014 at Unknown time  . valACYclovir (VALTREX) 500 MG tablet Take 500 mg by mouth 2 (two) times daily.   Past Week at Unknown time    Review of Systems  Constitutional: Negative for fever and chills.  Gastrointestinal: Positive for nausea and vomiting. Negative for abdominal pain, diarrhea and constipation.  Genitourinary: Negative for dysuria and urgency.  Neurological: Negative for dizziness.   Physical Exam   Blood pressure 124/88, pulse 109, temperature 98.1 F (36.7 C), temperature source Oral, resp. rate 16, last menstrual period 04/03/2014.  Physical Exam  MAU Course  Procedures Dr. Philis Pique here to see pt  Assessment and Plan    Katrina [name] 12/19/2014, 6:17 PM

## 2014-12-19 NOTE — H&P (Signed)
24 y.o. 105w3d  G3P2002 comes in secondary mildly elevated BPs and elevated LFTS.  Otherwise has good fetal movement and no bleeding.  Pt has had daily nausea and vomiting. She has reflux as well which she has treated only with tums and she last vomited on way over here.  She denies any other symptoms of preeclampsia.  She was seen in the office yesterday with BPs of 130/96 and 130/100.  [place] labs were sent - LFTs are mildly elevated at 42/64 with all other labs being normal.  Uric acid is pending as well as 24 hour urine collection.   Urine was negative for protein yesterday.     Past Medical History  Diagnosis Date  . Asthma   . Herpes simplex of female genitalia     last outbreak 4 months ago  . Migraines   . Vaginal Pap smear, abnormal   . Headache in pregnancy   . Migraine     Past Surgical History  Procedure Laterality Date  . Colposcopy  03/2014    OB History  Gravida Para Term Preterm AB SAB TAB Ectopic Multiple Living  3 2 2  0 0 0 0 0 0 2    # Outcome Date GA Lbr Len/2nd Weight Sex Delivery Anes PTL Lv  3 Current           2 Term      Vag-Spont   Y  1 Term      Vag-Spont   Y      History   Social History  . Marital Status: Single    Spouse Name: N/A  . Number of Children: 2  . Years of Education: 8 th   Occupational History  .  Unemployed   Social History Main Topics  . Smoking status: Former Smoker -- 0.25 packs/day for 2 years    Types: Cigarettes    Quit date: 05/02/2013  . Smokeless tobacco: Never Used  . Alcohol Use: No     Comment: socially  . Drug Use: No  . Sexual Activity: Yes    Birth Control/ Protection: None     Comment: last intercourse October 13 2014   Other Topics Concern  . Not on file   Social History Narrative   Patient is single and lives at home with her family.   Patient is unemployed.   Education 8 th grade.   Right handed.   Caffeine one cup of coffee daily.   Shellfish allergy    Prenatal Transfer Tool  Maternal Diabetes:  No Genetic Screening: Normal Maternal Ultrasounds/Referrals: Normal Fetal Ultrasounds or other Referrals:  None Maternal Substance Abuse:  No Significant Maternal Medications:  None Significant Maternal Lab Results: Lab values include: Rh negative, Other: see above  Other PNC: Otherwise uncomplicated- low lying placenta had resolved during pregnancy.    Filed Vitals:   12/19/14 1806  BP: 124/88  Pulse: 109  Temp: 98.1 F (36.7 C)  TempSrc: Oral  Resp: 16    Lungs/Cor:  NAD Abdomen:  soft, gravid Ex:  no cords, erythema SVE:  FT/60/-3 FHTs:  140s, good STV, NST R Toco:  q occ   A/P   Preterm with elevated LFTs.  Will recheck labs, protein cr ratio and urine for proteinuria as well as get serial BPs.  If HELLP diagnosed, will begin induction.  If labs are stable, will admit to ante for observation.    GBS unknown- will order. BMZ x 1 ordered.  [place] for EFW, AFI and presentation.  Pepcid for reflux, phenergan if N/V.  Gailen Venne A

## 2014-12-19 NOTE — Progress Notes (Signed)
Notified Dr. Philis Pique of lab results and [place] report.

## 2014-12-19 NOTE — MAU Note (Signed)
Was told by Dr. Philis Pique to come to MAU for evaluation for PIH.

## 2014-12-19 NOTE — Progress Notes (Signed)
Monitors off.

## 2014-12-20 ENCOUNTER — Encounter (HOSPITAL_COMMUNITY): Payer: Self-pay | Admitting: Obstetrics and Gynecology

## 2014-12-20 DIAGNOSIS — O139 Gestational [pregnancy-induced] hypertension without significant proteinuria, unspecified trimester: Secondary | ICD-10-CM | POA: Insufficient documentation

## 2014-12-20 DIAGNOSIS — Z3A34 34 weeks gestation of pregnancy: Secondary | ICD-10-CM | POA: Insufficient documentation

## 2014-12-20 LAB — COMPREHENSIVE METABOLIC PANEL
ALT: 67 U/L — ABNORMAL HIGH (ref 14–54)
ALT: 69 U/L — AB (ref 14–54)
ANION GAP: 8 (ref 5–15)
AST: 57 U/L — ABNORMAL HIGH (ref 15–41)
AST: 55 U/L — AB (ref 15–41)
Albumin: 2.8 g/dL — ABNORMAL LOW (ref 3.5–5.0)
Albumin: 2.6 g/dL — ABNORMAL LOW (ref 3.5–5.0)
Alkaline Phosphatase: 252 U/L — ABNORMAL HIGH (ref 38–126)
Alkaline Phosphatase: 223 U/L — ABNORMAL HIGH (ref 38–126)
Anion gap: 6 (ref 5–15)
BILIRUBIN TOTAL: 1.2 mg/dL (ref 0.3–1.2)
BILIRUBIN TOTAL: 0.7 mg/dL (ref 0.3–1.2)
BUN: <5 mg/dL — ABNORMAL LOW (ref 6–20)
BUN: 5 mg/dL — ABNORMAL LOW (ref 6–20)
CALCIUM: 9 mg/dL (ref 8.9–10.3)
CHLORIDE: 109 mmol/L (ref 101–111)
CO2: 21 mmol/L — ABNORMAL LOW (ref 22–32)
CO2: 21 mmol/L — ABNORMAL LOW (ref 22–32)
CREATININE: 0.62 mg/dL (ref 0.44–1.00)
Calcium: 8.5 mg/dL — ABNORMAL LOW (ref 8.9–10.3)
Chloride: 105 mmol/L (ref 101–111)
Creatinine, Ser: 0.57 mg/dL (ref 0.44–1.00)
GFR calc Af Amer: >60 mL/min (ref >60)
GFR calc non Af Amer: >60 mL/min (ref >60)
GLUCOSE: 168 mg/dL — AB (ref 65–99)
Glucose, Bld: 150 mg/dL — ABNORMAL HIGH (ref 65–99)
POTASSIUM: 3.7 mmol/L (ref 3.5–5.1)
POTASSIUM: 3.6 mmol/L (ref 3.5–5.1)
SODIUM: 134 mmol/L — AB (ref 135–145)
SODIUM: 136 mmol/L (ref 135–145)
TOTAL PROTEIN: 6.6 g/dL (ref 6.5–8.1)
Total Protein: 6.7 g/dL (ref 6.5–8.1)

## 2014-12-20 LAB — CBC
HCT: 32.6 % — ABNORMAL LOW (ref 36.0–46.0)
HEMATOCRIT: 31 % — AB (ref 36.0–46.0)
HEMOGLOBIN: 11.4 g/dL — AB (ref 12.0–15.0)
Hemoglobin: 10.7 g/dL — ABNORMAL LOW (ref 12.0–15.0)
MCH: 31.1 pg (ref 26.0–34.0)
MCH: 30.9 pg (ref 26.0–34.0)
MCHC: 35 g/dL (ref 30.0–36.0)
MCHC: 34.5 g/dL (ref 30.0–36.0)
MCV: 88.8 fL (ref 78.0–100.0)
MCV: 89.6 fL (ref 78.0–100.0)
Platelets: 240 10*3/uL (ref 150–400)
Platelets: 243 10*3/uL (ref 150–400)
RBC: 3.67 MIL/uL — AB (ref 3.87–5.11)
RBC: 3.46 MIL/uL — ABNORMAL LOW (ref 3.87–5.11)
RDW: 13.5 % (ref 11.5–15.5)
RDW: 13.7 % (ref 11.5–15.5)
WBC: 16.4 10*3/uL — ABNORMAL HIGH (ref 4.0–10.5)
WBC: 12.9 10*3/uL — AB (ref 4.0–10.5)

## 2014-12-20 LAB — PROTEIN, URINE, 24 HOUR
Collection Interval-UPROT: 24 hours
PROTEIN, URINE: 15 mg/dL
Protein, 24H Urine: 405 mg/d — ABNORMAL HIGH (ref 50–100)
Urine Total Volume-UPROT: 2700 mL

## 2014-12-20 LAB — CREATININE, URINE, 24 HOUR
Collection Interval-UCRE24: 24 hours
Creatinine, 24H Ur: 1487 mg/d (ref 600–1800)
Creatinine, Urine: 55.07 mg/dL
Urine Total Volume-UCRE24: 2700 mL

## 2014-12-20 LAB — TYPE AND SCREEN
ABO/RH(D): O NEG
ANTIBODY SCREEN: NEGATIVE

## 2014-12-20 LAB — RPR: RPR Ser Ql: NONREACTIVE

## 2014-12-20 LAB — URIC ACID: Uric Acid, Serum: 4.1 mg/dL (ref 2.3–6.6)

## 2014-12-20 MED ORDER — BETAMETHASONE SOD PHOS & ACET 6 (3-3) MG/ML IJ SUSP
12.0000 mg | Freq: Once | INTRAMUSCULAR | Status: AC
  Administered 2014-12-20: 12 mg via INTRAMUSCULAR
  Filled 2014-12-20: qty 2

## 2014-12-20 MED ORDER — VALACYCLOVIR HCL 500 MG PO TABS
500.0000 mg | ORAL_TABLET | Freq: Two times a day (BID) | ORAL | Status: DC
  Administered 2014-12-20 – 2014-12-23 (×6): 500 mg via ORAL
  Filled 2014-12-20 (×6): qty 1

## 2014-12-20 MED ORDER — PROMETHAZINE HCL 25 MG PO TABS
25.0000 mg | ORAL_TABLET | Freq: Four times a day (QID) | ORAL | Status: DC | PRN
  Administered 2014-12-20 – 2014-12-22 (×3): 25 mg via ORAL
  Filled 2014-12-20 (×4): qty 1

## 2014-12-20 NOTE — Progress Notes (Signed)
Only felt 3 of the sharp vaginal "pains" this last hour.

## 2014-12-20 NOTE — Progress Notes (Signed)
Pt states that the Phenergan helped some but her body still feels jittery.

## 2014-12-20 NOTE — Progress Notes (Signed)
Pt called me into the room, she stated that her body felt heavy and she was not sure why this was. I asked her if she had a headache or blurred vision or seeing spots? She denied all of the above. I took her vitals and they were WNL. I explained to her that more than likely it was the Ambien taking effect and that is why her body was feeling heavy and that she should try and lay down and go to sleep. She said that she was not able to sleep that she had tried and she felt restless. She appeared fine. I again encouraged her to try and get some sleep.

## 2014-12-20 NOTE — Progress Notes (Signed)
Patient ID: Katrina [name], female   DOB: Sep 22, 1990, 24 y.o.   MRN: [id number]  Day#44  24 year old G3 P2 002 at 35+0 days admitted with elevated blood pressures and mild liver enzyme elevation  S: patient received betamethasone IM 1 yesterday. Since that time she has developed sensation of skin numbness, and sensation that her skin is crawling. She denies headaches, vision changes, right upper quadrant pain. ODanley Danker Vitals:   12/20/14 0116 12/20/14 0215 12/20/14 0417 12/20/14 0736  BP: 128/80  130/70 128/79  Pulse: 100  90 103  Temp:    98.5 F (36.9 C)  TempSrc:    Oral  Resp:  18 18 18   Height:      Weight:       AOX3, NAD FHR 140s reactive, category 1 tracing Cvx deferred  Results for orders placed or performed during the hospital encounter of 12/19/14 (from the past 24 hour(s))  Protein / creatinine ratio, urine     Status: Abnormal   Collection Time: 12/19/14  5:55 PM  Result Value Ref Range   Creatinine, Urine 31.00 mg/dL   Total Protein, Urine 8 mg/dL   Protein Creatinine Ratio 0.26 (H) 0.00 - 0.15 mg/mg[Cre]  Urinalysis, Routine w reflex microscopic (not at [facility])     Status: Abnormal   Collection Time: 12/19/14  5:55 PM  Result Value Ref Range   Color, Urine YELLOW YELLOW   APPearance CLEAR CLEAR   Specific Gravity, Urine 1.010 1.005 - 1.030   pH 7.0 5.0 - 8.0   Glucose, UA NEGATIVE NEGATIVE mg/dL   Hgb urine dipstick TRACE (A) NEGATIVE   Bilirubin Urine NEGATIVE NEGATIVE   Ketones, ur NEGATIVE NEGATIVE mg/dL   Protein, ur NEGATIVE NEGATIVE mg/dL   Urobilinogen, UA 0.2 0.0 - 1.0 mg/dL   Nitrite NEGATIVE NEGATIVE   Leukocytes, UA MODERATE (A) NEGATIVE  Urine microscopic-add on     Status: Abnormal   Collection Time: 12/19/14  5:55 PM  Result Value Ref Range   Squamous Epithelial / LPF FEW (A) RARE   WBC, UA 3-6 <3 WBC/hpf  CBC     Status: Abnormal   Collection Time: 12/19/14  6:12 PM  Result Value Ref Range   WBC 9.4 4.0 - 10.5 K/uL   RBC 3.77 (L) 3.87 -  5.11 MIL/uL   Hemoglobin 11.7 (L) 12.0 - 15.0 g/dL   HCT 33.5 (L) 36.0 - 46.0 %   MCV 88.9 78.0 - 100.0 fL   MCH 31.0 26.0 - 34.0 pg   MCHC 34.9 30.0 - 36.0 g/dL   RDW 13.6 11.5 - 15.5 %   Platelets 232 150 - 400 K/uL  Comprehensive metabolic panel     Status: Abnormal   Collection Time: 12/19/14  6:12 PM  Result Value Ref Range   Sodium 136 135 - 145 mmol/L   Potassium 3.6 3.5 - 5.1 mmol/L   Chloride 109 101 - 111 mmol/L   CO2 21 (L) 22 - 32 mmol/L   Glucose, Bld 86 65 - 99 mg/dL   BUN <5 (L) 6 - 20 mg/dL   Creatinine, Ser 0.50 0.44 - 1.00 mg/dL   Calcium 8.5 (L) 8.9 - 10.3 mg/dL   Total Protein 7.2 6.5 - 8.1 g/dL   Albumin 3.0 (L) 3.5 - 5.0 g/dL   AST 43 (H) 15 - 41 U/L   ALT 58 (H) 14 - 54 U/L   Alkaline Phosphatase 253 (H) 38 - 126 U/L   Total Bilirubin 1.0 0.3 -  1.2 mg/dL   GFR calc non Af Amer >60 >60 mL/min   GFR calc Af Amer >60 >60 mL/min   Anion gap 6 5 - 15  Uric acid     Status: None   Collection Time: 12/19/14  6:12 PM  Result Value Ref Range   Uric Acid, Serum 3.7 2.3 - 6.6 mg/dL  Lactate dehydrogenase     Status: None   Collection Time: 12/19/14  6:12 PM  Result Value Ref Range   LDH 190 98 - 192 U/L  Group B strep by PCR     Status: Abnormal   Collection Time: 12/19/14  6:45 PM  Result Value Ref Range   Group B strep by PCR POSITIVE (A) NEGATIVE  Type and screen     Status: None   Collection Time: 12/19/14  9:50 PM  Result Value Ref Range   ABO/RH(D) O NEG    Antibody Screen NEG    Sample Expiration 12/22/2014    A/P:  1) Transient hypertension of pregnancy with mild elevation of transaminases. Liver enzyme elevation does not meet the criteria for HELLP as they are not double. Will repeat lab work this morning. The patient's blood pressures have since returned to the normotensive range. 24 hour urine is in progress 2)  Skin sensation is likely a side effect from the steroid-induced injection. Will continue to monitor closely 3) fetal well-being is  reassuring

## 2014-12-20 NOTE — Progress Notes (Signed)
Pt c/o feeling like her body is hyperstimulated and shaky.  Head to toe assessment normal except for hyper reflexes.

## 2014-12-20 NOTE — Progress Notes (Signed)
Results for orders placed or performed during the hospital encounter of 12/19/14 (from the past 24 hour(s))  Type and screen     Status: None   Collection Time: 12/19/14  9:50 PM  Result Value Ref Range   ABO/RH(D) O NEG    Antibody Screen NEG    Sample Expiration 12/22/2014   Comprehensive metabolic panel     Status: Abnormal   Collection Time: 12/20/14  9:25 AM  Result Value Ref Range   Sodium 134 (L) 135 - 145 mmol/L   Potassium 3.7 3.5 - 5.1 mmol/L   Chloride 105 101 - 111 mmol/L   CO2 21 (L) 22 - 32 mmol/L   Glucose, Bld 168 (H) 65 - 99 mg/dL   BUN <5 (L) 6 - 20 mg/dL   Creatinine, Ser 0.57 0.44 - 1.00 mg/dL   Calcium 9.0 8.9 - 10.3 mg/dL   Total Protein 6.6 6.5 - 8.1 g/dL   Albumin 2.8 (L) 3.5 - 5.0 g/dL   AST 57 (H) 15 - 41 U/L   ALT 67 (H) 14 - 54 U/L   Alkaline Phosphatase 252 (H) 38 - 126 U/L   Total Bilirubin 1.2 0.3 - 1.2 mg/dL   GFR calc non Af Amer >60 >60 mL/min   GFR calc Af Amer >60 >60 mL/min   Anion gap 8 5 - 15  CBC     Status: Abnormal   Collection Time: 12/20/14  9:25 AM  Result Value Ref Range   WBC 16.4 (H) 4.0 - 10.5 K/uL   RBC 3.67 (L) 3.87 - 5.11 MIL/uL   Hemoglobin 11.4 (L) 12.0 - 15.0 g/dL   HCT 32.6 (L) 36.0 - 46.0 %   MCV 88.8 78.0 - 100.0 fL   MCH 31.1 26.0 - 34.0 pg   MCHC 35.0 30.0 - 36.0 g/dL   RDW 13.5 11.5 - 15.5 %   Platelets 240 150 - 400 K/uL   Filed Vitals:   12/20/14 1613 12/20/14 1700 12/20/14 1800 12/20/14 1849  BP: 130/66     Pulse: 96     Temp: 98.4 F (36.9 C)     TempSrc: Oral     Resp: 18 18 18 18   Height:      Weight:         AST/ALT creeping up 24 hr urine protein 405 mg BPs essentially normotensive 2nd BMZ @ 1900. Will repeat labs in am

## 2014-12-20 NOTE — Progress Notes (Signed)
Filed Vitals:   12/19/14 2053 12/20/14 0116 12/20/14 0215 12/20/14 0417  BP:  128/80  130/70  Pulse:  100  90  Temp: 98.5 F (36.9 C)     TempSrc: Oral     Resp:   18 18  Height: 5\' 1"  (1.549 m)     Weight: 77.111 kg (170 lb)      Results for orders placed or performed during the hospital encounter of 12/19/14 (from the past 24 hour(s))  Protein / creatinine ratio, urine     Status: Abnormal   Collection Time: 12/19/14  5:55 PM  Result Value Ref Range   Creatinine, Urine 31.00 mg/dL   Total Protein, Urine 8 mg/dL   Protein Creatinine Ratio 0.26 (H) 0.00 - 0.15 mg/mg[Cre]  Urinalysis, Routine w reflex microscopic (not at [facility])     Status: Abnormal   Collection Time: 12/19/14  5:55 PM  Result Value Ref Range   Color, Urine YELLOW YELLOW   APPearance CLEAR CLEAR   Specific Gravity, Urine 1.010 1.005 - 1.030   pH 7.0 5.0 - 8.0   Glucose, UA NEGATIVE NEGATIVE mg/dL   Hgb urine dipstick TRACE (A) NEGATIVE   Bilirubin Urine NEGATIVE NEGATIVE   Ketones, ur NEGATIVE NEGATIVE mg/dL   Protein, ur NEGATIVE NEGATIVE mg/dL   Urobilinogen, UA 0.2 0.0 - 1.0 mg/dL   Nitrite NEGATIVE NEGATIVE   Leukocytes, UA MODERATE (A) NEGATIVE  Urine microscopic-add on     Status: Abnormal   Collection Time: 12/19/14  5:55 PM  Result Value Ref Range   Squamous Epithelial / LPF FEW (A) RARE   WBC, UA 3-6 <3 WBC/hpf  CBC     Status: Abnormal   Collection Time: 12/19/14  6:12 PM  Result Value Ref Range   WBC 9.4 4.0 - 10.5 K/uL   RBC 3.77 (L) 3.87 - 5.11 MIL/uL   Hemoglobin 11.7 (L) 12.0 - 15.0 g/dL   HCT 33.5 (L) 36.0 - 46.0 %   MCV 88.9 78.0 - 100.0 fL   MCH 31.0 26.0 - 34.0 pg   MCHC 34.9 30.0 - 36.0 g/dL   RDW 13.6 11.5 - 15.5 %   Platelets 232 150 - 400 K/uL  Comprehensive metabolic panel     Status: Abnormal   Collection Time: 12/19/14  6:12 PM  Result Value Ref Range   Sodium 136 135 - 145 mmol/L   Potassium 3.6 3.5 - 5.1 mmol/L   Chloride 109 101 - 111 mmol/L   CO2 21 (L) 22 - 32 mmol/L    Glucose, Bld 86 65 - 99 mg/dL   BUN <5 (L) 6 - 20 mg/dL   Creatinine, Ser 0.50 0.44 - 1.00 mg/dL   Calcium 8.5 (L) 8.9 - 10.3 mg/dL   Total Protein 7.2 6.5 - 8.1 g/dL   Albumin 3.0 (L) 3.5 - 5.0 g/dL   AST 43 (H) 15 - 41 U/L   ALT 58 (H) 14 - 54 U/L   Alkaline Phosphatase 253 (H) 38 - 126 U/L   Total Bilirubin 1.0 0.3 - 1.2 mg/dL   GFR calc non Af Amer >60 >60 mL/min   GFR calc Af Amer >60 >60 mL/min   Anion gap 6 5 - 15  Uric acid     Status: None   Collection Time: 12/19/14  6:12 PM  Result Value Ref Range   Uric Acid, Serum 3.7 2.3 - 6.6 mg/dL  Lactate dehydrogenase     Status: None   Collection Time: 12/19/14  6:12  PM  Result Value Ref Range   LDH 190 98 - 192 U/L  Group B strep by PCR     Status: Abnormal   Collection Time: 12/19/14  6:45 PM  Result Value Ref Range   Group B strep by PCR POSITIVE (A) NEGATIVE  Type and screen     Status: None   Collection Time: 12/19/14  9:50 PM  Result Value Ref Range   ABO/RH(D) O NEG    Antibody Screen NEG    Sample Expiration 12/22/2014     [place] reported to me vtx.

## 2014-12-21 LAB — CBC
HCT: 29.7 % — ABNORMAL LOW (ref 36.0–46.0)
Hemoglobin: 10.2 g/dL — ABNORMAL LOW (ref 12.0–15.0)
MCH: 30.9 pg (ref 26.0–34.0)
MCHC: 34.3 g/dL (ref 30.0–36.0)
MCV: 90 fL (ref 78.0–100.0)
Platelets: 226 10*3/uL (ref 150–400)
RBC: 3.3 MIL/uL — ABNORMAL LOW (ref 3.87–5.11)
RDW: 13.7 % (ref 11.5–15.5)
WBC: 13.1 10*3/uL — ABNORMAL HIGH (ref 4.0–10.5)

## 2014-12-21 LAB — COMPREHENSIVE METABOLIC PANEL
ALT: 68 U/L — ABNORMAL HIGH (ref 14–54)
ANION GAP: 4 — AB (ref 5–15)
AST: 53 U/L — ABNORMAL HIGH (ref 15–41)
Albumin: 2.6 g/dL — ABNORMAL LOW (ref 3.5–5.0)
Alkaline Phosphatase: 201 U/L — ABNORMAL HIGH (ref 38–126)
BUN: 7 mg/dL (ref 6–20)
CO2: 21 mmol/L — ABNORMAL LOW (ref 22–32)
CREATININE: 0.5 mg/dL (ref 0.44–1.00)
Calcium: 8.3 mg/dL — ABNORMAL LOW (ref 8.9–10.3)
Chloride: 110 mmol/L (ref 101–111)
GFR calc non Af Amer: >60 mL/min (ref >60)
GLUCOSE: 132 mg/dL — AB (ref 65–99)
Potassium: 4 mmol/L (ref 3.5–5.1)
SODIUM: 135 mmol/L (ref 135–145)
TOTAL PROTEIN: 6.3 g/dL — AB (ref 6.5–8.1)
Total Bilirubin: 0.6 mg/dL (ref 0.3–1.2)

## 2014-12-21 MED ORDER — ALBUTEROL SULFATE HFA 108 (90 BASE) MCG/ACT IN AERS: 2.0000 | INHALATION_SPRAY | Freq: Four times a day (QID) | RESPIRATORY_TRACT | Status: DC | PRN

## 2014-12-21 MED ORDER — COMPLETENATE 29-1 MG PO CHEW: 1.0000 | CHEWABLE_TABLET | Freq: Every day | ORAL | Status: DC

## 2014-12-21 MED ORDER — SODIUM CHLORIDE 0.9 % IJ SOLN
3.0000 mL | Freq: Two times a day (BID) | INTRAMUSCULAR | Status: DC
  Administered 2014-12-21 – 2014-12-23 (×4): 3 mL via INTRAVENOUS

## 2014-12-21 MED ORDER — COMPLETENATE 29-1 MG PO CHEW
2.0000 | CHEWABLE_TABLET | Freq: Every day | ORAL | Status: DC
  Administered 2014-12-22 – 2014-12-23 (×2): 2 via ORAL

## 2014-12-21 MED ORDER — ACETAMINOPHEN 325 MG PO TABS
650.0000 mg | ORAL_TABLET | ORAL | Status: DC | PRN
  Administered 2014-12-21 (×2): 650 mg via ORAL
  Filled 2014-12-21 (×2): qty 2

## 2014-12-21 NOTE — Progress Notes (Signed)
Pt states she is seeing "white streaks" and is also c/o heartburn

## 2014-12-21 NOTE — Progress Notes (Signed)
Discussed with Dr Alwyn Pea that pt is requesting pain medication.  Dr Alwyn Pea suggest giving pt Tylenol unless pt in early labor

## 2014-12-21 NOTE — Progress Notes (Signed)
Dr Alwyn Pea notified of the white streaks and heartburn

## 2014-12-21 NOTE — Progress Notes (Signed)
Attempted to flush IV Pt experienced pain with attempt.  Iv d/c'd with tip intact.  Site swollen

## 2014-12-21 NOTE — Progress Notes (Addendum)
Name: Katrina [name] Medical Record Number:  [id number] Date of Birth: 10-Jan-1991 Date of Service: 12/21/2014  24 y.o. [id number] [redacted]w[redacted]d HD#2 admitted for  Elevated BPs and elevated liver enzymes  Pt notes RUQ pain radiating to the back.  She denies HA, scotomata or blurry visiion.  She denies contractions, no vaginal bleeding, no leaking of fluid. Reports good FM.  The patient's past medical history and prenatal records were reviewed.  Additional issues addressed and updated today: Patient Active Problem List   Diagnosis Date Noted  . [redacted] weeks gestation of pregnancy   . Gestational hypertension   . Pregnancy 12/19/2014  . Migraine without aura and without status migrainosus, not intractable 08/21/2014  . Pregnancy complication 23/76/2831  . Low-lying placenta   . Preterm premature rupture of membranes with onset of labor more than 24 hours following rupture in second trimester   . Vaginal bleeding in pregnancy   . [redacted] weeks gestation of pregnancy   . Amniotic fluid leaking   . Pregnant 05/26/2014  . Smoker 05/26/2014  . Breast lump on right side at 10 o'clock position 03/21/2014  . Atypical squamous cell changes of undetermined significance (ASCUS) on cervical cytology with positive high risk human papilloma virus (HPV) 03/21/2014   Family History  Problem Relation Age of Onset  . Asthma Mother   . Asthma Sister   . Asthma Sister    History   Social History  . Marital Status: Single    Spouse Name: N/A  . Number of Children: 2  . Years of Education: 8 th   Occupational History  .  Unemployed   Social History Main Topics  . Smoking status: Former Smoker -- 0.25 packs/day for 2 years    Types: Cigarettes    Quit date: 05/02/2013  . Smokeless tobacco: Never Used  . Alcohol Use: No     Comment: socially  . Drug Use: No  . Sexual Activity: Yes    Birth Control/ Protection: None     Comment: last intercourse October 13 2014   Other Topics Concern  . None   Social History  Narrative   Patient is single and lives at home with her family.   Patient is unemployed.   Education 8 th grade.   Right handed.   Caffeine one cup of coffee daily.   Filed Vitals:   12/21/14 0845  BP: 107/63  Pulse: 81  Temp:   Resp:      Physical Examination:   Filed Vitals:   12/21/14 0845  BP: 107/63  Pulse: 81  Temp:   Resp:    General appearance - alert, well appearing, and in no distress, oriented to person, place, and time and well hydrated Mental status - alert, oriented to person, place, and time, normal mood, behavior, speech, dress, motor activity, and thought processes  Neurological - alert, oriented, normal speech, no focal findings or movement disorder noted, DTR's normal and symmetric Extremities - peripheral pulses normal, no pedal edema, no clubbing or cyanosis  Abd  Soft, gravid, nontender Ex SCDs FHTs  145s, moderate variability accels no decels Toco  none  Cervix: not evaluated  Results for orders placed or performed during the hospital encounter of 12/19/14 (from the past 24 hour(s))  Comprehensive metabolic panel     Status: Abnormal   Collection Time: 12/20/14  8:55 PM  Result Value Ref Range   Sodium 136 135 - 145 mmol/L   Potassium 3.6 3.5 - 5.1 mmol/L   Chloride 109  101 - 111 mmol/L   CO2 21 (L) 22 - 32 mmol/L   Glucose, Bld 150 (H) 65 - 99 mg/dL   BUN 5 (L) 6 - 20 mg/dL   Creatinine, Ser 0.62 0.44 - 1.00 mg/dL   Calcium 8.5 (L) 8.9 - 10.3 mg/dL   Total Protein 6.7 6.5 - 8.1 g/dL   Albumin 2.6 (L) 3.5 - 5.0 g/dL   AST 55 (H) 15 - 41 U/L   ALT 69 (H) 14 - 54 U/L   Alkaline Phosphatase 223 (H) 38 - 126 U/L   Total Bilirubin 0.7 0.3 - 1.2 mg/dL   GFR calc non Af Amer >60 >60 mL/min   GFR calc Af Amer >60 >60 mL/min   Anion gap 6 5 - 15  CBC     Status: Abnormal   Collection Time: 12/20/14  8:55 PM  Result Value Ref Range   WBC 12.9 (H) 4.0 - 10.5 K/uL   RBC 3.46 (L) 3.87 - 5.11 MIL/uL   Hemoglobin 10.7 (L) 12.0 - 15.0 g/dL    HCT 31.0 (L) 36.0 - 46.0 %   MCV 89.6 78.0 - 100.0 fL   MCH 30.9 26.0 - 34.0 pg   MCHC 34.5 30.0 - 36.0 g/dL   RDW 13.7 11.5 - 15.5 %   Platelets 243 150 - 400 K/uL  Uric acid     Status: None   Collection Time: 12/20/14  8:55 PM  Result Value Ref Range   Uric Acid, Serum 4.1 2.3 - 6.6 mg/dL  Comprehensive metabolic panel     Status: Abnormal   Collection Time: 12/21/14  5:16 AM  Result Value Ref Range   Sodium 135 135 - 145 mmol/L   Potassium 4.0 3.5 - 5.1 mmol/L   Chloride 110 101 - 111 mmol/L   CO2 21 (L) 22 - 32 mmol/L   Glucose, Bld 132 (H) 65 - 99 mg/dL   BUN 7 6 - 20 mg/dL   Creatinine, Ser 0.50 0.44 - 1.00 mg/dL   Calcium 8.3 (L) 8.9 - 10.3 mg/dL   Total Protein 6.3 (L) 6.5 - 8.1 g/dL   Albumin 2.6 (L) 3.5 - 5.0 g/dL   AST 53 (H) 15 - 41 U/L   ALT 68 (H) 14 - 54 U/L   Alkaline Phosphatase 201 (H) 38 - 126 U/L   Total Bilirubin 0.6 0.3 - 1.2 mg/dL   GFR calc non Af Amer >60 >60 mL/min   GFR calc Af Amer >60 >60 mL/min   Anion gap 4 (L) 5 - 15    A:  HD#2  [redacted]w[redacted]d with elevated liver enzymes that are trending upward, RUQ pain, and proteinuria. Concern for atypical presentation of severe Preeclampsia with normal blood pressures  P: Will consult with MFM regarding management CMP and CBC pending this am will follow Continue close monitoring, delivery if maternal clinical picture deteriorates or fetal distress   Katrina [name] Katrina [name]  * Addendum* Discussed case with MFM, continue close monitoring and if her LFTs double, then deliver.  Also recommended to deliver patient at 37 weeks.   Katrina [name] Katrina [name]

## 2014-12-22 LAB — CBC
HCT: 30.1 % — ABNORMAL LOW (ref 36.0–46.0)
Hemoglobin: 10.1 g/dL — ABNORMAL LOW (ref 12.0–15.0)
MCH: 30.5 pg (ref 26.0–34.0)
MCHC: 33.6 g/dL (ref 30.0–36.0)
MCV: 90.9 fL (ref 78.0–100.0)
Platelets: 217 10*3/uL (ref 150–400)
RBC: 3.31 MIL/uL — ABNORMAL LOW (ref 3.87–5.11)
RDW: 14.1 % (ref 11.5–15.5)
WBC: 11.9 10*3/uL — ABNORMAL HIGH (ref 4.0–10.5)

## 2014-12-22 LAB — COMPREHENSIVE METABOLIC PANEL
ALT: 62 U/L — ABNORMAL HIGH (ref 14–54)
ANION GAP: 5 (ref 5–15)
AST: 43 U/L — AB (ref 15–41)
Albumin: 2.5 g/dL — ABNORMAL LOW (ref 3.5–5.0)
Alkaline Phosphatase: 183 U/L — ABNORMAL HIGH (ref 38–126)
BILIRUBIN TOTAL: 0.7 mg/dL (ref 0.3–1.2)
BUN: 5 mg/dL — AB (ref 6–20)
CO2: 23 mmol/L (ref 22–32)
Calcium: 8.3 mg/dL — ABNORMAL LOW (ref 8.9–10.3)
Chloride: 108 mmol/L (ref 101–111)
Creatinine, Ser: 0.45 mg/dL (ref 0.44–1.00)
GFR calc Af Amer: >60 mL/min (ref >60)
Glucose, Bld: 87 mg/dL (ref 65–99)
Potassium: 3.8 mmol/L (ref 3.5–5.1)
Sodium: 136 mmol/L (ref 135–145)
Total Protein: 6.2 g/dL — ABNORMAL LOW (ref 6.5–8.1)

## 2014-12-22 LAB — TYPE AND SCREEN
ABO/RH(D): O NEG
ANTIBODY SCREEN: NEGATIVE

## 2014-12-22 MED ORDER — POLYETHYLENE GLYCOL 3350 17 G PO PACK: 17.0000 g | PACK | Freq: Every day | ORAL | Status: DC | PRN

## 2014-12-22 NOTE — Plan of Care (Signed)
Problem: Consults Goal: Birthing Suites Patient Information Press F2 to bring up selections list   Patient admitted to antenatal for elevated BP/ preeclampsia r/o

## 2014-12-22 NOTE — Progress Notes (Signed)
HD#4 Elevated BPs and mildly abnormal LFTs Pt reports good FM, no ctx or other complaints except for RUQ pain that has remained persistent, mild, not worsening FHT: Cat 1 TOCO: quite SVE: deferred A/P: 35.2 with GHTN and mildly abnormal LFTs BPs stable LFTs stable, not worsening Will obtain RUQ [place] Continue current mgmt

## 2014-12-23 ENCOUNTER — Inpatient Hospital Stay (HOSPITAL_COMMUNITY): Payer: Medicaid Other

## 2014-12-23 LAB — COMPREHENSIVE METABOLIC PANEL
ALT: 53 U/L (ref 14–54)
ANION GAP: 7 (ref 5–15)
AST: 36 U/L (ref 15–41)
Albumin: 2.7 g/dL — ABNORMAL LOW (ref 3.5–5.0)
Alkaline Phosphatase: 188 U/L — ABNORMAL HIGH (ref 38–126)
BILIRUBIN TOTAL: 0.7 mg/dL (ref 0.3–1.2)
BUN: 6 mg/dL (ref 6–20)
CO2: 23 mmol/L (ref 22–32)
Calcium: 8.5 mg/dL — ABNORMAL LOW (ref 8.9–10.3)
Chloride: 105 mmol/L (ref 101–111)
Creatinine, Ser: 0.51 mg/dL (ref 0.44–1.00)
GFR calc Af Amer: >60 mL/min (ref >60)
GLUCOSE: 80 mg/dL (ref 65–99)
Potassium: 4 mmol/L (ref 3.5–5.1)
SODIUM: 135 mmol/L (ref 135–145)
Total Protein: 5.9 g/dL — ABNORMAL LOW (ref 6.5–8.1)

## 2014-12-23 LAB — CBC
HEMATOCRIT: 30.8 % — AB (ref 36.0–46.0)
HEMOGLOBIN: 10.5 g/dL — AB (ref 12.0–15.0)
MCH: 30.8 pg (ref 26.0–34.0)
MCHC: 34.1 g/dL (ref 30.0–36.0)
MCV: 90.3 fL (ref 78.0–100.0)
Platelets: 229 10*3/uL (ref 150–400)
RBC: 3.41 MIL/uL — AB (ref 3.87–5.11)
RDW: 14 % (ref 11.5–15.5)
WBC: 11.9 10*3/uL — AB (ref 4.0–10.5)

## 2014-12-23 NOTE — Progress Notes (Signed)
HD#5 Pt reports some nausea and one episode of vomiting this morning.  RUQ pain is mild.  Good FM, no HA/vision change.  No LOF or VB. FHT: Cat 1 TOCO: quiet SVE: deferred [place]: finding c/s with gallbladder sludge, no definitive cholelithiasis noted LFTs: now normalized A/P: Elevated liver enzymes and mildly elevated BPs BPs have been normal range, LFTs have normalized US showing GB sludge - discussed dietary changes OK to d/c, pt will f/u in office for scheduled appt tues.

## 2014-12-23 NOTE — Discharge Summary (Signed)
  Pt admitted with mildly elevated BPs and LFT also mildly elevated but not double.  Pt received 2 doses of betamethasone and LFTs trended over 5 day stay, ultimately normalized.  As of discharge BPs essentially normal.  US performed d/t nausea nd RUQ pain showing GB sludge.  Pt instructed in dietary changes.  Pt to [facility] Tues for scheduled appt.

## 2015-01-03 ENCOUNTER — Inpatient Hospital Stay (HOSPITAL_COMMUNITY): Payer: Medicaid Other | Admitting: Anesthesiology

## 2015-01-03 ENCOUNTER — Inpatient Hospital Stay (HOSPITAL_COMMUNITY)
Admission: AD | Admit: 2015-01-03 | Discharge: 2015-01-05 | DRG: 775 | Disposition: A | Discharge status: Home / Self Care | Payer: Medicaid Other | Source: Ambulatory Visit | Attending: Obstetrics | Admitting: Obstetrics

## 2015-01-03 ENCOUNTER — Encounter (HOSPITAL_COMMUNITY): Payer: Self-pay

## 2015-01-03 DIAGNOSIS — Z3483 Encounter for supervision of other normal pregnancy, third trimester: Secondary | ICD-10-CM | POA: Diagnosis present

## 2015-01-03 DIAGNOSIS — Z3490 Encounter for supervision of normal pregnancy, unspecified, unspecified trimester: Secondary | ICD-10-CM

## 2015-01-03 DIAGNOSIS — O2662 Liver and biliary tract disorders in childbirth: Principal | ICD-10-CM | POA: Diagnosis present

## 2015-01-03 DIAGNOSIS — O99824 Streptococcus B carrier state complicating childbirth: Secondary | ICD-10-CM | POA: Diagnosis present

## 2015-01-03 DIAGNOSIS — O26613 Liver and biliary tract disorders in pregnancy, third trimester: Secondary | ICD-10-CM

## 2015-01-03 DIAGNOSIS — Z3A37 37 weeks gestation of pregnancy: Secondary | ICD-10-CM | POA: Diagnosis present

## 2015-01-03 DIAGNOSIS — K831 Obstruction of bile duct: Secondary | ICD-10-CM | POA: Diagnosis present

## 2015-01-03 LAB — CBC
HCT: 33.1 % — ABNORMAL LOW (ref 36.0–46.0)
HCT: 31.3 % — ABNORMAL LOW (ref 36.0–46.0)
HCT: 33 % — ABNORMAL LOW (ref 36.0–46.0)
HEMATOCRIT: 31.8 % — AB (ref 36.0–46.0)
Hemoglobin: 11.4 g/dL — ABNORMAL LOW (ref 12.0–15.0)
Hemoglobin: 10.5 g/dL — ABNORMAL LOW (ref 12.0–15.0)
Hemoglobin: 11.3 g/dL — ABNORMAL LOW (ref 12.0–15.0)
Hemoglobin: 10.8 g/dL — ABNORMAL LOW (ref 12.0–15.0)
MCH: 30.6 pg (ref 26.0–34.0)
MCH: 30.3 pg (ref 26.0–34.0)
MCH: 30.9 pg (ref 26.0–34.0)
MCH: 30.6 pg (ref 26.0–34.0)
MCHC: 34.4 g/dL (ref 30.0–36.0)
MCHC: 33.5 g/dL (ref 30.0–36.0)
MCHC: 34.2 g/dL (ref 30.0–36.0)
MCHC: 34 g/dL (ref 30.0–36.0)
MCV: 89 fL (ref 78.0–100.0)
MCV: 90.5 fL (ref 78.0–100.0)
MCV: 90.2 fL (ref 78.0–100.0)
MCV: 90.1 fL (ref 78.0–100.0)
PLATELETS: 183 10*3/uL (ref 150–400)
Platelets: 197 10*3/uL (ref 150–400)
Platelets: 181 10*3/uL (ref 150–400)
Platelets: 198 10*3/uL (ref 150–400)
RBC: 3.72 MIL/uL — ABNORMAL LOW (ref 3.87–5.11)
RBC: 3.46 MIL/uL — ABNORMAL LOW (ref 3.87–5.11)
RBC: 3.66 MIL/uL — AB (ref 3.87–5.11)
RBC: 3.53 MIL/uL — AB (ref 3.87–5.11)
RDW: 14.4 % (ref 11.5–15.5)
RDW: 14.5 % (ref 11.5–15.5)
RDW: 14.6 % (ref 11.5–15.5)
RDW: 14.6 % (ref 11.5–15.5)
WBC: 13.3 10*3/uL — ABNORMAL HIGH (ref 4.0–10.5)
WBC: 10.2 10*3/uL (ref 4.0–10.5)
WBC: 15.2 10*3/uL — ABNORMAL HIGH (ref 4.0–10.5)
WBC: 25.9 10*3/uL — AB (ref 4.0–10.5)

## 2015-01-03 LAB — RPR: RPR: NONREACTIVE

## 2015-01-03 LAB — COMPREHENSIVE METABOLIC PANEL
ALT: 15 U/L (ref 14–54)
AST: 18 U/L (ref 15–41)
Albumin: 2.7 g/dL — ABNORMAL LOW (ref 3.5–5.0)
Alkaline Phosphatase: 210 U/L — ABNORMAL HIGH (ref 38–126)
Anion gap: 6 (ref 5–15)
BUN: 6 mg/dL (ref 6–20)
CALCIUM: 8.4 mg/dL — AB (ref 8.9–10.3)
CHLORIDE: 105 mmol/L (ref 101–111)
CO2: 23 mmol/L (ref 22–32)
Creatinine, Ser: 0.58 mg/dL (ref 0.44–1.00)
GFR calc Af Amer: >60 mL/min (ref >60)
GFR calc non Af Amer: >60 mL/min (ref >60)
GLUCOSE: 100 mg/dL — AB (ref 65–99)
Potassium: 3.5 mmol/L (ref 3.5–5.1)
Sodium: 134 mmol/L — ABNORMAL LOW (ref 135–145)
Total Bilirubin: 0.7 mg/dL (ref 0.3–1.2)
Total Protein: 6.4 g/dL — ABNORMAL LOW (ref 6.5–8.1)

## 2015-01-03 LAB — TYPE AND SCREEN
ABO/RH(D): O NEG
ANTIBODY SCREEN: NEGATIVE

## 2015-01-03 MED ORDER — PENICILLIN G POTASSIUM 5000000 UNITS IJ SOLR
5.0000 10*6.[IU] | Freq: Once | INTRAVENOUS | Status: AC
  Administered 2015-01-03: 5 10*6.[IU] via INTRAVENOUS
  Filled 2015-01-03: qty 5

## 2015-01-03 MED ORDER — CITRIC ACID-SODIUM CITRATE 334-500 MG/5ML PO SOLN: 30.0000 mL | ORAL | Status: DC | PRN

## 2015-01-03 MED ORDER — TERBUTALINE SULFATE 1 MG/ML IJ SOLN
0.2500 mg | Freq: Once | INTRAMUSCULAR | Status: DC | PRN
  Filled 2015-01-03: qty 1

## 2015-01-03 MED ORDER — SODIUM BICARBONATE 8.4 % IV SOLN
INTRAVENOUS | Status: DC | PRN
  Administered 2015-01-03: 5 mL via EPIDURAL

## 2015-01-03 MED ORDER — BUTORPHANOL TARTRATE 1 MG/ML IJ SOLN
1.0000 mg | INTRAMUSCULAR | Status: DC | PRN
  Administered 2015-01-03 (×2): 1 mg via INTRAVENOUS
  Filled 2015-01-03 (×2): qty 1

## 2015-01-03 MED ORDER — OXYTOCIN 40 UNITS IN LACTATED RINGERS INFUSION - SIMPLE MED
1.0000 m[IU]/min | INTRAVENOUS | Status: DC
  Administered 2015-01-03: 2 m[IU]/min via INTRAVENOUS
  Filled 2015-01-03: qty 1000

## 2015-01-03 MED ORDER — OXYCODONE-ACETAMINOPHEN 5-325 MG PO TABS: 2.0000 | ORAL_TABLET | ORAL | Status: DC | PRN

## 2015-01-03 MED ORDER — FENTANYL 2.5 MCG/ML BUPIVACAINE 1/10 % EPIDURAL INFUSION (WH - ANES)
14.0000 mL/h | INTRAMUSCULAR | Status: DC | PRN
  Administered 2015-01-03 (×2): 14 mL/h via EPIDURAL
  Filled 2015-01-03: qty 125

## 2015-01-03 MED ORDER — IPRATROPIUM-ALBUTEROL 0.5-2.5 (3) MG/3ML IN SOLN: 3.0000 mL | Freq: Four times a day (QID) | RESPIRATORY_TRACT | Status: DC | PRN

## 2015-01-03 MED ORDER — OXYTOCIN BOLUS FROM INFUSION
500.0000 mL | INTRAVENOUS | Status: DC
Start: 2015-01-03 — End: 2015-01-03

## 2015-01-03 MED ORDER — PHENYLEPHRINE 40 MCG/ML (10ML) SYRINGE FOR IV PUSH (FOR BLOOD PRESSURE SUPPORT)
80.0000 ug | PREFILLED_SYRINGE | INTRAVENOUS | Status: DC | PRN
  Administered 2015-01-03: 40 ug via INTRAVENOUS
  Filled 2015-01-03: qty 20
  Filled 2015-01-03: qty 2

## 2015-01-03 MED ORDER — LANOLIN HYDROUS EX OINT: TOPICAL_OINTMENT | CUTANEOUS | Status: DC | PRN

## 2015-01-03 MED ORDER — OXYCODONE-ACETAMINOPHEN 5-325 MG PO TABS: 1.0000 | ORAL_TABLET | ORAL | Status: DC | PRN

## 2015-01-03 MED ORDER — ALBUTEROL SULFATE (2.5 MG/3ML) 0.083% IN NEBU: 3.0000 mL | INHALATION_SOLUTION | Freq: Four times a day (QID) | RESPIRATORY_TRACT | Status: DC | PRN

## 2015-01-03 MED ORDER — EPHEDRINE 5 MG/ML INJ
10.0000 mg | INTRAVENOUS | Status: DC | PRN
  Filled 2015-01-03: qty 2

## 2015-01-03 MED ORDER — ZOLPIDEM TARTRATE 5 MG PO TABS: 5.0000 mg | ORAL_TABLET | Freq: Every evening | ORAL | Status: DC | PRN

## 2015-01-03 MED ORDER — ONDANSETRON HCL 4 MG PO TABS: 4.0000 mg | ORAL_TABLET | ORAL | Status: DC | PRN

## 2015-01-03 MED ORDER — TETANUS-DIPHTH-ACELL PERTUSSIS 5-2.5-18.5 LF-MCG/0.5 IM SUSP: 0.5000 mL | Freq: Once | INTRAMUSCULAR | Status: DC

## 2015-01-03 MED ORDER — IPRATROPIUM-ALBUTEROL 0.5-2.5 (3) MG/3ML IN SOLN
3.0000 mL | Freq: Four times a day (QID) | RESPIRATORY_TRACT | Status: DC
  Administered 2015-01-03: 3 mL via RESPIRATORY_TRACT
  Filled 2015-01-03 (×5): qty 3

## 2015-01-03 MED ORDER — LIDOCAINE HCL (PF) 1 % IJ SOLN
INTRAMUSCULAR | Status: DC | PRN
  Administered 2015-01-03 (×2): 4 mL

## 2015-01-03 MED ORDER — OXYTOCIN 40 UNITS IN LACTATED RINGERS INFUSION - SIMPLE MED: 62.5000 mL/h | INTRAVENOUS | Status: DC

## 2015-01-03 MED ORDER — ONDANSETRON HCL 4 MG/2ML IJ SOLN: 4.0000 mg | INTRAMUSCULAR | Status: DC | PRN

## 2015-01-03 MED ORDER — DIPHENHYDRAMINE HCL 50 MG/ML IJ SOLN: 12.5000 mg | INTRAMUSCULAR | Status: DC | PRN

## 2015-01-03 MED ORDER — DIPHENHYDRAMINE HCL 25 MG PO CAPS: 25.0000 mg | ORAL_CAPSULE | Freq: Four times a day (QID) | ORAL | Status: DC | PRN

## 2015-01-03 MED ORDER — LACTATED RINGERS IV SOLN
500.0000 mL | INTRAVENOUS | Status: DC | PRN
  Administered 2015-01-03: 1000 mL via INTRAVENOUS

## 2015-01-03 MED ORDER — ACETAMINOPHEN 325 MG PO TABS: 650.0000 mg | ORAL_TABLET | ORAL | Status: DC | PRN

## 2015-01-03 MED ORDER — OXYCODONE-ACETAMINOPHEN 5-325 MG PO TABS
2.0000 | ORAL_TABLET | ORAL | Status: DC | PRN
  Administered 2015-01-03 – 2015-01-05 (×9): 2 via ORAL
  Filled 2015-01-03 (×9): qty 2

## 2015-01-03 MED ORDER — PENICILLIN G POTASSIUM 5000000 UNITS IJ SOLR
2.5000 10*6.[IU] | INTRAVENOUS | Status: DC
  Administered 2015-01-03: 2.5 10*6.[IU] via INTRAVENOUS
  Filled 2015-01-03 (×5): qty 2.5

## 2015-01-03 MED ORDER — SIMETHICONE 80 MG PO CHEW: 80.0000 mg | CHEWABLE_TABLET | ORAL | Status: DC | PRN

## 2015-01-03 MED ORDER — MISOPROSTOL 25 MCG QUARTER TABLET
25.0000 ug | ORAL_TABLET | ORAL | Status: DC | PRN
  Administered 2015-01-03 (×2): 25 ug via VAGINAL
  Filled 2015-01-03: qty 0.25
  Filled 2015-01-03: qty 1
  Filled 2015-01-03: qty 0.25

## 2015-01-03 MED ORDER — SENNOSIDES-DOCUSATE SODIUM 8.6-50 MG PO TABS
2.0000 | ORAL_TABLET | ORAL | Status: DC
  Administered 2015-01-04: 2 via ORAL
  Filled 2015-01-03 (×2): qty 2

## 2015-01-03 MED ORDER — LACTATED RINGERS IV SOLN
INTRAVENOUS | Status: DC
  Administered 2015-01-03: 02:00:00 via INTRAVENOUS

## 2015-01-03 MED ORDER — PNEUMOCOCCAL VAC POLYVALENT 25 MCG/0.5ML IJ INJ
0.5000 mL | INJECTION | INTRAMUSCULAR | Status: DC
  Filled 2015-01-03: qty 0.5

## 2015-01-03 MED ORDER — OXYCODONE-ACETAMINOPHEN 5-325 MG PO TABS
1.0000 | ORAL_TABLET | ORAL | Status: DC | PRN
  Administered 2015-01-05: 1 via ORAL
  Filled 2015-01-03: qty 1

## 2015-01-03 MED ORDER — PRENATAL MULTIVITAMIN CH
1.0000 | ORAL_TABLET | Freq: Every day | ORAL | Status: DC
  Administered 2015-01-04 – 2015-01-05 (×2): 1 via ORAL
  Filled 2015-01-03 (×2): qty 1

## 2015-01-03 MED ORDER — BENZOCAINE-MENTHOL 20-0.5 % EX AERO
1.0000 "application " | INHALATION_SPRAY | CUTANEOUS | Status: DC | PRN
  Administered 2015-01-03: 1 via TOPICAL
  Filled 2015-01-03: qty 56

## 2015-01-03 MED ORDER — DIBUCAINE 1 % RE OINT: 1.0000 "application " | TOPICAL_OINTMENT | RECTAL | Status: DC | PRN

## 2015-01-03 MED ORDER — IBUPROFEN 600 MG PO TABS
600.0000 mg | ORAL_TABLET | Freq: Four times a day (QID) | ORAL | Status: DC
  Administered 2015-01-03 – 2015-01-05 (×9): 600 mg via ORAL
  Filled 2015-01-03 (×9): qty 1

## 2015-01-03 MED ORDER — ONDANSETRON HCL 4 MG/2ML IJ SOLN: 4.0000 mg | Freq: Four times a day (QID) | INTRAMUSCULAR | Status: DC | PRN

## 2015-01-03 MED ORDER — LIDOCAINE HCL (PF) 1 % IJ SOLN
30.0000 mL | INTRAMUSCULAR | Status: DC | PRN
  Filled 2015-01-03: qty 30

## 2015-01-03 MED ORDER — WITCH HAZEL-GLYCERIN EX PADS: 1.0000 "application " | MEDICATED_PAD | CUTANEOUS | Status: DC | PRN

## 2015-01-03 NOTE — H&P (Signed)
24 y.o. [id number] @ [redacted]w[redacted]d presents for IOL for cholestasis of pregnancy.  She was admitted 2 weeks ago with mildly elevated BPs and LFTs.  She received BMZ x 2 at that time.  LFTs trended down prior to discharge and BPs were  Consistently <140/90 prior to discharge.  She had a RUQ [place] that was unremarkable.  Since that time, she reports progressive pruritis of the palms of hands and soles of feet (though she did not mention these symptoms to her physicians until last week).  Bile acids were collected but are still pending.  She also has elevated LFTs.   Was admitted last night for cervical ripening. Rceiveced cytotec x 2. Contractions are becoming more painful. No prodromal sx of HSV outbreak  Past Medical History  Diagnosis Date  . Asthma   . Herpes simplex of female genitalia     last outbreak 4 months ago  . Migraines   . Headache in pregnancy   . Migraine     Past Surgical History  Procedure Laterality Date  . Colposcopy  03/2014    OB History  Gravida Para Term Preterm AB SAB TAB Ectopic Multiple Living  3 2 2  0 0 0 0 0 0 2    # Outcome Date GA Lbr Len/2nd Weight Sex Delivery Anes PTL Lv  3 Current           2 Term 2009 [redacted]w[redacted]d  3.175 kg (7 lb)  Vag-Spont   Y  1 Term 2007 [redacted]w[redacted]d  3.572 kg (7 lb 14 oz)  Vag-Spont   Y      History   Social History  . Marital Status: Single    Spouse Name: N/A  . Number of Children: 2  . Years of Education: 8 th   Occupational History  .  Unemployed   Social History Main Topics  . Smoking status: Former Smoker -- 0.25 packs/day for 2 years    Types: Cigarettes    Quit date: 05/02/2013  . Smokeless tobacco: Never Used  . Alcohol Use: No     Comment: socially  . Drug Use: No  . Sexual Activity: Yes    Birth Control/ Protection: None   Other Topics Concern  . Not on file   Social History Narrative   Patient is single and lives at home with her family.   Patient is unemployed.   Education 8 th grade.   Right handed.   Caffeine one cup  of coffee daily.   Shellfish allergy    Prenatal Transfer Tool  Maternal Diabetes: No Genetic Screening: Normal Maternal Ultrasounds/Referrals: Normal Fetal Ultrasounds or other Referrals:  None Maternal Substance Abuse:  No Significant Maternal Medications:  Meds include: Other:  ursodiol Significant Maternal Lab Results: Lab values include: Group B Strep positive  ABO, Rh: --/--/O NEG (07/06 0137) Antibody: NEG (07/06 0137) Rubella:  Equivocal RPR: Non Reactive (06/21 1812)  HBsAg: Negative (01/21 0000)  HIV: NONREACTIVE (12/24 0907)  GBS: Positive (06/21 0000)    Other PNC: uncomplicated.    Filed Vitals:   01/03/15 0628  BP: 124/80  Pulse: 93  Temp: 98.3 F (36.8 C)  Resp: 20     General:  NAD Lungs: CTAB Cardiac: RRR Abdomen:  soft, gravid, EFW 6.5-7# Ex:  no edema SSE: by Dr. Rogue Bussing prior to start of induction--no lesions on cervix/vagina/vulva SVE:  FT/70/soft/posterior FHTs:  150s, mod var, + accels, Cat 1 Toco:  q2-4 min   A/P   24 y.o. [redacted]w[redacted]d  [id number] presents for IOL for suspected cholestasis Cholestasis of pregnancy: progressively worsening pruritis in setting of elevated LFTs (most recent AST/ALT 42/57).  Were not repeated on admit last night--will repeat now. Bile acids still pending, but as the clinical presentation is classic for cholestasis, in the setting of elevated LFTs, will proceed with IOL now. IOL: will start pitocin.   Epidural upon maternal request Rh neg: rhogam studies PP FSR/ vtx/ 6.5-7#  [place]

## 2015-01-03 NOTE — Anesthesia Preprocedure Evaluation (Signed)
Anesthesia Evaluation  Patient identified by MRN, date of birth, ID band Patient awake    Reviewed: Allergy & Precautions, NPO status , Patient's Chart, lab work & pertinent test results  History of Anesthesia Complications Negative for: history of anesthetic complications  Airway Mallampati: II  TM Distance: >3 FB Neck ROM: Full    Dental no notable dental hx. (+) Dental Advisory Given   Pulmonary asthma , former smoker,  breath sounds clear to auscultation  Pulmonary exam normal       Cardiovascular hypertension, Pt. on medications Normal cardiovascular examRhythm:Regular Rate:Normal     Neuro/Psych  Headaches, negative psych ROS   GI/Hepatic negative GI ROS, Neg liver ROS,   Endo/Other  obesity  Renal/GU negative Renal ROS  negative genitourinary   Musculoskeletal negative musculoskeletal ROS (+)   Abdominal   Peds negative pediatric ROS (+)  Hematology negative hematology ROS (+)   Anesthesia Other Findings   Reproductive/Obstetrics (+) Pregnancy                             Anesthesia Physical Anesthesia Plan  ASA: III  Anesthesia Plan: Epidural   Post-op Pain Management:    Induction:   Airway Management Planned:   Additional Equipment:   Intra-op Plan:   Post-operative Plan:   Informed Consent: I have reviewed the patients History and Physical, chart, labs and discussed the procedure including the risks, benefits and alternatives for the proposed anesthesia with the patient or authorized representative who has indicated his/her understanding and acceptance.   Dental advisory given  Plan Discussed with: CRNA  Anesthesia Plan Comments:         Anesthesia Quick Evaluation

## 2015-01-03 NOTE — Progress Notes (Signed)
Getting more uncomfortable  SVE: 1/70/-2--tight scar band at internal os--AROM, clear fluid EFM: 140s, mod var, + accels Toco: q3-4 min  Pt w h/o LEEP--scarring at internal os.  Did not tolerate exam well-- I recommend epidural so that I can more effectively break up scar tissue She will think about it.  FSR

## 2015-01-03 NOTE — Progress Notes (Signed)
Epidural catheter left in until cbc md ordered back and called to md.

## 2015-01-03 NOTE — Anesthesia Procedure Notes (Signed)
Epidural Patient location during procedure: OB  Staffing Anesthesiologist: Remington Highbaugh Performed by: anesthesiologist   Preanesthetic Checklist Completed: patient identified, site marked, surgical consent, pre-op evaluation, timeout performed, IV checked, risks and benefits discussed and monitors and equipment checked  Epidural Patient position: sitting Prep: site prepped and draped and DuraPrep Patient monitoring: continuous pulse ox and blood pressure Approach: midline Location: L3-L4 Injection technique: LOR saline  Needle:  Needle type: Tuohy  Needle gauge: 17 G Needle length: 9 cm and 9 Needle insertion depth: 6 cm Catheter type: closed end flexible Catheter size: 19 Gauge Catheter at skin depth: 10 cm Test dose: negative  Assessment Events: blood not aspirated, injection not painful, no injection resistance, negative IV test and no paresthesia  Additional Notes Patient identified. Risks/Benefits/Options discussed with patient including but not limited to bleeding, infection, nerve damage, paralysis, failed block, incomplete pain control, headache, blood pressure changes, nausea, vomiting, reactions to medication both or allergic, itching and postpartum back pain. Confirmed with bedside nurse the patient's most recent platelet count. Confirmed with patient that they are not currently taking any anticoagulation, have any bleeding history or any family history of bleeding disorders. Patient expressed understanding and wished to proceed. All questions were answered. Sterile technique was used throughout the entire procedure. Please see nursing notes for vital signs. Test dose was given through epidural catheter and negative prior to continuing to dose epidural or start infusion. Warning signs of high block given to the patient including shortness of breath, tingling/numbness in hands, complete motor block, or any concerning symptoms with instructions to call for help. Patient was  given instructions on fall risk and not to get out of bed. All questions and concerns addressed with instructions to call with any issues or inadequate analgesia.

## 2015-01-03 NOTE — Progress Notes (Signed)
Comfortable w epidural  EFM: 140s, mod var, occ variable decels w ctx Toco: q2-3 min  SVE: 8/100/0  Rapid cervical change likely etiology of variable decels Anticipate SVD Fetal status overall reassuring

## 2015-01-03 NOTE — Plan of Care (Signed)
Problem: Consults Goal: Birthing Suites Patient Information Press F2 to bring up selections list Outcome: Completed/Met Date Met:  01/03/15  Pt 37-[redacted] weeks EGA and Inpatient induction     

## 2015-01-03 NOTE — Progress Notes (Signed)
pt has been sleeping all night and woke up complaining of contractions and feeling pressure in her rectum. Offered to help her up to the bathroom but she says she doesn't need to.

## 2015-01-03 NOTE — Plan of Care (Signed)
Problem: Phase II Progression Outcomes Goal: Rh isoimmunization per orders Outcome: Progressing Rhogam workup ordered

## 2015-01-04 LAB — CBC
HCT: 27.7 % — ABNORMAL LOW (ref 36.0–46.0)
HEMOGLOBIN: 9.4 g/dL — AB (ref 12.0–15.0)
MCH: 30.7 pg (ref 26.0–34.0)
MCHC: 33.9 g/dL (ref 30.0–36.0)
MCV: 90.5 fL (ref 78.0–100.0)
Platelets: 175 10*3/uL (ref 150–400)
RBC: 3.06 MIL/uL — ABNORMAL LOW (ref 3.87–5.11)
RDW: 14.8 % (ref 11.5–15.5)
WBC: 16.2 10*3/uL — ABNORMAL HIGH (ref 4.0–10.5)

## 2015-01-04 MED ORDER — RHO D IMMUNE GLOBULIN 1500 UNIT/2ML IJ SOSY
300.0000 ug | PREFILLED_SYRINGE | Freq: Once | INTRAMUSCULAR | Status: AC
  Administered 2015-01-04: 300 ug via INTRAVENOUS
  Filled 2015-01-04: qty 2

## 2015-01-04 MED ORDER — URSODIOL 300 MG PO CAPS
300.0000 mg | ORAL_CAPSULE | Freq: Three times a day (TID) | ORAL | Status: DC
  Administered 2015-01-04 – 2015-01-05 (×4): 300 mg via ORAL
  Filled 2015-01-04 (×7): qty 1

## 2015-01-04 MED ORDER — HYDROXYZINE HCL 25 MG PO TABS
25.0000 mg | ORAL_TABLET | Freq: Three times a day (TID) | ORAL | Status: DC | PRN
  Administered 2015-01-04 (×2): 25 mg via ORAL
  Filled 2015-01-04 (×3): qty 1

## 2015-01-04 NOTE — Lactation Note (Signed)
This note was copied from the chart of Katrina [name]. Lactation Consultation Note  P3, Ex BF for 2 years.  Mother has excellent flow of colostrum. Mother hand expressed and pumped 15 ml with manual pump. Demonstrated how to finger syringe feed and baby received 15 ml of pumped breast milk. Due to increasing bilirubin and late preterm suggest mother breastfeed and then supplement with her breast milk after each feeding as much as possible. Mother is cooperative and seems to understand feeding plan. Provided mother with volume guidelines. Offered mother the choice of DEBP or manual and mother chose manual pump and prefers pumping to hand expression. Encouraged her to call if she needs further assistance.     Patient Name: Katrina [name] [id number] Date: 01/04/2015 Reason for consult: Follow-up assessment   Maternal Data    Feeding Feeding Type: Breast Milk Length of feed: 15 min  LATCH Score/Interventions Latch: Repeated attempts needed to sustain latch, nipple held in mouth throughout feeding, stimulation needed to elicit sucking reflex.  Audible Swallowing: A few with stimulation  Type of Nipple: Everted at rest and after stimulation  Comfort (Breast/Nipple): Soft / non-tender     Hold (Positioning): No assistance needed to correctly position infant at breast.  LATCH Score: 8  Lactation Tools Discussed/Used     Consult Status Consult Status: Follow-up Date: 01/05/15 Follow-up type: In-patient    Vivianne Master [facility] 01/04/2015, 3:46 PM

## 2015-01-04 NOTE — Anesthesia Postprocedure Evaluation (Signed)
Anesthesia Post Note  Patient: Katrina [name]  Procedure(s) Performed: * No procedures listed *  Anesthesia type: Epidural  Patient location: Mother/Baby  Post pain: Pain level controlled  Post assessment: Post-op Vital signs reviewed  Last Vitals:  Filed Vitals:   01/04/15 0622  BP: 117/68  Pulse: 79  Temp: 36.6 C  Resp: 20    Post vital signs: Reviewed  Level of consciousness:alert  Complications: No apparent anesthesia complications

## 2015-01-04 NOTE — Progress Notes (Signed)
UR chart review completed.  

## 2015-01-04 NOTE — Lactation Note (Signed)
This note was copied from the chart of Katrina [name]. Lactation Consultation Note Experienced BF mom Bf her 1st child who is 26 yrs old for 2 yrs. BF her 2nd child who is 53 yrs old for 2 1/2 yrs. The 2nd child had a heart shaped tongue but finally BF great. Mom said this baby is BF great. Had just BF and baby getting bath.  Mom says that she hand expresses colostrum before she latches baby. She also has to do a chin tug occasionally d/t baby not opening wide. Referred to Baby and Me Book in Breastfeeding section Pg. 22-23 for position options and Proper latch demonstration. Since baby is 37 wks. And 5lbs. 8oz. Stressed importance of BF frequently at least every 2-3 hrs. And on cueing if before. Documenting I&O, supply and demand.  Gave mom the LPI information sheet d/t under 6 lbs. Educated about newborn behavior. Mom stated she didn't have a supply issue. Baby is + coombs so will have to watch bili levels and lack of interest in BF. Mom encouraged to feed baby 8-12 times/24 hours and with feeding cues. Mom encouraged to waken baby for feeds. Mom encouraged to do skin-to-skin. [place] brochure given w/resources, support groups and [place] services. Patient Name: Katrina [name] [name] Date: 01/04/2015 Reason for consult: Initial assessment   Maternal Data Has patient been taught Hand Expression?: Yes Does the patient have breastfeeding experience prior to this delivery?: Yes  Feeding Feeding Type: Breast Fed Length of feed: 0 min  LATCH Score/Interventions    Intervention(s): Skin to skin;Hand expression  Type of Nipple: Everted at rest and after stimulation  Comfort (Breast/Nipple): Soft / non-tender     Hold (Positioning): No assistance needed to correctly position infant at breast. Intervention(s): Skin to skin;Position options;Support Pillows;Breastfeeding basics reviewed     Lactation Tools Discussed/Used     Consult Status Consult Status: Follow-up Date:  01/05/15 Follow-up type: In-patient    [name], Elta Gu[place] 01/04/2015, 2:14 AM

## 2015-01-04 NOTE — Progress Notes (Signed)
PPD#1 Pt still with the same itching that she had prior to delivery. She is on vistaril and Urisadiol. Baby is doing well VSSAF IMP/ stable Plan/ Routine care/
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
## 2015-01-05 LAB — RH IG WORKUP (INCLUDES ABO/RH)
ABO/RH(D): O NEG
Fetal Screen: NEGATIVE
Gestational Age(Wks): 37
Unit division: 0

## 2015-01-05 MED ORDER — DOCUSATE SODIUM 100 MG PO CAPS: 100.0000 mg | ORAL_CAPSULE | Freq: Two times a day (BID) | ORAL | Status: DC

## 2015-01-05 MED ORDER — OXYCODONE-ACETAMINOPHEN 5-325 MG PO TABS: 2.0000 | ORAL_TABLET | ORAL | Status: DC | PRN

## 2015-01-05 MED ORDER — IBUPROFEN 600 MG PO TABS: 600.0000 mg | ORAL_TABLET | Freq: Four times a day (QID) | ORAL | Status: DC | PRN

## 2015-01-05 NOTE — Progress Notes (Signed)
10:00am: CSW has been in contact with CPS worker, B.Wilson to provide update on infant and [name].  CPS reported that she will arrive at the hospital early afternoon to complete assessment.   11:45 am: CSW met with [name] in order to provide support as she copes with CPS involvement.  [name] reported that she continues to be worried since "you never know what CPS will do".  [name] recognizes that she has engaged in behaviors that demonstrate that she has changed and is prepared to parent this infant; however, she acknowledges that her history does not "look good".  CSW continued to assist the [name] to remind herself of her personal strengths that have assisted her to change.  [name] expressed hope that CPS will be supportive of her changes.  [name] continues to present as coping well with CPS investigation.  [name] discussed goal of focusing on interacting with the infant while she awaits CPS arrival at the hospital in order to distract herself from anxiety.  2:00pm: CSW spoke with CPS worker who reported that [name] continues to be open, honest, and willing to participate in the investigation.  CPS stated that at this time, there are no barriers to infant's discharge to care of the mother. CPS requested that she be contacted when infant is medically ready for discharge so that she can come to the hospital and return to the mother's home to complete home assessment.  CSW agreed to remain in contact with CPS regarding infant's plan of care and to notify her prior to infant's discharge.  3:00pm: CSW met with [name] in order to continue to provide support. [name] smiled as she reflected upon the news that there are no barriers to discharge. She stated that she felt a huge sense of relief when CPS informed her of this news. [name] expressed looking forward to moving on from her past and parenting this infant.  [name] acknowledges that her CPS worker will need to be contacted prior to discharge in order to follow her home from the hospital. She  verbalized understanding, and agreed to contact CSW if needs arise.
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
## 2015-01-05 NOTE — Lactation Note (Signed)
This note was copied from the chart of Katrina Alycia [place]. Lactation Consultation Note  Mother attempting to bf.  Baby sleepy.  Recently had 2 feedings. Mother denies problems or questions regarding breastfeeding. States bf is going well. Reviewed volume guidelines for late preterm.  Mother has been mostly supplementing with formula but has good milk supply. Encouraged her to pump and give baby her breastmilk.  Offered DEBP as did Copy but mother states she prefers manual pump. Encouraged her to call if she needs furthera assistance.    Patient Name: Katrina [name] NATFT'D Date: 01/05/2015 Reason for consult: Follow-up assessment   Maternal Data    Feeding Feeding Type: Bottle Fed - Formula Nipple Type: Slow - flow Length of feed: 25 min  LATCH Score/Interventions                      Lactation Tools Discussed/Used     Consult Status Consult Status: Follow-up Date: 01/06/15 Follow-up type: In-patient    Vivianne Master [facility] 01/05/2015, 4:24 PM

## 2015-01-05 NOTE — Discharge Instructions (Signed)

## 2015-01-05 NOTE — Discharge Summary (Signed)
Obstetric Discharge Summary Reason for Admission: induction of labor Prenatal Procedures: NST and ultrasound Intrapartum Procedures: spontaneous vaginal delivery Postpartum Procedures: none Complications-Operative and Postpartum: none HEMOGLOBIN  Date Value Ref Range Status  01/04/2015 9.4* 12.0 - 15.0 g/dL Final   HCT  Date Value Ref Range Status  01/04/2015 27.7* 36.0 - 46.0 % Final    Physical Exam:  General: alert, cooperative and appears stated age 24: appropriate Uterine Fundus: firm  Discharge Diagnoses: Term Pregnancy-delivered  Discharge Information: Date: 01/05/2015 Activity: pelvic rest Diet: routine Medications: Ibuprofen, Colace and Percocet Condition: improved Instructions: refer to practice specific booklet Discharge to: home Follow-up Information    Follow up with [facility] GEFFEL Carlis Abbott, MD In 4 weeks.   Specialty:  Obstetrics   Why:  For a postpartum evaluation   Contact information:   [address] [address] [phone]       Newborn Data: Live born female  Birth Weight: 5 lb 8.6 oz (2512 g) APGAR: 9, 9  Home with mother.  Katrina Downs H. 01/05/2015, 1:40 PM

## 2015-01-06 ENCOUNTER — Ambulatory Visit: Payer: Self-pay

## 2015-01-06 NOTE — Lactation Note (Signed)
This note was copied from the chart of Katrina [place]. Lactation Consultation Note Experienced BF mom BF baby cradle position, baby laying in moms lap, w/head turned towards mom. Raised HOB, put pillows behind back, instructed to have baby's body face mom, position w/pillows and be comfortable. Documenting short BF and using supplementing. Moms breast are filling. Encouraged to BF longer and massage breast at intervals during BF. Milk is coming in may not need to supplement. Mom wearing supportive bra. Baby is gulping at breast. Encouraged mom not to allow breast to get engorged. Discussed engorgement. Supply and demand, supplementing, and I&O. Patient Name: Katrina [name] [id number] Date: 01/06/2015 Reason for consult: Follow-up assessment;Hyperbilirubinemia   Maternal Data    Feeding Feeding Type: Breast Fed Length of feed: 10 min (still BF)  LATCH Score/Interventions Latch: Grasps breast easily, tongue down, lips flanged, rhythmical sucking. Intervention(s): Adjust position;Breast massage;Breast compression  Audible Swallowing: Spontaneous and intermittent Intervention(s): Skin to skin;Hand expression  Type of Nipple: Everted at rest and after stimulation  Comfort (Breast/Nipple): Filling, red/small blisters or bruises, mild/mod discomfort  Problem noted: Filling Interventions (Filling): Frequent nursing;Firm support;Massage Interventions (Mild/moderate discomfort): Hand expression;Hand massage  Hold (Positioning): Assistance needed to correctly position infant at breast and maintain latch. Intervention(s): Position options;Support Pillows  LATCH Score: 8  Lactation Tools Discussed/Used     Consult Status Consult Status: Complete    Katrina [name] 01/06/2015, 12:17 PM

## 2015-01-14 ENCOUNTER — Inpatient Hospital Stay (HOSPITAL_COMMUNITY): Payer: Medicaid Other

## 2015-01-14 ENCOUNTER — Inpatient Hospital Stay (EMERGENCY_DEPARTMENT_HOSPITAL)
Admission: AD | Admit: 2015-01-14 | Discharge: 2015-01-15 | Disposition: A | Discharge status: Home / Self Care | Payer: Medicaid Other | Source: Ambulatory Visit | Attending: Obstetrics & Gynecology | Admitting: Obstetrics & Gynecology

## 2015-01-14 ENCOUNTER — Encounter (HOSPITAL_COMMUNITY): Payer: Self-pay

## 2015-01-14 DIAGNOSIS — R112 Nausea with vomiting, unspecified: Secondary | ICD-10-CM | POA: Insufficient documentation

## 2015-01-14 DIAGNOSIS — Z87891 Personal history of nicotine dependence: Secondary | ICD-10-CM

## 2015-01-14 DIAGNOSIS — R1011 Right upper quadrant pain: Secondary | ICD-10-CM | POA: Diagnosis not present

## 2015-01-14 DIAGNOSIS — O9963 Diseases of the digestive system complicating the puerperium: Principal | ICD-10-CM | POA: Diagnosis present

## 2015-01-14 DIAGNOSIS — K8010 Calculus of gallbladder with chronic cholecystitis without obstruction: Secondary | ICD-10-CM | POA: Diagnosis present

## 2015-01-14 DIAGNOSIS — I10 Essential (primary) hypertension: Secondary | ICD-10-CM | POA: Diagnosis present

## 2015-01-14 DIAGNOSIS — Z91013 Allergy to seafood: Secondary | ICD-10-CM

## 2015-01-14 DIAGNOSIS — O9989 Other specified diseases and conditions complicating pregnancy, childbirth and the puerperium: Secondary | ICD-10-CM

## 2015-01-14 DIAGNOSIS — J45909 Unspecified asthma, uncomplicated: Secondary | ICD-10-CM | POA: Diagnosis present

## 2015-01-14 DIAGNOSIS — G43909 Migraine, unspecified, not intractable, without status migrainosus: Secondary | ICD-10-CM | POA: Diagnosis present

## 2015-01-14 LAB — CBC WITH DIFFERENTIAL/PLATELET
BASOS ABS: 0 10*3/uL (ref 0.0–0.1)
Basophils Relative: 0 % (ref 0–1)
Eosinophils Absolute: 0.2 10*3/uL (ref 0.0–0.7)
Eosinophils Relative: 3 % (ref 0–5)
HCT: 35.9 % — ABNORMAL LOW (ref 36.0–46.0)
Hemoglobin: 12.5 g/dL (ref 12.0–15.0)
LYMPHS ABS: 1.5 10*3/uL (ref 0.7–4.0)
Lymphocytes Relative: 17 % (ref 12–46)
MCH: 30.6 pg (ref 26.0–34.0)
MCHC: 34.8 g/dL (ref 30.0–36.0)
MCV: 88 fL (ref 78.0–100.0)
Monocytes Absolute: 0.4 10*3/uL (ref 0.1–1.0)
Monocytes Relative: 5 % (ref 3–12)
NEUTROS ABS: 6.4 10*3/uL (ref 1.7–7.7)
NEUTROS PCT: 75 % (ref 43–77)
PLATELETS: 335 10*3/uL (ref 150–400)
RBC: 4.08 MIL/uL (ref 3.87–5.11)
RDW: 13.9 % (ref 11.5–15.5)
WBC: 8.6 10*3/uL (ref 4.0–10.5)

## 2015-01-14 LAB — URINE MICROSCOPIC-ADD ON

## 2015-01-14 LAB — COMPREHENSIVE METABOLIC PANEL
ALT: 13 U/L — ABNORMAL LOW (ref 14–54)
AST: 17 U/L (ref 15–41)
Albumin: 3.9 g/dL (ref 3.5–5.0)
Alkaline Phosphatase: 147 U/L — ABNORMAL HIGH (ref 38–126)
Anion gap: 5 (ref 5–15)
BUN: 8 mg/dL (ref 6–20)
CO2: 26 mmol/L (ref 22–32)
Calcium: 9 mg/dL (ref 8.9–10.3)
Chloride: 104 mmol/L (ref 101–111)
Creatinine, Ser: 0.67 mg/dL (ref 0.44–1.00)
GFR calc Af Amer: >60 mL/min (ref >60)
GFR calc non Af Amer: >60 mL/min (ref >60)
Glucose, Bld: 105 mg/dL — ABNORMAL HIGH (ref 65–99)
Potassium: 3.7 mmol/L (ref 3.5–5.1)
Sodium: 135 mmol/L (ref 135–145)
TOTAL PROTEIN: 7.9 g/dL (ref 6.5–8.1)
Total Bilirubin: 0.9 mg/dL (ref 0.3–1.2)

## 2015-01-14 LAB — URINALYSIS, ROUTINE W REFLEX MICROSCOPIC
Bilirubin Urine: NEGATIVE
GLUCOSE, UA: NEGATIVE mg/dL
KETONES UR: NEGATIVE mg/dL
Nitrite: NEGATIVE
PROTEIN: NEGATIVE mg/dL
Specific Gravity, Urine: 1.01 (ref 1.005–1.030)
Urobilinogen, UA: 0.2 mg/dL (ref 0.0–1.0)
pH: 7 (ref 5.0–8.0)

## 2015-01-14 LAB — AMYLASE: AMYLASE: 44 U/L (ref 28–100)

## 2015-01-14 LAB — PROTEIN / CREATININE RATIO, URINE
Creatinine, Urine: 98 mg/dL
PROTEIN CREATININE RATIO: 0.09 mg/mg{creat} (ref 0.00–0.15)
TOTAL PROTEIN, URINE: 9 mg/dL

## 2015-01-14 LAB — LIPASE, BLOOD: LIPASE: 13 U/L — AB (ref 22–51)

## 2015-01-14 MED ORDER — OXYCODONE-ACETAMINOPHEN 5-325 MG PO TABS: 1.0000 | ORAL_TABLET | Freq: Four times a day (QID) | ORAL | Status: DC | PRN

## 2015-01-14 MED ORDER — ONDANSETRON HCL 4 MG/2ML IJ SOLN
4.0000 mg | Freq: Once | INTRAMUSCULAR | Status: AC
  Administered 2015-01-14: 4 mg via INTRAVENOUS
  Filled 2015-01-14: qty 2

## 2015-01-14 MED ORDER — LACTATED RINGERS IV BOLUS (SEPSIS)
1000.0000 mL | Freq: Once | INTRAVENOUS | Status: AC
  Administered 2015-01-14: 1000 mL via INTRAVENOUS

## 2015-01-14 MED ORDER — ONDANSETRON HCL 4 MG PO TABS: 4.0000 mg | ORAL_TABLET | Freq: Four times a day (QID) | ORAL | Status: DC

## 2015-01-14 MED ORDER — HYDROMORPHONE HCL 1 MG/ML IJ SOLN
1.0000 mg | Freq: Once | INTRAMUSCULAR | Status: AC
  Administered 2015-01-14: 1 mg via INTRAVENOUS
  Filled 2015-01-14: qty 1

## 2015-01-14 NOTE — Discharge Instructions (Signed)
Low-Fat Diet for Pancreatitis or Gallbladder Conditions A low-fat diet can be helpful if you have pancreatitis or a gallbladder condition. With these conditions, your pancreas and gallbladder have trouble digesting fats. A healthy eating plan with less fat will help rest your pancreas and gallbladder and reduce your symptoms. WHAT DO I NEED TO KNOW ABOUT THIS DIET?  Eat a low-fat diet.  Reduce your fat intake to less than 20-30% of your total daily calories. This is less than 50-60 g of fat per day.  Remember that you need some fat in your diet. Ask your dietician what your daily goal should be.  Choose nonfat and low-fat healthy foods. Look for the words "nonfat," "low fat," or "fat free."  As a guide, look on the label and choose foods with less than 3 g of fat per serving. Eat only one serving.  Avoid alcohol.  Do not smoke. If you need help quitting, talk with your health care provider.  Eat small frequent meals instead of three large heavy meals. WHAT FOODS CAN I EAT? Grains Include healthy grains and starches such as potatoes, wheat bread, fiber-rich cereal, and brown rice. Choose whole grain options whenever possible. In adults, whole grains should account for 45-65% of your daily calories.  Fruits and Vegetables Eat plenty of fruits and vegetables. Fresh fruits and vegetables add fiber to your diet. Meats and Other Protein Sources Eat lean meat such as chicken and pork. Trim any fat off of meat before cooking it. Eggs, fish, and beans are other sources of protein. In adults, these foods should account for 10-35% of your daily calories. Dairy Choose low-fat milk and dairy options. Dairy includes fat and protein, as well as calcium.  Fats and Oils Limit high-fat foods such as fried foods, sweets, baked goods, sugary drinks.  Other Creamy sauces and condiments, such as mayonnaise, can add extra fat. Think about whether or not you need to use them, or use smaller amounts or low fat  options. WHAT FOODS ARE NOT RECOMMENDED?  High fat foods, such as:  [employer].  Ice cream.  [place] toast.  Sweet rolls.  Pizza.  Cheese bread.  Foods covered with batter, butter, creamy sauces, or cheese.  Fried foods.  Sugary drinks and desserts.  Foods that cause gas or bloating Document Released: 06/21/2013 Document Reviewed: 06/21/2013 [facility] Patient Information 2015 [place], [place]. This information is not intended to replace advice given to you by your health care provider. Make sure you discuss any questions you have with your health care provider. Cholecystitis  Cholecystitis is swelling and irritation (inflammation) of your gallbladder. This often happens when gallstones or sludge build up in the gallbladder. Treatment is needed right away. [place] care depends on how you were treated. In general:  If you were given antibiotic medicine, take it as told. Finish the medicine even if you start to feel better.  Only take medicines as told by your doctor.  Eat low-fat foods until your next doctor visit.  Keep all doctor visits as told. GET HELP RIGHT AWAY IF:  You have more pain and medicine does not help.  Your pain moves to a different part of your belly (abdomen) or to your back.  You have a fever.  You feel sick to your stomach (nauseous).  You throw up (vomit). MAKE SURE YOU:  Understand these instructions.  Will watch your condition.  Will get help right away if you are not doing well or get worse. Document Released:  06/05/2011 Document Revised: 09/08/2011 Document Reviewed: 06/05/2011 [employer] Patient Information 2015 [place], [place]. This information is not intended to replace advice given to you by your health care provider. Make sure you discuss any questions you have with your health care provider.

## 2015-01-14 NOTE — MAU Provider Note (Signed)
History     CSN: [id number]  Arrival date and time: 01/14/15 2109   First Provider Initiated Contact with Patient 01/14/15 2148      No chief complaint on file.  HPI [name] is a 24 y.o. [phone] who presents to MAU today with complaint of RUQ abdominal pain. She had a NSVD on 01/03/15. She rates pain at 10/10 now. She has been taking Ibuprofen and Percocet for pain with minimal relief. She states pain started on PPD #4 and has become steadily worse. She also states N/V since yesterday. She denies fever, breast pain, headache or edema. She denies complications with the pregnancy or history of HTN.   OB History    Gravida Para Term Preterm AB TAB SAB Ectopic Multiple Living   3 3 3  0 0 0 0 0 0 3      Past Medical History  Diagnosis Date  . Asthma   . Herpes simplex of female genitalia     last outbreak 4 months ago  . Migraines   . Headache in pregnancy   . Migraine     Past Surgical History  Procedure Laterality Date  . Colposcopy  03/2014    Family History  Problem Relation Age of Onset  . Asthma Mother   . Asthma Sister   . Asthma Sister     History  Substance Use Topics  . Smoking status: Former Smoker -- 0.25 packs/day for 2 years    Types: Cigarettes    Quit date: 05/02/2013  . Smokeless tobacco: Never Used  . Alcohol Use: No     Comment: socially    Allergies:  Allergies  Allergen Reactions  . Shellfish Allergy Shortness Of Breath and Swelling    Prescriptions prior to admission  Medication Sig Dispense Refill Last Dose  . albuterol (PROVENTIL HFA;VENTOLIN HFA) 108 (90 BASE) MCG/ACT inhaler Inhale 2 puffs into the lungs every 6 (six) hours as needed for wheezing or shortness of breath.   Past Month at Unknown time  . ibuprofen (ADVIL,MOTRIN) 600 MG tablet Take 1 tablet (600 mg total) by mouth every 6 (six) hours as needed. (Patient taking differently: Take 600 mg by mouth every 6 (six) hours as needed for moderate pain. ) 90 tablet 0  01/14/2015 at 1030  . oxyCODONE-acetaminophen (ROXICET) 5-325 MG per tablet Take 2 tablets by mouth every 4 (four) hours as needed. May take 1-2 tablets every 4-6 hours as needed for pain (Patient taking differently: Take 2 tablets by mouth every 4 (four) hours as needed for severe pain. ) 30 tablet 0 01/14/2015 at 1030  . Prenatal Vit-Fe Fumarate-FA (PRENATAL COMPLETE) 14-0.4 MG TABS Take 1 tablet by mouth daily. (Patient taking differently: Take 2 tablets by mouth daily. ) 30 each 2 01/14/2015 at Unknown time  . promethazine (PHENERGAN) 25 MG tablet Take 25 mg by mouth every 6 (six) hours as needed for nausea or vomiting.   01/14/2015 at Unknown time  . docusate sodium (COLACE) 100 MG capsule Take 1 capsule (100 mg total) by mouth 2 (two) times daily. (Patient not taking: Reported on 01/14/2015) 60 capsule 0     Review of Systems  Constitutional: Negative for fever and malaise/fatigue.  Eyes: Negative for blurred vision.  Cardiovascular: Negative for leg swelling.  Gastrointestinal: Positive for nausea, vomiting and abdominal pain. Negative for diarrhea and constipation.  Neurological: Negative for headaches.   Physical Exam   Blood pressure 133/83, pulse 84, temperature 99.1 F (37.3 C), temperature source  Oral, resp. rate 20, height 5\' 1"  (1.549 m), weight 149 lb (67.586 kg), last menstrual period 04/19/2014, SpO2 100 %, unknown if currently breastfeeding.  Physical Exam  Nursing note and vitals reviewed. Constitutional: She is oriented to person, place, and time. She appears well-developed and well-nourished. No distress.  HENT:  Head: Normocephalic and atraumatic.  Cardiovascular: Normal rate.   Respiratory: Effort normal.  GI: Soft. She exhibits no distension and no mass. There is tenderness (moderate tenderness to palpation of the RUQ of the abdomen). There is no rebound and no guarding.  Neurological: She is alert and oriented to person, place, and time.  Skin: Skin is warm and dry.  No erythema.  Psychiatric: She has a normal mood and affect.   Results for orders placed or performed during the hospital encounter of 01/14/15 (from the past 24 hour(s))  CBC with Differential/Platelet     Status: Abnormal   Collection Time: 01/14/15 10:00 PM  Result Value Ref Range   WBC 8.6 4.0 - 10.5 K/uL   RBC 4.08 3.87 - 5.11 MIL/uL   Hemoglobin 12.5 12.0 - 15.0 g/dL   HCT 35.9 (L) 36.0 - 46.0 %   MCV 88.0 78.0 - 100.0 fL   MCH 30.6 26.0 - 34.0 pg   MCHC 34.8 30.0 - 36.0 g/dL   RDW 13.9 11.5 - 15.5 %   Platelets 335 150 - 400 K/uL   Neutrophils Relative % 75 43 - 77 %   Neutro Abs 6.4 1.7 - 7.7 K/uL   Lymphocytes Relative 17 12 - 46 %   Lymphs Abs 1.5 0.7 - 4.0 K/uL   Monocytes Relative 5 3 - 12 %   Monocytes Absolute 0.4 0.1 - 1.0 K/uL   Eosinophils Relative 3 0 - 5 %   Eosinophils Absolute 0.2 0.0 - 0.7 K/uL   Basophils Relative 0 0 - 1 %   Basophils Absolute 0.0 0.0 - 0.1 K/uL  Comprehensive metabolic panel     Status: Abnormal   Collection Time: 01/14/15 10:00 PM  Result Value Ref Range   Sodium 135 135 - 145 mmol/L   Potassium 3.7 3.5 - 5.1 mmol/L   Chloride 104 101 - 111 mmol/L   CO2 26 22 - 32 mmol/L   Glucose, Bld 105 (H) 65 - 99 mg/dL   BUN 8 6 - 20 mg/dL   Creatinine, Ser 0.67 0.44 - 1.00 mg/dL   Calcium 9.0 8.9 - 10.3 mg/dL   Total Protein 7.9 6.5 - 8.1 g/dL   Albumin 3.9 3.5 - 5.0 g/dL   AST 17 15 - 41 U/L   ALT 13 (L) 14 - 54 U/L   Alkaline Phosphatase 147 (H) 38 - 126 U/L   Total Bilirubin 0.9 0.3 - 1.2 mg/dL   GFR calc non Af Amer >60 >60 mL/min   GFR calc Af Amer >60 >60 mL/min   Anion gap 5 5 - 15  Amylase     Status: None   Collection Time: 01/14/15 10:00 PM  Result Value Ref Range   Amylase 44 28 - 100 U/L  Lipase, blood     Status: Abnormal   Collection Time: 01/14/15 10:00 PM  Result Value Ref Range   Lipase 13 (L) 22 - 51 U/L  Urinalysis, Routine w reflex microscopic (not at [facility])     Status: Abnormal   Collection Time: 01/14/15 10:50  PM  Result Value Ref Range   Color, Urine YELLOW YELLOW   APPearance CLEAR CLEAR  Specific Gravity, Urine 1.010 1.005 - 1.030   pH 7.0 5.0 - 8.0   Glucose, UA NEGATIVE NEGATIVE mg/dL   Hgb urine dipstick TRACE (A) NEGATIVE   Bilirubin Urine NEGATIVE NEGATIVE   Ketones, ur NEGATIVE NEGATIVE mg/dL   Protein, ur NEGATIVE NEGATIVE mg/dL   Urobilinogen, UA 0.2 0.0 - 1.0 mg/dL   Nitrite NEGATIVE NEGATIVE   Leukocytes, UA SMALL (A) NEGATIVE  Protein / creatinine ratio, urine     Status: None   Collection Time: 01/14/15 10:50 PM  Result Value Ref Range   Creatinine, Urine 98.00 mg/dL   Total Protein, Urine 9 mg/dL   Protein Creatinine Ratio 0.09 0.00 - 0.15 mg/mg[Cre]  Urine microscopic-add on     Status: None   Collection Time: 01/14/15 10:50 PM  Result Value Ref Range   Squamous Epithelial / LPF RARE RARE   WBC, UA 0-2 <3 WBC/hpf   RBC / HPF 0-2 <3 RBC/hpf   Bacteria, UA RARE RARE   US Abdomen Limited Ruq  01/14/2015   CLINICAL DATA:  Acute onset of right upper quadrant abdominal pain, nausea and vomiting. Initial encounter.  EXAM: US ABDOMEN LIMITED - RIGHT UPPER QUADRANT  COMPARISON:  Right upper quadrant abdominal ultrasound performed 12/23/2014  FINDINGS: Gallbladder:  No gallstones or wall thickening visualized. Minimal echogenic non-shadowing foci within the gallbladder may reflect echogenic sludge, as previously noted. A positive ultrasonographic Murphy's sign is again noted.  Common bile duct:  Diameter: 0.2 cm, within normal limits in caliber.  Liver:  No focal lesion identified. Within normal limits in parenchymal echogenicity.  IMPRESSION: Persistent positive ultrasonographic Murphy's sign, and stable appearance to minimal apparent sludge within the gallbladder. No evidence for obstruction. The appearance is nonspecific.   Electronically Signed   By: Garald Balding M.D.   On: 01/14/2015 23:38    MAU Course  Procedures None  MDM UA, Urine protein/creatinine ratio, CBC, CMP,  Amylase and Lipase and RUQ limited abdominal US today Discussed patient with Dr. Alwyn Pea. She agrees with plan for work-up for possible PP pre-eclampsia vs cholecystitis  1 liter IV LR, 1 mg Dilaudid IV and 4 mg Zofran IV given Discussed results of labs and [place] with Dr. Alwyn Pea. She recommends discharge at this time with Rx for pain medication. Patient can follow-up in the office and may eventually require a referral to general surgery, but as she is stable today immediate consult is not required.  Assessment and Plan  A: Post partum RUQ abdominal pain  P: Discharge home Rx for Percocet and Zofran given to patient Warning signs for worsening condition discussed Patient advised to follow-up with [facility] as scheduled or sooner PRN Patient may return to MAU as needed or if her condition were to change or worsen  Luvenia Redden, PA-C  01/14/2015, 11:59 PM

## 2015-01-14 NOTE — MAU Note (Signed)
Pt reports she is breastfeeding (s/p SVD on 07/06), states she is having severe pain RUQ/ right side for the last week. Vomiting x 2 days.

## 2015-01-14 NOTE — MAU Note (Signed)
Pt states right upper quadrant pain that she has had since delivery. Developed n/v yesterday and has not been able to keep anything down. Has only peed x1 today. Tried phenergan, percocet and ibuprofen-none of which have helped. N/V and pain worse with eating. Last tried to eat a PB&J sandwich at 5pm and threw it up immediately. Was told she had "gallbladder sludge" during pregnancy. Was tested for cholecystasis and PIH. Denies HA and vision changes.

## 2015-01-15 ENCOUNTER — Inpatient Hospital Stay (HOSPITAL_COMMUNITY)
Admission: EM | Admit: 2015-01-15 | Discharge: 2015-01-19 | DRG: 769 | Disposition: A | Discharge status: Home / Self Care | Payer: Medicaid Other | Attending: General Surgery | Admitting: General Surgery

## 2015-01-15 ENCOUNTER — Encounter (HOSPITAL_COMMUNITY): Payer: Self-pay | Admitting: Emergency Medicine

## 2015-01-15 DIAGNOSIS — J45909 Unspecified asthma, uncomplicated: Secondary | ICD-10-CM | POA: Diagnosis present

## 2015-01-15 DIAGNOSIS — Z419 Encounter for procedure for purposes other than remedying health state, unspecified: Secondary | ICD-10-CM

## 2015-01-15 DIAGNOSIS — O9963 Diseases of the digestive system complicating the puerperium: Secondary | ICD-10-CM | POA: Diagnosis present

## 2015-01-15 DIAGNOSIS — I10 Essential (primary) hypertension: Secondary | ICD-10-CM | POA: Diagnosis present

## 2015-01-15 DIAGNOSIS — K8050 Calculus of bile duct without cholangitis or cholecystitis without obstruction: Secondary | ICD-10-CM | POA: Diagnosis present

## 2015-01-15 DIAGNOSIS — Z87891 Personal history of nicotine dependence: Secondary | ICD-10-CM | POA: Diagnosis not present

## 2015-01-15 DIAGNOSIS — K8010 Calculus of gallbladder with chronic cholecystitis without obstruction: Secondary | ICD-10-CM | POA: Diagnosis present

## 2015-01-15 DIAGNOSIS — G43909 Migraine, unspecified, not intractable, without status migrainosus: Secondary | ICD-10-CM | POA: Diagnosis present

## 2015-01-15 DIAGNOSIS — Z91013 Allergy to seafood: Secondary | ICD-10-CM | POA: Diagnosis not present

## 2015-01-15 DIAGNOSIS — R1011 Right upper quadrant pain: Secondary | ICD-10-CM | POA: Diagnosis not present

## 2015-01-15 LAB — COMPREHENSIVE METABOLIC PANEL
ALBUMIN: 3.9 g/dL (ref 3.5–5.0)
ALT: 13 U/L — ABNORMAL LOW (ref 14–54)
AST: 17 U/L (ref 15–41)
Alkaline Phosphatase: 133 U/L — ABNORMAL HIGH (ref 38–126)
Anion gap: 6 (ref 5–15)
BUN: 9 mg/dL (ref 6–20)
CO2: 25 mmol/L (ref 22–32)
Calcium: 8.9 mg/dL (ref 8.9–10.3)
Chloride: 104 mmol/L (ref 101–111)
Creatinine, Ser: 0.72 mg/dL (ref 0.44–1.00)
GFR calc Af Amer: >60 mL/min (ref >60)
Glucose, Bld: 90 mg/dL (ref 65–99)
POTASSIUM: 3.6 mmol/L (ref 3.5–5.1)
Sodium: 135 mmol/L (ref 135–145)
Total Bilirubin: 0.9 mg/dL (ref 0.3–1.2)
Total Protein: 8.2 g/dL — ABNORMAL HIGH (ref 6.5–8.1)

## 2015-01-15 LAB — URINALYSIS, ROUTINE W REFLEX MICROSCOPIC
BILIRUBIN URINE: NEGATIVE
Glucose, UA: NEGATIVE mg/dL
Hgb urine dipstick: NEGATIVE
Ketones, ur: NEGATIVE mg/dL
LEUKOCYTES UA: NEGATIVE
NITRITE: NEGATIVE
PROTEIN: NEGATIVE mg/dL
Specific Gravity, Urine: 1.028 (ref 1.005–1.030)
Urobilinogen, UA: 1 mg/dL (ref 0.0–1.0)
pH: 6.5 (ref 5.0–8.0)

## 2015-01-15 LAB — CBC WITH DIFFERENTIAL/PLATELET
Basophils Absolute: 0 10*3/uL (ref 0.0–0.1)
Basophils Relative: 0 % (ref 0–1)
EOS PCT: 3 % (ref 0–5)
Eosinophils Absolute: 0.2 10*3/uL (ref 0.0–0.7)
HEMATOCRIT: 38.2 % (ref 36.0–46.0)
HEMOGLOBIN: 12.8 g/dL (ref 12.0–15.0)
LYMPHS PCT: 31 % (ref 12–46)
Lymphs Abs: 2.1 10*3/uL (ref 0.7–4.0)
MCH: 30.3 pg (ref 26.0–34.0)
MCHC: 33.5 g/dL (ref 30.0–36.0)
MCV: 90.3 fL (ref 78.0–100.0)
Monocytes Absolute: 0.3 10*3/uL (ref 0.1–1.0)
Monocytes Relative: 5 % (ref 3–12)
Neutro Abs: 4.1 10*3/uL (ref 1.7–7.7)
Neutrophils Relative %: 61 % (ref 43–77)
Platelets: 351 10*3/uL (ref 150–400)
RBC: 4.23 MIL/uL (ref 3.87–5.11)
RDW: 13.9 % (ref 11.5–15.5)
WBC: 6.8 10*3/uL (ref 4.0–10.5)

## 2015-01-15 LAB — LIPASE, BLOOD: LIPASE: 15 U/L — AB (ref 22–51)

## 2015-01-15 MED ORDER — PANTOPRAZOLE SODIUM 40 MG IV SOLR
40.0000 mg | Freq: Every day | INTRAVENOUS | Status: DC
  Administered 2015-01-15 – 2015-01-17 (×3): 40 mg via INTRAVENOUS
  Filled 2015-01-15 (×4): qty 40

## 2015-01-15 MED ORDER — ONDANSETRON HCL 4 MG/2ML IJ SOLN
4.0000 mg | Freq: Three times a day (TID) | INTRAMUSCULAR | Status: DC | PRN
  Administered 2015-01-15: 4 mg via INTRAVENOUS
  Filled 2015-01-15: qty 2

## 2015-01-15 MED ORDER — SODIUM CHLORIDE 0.9 % IV BOLUS (SEPSIS)
1000.0000 mL | Freq: Once | INTRAVENOUS | Status: AC
  Administered 2015-01-15: 1000 mL via INTRAVENOUS

## 2015-01-15 MED ORDER — MORPHINE SULFATE 2 MG/ML IJ SOLN
2.0000 mg | INTRAMUSCULAR | Status: DC | PRN
  Administered 2015-01-15 (×2): 2 mg via INTRAVENOUS
  Administered 2015-01-16: 6 mg via INTRAVENOUS
  Administered 2015-01-16 (×2): 4 mg via INTRAVENOUS
  Administered 2015-01-16: 6 mg via INTRAVENOUS
  Administered 2015-01-16: 2 mg via INTRAVENOUS
  Administered 2015-01-16: 6 mg via INTRAVENOUS
  Administered 2015-01-16 (×2): 4 mg via INTRAVENOUS
  Administered 2015-01-17 – 2015-01-18 (×9): 6 mg via INTRAVENOUS
  Administered 2015-01-18: 2 mg via INTRAVENOUS
  Administered 2015-01-18 (×4): 6 mg via INTRAVENOUS
  Administered 2015-01-18: 4 mg via INTRAVENOUS
  Administered 2015-01-19 (×2): 6 mg via INTRAVENOUS
  Filled 2015-01-15 (×3): qty 3
  Filled 2015-01-15: qty 1
  Filled 2015-01-15: qty 2
  Filled 2015-01-15: qty 3
  Filled 2015-01-15: qty 2
  Filled 2015-01-15 (×3): qty 3
  Filled 2015-01-15 (×3): qty 1
  Filled 2015-01-15: qty 3
  Filled 2015-01-15: qty 2
  Filled 2015-01-15 (×2): qty 3
  Filled 2015-01-15: qty 1
  Filled 2015-01-15: qty 3
  Filled 2015-01-15: qty 2
  Filled 2015-01-15 (×9): qty 3

## 2015-01-15 MED ORDER — KETOROLAC TROMETHAMINE 30 MG/ML IJ SOLN
30.0000 mg | Freq: Four times a day (QID) | INTRAMUSCULAR | Status: DC
  Filled 2015-01-15 (×3): qty 1

## 2015-01-15 MED ORDER — KCL IN DEXTROSE-NACL 20-5-0.9 MEQ/L-%-% IV SOLN
INTRAVENOUS | Status: DC
  Administered 2015-01-15 – 2015-01-18 (×6): via INTRAVENOUS
  Filled 2015-01-15 (×13): qty 1000

## 2015-01-15 MED ORDER — ALBUTEROL SULFATE HFA 108 (90 BASE) MCG/ACT IN AERS: 2.0000 | INHALATION_SPRAY | Freq: Four times a day (QID) | RESPIRATORY_TRACT | Status: DC | PRN

## 2015-01-15 MED ORDER — ALBUTEROL SULFATE (2.5 MG/3ML) 0.083% IN NEBU: 2.5000 mg | INHALATION_SOLUTION | Freq: Four times a day (QID) | RESPIRATORY_TRACT | Status: DC | PRN

## 2015-01-15 MED ORDER — ONDANSETRON HCL 4 MG/2ML IJ SOLN
4.0000 mg | INTRAMUSCULAR | Status: DC | PRN
  Administered 2015-01-16 – 2015-01-18 (×3): 4 mg via INTRAVENOUS
  Filled 2015-01-15 (×3): qty 2

## 2015-01-15 NOTE — ED Notes (Signed)
Pt husband in bathroom. Will take pt upstairs when husband gets back.

## 2015-01-15 NOTE — ED Notes (Signed)
Pt states that she had vaginal delivery on July 6 and since couple days after she has been having epigastric pain and pain under right breast.  Pt called her OB and was told to take her pain meds as prescribed and pain should go away.  Pt states that she has been taking pain meds but everyday the pain has gotten worse.  Pt states that she went to [facility] hospital last night ans was told it was her gallbladder but they dont due to that there so was discharged.

## 2015-01-15 NOTE — ED Provider Notes (Signed)
CSN: [id number]     Arrival date & time 01/15/15  1422 History   First MD Initiated Contact with Patient 01/15/15 1732     Chief Complaint  Patient presents with  . Abdominal Pain  . Nausea  . Emesis     (Consider location/radiation/quality/duration/timing/severity/associated sxs/prior Treatment) HPI  The patient is a 23 year old female, she recently had her most recent vaginal delivery a couple of weeks ago, shortly after delivery she developed right upper quadrant tenderness and has had significant pain with eating, it is right upper quadrant, over the last several days it has been persistent, she had a visit yesterday to the [facility] where she was evaluated with labs and a right upper quadrant ultrasound, showed some sludge in the gallbladder, positive ultrasonic Murphy sign but no other signs of cholecystitis. The vital signs were unremarkable yesterday. She has persistent pain and vomiting, was unable to hold the medications.  Past Medical History  Diagnosis Date  . Asthma   . Herpes simplex of female genitalia     last outbreak 4 months ago  . Migraines   . Headache in pregnancy   . Migraine    Past Surgical History  Procedure Laterality Date  . Colposcopy  03/2014   Family History  Problem Relation Age of Onset  . Asthma Mother   . Asthma Sister   . Asthma Sister    History  Substance Use Topics  . Smoking status: Former Smoker -- 0.25 packs/day for 2 years    Types: Cigarettes    Quit date: 05/02/2013  . Smokeless tobacco: Never Used  . Alcohol Use: No     Comment: socially   OB History    Gravida Para Term Preterm AB TAB SAB Ectopic Multiple Living   3 3 3  0 0 0 0 0 0 3     Review of Systems  All other systems reviewed and are negative.     Allergies  Shellfish allergy  Home Medications   Prior to Admission medications   Medication Sig Start Date End Date Taking? Authorizing Provider  albuterol (PROVENTIL HFA;VENTOLIN HFA) 108 (90 BASE)  MCG/ACT inhaler Inhale 2 puffs into the lungs every 6 (six) hours as needed for wheezing or shortness of breath.    Historical Provider, MD  docusate sodium (COLACE) 100 MG capsule Take 1 capsule (100 mg total) by mouth 2 (two) times daily. Patient not taking: Reported on 01/14/2015 01/05/15   Vanessa Kick, MD  ibuprofen (ADVIL,MOTRIN) 600 MG tablet Take 1 tablet (600 mg total) by mouth every 6 (six) hours as needed. Patient taking differently: Take 600 mg by mouth every 6 (six) hours as needed for moderate pain.  01/05/15   Vanessa Kick, MD  ondansetron (ZOFRAN) 4 MG tablet Take 1 tablet (4 mg total) by mouth every 6 (six) hours. 01/14/15   Luvenia Redden, PA-C  oxyCODONE-acetaminophen (PERCOCET/ROXICET) 5-325 MG per tablet Take 1-2 tablets by mouth every 6 (six) hours as needed for severe pain. 01/14/15   Luvenia Redden, PA-C  Prenatal Vit-Fe Fumarate-FA (PRENATAL COMPLETE) 14-0.4 MG TABS Take 1 tablet by mouth daily. Patient taking differently: Take 2 tablets by mouth daily.  05/31/14   Lezlie Lye, NP  promethazine (PHENERGAN) 25 MG tablet Take 25 mg by mouth every 6 (six) hours as needed for nausea or vomiting.    Historical Provider, MD   BP 124/83 mmHg  Pulse 61  Temp(Src) 98.2 F (36.8 C) (Oral)  Resp 16  SpO2 100%  LMP 04/19/2014 Physical Exam  Constitutional: She appears well-developed and well-nourished. No distress.  HENT:  Head: Normocephalic and atraumatic.  Mouth/Throat: Oropharynx is clear and moist. No oropharyngeal exudate.  Eyes: Conjunctivae and EOM are normal. Pupils are equal, round, and reactive to light. Right eye exhibits no discharge. Left eye exhibits no discharge. No scleral icterus.  Neck: Normal range of motion. Neck supple. No JVD present. No thyromegaly present.  Cardiovascular: Normal rate, regular rhythm, normal heart sounds and intact distal pulses.  Exam reveals no gallop and no friction rub.   No murmur heard. Pulmonary/Chest: Effort normal and breath sounds  normal. No respiratory distress. She has no wheezes. She has no rales.  Abdominal: Soft. Bowel sounds are normal. She exhibits no distension and no mass. There is tenderness ( Positive Murphy sign, no other significant abdominal tenderness).  Musculoskeletal: Normal range of motion. She exhibits no edema or tenderness.  Lymphadenopathy:    She has no cervical adenopathy.  Neurological: She is alert. Coordination normal.  Skin: Skin is warm and dry. No rash noted. No erythema.  Psychiatric: She has a normal mood and affect. Her behavior is normal.  Nursing note and vitals reviewed.   ED Course  Procedures (including critical care time) Labs Review Labs Reviewed  CBC WITH DIFFERENTIAL/PLATELET  COMPREHENSIVE METABOLIC PANEL  LIPASE, BLOOD  URINALYSIS, ROUTINE W REFLEX MICROSCOPIC (NOT AT [facility])    Imaging Review US Abdomen Limited Ruq  01/14/2015   CLINICAL DATA:  Acute onset of right upper quadrant abdominal pain, nausea and vomiting. Initial encounter.  EXAM: US ABDOMEN LIMITED - RIGHT UPPER QUADRANT  COMPARISON:  Right upper quadrant abdominal ultrasound performed 12/23/2014  FINDINGS: Gallbladder:  No gallstones or wall thickening visualized. Minimal echogenic non-shadowing foci within the gallbladder may reflect echogenic sludge, as previously noted. A positive ultrasonographic Murphy's sign is again noted.  Common bile duct:  Diameter: 0.2 cm, within normal limits in caliber.  Liver:  No focal lesion identified. Within normal limits in parenchymal echogenicity.  IMPRESSION: Persistent positive ultrasonographic Murphy's sign, and stable appearance to minimal apparent sludge within the gallbladder. No evidence for obstruction. The appearance is nonspecific.   Electronically Signed   By: Garald Balding M.D.   On: 01/14/2015 23:38     EKG Interpretation None      MDM   Final diagnoses:  Biliary colic    The patient does not appear to be in distress however she has exquisite  tenderness to the right upper quadrant. We'll obtain general surgical consultation, repeat labs, nothing by mouth. Reviewed ultrasound report from yesterday.  D/w Dr. Zella Richer - he will admit for [place] in the morning - has meds for sx control.  Noemi Chapel, MD 01/16/15 [phone]

## 2015-01-15 NOTE — Progress Notes (Addendum)
Pt had baby girl 1 1/2 weeks ago States no pcp only OB GYN at [place] given a list of [employer] providers to assist with choosing a medicaid MD for f/u care

## 2015-01-15 NOTE — H&P (Signed)
Katrina [name] is an 24 y.o. female.   Chief Complaint:   Recurrent right upper quadrant pain HPI:   This is a 24 year old female who's had worsening right upper quadrant pain. She is 1.5 weeks postpartum. During her pregnancy, she had right upper quadrant pain and was noted to have some elevation of her liver function tests. She also had some hypertension and was on bedrest for a week. The pain is made worse now by eating. She's had 2 ultrasounds would demonstrate hypoechoic areas in the gallbladder consistent with gallbladder sludge. No gallbladder wall thickening. Alkaline phosphatase is mildly elevated on 2 separate tests. Rest of her liver function tests are normal. No leukocytosis. Pain radiates around to the right subscapular area. There is been some nausea, vomiting, and chills.  Past Medical History  Diagnosis Date  . Asthma   . Herpes simplex of female genitalia     last outbreak 4 months ago  . Migraines   . Headache in pregnancy   . Migraine     Past Surgical History  Procedure Laterality Date  . Colposcopy  03/2014    Family History  Problem Relation Age of Onset  . Asthma Mother   . Asthma Sister   . Asthma Sister    Social History:  reports that she quit smoking about 20 months ago. Her smoking use included Cigarettes. She has a .5 pack-year smoking history. She has never used smokeless tobacco. She reports that she does not drink alcohol or use illicit drugs.  Allergies:  Allergies  Allergen Reactions  . Shellfish Allergy Shortness Of Breath and Swelling    Medications Prior to Admission  Medication Sig Dispense Refill  . albuterol (PROVENTIL HFA;VENTOLIN HFA) 108 (90 BASE) MCG/ACT inhaler Inhale 2 puffs into the lungs every 6 (six) hours as needed for wheezing or shortness of breath.    Marland Kitchen ibuprofen (ADVIL,MOTRIN) 600 MG tablet Take 1 tablet (600 mg total) by mouth every 6 (six) hours as needed. (Patient taking differently: Take 600 mg by mouth every 6 (six) hours  as needed for moderate pain. ) 90 tablet 0  . ondansetron (ZOFRAN) 4 MG tablet Take 1 tablet (4 mg total) by mouth every 6 (six) hours. 12 tablet 0  . oxyCODONE-acetaminophen (PERCOCET/ROXICET) 5-325 MG per tablet Take 1-2 tablets by mouth every 6 (six) hours as needed for severe pain. 20 tablet 0  . Prenatal Vit-Fe Fumarate-FA (PRENATAL COMPLETE) 14-0.4 MG TABS Take 1 tablet by mouth daily. (Patient taking differently: Take 2 tablets by mouth daily. ) 30 each 2  . promethazine (PHENERGAN) 25 MG tablet Take 25 mg by mouth every 6 (six) hours as needed for nausea or vomiting.    . docusate sodium (COLACE) 100 MG capsule Take 1 capsule (100 mg total) by mouth 2 (two) times daily. (Patient not taking: Reported on 01/14/2015) 60 capsule 0    Results for orders placed or performed during the hospital encounter of 01/15/15 (from the past 48 hour(s))  Urinalysis, Routine w reflex microscopic (not at [facility])     Status: Abnormal   Collection Time: 01/15/15  5:34 PM  Result Value Ref Range   Color, Urine AMBER (A) YELLOW    Comment: BIOCHEMICALS MAY BE AFFECTED BY COLOR   APPearance CLOUDY (A) CLEAR   Specific Gravity, Urine 1.028 1.005 - 1.030   pH 6.5 5.0 - 8.0   Glucose, UA NEGATIVE NEGATIVE mg/dL   Hgb urine dipstick NEGATIVE NEGATIVE   Bilirubin Urine NEGATIVE NEGATIVE  Ketones, ur NEGATIVE NEGATIVE mg/dL   Protein, ur NEGATIVE NEGATIVE mg/dL   Urobilinogen, UA 1.0 0.0 - 1.0 mg/dL   Nitrite NEGATIVE NEGATIVE   Leukocytes, UA NEGATIVE NEGATIVE    Comment: MICROSCOPIC NOT DONE ON URINES WITH NEGATIVE PROTEIN, BLOOD, LEUKOCYTES, NITRITE, OR GLUCOSE <1000 mg/dL.  CBC with Differential/Platelet     Status: None   Collection Time: 01/15/15  6:00 PM  Result Value Ref Range   WBC 6.8 4.0 - 10.5 K/uL   RBC 4.23 3.87 - 5.11 MIL/uL   Hemoglobin 12.8 12.0 - 15.0 g/dL   HCT 38.2 36.0 - 46.0 %   MCV 90.3 78.0 - 100.0 fL   MCH 30.3 26.0 - 34.0 pg   MCHC 33.5 30.0 - 36.0 g/dL   RDW 13.9 11.5 - 15.5 %    Platelets 351 150 - 400 K/uL   Neutrophils Relative % 61 43 - 77 %   Neutro Abs 4.1 1.7 - 7.7 K/uL   Lymphocytes Relative 31 12 - 46 %   Lymphs Abs 2.1 0.7 - 4.0 K/uL   Monocytes Relative 5 3 - 12 %   Monocytes Absolute 0.3 0.1 - 1.0 K/uL   Eosinophils Relative 3 0 - 5 %   Eosinophils Absolute 0.2 0.0 - 0.7 K/uL   Basophils Relative 0 0 - 1 %   Basophils Absolute 0.0 0.0 - 0.1 K/uL  Comprehensive metabolic panel     Status: Abnormal   Collection Time: 01/15/15  6:00 PM  Result Value Ref Range   Sodium 135 135 - 145 mmol/L   Potassium 3.6 3.5 - 5.1 mmol/L   Chloride 104 101 - 111 mmol/L   CO2 25 22 - 32 mmol/L   Glucose, Bld 90 65 - 99 mg/dL   BUN 9 6 - 20 mg/dL   Creatinine, Ser 0.72 0.44 - 1.00 mg/dL   Calcium 8.9 8.9 - 10.3 mg/dL   Total Protein 8.2 (H) 6.5 - 8.1 g/dL   Albumin 3.9 3.5 - 5.0 g/dL   AST 17 15 - 41 U/L   ALT 13 (L) 14 - 54 U/L   Alkaline Phosphatase 133 (H) 38 - 126 U/L   Total Bilirubin 0.9 0.3 - 1.2 mg/dL   GFR calc non Af Amer >60 >60 mL/min   GFR calc Af Amer >60 >60 mL/min    Comment: (NOTE) The eGFR has been calculated using the CKD EPI equation. This calculation has not been validated in all clinical situations. eGFR's persistently <60 mL/min signify possible Chronic Kidney Disease.    Anion gap 6 5 - 15  Lipase, blood     Status: Abnormal   Collection Time: 01/15/15  6:00 PM  Result Value Ref Range   Lipase 15 (L) 22 - 51 U/L   US Abdomen Limited Ruq  01/14/2015   CLINICAL DATA:  Acute onset of right upper quadrant abdominal pain, nausea and vomiting. Initial encounter.  EXAM: US ABDOMEN LIMITED - RIGHT UPPER QUADRANT  COMPARISON:  Right upper quadrant abdominal ultrasound performed 12/23/2014  FINDINGS: Gallbladder:  No gallstones or wall thickening visualized. Minimal echogenic non-shadowing foci within the gallbladder may reflect echogenic sludge, as previously noted. A positive ultrasonographic Murphy's sign is again noted.  Common bile duct:   Diameter: 0.2 cm, within normal limits in caliber.  Liver:  No focal lesion identified. Within normal limits in parenchymal echogenicity.  IMPRESSION: Persistent positive ultrasonographic Murphy's sign, and stable appearance to minimal apparent sludge within the gallbladder. No evidence for obstruction. The  appearance is nonspecific.   Electronically Signed   By: Garald Balding M.D.   On: 01/14/2015 23:38    Review of Systems  Constitutional: Positive for chills. Negative for fever.  Respiratory: Negative for shortness of breath.   Cardiovascular: Negative for chest pain.  Gastrointestinal: Positive for nausea, vomiting and abdominal pain.  Genitourinary: Negative for hematuria.  Endo/Heme/Allergies: Does not bruise/bleed easily.    Blood pressure 136/68, pulse 62, temperature 98.5 F (36.9 C), temperature source Oral, resp. rate 18, last menstrual period 04/19/2014, SpO2 99 %, unknown if currently breastfeeding. Physical Exam  Constitutional: She appears well-developed and well-nourished. No distress.  HENT:  Head: Normocephalic and atraumatic.  Eyes: No scleral icterus.  Cardiovascular: Normal rate and regular rhythm.   Respiratory: Effort normal and breath sounds normal.  GI: Soft. She exhibits no mass. There is tenderness (RUQ). There is guarding (RUQ).  Musculoskeletal: She exhibits no edema.  Neurological: She is alert.  Skin: Skin is warm and dry.  Psychiatric: She has a normal mood and affect. Her behavior is normal.     Assessment/Plan Recurrent biliary colic that is exacerbated by meals. No clinical or radiographic evidence of acute cholecystitis but could have multiple small gallstones.  Plan: Admit to the hospital. Bowel rest. IV analgesia and IV fluid hydration. HIDA scan in the morning. Possible cholecystectomy this admission.  Kambree Krauss J 01/15/2015, 8:31 PM

## 2015-01-15 NOTE — ED Notes (Signed)
Pt alert and oriented x4. Respirations even and unlabored, bilateral symmetrical rise and fall of chest. Skin warm and dry. In no acute distress. Denies needs.   

## 2015-01-16 ENCOUNTER — Inpatient Hospital Stay (HOSPITAL_COMMUNITY): Payer: Medicaid Other

## 2015-01-16 MED ORDER — TECHNETIUM TC 99M MEBROFENIN IV KIT
5.0000 | PACK | Freq: Once | INTRAVENOUS | Status: AC | PRN
Start: 2015-01-16 — End: 2015-01-16
  Administered 2015-01-16: 5 via INTRAVENOUS

## 2015-01-16 MED ORDER — ACETAMINOPHEN 10 MG/ML IV SOLN
1000.0000 mg | Freq: Four times a day (QID) | INTRAVENOUS | Status: DC
  Administered 2015-01-16 – 2015-01-17 (×3): 1000 mg via INTRAVENOUS
  Filled 2015-01-16 (×7): qty 100

## 2015-01-16 NOTE — Progress Notes (Signed)
Patient ID: Katrina [name], female   DOB: [birth year], 24 y.o.   MRN: [id number]    Subjective: Persistent RUQ pain unchanged.  Nauseated  Objective: Vital signs in last 24 hours: Temp:  [98.2 F (36.8 C)-98.5 F (36.9 C)] 98.3 F (36.8 C) (07/19 0548) Pulse Rate:  [53-63] 60 (07/19 0548) Resp:  [16-20] 18 (07/19 0548) BP: (107-136)/(57-83) 107/57 mmHg (07/19 0548) SpO2:  [99 %-100 %] 100 % (07/19 0548) Weight:  [67.86 kg (149 lb 9.7 oz)] 67.86 kg (149 lb 9.7 oz) (07/18 2300) Last BM Date: 01/13/15  Intake/Output from previous day: 07/18 0701 - 07/19 0700 In: 1000 [I.V.:1000] Out: 400 [Urine:400] Intake/Output this shift:    General appearance: alert, cooperative and mild distress GI: abnormal findings:  moderate tenderness in the RUQ  Lab Results:   Recent Labs  01/14/15 2200 01/15/15 1800  WBC 8.6 6.8  HGB 12.5 12.8  HCT 35.9* 38.2  PLT 335 351   BMET  Recent Labs  01/14/15 2200 01/15/15 1800  NA 135 135  K 3.7 3.6  CL 104 104  CO2 26 25  GLUCOSE 105* 90  BUN 8 9  CREATININE 0.67 0.72  CALCIUM 9.0 8.9     Studies/Results: US Abdomen Limited Ruq  01/14/2015   CLINICAL DATA:  Acute onset of right upper quadrant abdominal pain, nausea and vomiting. Initial encounter.  EXAM: US ABDOMEN LIMITED - RIGHT UPPER QUADRANT  COMPARISON:  Right upper quadrant abdominal ultrasound performed 12/23/2014  FINDINGS: Gallbladder:  No gallstones or wall thickening visualized. Minimal echogenic non-shadowing foci within the gallbladder may reflect echogenic sludge, as previously noted. A positive ultrasonographic Murphy's sign is again noted.  Common bile duct:  Diameter: 0.2 cm, within normal limits in caliber.  Liver:  No focal lesion identified. Within normal limits in parenchymal echogenicity.  IMPRESSION: Persistent positive ultrasonographic Murphy's sign, and stable appearance to minimal apparent sludge within the gallbladder. No evidence for obstruction. The appearance is  nonspecific.   Electronically Signed   By: Garald Balding M.D.   On: 01/14/2015 23:38    Anti-infectives: Anti-infectives    None      Assessment/Plan: Probable biliary colic/early cholecystitis.  GB without stones but "sludge" and typical biliary Sxs post partum. For HIDA.  Likely will require cholecystectomy,.     LOS: 1 day    Rain Friedt T 01/16/2015

## 2015-01-17 ENCOUNTER — Inpatient Hospital Stay (HOSPITAL_COMMUNITY): Payer: Medicaid Other

## 2015-01-17 ENCOUNTER — Inpatient Hospital Stay (HOSPITAL_COMMUNITY): Payer: Medicaid Other | Admitting: Anesthesiology

## 2015-01-17 ENCOUNTER — Encounter (HOSPITAL_COMMUNITY): Payer: Self-pay

## 2015-01-17 ENCOUNTER — Encounter (HOSPITAL_COMMUNITY)
Admission: EM | Disposition: A | Discharge status: Home / Self Care | Payer: Self-pay | Source: Home / Self Care | Attending: General Surgery

## 2015-01-17 HISTORY — PX: CHOLECYSTECTOMY: SHX55

## 2015-01-17 LAB — SURGICAL PCR SCREEN
MRSA, PCR: NEGATIVE
Staphylococcus aureus: NEGATIVE

## 2015-01-17 SURGERY — LAPAROSCOPIC CHOLECYSTECTOMY WITH INTRAOPERATIVE CHOLANGIOGRAM
Anesthesia: General | Site: Abdomen

## 2015-01-17 MED ORDER — BUPIVACAINE-EPINEPHRINE (PF) 0.25% -1:200000 IJ SOLN
INTRAMUSCULAR | Status: AC
  Filled 2015-01-17: qty 30

## 2015-01-17 MED ORDER — SUCCINYLCHOLINE CHLORIDE 20 MG/ML IJ SOLN
INTRAMUSCULAR | Status: DC | PRN
Start: 2015-01-17 — End: 2015-01-17
  Administered 2015-01-17: 80 mg via INTRAVENOUS

## 2015-01-17 MED ORDER — ARTIFICIAL TEARS OP OINT
TOPICAL_OINTMENT | OPHTHALMIC | Status: AC
  Filled 2015-01-17: qty 3.5

## 2015-01-17 MED ORDER — LIDOCAINE HCL (CARDIAC) 20 MG/ML IV SOLN
INTRAVENOUS | Status: DC | PRN
  Administered 2015-01-17: 100 mg via INTRAVENOUS

## 2015-01-17 MED ORDER — LACTATED RINGERS IV SOLN
INTRAVENOUS | Status: DC
  Administered 2015-01-17: 1000 mL via INTRAVENOUS
  Administered 2015-01-17: 15:00:00 via INTRAVENOUS

## 2015-01-17 MED ORDER — ONDANSETRON HCL 4 MG/2ML IJ SOLN
INTRAMUSCULAR | Status: AC
  Filled 2015-01-17: qty 2

## 2015-01-17 MED ORDER — BUPIVACAINE-EPINEPHRINE 0.25% -1:200000 IJ SOLN
INTRAMUSCULAR | Status: DC | PRN
  Administered 2015-01-17: 21 mL

## 2015-01-17 MED ORDER — FENTANYL CITRATE (PF) 100 MCG/2ML IJ SOLN
INTRAMUSCULAR | Status: AC
  Filled 2015-01-17: qty 2

## 2015-01-17 MED ORDER — EPHEDRINE SULFATE 50 MG/ML IJ SOLN
INTRAMUSCULAR | Status: AC
  Filled 2015-01-17: qty 1

## 2015-01-17 MED ORDER — NEOSTIGMINE METHYLSULFATE 10 MG/10ML IV SOLN
INTRAVENOUS | Status: AC
  Filled 2015-01-17: qty 1

## 2015-01-17 MED ORDER — CEFAZOLIN SODIUM-DEXTROSE 2-3 GM-% IV SOLR
2.0000 g | INTRAVENOUS | Status: AC
  Administered 2015-01-17: 2 g via INTRAVENOUS

## 2015-01-17 MED ORDER — SUFENTANIL CITRATE 50 MCG/ML IV SOLN
INTRAVENOUS | Status: AC
  Filled 2015-01-17: qty 1

## 2015-01-17 MED ORDER — LACTATED RINGERS IV SOLN: INTRAVENOUS | Status: DC

## 2015-01-17 MED ORDER — FENTANYL CITRATE (PF) 100 MCG/2ML IJ SOLN
25.0000 ug | INTRAMUSCULAR | Status: DC | PRN
  Administered 2015-01-17: 25 ug via INTRAVENOUS

## 2015-01-17 MED ORDER — CEFAZOLIN SODIUM-DEXTROSE 2-3 GM-% IV SOLR
INTRAVENOUS | Status: AC
  Filled 2015-01-17: qty 50

## 2015-01-17 MED ORDER — HYDROMORPHONE HCL 1 MG/ML IJ SOLN
INTRAMUSCULAR | Status: AC
  Filled 2015-01-17: qty 1

## 2015-01-17 MED ORDER — PROPOFOL 10 MG/ML IV BOLUS
INTRAVENOUS | Status: DC | PRN
  Administered 2015-01-17: 120 mg via INTRAVENOUS

## 2015-01-17 MED ORDER — HYDROCODONE-ACETAMINOPHEN 5-325 MG PO TABS
1.0000 | ORAL_TABLET | ORAL | Status: DC | PRN
  Administered 2015-01-17: 1 via ORAL
  Administered 2015-01-17: 2 via ORAL
  Filled 2015-01-17: qty 1
  Filled 2015-01-17: qty 2

## 2015-01-17 MED ORDER — DEXAMETHASONE SODIUM PHOSPHATE 10 MG/ML IJ SOLN
INTRAMUSCULAR | Status: DC | PRN
  Administered 2015-01-17: 10 mg via INTRAVENOUS

## 2015-01-17 MED ORDER — SODIUM CHLORIDE 0.9 % IJ SOLN
INTRAMUSCULAR | Status: AC
  Filled 2015-01-17: qty 10

## 2015-01-17 MED ORDER — CISATRACURIUM BESYLATE (PF) 10 MG/5ML IV SOLN
INTRAVENOUS | Status: DC | PRN
  Administered 2015-01-17: 6 mg via INTRAVENOUS

## 2015-01-17 MED ORDER — HYDROMORPHONE HCL 1 MG/ML IJ SOLN
0.2500 mg | INTRAMUSCULAR | Status: DC | PRN
  Administered 2015-01-17: 0.25 mg via INTRAVENOUS
  Administered 2015-01-17 (×3): 0.5 mg via INTRAVENOUS
  Administered 2015-01-17: 0.25 mg via INTRAVENOUS

## 2015-01-17 MED ORDER — PRENATAL MULTIVITAMIN CH
1.0000 | ORAL_TABLET | Freq: Every day | ORAL | Status: DC
  Administered 2015-01-18: 1 via ORAL
  Filled 2015-01-17 (×2): qty 1

## 2015-01-17 MED ORDER — LACTATED RINGERS IR SOLN
Status: DC | PRN
  Administered 2015-01-17: 1000 mL

## 2015-01-17 MED ORDER — GLYCOPYRROLATE 0.2 MG/ML IJ SOLN
INTRAMUSCULAR | Status: DC | PRN
  Administered 2015-01-17: 0.6 mg via INTRAVENOUS

## 2015-01-17 MED ORDER — 0.9 % SODIUM CHLORIDE (POUR BTL) OPTIME
TOPICAL | Status: DC | PRN
  Administered 2015-01-17: 1000 mL

## 2015-01-17 MED ORDER — IOHEXOL 300 MG/ML  SOLN
INTRAMUSCULAR | Status: DC | PRN
  Administered 2015-01-17: 3.5 mL

## 2015-01-17 MED ORDER — DEXAMETHASONE SODIUM PHOSPHATE 10 MG/ML IJ SOLN
INTRAMUSCULAR | Status: AC
  Filled 2015-01-17: qty 1

## 2015-01-17 MED ORDER — DOCUSATE SODIUM 100 MG PO CAPS
100.0000 mg | ORAL_CAPSULE | Freq: Three times a day (TID) | ORAL | Status: DC
  Administered 2015-01-18 – 2015-01-19 (×4): 100 mg via ORAL
  Filled 2015-01-17 (×6): qty 1

## 2015-01-17 MED ORDER — DOCUSATE SODIUM 100 MG PO CAPS: 100.0000 mg | ORAL_CAPSULE | Freq: Every day | ORAL | Status: DC | PRN

## 2015-01-17 MED ORDER — FENTANYL CITRATE (PF) 100 MCG/2ML IJ SOLN
25.0000 ug | INTRAMUSCULAR | Status: DC | PRN
  Administered 2015-01-17 (×2): 25 ug via INTRAVENOUS

## 2015-01-17 MED ORDER — OXYCODONE-ACETAMINOPHEN 5-325 MG PO TABS
1.0000 | ORAL_TABLET | ORAL | Status: DC | PRN
Start: 2015-01-17 — End: 2015-01-19
  Administered 2015-01-18 (×2): 1 via ORAL
  Administered 2015-01-18 – 2015-01-19 (×5): 2 via ORAL
  Filled 2015-01-17 (×7): qty 2

## 2015-01-17 MED ORDER — NEOSTIGMINE METHYLSULFATE 10 MG/10ML IV SOLN
INTRAVENOUS | Status: DC | PRN
  Administered 2015-01-17: 4 mg via INTRAVENOUS

## 2015-01-17 MED ORDER — ONDANSETRON HCL 4 MG/2ML IJ SOLN
4.0000 mg | Freq: Once | INTRAMUSCULAR | Status: AC | PRN
  Administered 2015-01-17: 4 mg via INTRAVENOUS

## 2015-01-17 MED ORDER — GLYCOPYRROLATE 0.2 MG/ML IJ SOLN
INTRAMUSCULAR | Status: AC
  Filled 2015-01-17: qty 3

## 2015-01-17 MED ORDER — SUFENTANIL CITRATE 50 MCG/ML IV SOLN
INTRAVENOUS | Status: DC | PRN
  Administered 2015-01-17 (×3): 5 ug via INTRAVENOUS
  Administered 2015-01-17: 10 ug via INTRAVENOUS
  Administered 2015-01-17: 20 ug via INTRAVENOUS
  Administered 2015-01-17: 5 ug via INTRAVENOUS

## 2015-01-17 MED ORDER — PROMETHAZINE HCL 25 MG/ML IJ SOLN
6.2500 mg | INTRAMUSCULAR | Status: DC | PRN
Start: 2015-01-17 — End: 2015-01-17
  Administered 2015-01-17: 6.25 mg via INTRAVENOUS

## 2015-01-17 MED ORDER — ONDANSETRON HCL 4 MG/2ML IJ SOLN
INTRAMUSCULAR | Status: DC | PRN
  Administered 2015-01-17: 4 mg via INTRAVENOUS

## 2015-01-17 MED ORDER — LIDOCAINE HCL (CARDIAC) 20 MG/ML IV SOLN
INTRAVENOUS | Status: AC
  Filled 2015-01-17: qty 5

## 2015-01-17 MED ORDER — PROMETHAZINE HCL 25 MG/ML IJ SOLN
INTRAMUSCULAR | Status: AC
  Filled 2015-01-17: qty 1

## 2015-01-17 MED ORDER — PROPOFOL 10 MG/ML IV BOLUS
INTRAVENOUS | Status: AC
  Filled 2015-01-17: qty 20

## 2015-01-17 SURGICAL SUPPLY — 31 items
APPLIER CLIP ROT 10 11.4 M/L (STAPLE) ×2
APR CLP MED LRG 11.4X10 (STAPLE) ×1
BAG SPEC RTRVL LRG 6X4 10 (ENDOMECHANICALS)
CATH REDDICK CHOLANGI 4FR 50CM (CATHETERS) IMPLANT
CHLORAPREP W/TINT 26ML (MISCELLANEOUS) ×2 IMPLANT
CLIP APPLIE ROT 10 11.4 M/L (STAPLE) ×1 IMPLANT
COVER MAYO STAND STRL (DRAPES) ×2 IMPLANT
COVER SURGICAL LIGHT HANDLE (MISCELLANEOUS) ×1 IMPLANT
DECANTER SPIKE VIAL GLASS SM (MISCELLANEOUS) ×1 IMPLANT
DRAPE C-ARM 42X120 X-RAY (DRAPES) ×2 IMPLANT
DRAPE LAPAROSCOPIC ABDOMINAL (DRAPES) ×2 IMPLANT
ELECT REM PT RETURN 9FT ADLT (ELECTROSURGICAL) ×2
ELECTRODE REM PT RTRN 9FT ADLT (ELECTROSURGICAL) ×1 IMPLANT
GLOVE BIOGEL PI IND STRL 7.5 (GLOVE) ×1 IMPLANT
GLOVE BIOGEL PI INDICATOR 7.5 (GLOVE) ×1
GLOVE ECLIPSE 7.5 STRL STRAW (GLOVE) ×2 IMPLANT
GOWN STRL REUS W/TWL XL LVL3 (GOWN DISPOSABLE) ×8 IMPLANT
HEMOSTAT SNOW SURGICEL 2X4 (HEMOSTASIS) IMPLANT
HEMOSTAT SURGICEL 4X8 (HEMOSTASIS) IMPLANT
KIT BASIN OR (CUSTOM PROCEDURE TRAY) ×2 IMPLANT
LIQUID BAND (GAUZE/BANDAGES/DRESSINGS) ×2 IMPLANT
POUCH SPECIMEN RETRIEVAL 10MM (ENDOMECHANICALS) IMPLANT
SCISSORS LAP 5X35 DISP (ENDOMECHANICALS) ×2 IMPLANT
SET CHOLANGIOGRAPH MIX (MISCELLANEOUS) ×2 IMPLANT
SET IRRIG TUBING LAPAROSCOPIC (IRRIGATION / IRRIGATOR) ×2 IMPLANT
SLEEVE XCEL OPT CAN 5 100 (ENDOMECHANICALS) ×2 IMPLANT
SUT MNCRL AB 4-0 PS2 18 (SUTURE) ×2 IMPLANT
TRAY LAPAROSCOPIC (CUSTOM PROCEDURE TRAY) ×2 IMPLANT
TROCAR BLADELESS OPT 5 100 (ENDOMECHANICALS) ×3 IMPLANT
TROCAR XCEL BLUNT TIP 100MML (ENDOMECHANICALS) ×2 IMPLANT
TROCAR XCEL NON-BLD 11X100MML (ENDOMECHANICALS) ×1 IMPLANT

## 2015-01-17 NOTE — Transfer of Care (Signed)
Immediate Anesthesia Transfer of Care Note  Patient: Katrina [name]  Procedure(s) Performed: Procedure(s): LAPAROSCOPIC CHOLECYSTECTOMY WITH INTRAOPERATIVE CHOLANGIOGRAM (N/A)  Patient Location: PACU  Anesthesia Type:General  Level of Consciousness:  sedated, patient cooperative and responds to stimulation  Airway & Oxygen Therapy:Patient Spontanous Breathing and Patient connected to face mask oxgen  Post-op Assessment:  Report given to PACU RN and Post -op Vital signs reviewed and stable  Post vital signs:  Reviewed and stable  Last Vitals:  Filed Vitals:   01/17/15 1000  BP: 115/69  Pulse: 59  Temp: 37.1 C  Resp: 18    Complications: No apparent anesthesia complications

## 2015-01-17 NOTE — Anesthesia Postprocedure Evaluation (Signed)
  Anesthesia Post-op Note  Patient: Katrina [name]  Procedure(s) Performed: Procedure(s) (LRB): LAPAROSCOPIC CHOLECYSTECTOMY WITH INTRAOPERATIVE CHOLANGIOGRAM (N/A)  Patient Location: PACU  Anesthesia Type: General  Level of Consciousness: awake and alert   Airway and Oxygen Therapy: Patient Spontanous Breathing  Post-op Pain: mild  Post-op Assessment: Post-op Vital signs reviewed, Patient's Cardiovascular Status Stable, Respiratory Function Stable, Patent Airway and No signs of Nausea or vomiting  Last Vitals:  Filed Vitals:   01/17/15 1550  BP:   Pulse: 66  Temp:   Resp: 17    Post-op Vital Signs: stable   Complications: No apparent anesthesia complications

## 2015-01-17 NOTE — Anesthesia Procedure Notes (Signed)
Procedure Name: Intubation Date/Time: 01/17/2015 1:43 PM Performed by: Danley Danker L Patient Re-evaluated:Patient Re-evaluated prior to inductionOxygen Delivery Method: Circle system utilized Preoxygenation: Pre-oxygenation with 100% oxygen Intubation Type: IV induction, Rapid sequence and Cricoid Pressure applied Laryngoscope Size: Miller and 2 Grade View: Grade I Tube type: Oral Tube size: 7.5 mm Number of attempts: 1 Airway Equipment and Method: Stylet Placement Confirmation: ETT inserted through vocal cords under direct vision,  positive ETCO2 and breath sounds checked- equal and bilateral Secured at: 20 cm Tube secured with: Tape Dental Injury: Teeth and Oropharynx as per pre-operative assessment

## 2015-01-17 NOTE — Op Note (Signed)
Preoperative diagnosis: Cholelithiasis and cholecystitis  Postoperative diagnosis: Cholelithiasis and cholecystitis  Surgical procedure: Laparoscopic cholecystectomy with intraoperative cholangiogram  Surgeon: Marland Kitchen T. Renatha Rosen M.D.  Assistant: Jomarie Longs PA  Anesthesia: General Endotracheal  Complications: None  Estimated blood loss: Minimal  Description of procedure: The patient brought to the operating room, placed in the supine position on the operating table, and general endotracheal anesthesia induced. The abdomen was widely sterilely prepped and draped. The patient had received preoperative IV antibiotics and PAS were in place. Patient timeout was performed the correct procedure verified. Standard 4 port technique was used with an open Hassan cannula at the umbilicus and the remainder of the ports placed under direct vision. The gallbladder was visualized. It appeared somewhat distended and edematous with bile staining. The fundus was grasped and elevated up over the liver and the infundibulum retracted inferiolaterally. Peritoneum anterior and posterior to close triangle was incised and fibrofatty tissue stripped off the neck of the gallbladder toward the porta hepatis. The distal gallbladder was thoroughly dissected. The cystic artery was identified in close triangle and the cystic duct gallbladder junction dissected 360.  A good critical view was obtained. When the anatomy was clear the cystic duct was clipped at the gallbladder junction and an operative cholangiogram obtained through the cystic duct. This showed good filling of a normal common bile duct and intrahepatic ducts with free flow into the duodenum and no filling defects. Following this the Cholangiocath was removed and the cystic duct was doubly clipped proximally and divided. The cystic artery was doubly clipped proximally and distally and divided. The gallbladder was dissected free from its bed using hook cautery and  removed through the umbilical port site. Complete hemostasis was obtained in the gallbladder bed. The right upper quadrant was thoroughly irrigated and hemostasis assured. Trochars were removed and all CO2 evacuated and the [facility] trocar site fascial defect closed. Skin incisions were closed with subcuticular Monocryl and Dermabond. Sponge needle and instrument counts were correct. The patient was taken to PACU in good condition.  Derrill Bagnell T  01/17/2015

## 2015-01-17 NOTE — Progress Notes (Signed)
Patient ID: Katrina [name], female   DOB: [birth year], 24 y.o.   MRN: [id number]    Subjective: Still having pressure/cramping right upper quadrant abdominal pain, feels it is worse than yesterday.  Objective: Vital signs in last 24 hours: Temp:  [97.8 F (36.6 C)-98.8 F (37.1 C)] 97.8 F (36.6 C) (07/20 4431) Pulse Rate:  [54-78] 55 (07/20 0608) Resp:  [16-18] 18 (07/20 0608) BP: (108-158)/(44-93) 117/70 mmHg (07/20 0608) SpO2:  [99 %-100 %] 100 % (07/20 5400) Last BM Date: 01/13/15  Intake/Output from previous day: 07/19 0701 - 07/20 0700 In: 3293.3 [I.V.:3293.3] Out: 800 [Urine:800] Intake/Output this shift:    General appearance: alert, cooperative and no distress GI: moderately tender in the epigastrium and right upper quadrant  Lab Results:   Recent Labs  01/14/15 2200 01/15/15 1800  WBC 8.6 6.8  HGB 12.5 12.8  HCT 35.9* 38.2  PLT 335 351   BMET  Recent Labs  01/14/15 2200 01/15/15 1800  NA 135 135  K 3.7 3.6  CL 104 104  CO2 26 25  GLUCOSE 105* 90  BUN 8 9  CREATININE 0.67 0.72  CALCIUM 9.0 8.9     Studies/Results: Nm Hepatobiliary Liver Func  01/16/2015   CLINICAL DATA:  RIGHT upper quadrant pain, nausea and vomiting for several months  EXAM: NUCLEAR MEDICINE HEPATOBILIARY IMAGING  TECHNIQUE: Sequential images of the abdomen were obtained out to 60 minutes following intravenous administration of radiopharmaceutical.  RADIOPHARMACEUTICALS:  5 mCi Tc-3m  Choletec IV  COMPARISON:  None  FINDINGS: Normal tracer extraction from bloodstream indicating normal hepatocellular function.  Prompt excretion of tracer into biliary tree.  Gallbladder visualized at 10 minutes.  Small bowel visualized at 34 minutes.  No focal hepatic retention of tracer.  Small amount of duodenogastric reflux of bile.  Urinary tract excretion of a small amount of de-labeled tracer.  IMPRESSION: Normal hepatocellular function.  Patent biliary tree.   Electronically Signed   By: Lavonia Dana M.D.   On: 01/16/2015 15:45    Anti-infectives: Anti-infectives    None      Assessment/Plan: Persistent acute epigastric and right upper quadrant abdominal pain that is classic for biliary colic. Gallbladder ultrasound is equivocal with "sludge". HIDA is negative. I discussed the findings to date with the patient and her family. She does not have evidence for acute cholecystitis. However she has persistent pain that is classic for biliary colic and I suspect she has biliary pain. We discussed that the diagnosis is not 100% with the above workup. I discussed options of proceeding with laparoscopic cholecystectomy with cholangiogram and attempted to relieve her symptoms versus further workup, i.e. EGD. She feels her pain is not tolerable like to go ahead with surgery. I think this is the best course as a believe her pain is secondary to her gallbladder and further workup would just delay surgery.I discussed the procedure in detail.   We discussed the risks and benefits of a laparoscopic cholecystectomy and possible cholangiogram including, but not limited to bleeding, infection, injury to surrounding structures such as the intestine or liver, bile leak, retained gallstones, need to convert to an open procedure, prolonged diarrhea, blood clots such as  DVT, common bile duct injury, anesthesia risks, and possible need for additional procedures.  The likelihood of improvement in symptoms and return to the patient's normal status is good, but we did discuss that surgery may not relieve her symptoms. We discussed the typical post-operative recovery course.  LOS: 2 days    Labria Wos T 01/17/2015

## 2015-01-17 NOTE — Progress Notes (Signed)
Pt complaining of pain. Rating pain 10/10. Pt has received Morphine 6 mg and pt reports that the morphine brings her pain down to 6/10. MD on call notified. New orders received. Will continue to monitor.

## 2015-01-17 NOTE — Anesthesia Preprocedure Evaluation (Addendum)
Anesthesia Evaluation  Patient identified by MRN, date of birth, ID band Patient awake    Reviewed: Allergy & Precautions, H&P , NPO status , Patient's Chart, lab work & pertinent test results  History of Anesthesia Complications Negative for: history of anesthetic complications  Airway Mallampati: II  TM Distance: >3 FB Neck ROM: Full    Dental no notable dental hx. (+) Dental Advisory Given, Teeth Intact   Pulmonary asthma , former smoker,  breath sounds clear to auscultation  Pulmonary exam normal       Cardiovascular Exercise Tolerance: Good negative cardio ROS Normal cardiovascular examRhythm:Regular Rate:Normal     Neuro/Psych  Headaches, negative neurological ROS  negative psych ROS   GI/Hepatic negative GI ROS, Neg liver ROS,   Endo/Other  negative endocrine ROS  Renal/GU negative Renal ROS  negative genitourinary   Musculoskeletal negative musculoskeletal ROS (+)   Abdominal   Peds negative pediatric ROS (+)  Hematology negative hematology ROS (+)   Anesthesia Other Findings   Reproductive/Obstetrics negative OB ROS                            Anesthesia Physical Anesthesia Plan  ASA: II  Anesthesia Plan: General   Post-op Pain Management:    Induction: Intravenous, Rapid sequence and Cricoid pressure planned  Airway Management Planned: Oral ETT  Additional Equipment:   Intra-op Plan:   Post-operative Plan: Extubation in OR  Informed Consent: I have reviewed the patients History and Physical, chart, labs and discussed the procedure including the risks, benefits and alternatives for the proposed anesthesia with the patient or authorized representative who has indicated his/her understanding and acceptance.   Dental advisory given  Plan Discussed with: CRNA and Surgeon  Anesthesia Plan Comments:        Anesthesia Quick Evaluation

## 2015-01-18 ENCOUNTER — Encounter (HOSPITAL_COMMUNITY): Payer: Self-pay | Admitting: General Surgery

## 2015-01-18 MED ORDER — IBUPROFEN 200 MG PO TABS: 600.0000 mg | ORAL_TABLET | Freq: Four times a day (QID) | ORAL | Status: DC | PRN

## 2015-01-18 MED ORDER — BISACODYL 10 MG RE SUPP
10.0000 mg | Freq: Every day | RECTAL | Status: DC | PRN
  Administered 2015-01-18: 10 mg via RECTAL
  Filled 2015-01-18: qty 1

## 2015-01-18 MED ORDER — PANTOPRAZOLE SODIUM 40 MG PO TBEC
40.0000 mg | DELAYED_RELEASE_TABLET | Freq: Every day | ORAL | Status: DC
  Administered 2015-01-18 – 2015-01-19 (×2): 40 mg via ORAL
  Filled 2015-01-18 (×2): qty 1

## 2015-01-18 MED ORDER — OXYCODONE-ACETAMINOPHEN 5-325 MG PO TABS: 1.0000 | ORAL_TABLET | ORAL | Status: DC | PRN

## 2015-01-18 NOTE — Progress Notes (Signed)
1 Day Post-Op  Subjective: Complaining of pain but better than yesterday.  Soft touch elicits pain response.  She did eat breakfast, but had some nausea with breakfast.    Objective: Vital signs in last 24 hours: Temp:  [98.1 F (36.7 C)-99.3 F (37.4 C)] 99.2 F (37.3 C) (07/21 0524) Pulse Rate:  [57-80] 57 (07/21 0524) Resp:  [13-22] 16 (07/21 0524) BP: (135-159)/(71-96) 136/91 mmHg (07/21 0524) SpO2:  [98 %-100 %] 100 % (07/21 0524) Last BM Date: 01/13/15 Nothing PO recorded Diet: regular TM 99.3 BP up some No labs Intake/Output from previous day: 07/20 0701 - 07/21 0700 In: 3900 [I.V.:3900] Out: 1700 [Urine:1700] Intake/Output this shift:    General appearance: alert, cooperative and no distress GI: soft, very tender, sites all look fine.  Lab Results:   Recent Labs  01/15/15 1800  WBC 6.8  HGB 12.8  HCT 38.2  PLT 351    BMET  Recent Labs  01/15/15 1800  NA 135  K 3.6  CL 104  CO2 25  GLUCOSE 90  BUN 9  CREATININE 0.72  CALCIUM 8.9   PT/INR No results for input(s): LABPROT, INR in the last 72 hours.   Recent Labs Lab 01/14/15 2200 01/15/15 1800  AST 17 17  ALT 13* 13*  ALKPHOS 147* 133*  BILITOT 0.9 0.9  PROT 7.9 8.2*  ALBUMIN 3.9 3.9     Lipase     Component Value Date/Time   LIPASE 15* 01/15/2015 1800     Studies/Results: Dg Cholangiogram Operative  01/17/2015   CLINICAL DATA:  Gallbladder sludge  EXAM: INTRAOPERATIVE CHOLANGIOGRAM  TECHNIQUE: Cholangiographic images from the C-arm fluoroscopic device were submitted for interpretation post-operatively. Please see the procedural report for the amount of contrast and the fluoroscopy time utilized.  COMPARISON:  Scintigraphy 01/16/2015 and earlier studies  FINDINGS: No persistent filling defects in the common duct. Intrahepatic ducts are incompletely visualized, appearing decompressed centrally. Contrast passes into the duodenum.  : Negative for retained common duct stone.    Electronically Signed   By: Lucrezia Europe M.D.   On: 01/17/2015 15:39   Nm Hepatobiliary Liver Func  01/16/2015   CLINICAL DATA:  RIGHT upper quadrant pain, nausea and vomiting for several months  EXAM: NUCLEAR MEDICINE HEPATOBILIARY IMAGING  TECHNIQUE: Sequential images of the abdomen were obtained out to 60 minutes following intravenous administration of radiopharmaceutical.  RADIOPHARMACEUTICALS:  5 mCi Tc-38m  Choletec IV  COMPARISON:  None  FINDINGS: Normal tracer extraction from bloodstream indicating normal hepatocellular function.  Prompt excretion of tracer into biliary tree.  Gallbladder visualized at 10 minutes.  Small bowel visualized at 34 minutes.  No focal hepatic retention of tracer.  Small amount of duodenogastric reflux of bile.  Urinary tract excretion of a small amount of de-labeled tracer.  IMPRESSION: Normal hepatocellular function.  Patent biliary tree.   Electronically Signed   By: Lavonia Dana M.D.   On: 01/16/2015 15:45    Medications: . docusate sodium  100 mg Oral TID BM  . pantoprazole (PROTONIX) IV  40 mg Intravenous QHS  . prenatal multivitamin  1 tablet Oral Q1200   . dextrose 5 % and 0.9 % NaCl with KCl 20 mEq/L 100 mL/hr at 01/18/15 0820   Prior to Admission medications   Medication Sig Start Date End Date Taking? Authorizing Provider  albuterol (PROVENTIL HFA;VENTOLIN HFA) 108 (90 BASE) MCG/ACT inhaler Inhale 2 puffs into the lungs every 6 (six) hours as needed for wheezing or shortness of breath.  Yes Historical Provider, MD  ibuprofen (ADVIL,MOTRIN) 600 MG tablet Take 1 tablet (600 mg total) by mouth every 6 (six) hours as needed. Patient taking differently: Take 600 mg by mouth every 6 (six) hours as needed for moderate pain.  01/05/15  Yes Vanessa Kick, MD  ondansetron (ZOFRAN) 4 MG tablet Take 1 tablet (4 mg total) by mouth every 6 (six) hours. 01/14/15  Yes Luvenia Redden, PA-C  oxyCODONE-acetaminophen (PERCOCET/ROXICET) 5-325 MG per tablet Take 1-2 tablets by  mouth every 6 (six) hours as needed for severe pain. 01/14/15  Yes Luvenia Redden, PA-C  Prenatal Vit-Fe Fumarate-FA (PRENATAL COMPLETE) 14-0.4 MG TABS Take 1 tablet by mouth daily. Patient taking differently: Take 2 tablets by mouth daily.  05/31/14  Yes Lezlie Lye, NP  promethazine (PHENERGAN) 25 MG tablet Take 25 mg by mouth every 6 (six) hours as needed for nausea or vomiting.   Yes Historical Provider, MD  docusate sodium (COLACE) 100 MG capsule Take 1 capsule (100 mg total) by mouth 2 (two) times daily. Patient not taking: Reported on 01/14/2015 01/05/15   Vanessa Kick, MD     Assessment/Plan Cholelithiasis and cholecystitis Laparoscopic cholecystectomy with intraoperative cholangiogram, 01/17/15, Darene Lamer. Hoxworth M.D. Post partum 1.5 weeks Hx of asthma Hx of Herpes simplex of female genitalia Hx of Migraines Antibiotics:  preop only DVT:  SCD   Plan;  Put her back on Percocet and ibuprofen for pain and home later today after up and eating.     LOS: 3 days    Joniel Graumann 01/18/2015

## 2015-01-18 NOTE — Discharge Instructions (Signed)
Laparoscopic Cholecystectomy, Care After °Refer to this sheet in the next few weeks. These instructions provide you with information on caring for yourself after your procedure. Your health care provider may also give you more specific instructions. Your treatment has been planned according to current medical practices, but problems sometimes occur. Call your health care provider if you have any problems or questions after your procedure. °WHAT TO EXPECT AFTER THE PROCEDURE °After your procedure, it is typical to have the following: °· Pain at your incision sites. You will be given pain medicines to control the pain. °· Mild nausea or vomiting. This should improve after the first 24 hours. °· Bloating and possibly shoulder pain from the gas used during the procedure. This will improve after the first 24 hours. °HOME CARE INSTRUCTIONS  °· Change bandages (dressings) as directed by your health care provider. °· Keep the wound dry and clean. You may wash the wound gently with soap and water. Gently blot or dab the area dry. °· Do not take baths or use swimming pools or hot tubs for 2 weeks or until your health care provider approves. °· Only take over-the-counter or prescription medicines as directed by your health care provider. °· Continue your normal diet as directed by your health care provider. °· Do not lift anything heavier than 10 pounds (4.5 kg) until your health care provider approves. °· Do not play contact sports for 1 week or until your health care provider approves. °SEEK MEDICAL CARE IF:  °· You have redness, swelling, or increasing pain in the wound. °· You notice yellowish-white fluid (pus) coming from the wound. °· You have drainage from the wound that lasts longer than 1 day. °· You notice a bad smell coming from the wound or dressing. °· Your surgical cuts (incisions) break open. °SEEK IMMEDIATE MEDICAL CARE IF:  °· You develop a rash. °· You have difficulty breathing. °· You have chest pain. °· You  have a fever. °· You have increasing pain in the shoulders (shoulder strap areas). °· You have dizzy episodes or faint while standing. °· You have severe abdominal pain. °· You feel sick to your stomach (nauseous) or throw up (vomit) and this lasts for more than 1 day. °Document Released: 06/16/2005 Document Revised: 04/06/2013 Document Reviewed: 01/26/2013 °[employer]® Patient Information ©2015 [employer]. This information is not intended to replace advice given to you by your health care provider. Make sure you discuss any questions you have with your health care provider. ° °[facility] ______[facility] °LAPAROSCOPIC SURGERY: POST OP INSTRUCTIONS °Always review your discharge instruction sheet given to you by the facility where your surgery was performed. °IF YOU HAVE DISABILITY OR FAMILY LEAVE FORMS, YOU MUST BRING THEM TO THE OFFICE FOR PROCESSING.   °DO NOT GIVE THEM TO YOUR DOCTOR. ° °1. A prescription for pain medication may be given to you upon discharge.  Take your pain medication as prescribed, if needed.  If narcotic pain medicine is not needed, then you may take acetaminophen (Tylenol) or ibuprofen (Advil) as needed. °2. Take your usually prescribed medications unless otherwise directed. °3. If you need a refill on your pain medication, please contact your pharmacy.  They will contact our office to request authorization. Prescriptions will not be filled after 5pm or on week-ends. °4. You should follow a light diet the first few days after arrival home, such as soup and crackers, etc.  Be sure to include lots of fluids daily. °5. Most patients will experience some   swelling and bruising in the area of the incisions.  Ice packs will help.  Swelling and bruising can take several days to resolve.  °6. It is common to experience some constipation if taking pain medication after surgery.  Increasing fluid intake and taking a stool softener (such as Colace) will usually help or prevent this problem  from occurring.  A mild laxative (Milk of Magnesia or Miralax) should be taken according to package instructions if there are no bowel movements after 48 hours. °7. Unless discharge instructions indicate otherwise, you may remove your bandages 24-48 hours after surgery, and you may shower at that time.  You may have steri-strips (small skin tapes) in place directly over the incision.  These strips should be left on the skin for 7-10 days.  If your surgeon used skin glue on the incision, you may shower in 24 hours.  The glue will flake off over the next 2-3 weeks.  Any sutures or staples will be removed at the office during your follow-up visit. °8. ACTIVITIES:  You may resume regular (light) daily activities beginning the next day--such as daily self-care, walking, climbing stairs--gradually increasing activities as tolerated.  You may have sexual intercourse when it is comfortable.  Refrain from any heavy lifting or straining until approved by your doctor. °a. You may drive when you are no longer taking prescription pain medication, you can comfortably wear a seatbelt, and you can safely maneuver your car and apply brakes. °b. RETURN TO WORK:  __________________________________________________________ °9. You should see your doctor in the office for a follow-up appointment approximately 2-3 weeks after your surgery.  Make sure that you call for this appointment within a day or two after you arrive home to insure a convenient appointment time. °10. OTHER INSTRUCTIONS: __________________________________________________________________________________________________________________________ __________________________________________________________________________________________________________________________ °WHEN TO CALL YOUR DOCTOR: °1. Fever over 101.0 °2. Inability to urinate °3. Continued bleeding from incision. °4. Increased pain, redness, or drainage from the incision. °5. Increasing abdominal pain ° °The  clinic staff is available to answer your questions during regular business hours.  Please don’t hesitate to call and ask to speak to one of the nurses for clinical concerns.  If you have a medical emergency, go to the nearest emergency room or call 911.  A surgeon from [facility] is always on call at the hospital. °[address], [address], [place], [place]  [address] ? [address] [address], [place], [place]   [address] °[phone] ? [phone] ? FAX [phone] °Web site: [email or web address] °

## 2015-01-19 NOTE — Progress Notes (Signed)
Patient tolerating diet, vitals WNLs and pain controlled with PO pain medicine. Discussed discharge instructions with patient. Patient had no questions nor concerns. Discharged to home.

## 2015-01-19 NOTE — Progress Notes (Signed)
2 Days Post-Op  Subjective: Doing well walking to gift shop, tolerating diet  Objective: Vital signs in last 24 hours: Temp:  [97.7 F (36.5 C)-99.3 F (37.4 C)] 97.7 F (36.5 C) (07/22 0600) Pulse Rate:  [59-75] 59 (07/22 0600) Resp:  [16-18] 16 (07/22 0600) BP: (120-131)/(68-81) 129/71 mmHg (07/22 0600) SpO2:  [100 %] 100 % (07/22 0600) Last BM Date: 01/18/15 720 Po regular diet Afebrile, VSS Diagnosis Gallbladder - CHRONIC CHOLECYSTITIS, CHOLELITHIASIS, AND CHOLESTEROLOSIS.   Intake/Output from previous day: 07/21 0701 - 07/22 0700 In: 720 [P.O.:720] Out: 1650 [Urine:1650] Intake/Output this shift:    General appearance: alert, cooperative and no distress GI: soft sore, port sites look fine.  Lab Results:  No results for input(s): WBC, HGB, HCT, PLT in the last 72 hours.  BMET No results for input(s): NA, K, CL, CO2, GLUCOSE, BUN, CREATININE, CALCIUM in the last 72 hours. PT/INR No results for input(s): LABPROT, INR in the last 72 hours.   Recent Labs Lab 01/14/15 2200 01/15/15 1800  AST 17 17  ALT 13* 13*  ALKPHOS 147* 133*  BILITOT 0.9 0.9  PROT 7.9 8.2*  ALBUMIN 3.9 3.9     Lipase     Component Value Date/Time   LIPASE 15* 01/15/2015 1800     Studies/Results: Dg Cholangiogram Operative  01/17/2015   CLINICAL DATA:  Gallbladder sludge  EXAM: INTRAOPERATIVE CHOLANGIOGRAM  TECHNIQUE: Cholangiographic images from the C-arm fluoroscopic device were submitted for interpretation post-operatively. Please see the procedural report for the amount of contrast and the fluoroscopy time utilized.  COMPARISON:  Scintigraphy 01/16/2015 and earlier studies  FINDINGS: No persistent filling defects in the common duct. Intrahepatic ducts are incompletely visualized, appearing decompressed centrally. Contrast passes into the duodenum.  : Negative for retained common duct stone.   Electronically Signed   By: Lucrezia Europe M.D.   On: 01/17/2015 15:39    Medications: .  docusate sodium  100 mg Oral TID BM  . pantoprazole  40 mg Oral Daily  . prenatal multivitamin  1 tablet Oral Q1200    Assessment/Plan Cholelithiasis and cholecystitis Laparoscopic cholecystectomy with intraoperative cholangiogram, 01/17/15, Darene Lamer. Hoxworth M.D. Post partum 1.5 weeks Hx of asthma Hx of Herpes simplex of female genitalia Hx of Migraines Antibiotics: preop only DVT: SCD    Plan:  Home today   LOS: 4 days    Pricsilla Lindvall 01/19/2015

## 2015-01-19 NOTE — Discharge Summary (Signed)
Physician Discharge Summary  Patient ID: Katrina [name] MRN: [id number] DOB/AGE: 1991-02-07 24 y.o.  Admit date: 01/15/2015 Discharge date: 01/19/2015  Admission Diagnoses: * Cholelithiasis and cholecystitis Post partum 1.5 weeks Hx of asthma Hx of Herpes simplex of female genitalia Hx of Migraines  Discharge Diagnoses:  Cholelithiasis and cholecystitis Post partum 1.5 weeks Hx of asthma Hx of Herpes simplex of female genitalia Hx of Migraines  Active Problems:   Biliary colic   Recurrent biliary colic   PROCEDURES: Laparoscopic cholecystectomy with intraoperative cholangiogram, 01/17/15, Marland Kitchen T. Hoxworth M.D  Hospital Course:  This is a 24 year old female who's had worsening right upper quadrant pain. She is 1.5 weeks postpartum. During her pregnancy, she had right upper quadrant pain and was noted to have some elevation of her liver function tests. She also had some hypertension and was on bedrest for a week. The pain is made worse now by eating. She's had 2 ultrasounds would demonstrate hypoechoic areas in the gallbladder consistent with gallbladder sludge. No gallbladder wall thickening. Alkaline phosphatase is mildly elevated on 2 separate tests. Rest of her liver function tests are normal. No leukocytosis. Pain radiates around to the right subscapular area. There is been some nausea, vomiting, and chills She was seen in the ED and admitted by Dr. Zella Richer who obtained a HIDA scan.  HIDA scan was normal but she was so symptomatic that Dr. Excell Seltzer took her to the OR for surgery on 01/17/15.  Pathology confirmed; "CHRONIC CHOLECYSTITIS, CHOLELITHIASIS, AND CHOLESTEROLOSIS."  Post op she had some ongoing discomfort related to the port sites.  We kept her till AM of 7/22 before discharge.  At this point she had a small BM, port sites looked fine and she was tolerating regular diet.    Condition on D/C:  Improved    Disposition: 01-Home or Self Care     Medication List     STOP taking these medications        promethazine 25 MG tablet  Commonly known as:  PHENERGAN      TAKE these medications        albuterol 108 (90 BASE) MCG/ACT inhaler  Commonly known as:  PROVENTIL HFA;VENTOLIN HFA  Inhale 2 puffs into the lungs every 6 (six) hours as needed for wheezing or shortness of breath.     docusate sodium 100 MG capsule  Commonly known as:  COLACE  Take 1 capsule (100 mg total) by mouth 2 (two) times daily.     ibuprofen 600 MG tablet  Commonly known as:  ADVIL,MOTRIN  Take 1 tablet (600 mg total) by mouth every 6 (six) hours as needed.     ondansetron 4 MG tablet  Commonly known as:  ZOFRAN  Take 1 tablet (4 mg total) by mouth every 6 (six) hours.     oxyCODONE-acetaminophen 5-325 MG per tablet  Commonly known as:  PERCOCET/ROXICET  Take 1-2 tablets by mouth every 6 (six) hours as needed for severe pain.     oxyCODONE-acetaminophen 5-325 MG per tablet  Commonly known as:  PERCOCET/ROXICET  Take 1-2 tablets by mouth every 4 (four) hours as needed (pain).     PRENATAL COMPLETE 14-0.4 MG Tabs  Take 1 tablet by mouth daily.           Follow-up Information    Follow up with [facility] .   Contact information:   Please use the list of Medicaid providers to find a doctor as needed for follow up care  Follow up with [facility] [place] On 02/06/2015.   Why:  Your appointment is at 2 PM, be there 30 minutes early for check in.   Contact information:   [place] [address] [phone]      Signed: Earnstine Regal 01/19/2015, 12:26 PM

## 2015-01-30 ENCOUNTER — Other Ambulatory Visit: Payer: Self-pay | Admitting: Surgery

## 2015-01-30 DIAGNOSIS — Z9049 Acquired absence of other specified parts of digestive tract: Secondary | ICD-10-CM

## 2015-01-30 DIAGNOSIS — R1031 Right lower quadrant pain: Secondary | ICD-10-CM

## 2015-01-31 ENCOUNTER — Encounter (HOSPITAL_COMMUNITY): Payer: Self-pay | Admitting: *Deleted

## 2015-01-31 ENCOUNTER — Emergency Department (HOSPITAL_COMMUNITY): Payer: Medicaid Other

## 2015-01-31 ENCOUNTER — Telehealth: Payer: Self-pay | Admitting: General Surgery

## 2015-01-31 ENCOUNTER — Inpatient Hospital Stay: Admission: RE | Admit: 2015-01-31 | Payer: Medicaid Other | Source: Ambulatory Visit

## 2015-01-31 ENCOUNTER — Emergency Department (HOSPITAL_COMMUNITY)
Admission: EM | Admit: 2015-01-31 | Discharge: 2015-01-31 | Disposition: A | Discharge status: Home / Self Care | Payer: Medicaid Other | Attending: Emergency Medicine | Admitting: Emergency Medicine

## 2015-01-31 DIAGNOSIS — Z79899 Other long term (current) drug therapy: Secondary | ICD-10-CM | POA: Diagnosis not present

## 2015-01-31 DIAGNOSIS — J45909 Unspecified asthma, uncomplicated: Secondary | ICD-10-CM | POA: Diagnosis not present

## 2015-01-31 DIAGNOSIS — Z9049 Acquired absence of other specified parts of digestive tract: Secondary | ICD-10-CM | POA: Diagnosis not present

## 2015-01-31 DIAGNOSIS — Z87891 Personal history of nicotine dependence: Secondary | ICD-10-CM | POA: Insufficient documentation

## 2015-01-31 DIAGNOSIS — Z8679 Personal history of other diseases of the circulatory system: Secondary | ICD-10-CM | POA: Diagnosis not present

## 2015-01-31 DIAGNOSIS — Z8619 Personal history of other infectious and parasitic diseases: Secondary | ICD-10-CM | POA: Diagnosis not present

## 2015-01-31 DIAGNOSIS — Z3202 Encounter for pregnancy test, result negative: Secondary | ICD-10-CM | POA: Diagnosis not present

## 2015-01-31 DIAGNOSIS — R1011 Right upper quadrant pain: Secondary | ICD-10-CM | POA: Diagnosis not present

## 2015-01-31 LAB — URINALYSIS, ROUTINE W REFLEX MICROSCOPIC
BILIRUBIN URINE: NEGATIVE
Glucose, UA: NEGATIVE mg/dL
HGB URINE DIPSTICK: NEGATIVE
Ketones, ur: NEGATIVE mg/dL
NITRITE: NEGATIVE
PH: 5.5 (ref 5.0–8.0)
PROTEIN: NEGATIVE mg/dL
Specific Gravity, Urine: 1.023 (ref 1.005–1.030)
Urobilinogen, UA: 0.2 mg/dL (ref 0.0–1.0)

## 2015-01-31 LAB — URINE MICROSCOPIC-ADD ON

## 2015-01-31 LAB — COMPREHENSIVE METABOLIC PANEL
ALT: 12 U/L — AB (ref 14–54)
ANION GAP: 7 (ref 5–15)
AST: 16 U/L (ref 15–41)
Albumin: 4.1 g/dL (ref 3.5–5.0)
Alkaline Phosphatase: 88 U/L (ref 38–126)
BUN: 13 mg/dL (ref 6–20)
CALCIUM: 9 mg/dL (ref 8.9–10.3)
CO2: 24 mmol/L (ref 22–32)
CREATININE: 0.68 mg/dL (ref 0.44–1.00)
Chloride: 106 mmol/L (ref 101–111)
GFR calc Af Amer: >60 mL/min (ref >60)
GFR calc non Af Amer: >60 mL/min (ref >60)
Glucose, Bld: 87 mg/dL (ref 65–99)
Potassium: 3.9 mmol/L (ref 3.5–5.1)
SODIUM: 137 mmol/L (ref 135–145)
TOTAL PROTEIN: 7.9 g/dL (ref 6.5–8.1)
Total Bilirubin: 0.6 mg/dL (ref 0.3–1.2)

## 2015-01-31 LAB — CBC WITH DIFFERENTIAL/PLATELET
Basophils Absolute: 0 10*3/uL (ref 0.0–0.1)
Basophils Relative: 0 % (ref 0–1)
EOS PCT: 6 % — AB (ref 0–5)
Eosinophils Absolute: 0.4 10*3/uL (ref 0.0–0.7)
HCT: 36.9 % (ref 36.0–46.0)
HEMOGLOBIN: 12.4 g/dL (ref 12.0–15.0)
Lymphocytes Relative: 36 % (ref 12–46)
Lymphs Abs: 2.4 10*3/uL (ref 0.7–4.0)
MCH: 29.3 pg (ref 26.0–34.0)
MCHC: 33.6 g/dL (ref 30.0–36.0)
MCV: 87.2 fL (ref 78.0–100.0)
MONO ABS: 0.3 10*3/uL (ref 0.1–1.0)
MONOS PCT: 5 % (ref 3–12)
NEUTROS ABS: 3.6 10*3/uL (ref 1.7–7.7)
Neutrophils Relative %: 53 % (ref 43–77)
PLATELETS: 292 10*3/uL (ref 150–400)
RBC: 4.23 MIL/uL (ref 3.87–5.11)
RDW: 13.5 % (ref 11.5–15.5)
WBC: 6.7 10*3/uL (ref 4.0–10.5)

## 2015-01-31 LAB — I-STAT BETA HCG BLOOD, ED (MC, WL, AP ONLY): I-stat hCG, quantitative: 6 m[IU]/mL — ABNORMAL HIGH (ref <5)

## 2015-01-31 LAB — LIPASE, BLOOD: Lipase: 19 U/L — ABNORMAL LOW (ref 22–51)

## 2015-01-31 MED ORDER — HYDROMORPHONE HCL 1 MG/ML IJ SOLN
1.0000 mg | Freq: Once | INTRAMUSCULAR | Status: AC
  Administered 2015-01-31: 1 mg via INTRAVENOUS
  Filled 2015-01-31: qty 1

## 2015-01-31 MED ORDER — HYDROMORPHONE HCL 2 MG PO TABS: 2.0000 mg | ORAL_TABLET | ORAL | Status: DC | PRN

## 2015-01-31 MED ORDER — IOHEXOL 350 MG/ML SOLN
100.0000 mL | Freq: Once | INTRAVENOUS | Status: AC | PRN
Start: 2015-01-31 — End: 2015-01-31
  Administered 2015-01-31: 100 mL via INTRAVENOUS

## 2015-01-31 MED ORDER — IOHEXOL 300 MG/ML  SOLN
100.0000 mL | Freq: Once | INTRAMUSCULAR | Status: AC | PRN
  Administered 2015-01-31: 100 mL via INTRAVENOUS

## 2015-01-31 MED ORDER — SODIUM CHLORIDE 0.9 % IV BOLUS (SEPSIS)
1000.0000 mL | Freq: Once | INTRAVENOUS | Status: AC
  Administered 2015-01-31: 1000 mL via INTRAVENOUS

## 2015-01-31 MED ORDER — IOHEXOL 300 MG/ML  SOLN
50.0000 mL | Freq: Once | INTRAMUSCULAR | Status: AC | PRN
  Administered 2015-01-31: 50 mL via ORAL

## 2015-01-31 MED ORDER — ONDANSETRON HCL 4 MG/2ML IJ SOLN
4.0000 mg | Freq: Once | INTRAMUSCULAR | Status: AC
  Administered 2015-01-31: 4 mg via INTRAVENOUS
  Filled 2015-01-31: qty 2

## 2015-01-31 NOTE — ED Notes (Signed)
Pt requesting something for pain. EDP just in room with pt. Pt informed medications will be administered when ordered. Given warm compress per request.

## 2015-01-31 NOTE — Discharge Instructions (Signed)
Try using heat on the sore area 3 or 4 times a day. Return here, if needed, for problems.   Abdominal Pain Many things can cause abdominal pain. Usually, abdominal pain is not caused by a disease and will improve without treatment. It can often be observed and treated at home. Your health care provider will do a physical exam and possibly order blood tests and X-rays to help determine the seriousness of your pain. However, in many cases, more time must pass before a clear cause of the pain can be found. Before that point, your health care provider may not know if you need more testing or further treatment. HOME CARE INSTRUCTIONS  Monitor your abdominal pain for any changes. The following actions may help to alleviate any discomfort you are experiencing:  Only take over-the-counter or prescription medicines as directed by your health care provider.  Do not take laxatives unless directed to do so by your health care provider.  Try a clear liquid diet (broth, tea, or water) as directed by your health care provider. Slowly move to a bland diet as tolerated. SEEK MEDICAL CARE IF:  You have unexplained abdominal pain.  You have abdominal pain associated with nausea or diarrhea.  You have pain when you urinate or have a bowel movement.  You experience abdominal pain that wakes you in the night.  You have abdominal pain that is worsened or improved by eating food.  You have abdominal pain that is worsened with eating fatty foods.  You have a fever. SEEK IMMEDIATE MEDICAL CARE IF:   Your pain does not go away within 2 hours.  You keep throwing up (vomiting).  Your pain is felt only in portions of the abdomen, such as the right side or the left lower portion of the abdomen.  You pass bloody or black tarry stools. MAKE SURE YOU:  Understand these instructions.   Will watch your condition.   Will get help right away if you are not doing well or get worse.  Document Released:  03/26/2005 Document Revised: 06/21/2013 Document Reviewed: 02/23/2013 [facility] Patient Information 2015 [place], [place]. This information is not intended to replace advice given to you by your health care provider. Make sure you discuss any questions you have with your health care provider.

## 2015-01-31 NOTE — ED Provider Notes (Addendum)
CSN: [id number]     Arrival date & time 01/31/15  1250 History   First MD Initiated Contact with Patient 01/31/15 1325     No chief complaint on file.    (Consider location/radiation/quality/duration/timing/severity/associated sxs/prior Treatment) HPI  24 year old female who presents with right upper quadrant abdominal pain. She is postpartum from normal spontaneous vaginal delivery about one month ago, and after her delivery and complicating right upper quadrant tenderness that was consistent with cholelithiasis with persistent biliary colic. She underwent lap cholecystectomy on 01/17/2015, that was uncomplicated. After her surgery she developed. Incisional pain, which she said was improving. 5 days ago, she developed a different right upper quadrant and right lower quadrant abdominal pain that has been worsening. It is worse with heavy lifting including even lifting her baby. It is associated with pleuritic pain in her right upper quadrant that also radiates to her right shoulder. She has felt nauseous but has not had vomiting. Denies diarrhea, melena, hematochezia, or urinary complaints. She has not had any fever, chills, night sweats, cough, or other URI symptoms. She was scheduled for an outpatient CT abdomen pelvis by her surgeon, but given severity of her pain was sent to our ED for evaluation. She has noted mild shortness of breath that is intermittent associated with the pain but denies any lower extremity edema, or PND. Symptoms are not related to exertion. She has not had lower extremity swelling or pain.   Past Medical History  Diagnosis Date  . Asthma   . Herpes simplex of female genitalia     last outbreak 4 months ago  . Migraines   . Headache in pregnancy   . Migraine    Past Surgical History  Procedure Laterality Date  . Colposcopy  03/2014  . Cholecystectomy N/A 01/17/2015    Procedure: LAPAROSCOPIC CHOLECYSTECTOMY WITH INTRAOPERATIVE CHOLANGIOGRAM;  Surgeon: Excell Seltzer, MD;  Location: [facility];  Service: General;  Laterality: N/A;   Family History  Problem Relation Age of Onset  . Asthma Mother   . Asthma Sister   . Asthma Sister    History  Substance Use Topics  . Smoking status: Former Smoker -- 0.25 packs/day for 2 years    Types: Cigarettes    Quit date: 05/02/2013  . Smokeless tobacco: Never Used  . Alcohol Use: No     Comment: socially   OB History    Gravida Para Term Preterm AB TAB SAB Ectopic Multiple Living   3 3 3  0 0 0 0 0 0 3     Review of Systems  10/14 systems reviewed and are negative other than those stated in the HPI  Allergies  Shellfish allergy  Home Medications   Prior to Admission medications   Medication Sig Start Date End Date Taking? Authorizing Provider  albuterol (PROVENTIL HFA;VENTOLIN HFA) 108 (90 BASE) MCG/ACT inhaler Inhale 2 puffs into the lungs every 6 (six) hours as needed for wheezing or shortness of breath.   Yes Historical Provider, MD  ibuprofen (ADVIL,MOTRIN) 600 MG tablet Take 1 tablet (600 mg total) by mouth every 6 (six) hours as needed. Patient taking differently: Take 600 mg by mouth every 6 (six) hours as needed for moderate pain.  01/05/15  Yes Vanessa Kick, MD  ondansetron (ZOFRAN) 4 MG tablet Take 1 tablet (4 mg total) by mouth every 6 (six) hours. Patient taking differently: Take 4 mg by mouth every 6 (six) hours as needed for nausea or vomiting.  01/14/15  Yes Luvenia Redden, PA-C  oxyCODONE-acetaminophen (PERCOCET/ROXICET) 5-325 MG per tablet Take 1-2 tablets by mouth every 4 (four) hours as needed (pain). 01/18/15  Yes Earnstine Regal, PA-C  Prenatal Vit-Fe Fumarate-FA (PRENATAL COMPLETE) 14-0.4 MG TABS Take 1 tablet by mouth daily. Patient taking differently: Take 2 tablets by mouth daily.  05/31/14  Yes Lezlie Lye, NP  docusate sodium (COLACE) 100 MG capsule Take 1 capsule (100 mg total) by mouth 2 (two) times daily. Patient not taking: Reported on 01/14/2015 01/05/15   Vanessa Kick,  MD  oxyCODONE-acetaminophen (PERCOCET/ROXICET) 5-325 MG per tablet Take 1-2 tablets by mouth every 6 (six) hours as needed for severe pain. Patient not taking: Reported on 01/31/2015 01/14/15   Luvenia Redden, PA-C   BP 134/80 mmHg  Pulse 73  Temp(Src) 97.5 F (36.4 C) (Oral)  Resp 14  SpO2 100%  LMP 01/17/2015 Physical Exam  Nursing note and vitals reviewed. Physical Exam  Constitutional: Well developed, well nourished, non-toxic, and in no acute distress Head: Normocephalic and atraumatic.  Mouth/Throat: Oropharynx is clear and moist.  Neck: Normal range of motion. Neck supple.  Cardiovascular: Normal rate and regular rhythm.   Pulmonary/Chest: Effort normal and breath sounds normal. No chest wall tenderness. Abdominal: Soft. Non-distended. Post-surgical scars w/d/i. No exudates or surrounding skin changes in the RUQ. There is tenderness to palpation in RUQ, with positive murphy's sign. There is no rebound and no guarding. Minimal tenderness at McBurney's point. No Rovsing's or psoas sign.  Musculoskeletal: Normal range of motion.  Neurological: Alert, no facial droop, fluent speech, moves all extremities symmetrically Skin: Skin is warm and dry.  Psychiatric: Cooperative   ED Course  Procedures (including critical care time) Labs Review Labs Reviewed  CBC WITH DIFFERENTIAL/PLATELET - Abnormal; Notable for the following:    Eosinophils Relative 6 (*)    All other components within normal limits  COMPREHENSIVE METABOLIC PANEL - Abnormal; Notable for the following:    ALT 12 (*)    All other components within normal limits  LIPASE, BLOOD - Abnormal; Notable for the following:    Lipase 19 (*)    All other components within normal limits  URINALYSIS, ROUTINE W REFLEX MICROSCOPIC (NOT AT [facility]) - Abnormal; Notable for the following:    Leukocytes, UA TRACE (*)    All other components within normal limits  URINE MICROSCOPIC-ADD ON - Abnormal; Notable for the following:     Squamous Epithelial / LPF FEW (*)    All other components within normal limits  I-STAT BETA HCG BLOOD, ED ([facility], [facility], [facility] ONLY) - Abnormal; Notable for the following:    I-stat hCG, quantitative 6.0 (*)    All other components within normal limits    Imaging Review Ct Angio Chest Pe W/cm &/or Wo Cm  01/31/2015   CLINICAL DATA:  Right-sided pleuritic chest pain. Postop from cholecystectomy approximately 10 days ago.  EXAM: CT ANGIOGRAPHY CHEST WITH CONTRAST  TECHNIQUE: Multidetector CT imaging of the chest was performed using the standard protocol during bolus administration of intravenous contrast. Multiplanar CT image reconstructions and MIPs were obtained to evaluate the vascular anatomy.  CONTRAST:  128mL OMNIPAQUE IOHEXOL 350 MG/ML SOLN  COMPARISON:  None.  FINDINGS: Mediastinum/Lymph Nodes: Satisfactory opacification of pulmonary arteries is noted, and no pulmonary embolism identified. No evidence of thoracic aortic dissection or aneurysm. No masses or pathologically enlarged lymph nodes identified.  Lungs/Pleura: No pulmonary infiltrate or mass identified. Mild atelectasis noted in the dependent lung bases. No effusion or pneumothorax present.  Musculoskeletal/Soft Tissues: No  suspicious bone lesions or other significant chest wall abnormality.  Upper Abdomen:  Unremarkable.  Review of the MIP images confirms the above findings.  IMPRESSION: No evidence of pulmonary embolism.  Mild bibasilar atelectasis.   Electronically Signed   By: Earle Gell M.D.   On: 01/31/2015 18:49   Ct Abdomen Pelvis W Contrast  01/31/2015   CLINICAL DATA:  Cholecystectomy 01/17/2015. Right upper quadrant pain.  EXAM: CT ABDOMEN AND PELVIS WITH CONTRAST  TECHNIQUE: Multidetector CT imaging of the abdomen and pelvis was performed using the standard protocol following bolus administration of intravenous contrast.  CONTRAST:  150mL OMNIPAQUE IOHEXOL 300 MG/ML  SOLN  COMPARISON:  Ultrasound abdomen 01/14/2015  FINDINGS: Lower chest:    lung bases clear.  Hepatobiliary: Postop cholecystectomy. No fluid collection in the gallbladder bed. Bile ducts nondilated. Liver homogeneous without focal mass or fluid collection.  Pancreas: Negative  Spleen: Spleen normal in size. Just above the spleen there is a soft tissue rounded density measuring 13 mm in diameter with similar density the spleen most likely an accessory spleen.  Adrenals/Urinary Tract: Normal kidneys bilaterally without mass or obstruction or stone. Urinary bladder normal.  Stomach/Bowel: Negative for bowel obstruction. Moderate stool in the colon. Normal appendix. No bowel edema.  Vascular/Lymphatic: Negative  Reproductive: Normal sized uterus. No mass in the adnexa. No free fluid.  Other: Negative for ascites or adenopathy.  Musculoskeletal: Negative  IMPRESSION: Postop cholecystectomy without complication. No ductal dilatation or fluid collection.  13 mm soft tissue nodule anterior and above the spleen likely an accessory spleen.  No acute abnormality.   Electronically Signed   By: Franchot Gallo M.D.   On: 01/31/2015 15:26     EKG Interpretation   Date/Time:  Wednesday January 31 2015 14:04:41 EDT Ventricular Rate:  57 PR Interval:  139 QRS Duration: 95 QT Interval:  453 QTC Calculation: 441 R Axis:   32 Text Interpretation:  Sinus rhythm No significant change since last  tracing Confirmed by LIU MD, Hinton Dyer [phone]) on 01/31/2015 5:30:59 PM      MDM   Final diagnoses:  None    In short, this is a 24 year old female 1 month post-partum and 2 weeks post op from lap cholecystectomy who presents with RUQ abdominal pain with radiation to the left shoulder. She is non-toxic and in no acute distress, but appears uncomfortable secondary to pain. AFVSS. SHe has a soft and non-peritoneal abdomen but with tenderness to palpation and mild TTP in RLQ. Concern for possible bile leak vs abscess vs appendicitis; although, no major infectious symptoms to suggest intraabdominal  infection. C/f PE given recent pregnancy and surgery with pleuritic nature of pain in the right low chest vs RUQ and referred shoulder pain. CT abd/pelvis initially performed, visualized, and shows no acute intraabdominal processes. Subsequently CT chest performed and pending regarding evaluation for PE. CBC, CMP, lipase, and UA overall unremarkable. If CT chest not concerning for PE, patient will be appropriate for discharge.    Forde Dandy, MD 01/31/15 1918  Forde Dandy, MD 01/31/15 [phone]

## 2015-01-31 NOTE — Telephone Encounter (Signed)
pt called this am. stating she was having worse Rt sided pain. also difficulty breathing in but not exhaling. took percocet with no relief in rt sided abd pain. pt was seen in Urgent office yesterday and labs ordered (not done yet) and ct ordered for today. at this point, since po pain med not controlling pain, advised pt to go ED. pt voiced understanding.

## 2015-01-31 NOTE — ED Notes (Signed)
Questions r/t dc were denied. Pt is ambulatory and a&ox4 

## 2015-01-31 NOTE — ED Notes (Signed)
Patient transported to CT 

## 2015-01-31 NOTE — ED Provider Notes (Signed)
20:00- patient seen in follow-up after imaging to reassess. She continues to have right upper quadrant tenderness which is moderate. She has received multiple doses of IV Dilaudid. She also has mild lateral right chest wall tenderness. Vital signs are reviewed. I discussed the case with Dr. Ninfa Linden, surgeon, who saw her yesterday in the office. He agrees that there is no evident acute process, or postoperative complication that is evident on testing that has been done today. The patient is stable for discharge with outpatient follow-up and evaluation. She states that she has an appointment with her obstetrician, this week for a follow-up, from her recent delivery.  Plan- change Oxycodone, to hydromorphone. His heat on sore area and follow-up with OB, this week. Return here, when necessary.  Medications  ondansetron (ZOFRAN) injection 4 mg (4 mg Intravenous Given 01/31/15 1405)  HYDROmorphone (DILAUDID) injection 1 mg (1 mg Intravenous Given 01/31/15 1405)  iohexol (OMNIPAQUE) 300 MG/ML solution 50 mL (50 mLs Oral Contrast Given 01/31/15 1410)  iohexol (OMNIPAQUE) 300 MG/ML solution 100 mL (100 mLs Intravenous Contrast Given 01/31/15 1503)  HYDROmorphone (DILAUDID) injection 1 mg (1 mg Intravenous Given 01/31/15 1553)  sodium chloride 0.9 % bolus 1,000 mL (0 mLs Intravenous Stopped 01/31/15 1757)  HYDROmorphone (DILAUDID) injection 1 mg (1 mg Intravenous Given 01/31/15 1802)  iohexol (OMNIPAQUE) 350 MG/ML injection 100 mL (100 mLs Intravenous Contrast Given 01/31/15 1823)    Patient Vitals for the past 24 hrs:  BP Temp Temp src Pulse Resp SpO2  01/31/15 1927 108/59 mmHg - - (!) 57 13 98 %  01/31/15 1719 134/80 mmHg 97.5 F (36.4 C) Oral 73 14 100 %  01/31/15 1309 136/78 mmHg 97.9 F (36.6 C) - 85 18 99 %    Ct Angio Chest Pe W/cm &/or Wo Cm  01/31/2015   CLINICAL DATA:  Right-sided pleuritic chest pain. Postop from cholecystectomy approximately 10 days ago.  EXAM: CT ANGIOGRAPHY CHEST WITH CONTRAST   TECHNIQUE: Multidetector CT imaging of the chest was performed using the standard protocol during bolus administration of intravenous contrast. Multiplanar CT image reconstructions and MIPs were obtained to evaluate the vascular anatomy.  CONTRAST:  19mL OMNIPAQUE IOHEXOL 350 MG/ML SOLN  COMPARISON:  None.  FINDINGS: Mediastinum/Lymph Nodes: Satisfactory opacification of pulmonary arteries is noted, and no pulmonary embolism identified. No evidence of thoracic aortic dissection or aneurysm. No masses or pathologically enlarged lymph nodes identified.  Lungs/Pleura: No pulmonary infiltrate or mass identified. Mild atelectasis noted in the dependent lung bases. No effusion or pneumothorax present.  Musculoskeletal/Soft Tissues: No suspicious bone lesions or other significant chest wall abnormality.  Upper Abdomen:  Unremarkable.  Review of the MIP images confirms the above findings.  IMPRESSION: No evidence of pulmonary embolism.  Mild bibasilar atelectasis.   Electronically Signed   By: Earle Gell M.D.   On: 01/31/2015 18:49   Ct Abdomen Pelvis W Contrast  01/31/2015   CLINICAL DATA:  Cholecystectomy 01/17/2015. Right upper quadrant pain.  EXAM: CT ABDOMEN AND PELVIS WITH CONTRAST  TECHNIQUE: Multidetector CT imaging of the abdomen and pelvis was performed using the standard protocol following bolus administration of intravenous contrast.  CONTRAST:  144mL OMNIPAQUE IOHEXOL 300 MG/ML  SOLN  COMPARISON:  Ultrasound abdomen 01/14/2015  FINDINGS: Lower chest:   lung bases clear.  Hepatobiliary: Postop cholecystectomy. No fluid collection in the gallbladder bed. Bile ducts nondilated. Liver homogeneous without focal mass or fluid collection.  Pancreas: Negative  Spleen: Spleen normal in size. Just above the spleen there is a  soft tissue rounded density measuring 13 mm in diameter with similar density the spleen most likely an accessory spleen.  Adrenals/Urinary Tract: Normal kidneys bilaterally without mass or  obstruction or stone. Urinary bladder normal.  Stomach/Bowel: Negative for bowel obstruction. Moderate stool in the colon. Normal appendix. No bowel edema.  Vascular/Lymphatic: Negative  Reproductive: Normal sized uterus. No mass in the adnexa. No free fluid.  Other: Negative for ascites or adenopathy.  Musculoskeletal: Negative  IMPRESSION: Postop cholecystectomy without complication. No ductal dilatation or fluid collection.  13 mm soft tissue nodule anterior and above the spleen likely an accessory spleen.  No acute abnormality.   Electronically Signed   By: Franchot Gallo M.D.   On: 01/31/2015 15:26   Results for orders placed or performed during the hospital encounter of 01/31/15  CBC with Differential  Result Value Ref Range   WBC 6.7 4.0 - 10.5 K/uL   RBC 4.23 3.87 - 5.11 MIL/uL   Hemoglobin 12.4 12.0 - 15.0 g/dL   HCT 36.9 36.0 - 46.0 %   MCV 87.2 78.0 - 100.0 fL   MCH 29.3 26.0 - 34.0 pg   MCHC 33.6 30.0 - 36.0 g/dL   RDW 13.5 11.5 - 15.5 %   Platelets 292 150 - 400 K/uL   Neutrophils Relative % 53 43 - 77 %   Neutro Abs 3.6 1.7 - 7.7 K/uL   Lymphocytes Relative 36 12 - 46 %   Lymphs Abs 2.4 0.7 - 4.0 K/uL   Monocytes Relative 5 3 - 12 %   Monocytes Absolute 0.3 0.1 - 1.0 K/uL   Eosinophils Relative 6 (H) 0 - 5 %   Eosinophils Absolute 0.4 0.0 - 0.7 K/uL   Basophils Relative 0 0 - 1 %   Basophils Absolute 0.0 0.0 - 0.1 K/uL  Comprehensive metabolic panel  Result Value Ref Range   Sodium 137 135 - 145 mmol/L   Potassium 3.9 3.5 - 5.1 mmol/L   Chloride 106 101 - 111 mmol/L   CO2 24 22 - 32 mmol/L   Glucose, Bld 87 65 - 99 mg/dL   BUN 13 6 - 20 mg/dL   Creatinine, Ser 0.68 0.44 - 1.00 mg/dL   Calcium 9.0 8.9 - 10.3 mg/dL   Total Protein 7.9 6.5 - 8.1 g/dL   Albumin 4.1 3.5 - 5.0 g/dL   AST 16 15 - 41 U/L   ALT 12 (L) 14 - 54 U/L   Alkaline Phosphatase 88 38 - 126 U/L   Total Bilirubin 0.6 0.3 - 1.2 mg/dL   GFR calc non Af Amer >60 >60 mL/min   GFR calc Af Amer >60 >60  mL/min   Anion gap 7 5 - 15  Lipase, blood  Result Value Ref Range   Lipase 19 (L) 22 - 51 U/L  Urinalysis, Routine w reflex microscopic (not at [facility])  Result Value Ref Range   Color, Urine YELLOW YELLOW   APPearance CLEAR CLEAR   Specific Gravity, Urine 1.023 1.005 - 1.030   pH 5.5 5.0 - 8.0   Glucose, UA NEGATIVE NEGATIVE mg/dL   Hgb urine dipstick NEGATIVE NEGATIVE   Bilirubin Urine NEGATIVE NEGATIVE   Ketones, ur NEGATIVE NEGATIVE mg/dL   Protein, ur NEGATIVE NEGATIVE mg/dL   Urobilinogen, UA 0.2 0.0 - 1.0 mg/dL   Nitrite NEGATIVE NEGATIVE   Leukocytes, UA TRACE (A) NEGATIVE  Urine microscopic-add on  Result Value Ref Range   Squamous Epithelial / LPF FEW (A) RARE   WBC,  UA 0-2 <3 WBC/hpf  I-Stat Beta hCG blood, ED ([facility], [facility], [facility] only)  Result Value Ref Range   I-stat hCG, quantitative 6.0 (H) <5 mIU/mL   Comment 3            Daleen Bo, MD 01/31/15 2008

## 2015-01-31 NOTE — Progress Notes (Signed)
Pt confirmed with ED Cm she does not have a [place] pcp at this time Reports receiving the list of medicaid providers but not chosen one yet  Cm offered another list for pt to chose a provider and encouraged her to chose one prior to DSS assigning one for her Encouraged her to let DS know of her medicaid dr choice prior to going to medicaid pcp

## 2015-01-31 NOTE — ED Notes (Addendum)
Pt complains of pain from her RLQ radiating to her right shoulder. Pt had her gallbladder removed on 7/22. Pt was scheduled to have CT and labs performed today at 1230PM but states she was in too much pain to go. Pt called her doctor, who reccommended she come to ED for pian management/evaluation. Pt states pain is worse with inhalation. Pt's laproscopic surgical sites appear clean and dry.

## 2015-03-02 ENCOUNTER — Other Ambulatory Visit: Payer: Self-pay | Admitting: Surgery

## 2015-03-02 DIAGNOSIS — G8918 Other acute postprocedural pain: Secondary | ICD-10-CM

## 2015-03-12 ENCOUNTER — Other Ambulatory Visit: Payer: Medicaid Other

## 2015-04-19 ENCOUNTER — Encounter (HOSPITAL_COMMUNITY): Payer: Self-pay | Admitting: Emergency Medicine

## 2015-04-19 ENCOUNTER — Emergency Department (HOSPITAL_COMMUNITY)
Admission: EM | Admit: 2015-04-19 | Discharge: 2015-04-19 | Disposition: A | Discharge status: Home / Self Care | Payer: Medicaid Other | Attending: Physician Assistant | Admitting: Physician Assistant

## 2015-04-19 DIAGNOSIS — Z87891 Personal history of nicotine dependence: Secondary | ICD-10-CM | POA: Insufficient documentation

## 2015-04-19 DIAGNOSIS — Z880 Allergy status to penicillin: Secondary | ICD-10-CM | POA: Diagnosis not present

## 2015-04-19 DIAGNOSIS — J45909 Unspecified asthma, uncomplicated: Secondary | ICD-10-CM | POA: Diagnosis not present

## 2015-04-19 DIAGNOSIS — K0889 Other specified disorders of teeth and supporting structures: Secondary | ICD-10-CM | POA: Diagnosis present

## 2015-04-19 DIAGNOSIS — Z8679 Personal history of other diseases of the circulatory system: Secondary | ICD-10-CM | POA: Insufficient documentation

## 2015-04-19 DIAGNOSIS — Z8619 Personal history of other infectious and parasitic diseases: Secondary | ICD-10-CM | POA: Diagnosis not present

## 2015-04-19 NOTE — Discharge Instructions (Signed)
Please read attached information. If you experience any new or worsening signs or symptoms please return to the emergency room for evaluation. Please follow-up with your primary care provider or specialist as discussed. Please use medication prescribed only as directed and discontinue taking if you have any concerning signs or symptoms.   °

## 2015-04-19 NOTE — ED Provider Notes (Signed)
CSN: [id number]     Arrival date & time 04/19/15  1548 History  By signing my name below, I, Irene Pap, attest that this documentation has been prepared under the direction and in the presence of [employer], PA-C. Electronically Signed: Irene Pap, ED Scribe. 04/19/2015. 4:43 PM.  Chief Complaint  Patient presents with  . Dental Pain   The history is provided by the patient. No language interpreter was used.   HPI Comments: Katrina [name] is a 24 y.o. female who presents to the Emergency Department complaining of dental pain onset one week ago. Pt reports that she had a tooth pulled this morning by Dr. Aleda Grana. She has not been taking her medication since her first dost because they caused her to break out with a rash all over her body. She states that she was prescribed Tylenol 3, clindamycin, and penicillin, which were filled at [facility]. She states that her tooth pain first started two days ago and has not been able to eat since yesterday. She reports additionally taking ibuprofen to no relief. She denies previous allergies to either medication. She reports calling her dentist about the rash she experienced and was told to take benadryl. Patient denies difficulty breathing, swelling of the throat.  Per the narcotics database, pt was prescribed 30 Oxycodone on 01/05/15, 20 on 01/15/15, 30 on 01/19/15, 30 on 01/24/15, 30 on 01/30/15, 30 hydromorphone on 01/31/15.    Past Medical History  Diagnosis Date  . Asthma   . Herpes simplex of female genitalia     last outbreak 4 months ago  . Migraines   . Headache in pregnancy   . Migraine    Past Surgical History  Procedure Laterality Date  . Colposcopy  03/2014  . Cholecystectomy N/A 01/17/2015    Procedure: LAPAROSCOPIC CHOLECYSTECTOMY WITH INTRAOPERATIVE CHOLANGIOGRAM;  Surgeon: Excell Seltzer, MD;  Location: [facility];  Service: General;  Laterality: N/A;   Family History  Problem Relation Age of Onset  . Asthma Mother   . Asthma Sister    . Asthma Sister    Social History  Substance Use Topics  . Smoking status: Former Smoker -- 0.25 packs/day for 2 years    Types: Cigarettes    Quit date: 05/02/2013  . Smokeless tobacco: Never Used  . Alcohol Use: No     Comment: socially   OB History    Gravida Para Term Preterm AB TAB SAB Ectopic Multiple Living   3 3 3  0 0 0 0 0 0 3     Review of Systems  All other systems reviewed and are negative.  Allergies  Shellfish allergy; Other; and Penicillins  Home Medications   Prior to Admission medications   Medication Sig Start Date End Date Taking? Authorizing Provider  albuterol (PROVENTIL HFA;VENTOLIN HFA) 108 (90 BASE) MCG/ACT inhaler Inhale 2 puffs into the lungs every 6 (six) hours as needed for wheezing or shortness of breath.   Yes Historical Provider, MD  HYDROCODONE BITARTRATE PO Take 1 tablet by mouth daily as needed. Patient does not remember dose. A friend gave her this medication per patient   Yes Historical Provider, MD  ibuprofen (ADVIL,MOTRIN) 200 MG tablet Take 200 mg by mouth every 6 (six) hours as needed for moderate pain.    Yes Historical Provider, MD  docusate sodium (COLACE) 100 MG capsule Take 1 capsule (100 mg total) by mouth 2 (two) times daily. Patient not taking: Reported on 01/14/2015 01/05/15   Vanessa Kick, MD  HYDROmorphone (DILAUDID)  2 MG tablet Take 1 tablet (2 mg total) by mouth every 4 (four) hours as needed for severe pain (pain). Patient not taking: Reported on 04/19/2015 01/31/15   Daleen Bo, MD  ibuprofen (ADVIL,MOTRIN) 600 MG tablet Take 1 tablet (600 mg total) by mouth every 6 (six) hours as needed. Patient not taking: Reported on 04/19/2015 01/05/15   Vanessa Kick, MD  ondansetron (ZOFRAN) 4 MG tablet Take 1 tablet (4 mg total) by mouth every 6 (six) hours. Patient not taking: Reported on 04/19/2015 01/14/15   Luvenia Redden, PA-C  oxyCODONE-acetaminophen (PERCOCET/ROXICET) 5-325 MG per tablet Take 1-2 tablets by mouth every 6 (six) hours  as needed for severe pain. Patient not taking: Reported on 01/31/2015 01/14/15   Luvenia Redden, PA-C  Prenatal Vit-Fe Fumarate-FA (PRENATAL COMPLETE) 14-0.4 MG TABS Take 1 tablet by mouth daily. Patient not taking: Reported on 04/19/2015 05/31/14   Lezlie Lye, NP   BP 125/74 mmHg  Pulse 82  Temp(Src) 99.3 F (37.4 C) (Oral)  Resp 18  Ht 5\' 1"  (1.549 m)  SpO2 98%  LMP 04/04/2015  Physical Exam  Constitutional: She is oriented to person, place, and time. She appears well-developed and well-nourished. No distress.  HENT:  Head: Normocephalic.  Mouth/Throat: Uvula is midline, oropharynx is clear and moist and mucous membranes are normal. No oropharyngeal exudate, posterior oropharyngeal edema, posterior oropharyngeal erythema or tonsillar abscesses.  External exam shows no asymmetry of the jaw line or face, no signs of obvious swelling, edema, infection. Full active range of motion of the jaw. Neck is supple with full active range of motion, no tenderness to palpation of the soft tissues  Gumline palpated no obvious signs of infection including warmth, redness, abscess, tenderness. Posterior oropharynx clear with no signs of infection, uvula is midline and rises with phonation, tonsils present and normal in size, symmetrical bilateral, tongue is normal soft touch with full active range of motion, floor mouth is soft nontender.  Upper left second bicuspid removed; remaining dentition appears to be within normal limits; no active bleeding; no surrounding erythema, swelling or edema  Eyes: Conjunctivae are normal. Pupils are equal, round, and reactive to light. Right eye exhibits no discharge. Left eye exhibits no discharge.  Neck: Normal range of motion. Neck supple. No JVD present. No tracheal deviation present. No thyromegaly present.  Pulmonary/Chest: Effort normal and breath sounds normal. No stridor. No respiratory distress. She has no wheezes. She has no rales. She exhibits no  tenderness.  Lymphadenopathy:    She has no cervical adenopathy.  Neurological: She is alert and oriented to person, place, and time.  Skin: Skin is warm and dry. No rash noted. She is not diaphoretic. No erythema. No pallor.  No rash, erythema, swelling or edema  Psychiatric: She has a normal mood and affect. Her behavior is normal. Judgment and thought content normal.  Nursing note and vitals reviewed.   ED Course  Procedures (including critical care time) DIAGNOSTIC STUDIES: Oxygen Saturation is 98% on RA, normal by my interpretation.    COORDINATION OF CARE: 4:36 PM-Discussed treatment plan which includes continuing benadryl and clindamycin with pt at bedside and pt agreed to plan.   Labs Review Labs Reviewed - No data to display  Imaging Review No results found. I have personally reviewed and evaluated these images and lab results as part of my medical decision-making.   EKG Interpretation None      MDM   Final diagnoses:  Pain, dental  Labs:  Imaging:   Consults:  Therapeutics:   Discharge Meds:   Assessment/Plan:  24 year old female presents today with likely drug allergy. Patient had tooth pulled this morning took both her penicillin and Tylenol 3 had diffuse rash throughout her body, no signs of anaphylactic type reaction. Symptoms resolved with oral Benadryl, no symptoms at the time my evaluation. Patient is instructed to take the clindamycin that was prescribed by her dentist, monitor for new or worsening signs or symptoms and return immediately if any present. Patient verbalized understanding and agreement for today's plan and had no further questions or concerns at the time of discharge     I personally performed the services described in this documentation, which was scribed in my presence. The recorded information has been reviewed and is accurate.    Okey Regal, PA-C 04/19/15 1653  Courteney Julio Alm, MD 04/24/15 [phone]

## 2015-04-19 NOTE — ED Notes (Signed)
Pt reports she had tooth pulled on the 13th. sts all the medications that they gave her were making her break out so she stopped taking them. c.o severe dental pain.

## 2015-04-19 NOTE — ED Notes (Signed)
Patient is alert and orientedx4.  Patient was explained discharge instructions and they understood them with no questions.  The patient's friend, Vevelyn Francois is taking the patient home.

## 2015-08-18 ENCOUNTER — Encounter (HOSPITAL_COMMUNITY): Payer: Self-pay

## 2015-08-18 ENCOUNTER — Inpatient Hospital Stay (HOSPITAL_COMMUNITY)
Admission: AD | Admit: 2015-08-18 | Discharge: 2015-08-18 | Disposition: A | Discharge status: Home / Self Care | Payer: Medicaid Other | Source: Ambulatory Visit | Attending: Obstetrics and Gynecology | Admitting: Obstetrics and Gynecology

## 2015-08-18 DIAGNOSIS — R102 Pelvic and perineal pain: Secondary | ICD-10-CM

## 2015-08-18 DIAGNOSIS — Z880 Allergy status to penicillin: Secondary | ICD-10-CM | POA: Diagnosis not present

## 2015-08-18 DIAGNOSIS — N946 Dysmenorrhea, unspecified: Secondary | ICD-10-CM | POA: Diagnosis not present

## 2015-08-18 DIAGNOSIS — Z87891 Personal history of nicotine dependence: Secondary | ICD-10-CM | POA: Diagnosis not present

## 2015-08-18 DIAGNOSIS — N730 Acute parametritis and pelvic cellulitis: Secondary | ICD-10-CM

## 2015-08-18 DIAGNOSIS — J45909 Unspecified asthma, uncomplicated: Secondary | ICD-10-CM | POA: Diagnosis not present

## 2015-08-18 DIAGNOSIS — N939 Abnormal uterine and vaginal bleeding, unspecified: Secondary | ICD-10-CM | POA: Diagnosis present

## 2015-08-18 LAB — URINALYSIS, ROUTINE W REFLEX MICROSCOPIC
Bilirubin Urine: NEGATIVE
Glucose, UA: NEGATIVE mg/dL
Ketones, ur: NEGATIVE mg/dL
LEUKOCYTES UA: NEGATIVE
Nitrite: NEGATIVE
PROTEIN: NEGATIVE mg/dL
SPECIFIC GRAVITY, URINE: 1.025 (ref 1.005–1.030)
pH: 6 (ref 5.0–8.0)

## 2015-08-18 LAB — CBC WITH DIFFERENTIAL/PLATELET
BASOS PCT: 0 %
Basophils Absolute: 0 10*3/uL (ref 0.0–0.1)
EOS ABS: 0.3 10*3/uL (ref 0.0–0.7)
Eosinophils Relative: 5 %
HCT: 36.5 % (ref 36.0–46.0)
Hemoglobin: 12.9 g/dL (ref 12.0–15.0)
LYMPHS ABS: 2.5 10*3/uL (ref 0.7–4.0)
Lymphocytes Relative: 41 %
MCH: 30.8 pg (ref 26.0–34.0)
MCHC: 35.3 g/dL (ref 30.0–36.0)
MCV: 87.1 fL (ref 78.0–100.0)
Monocytes Absolute: 0.2 10*3/uL (ref 0.1–1.0)
Monocytes Relative: 3 %
NEUTROS ABS: 3.1 10*3/uL (ref 1.7–7.7)
NEUTROS PCT: 51 %
Platelets: 284 10*3/uL (ref 150–400)
RBC: 4.19 MIL/uL (ref 3.87–5.11)
RDW: 13 % (ref 11.5–15.5)
WBC: 6.1 10*3/uL (ref 4.0–10.5)

## 2015-08-18 LAB — URINE MICROSCOPIC-ADD ON

## 2015-08-18 LAB — WET PREP, GENITAL
CLUE CELLS WET PREP: NONE SEEN
Sperm: NONE SEEN
Trich, Wet Prep: NONE SEEN
Yeast Wet Prep HPF POC: NONE SEEN

## 2015-08-18 LAB — POCT PREGNANCY, URINE: PREG TEST UR: NEGATIVE

## 2015-08-18 MED ORDER — IBUPROFEN 600 MG PO TABS: 600.0000 mg | ORAL_TABLET | Freq: Four times a day (QID) | ORAL | Status: DC | PRN

## 2015-08-18 MED ORDER — DOXYCYCLINE HYCLATE 100 MG PO CAPS: 100.0000 mg | ORAL_CAPSULE | Freq: Two times a day (BID) | ORAL | Status: DC

## 2015-08-18 MED ORDER — KETOROLAC TROMETHAMINE 60 MG/2ML IM SOLN
60.0000 mg | Freq: Once | INTRAMUSCULAR | Status: AC
  Administered 2015-08-18: 60 mg via INTRAMUSCULAR
  Filled 2015-08-18: qty 2

## 2015-08-18 MED ORDER — AZITHROMYCIN 250 MG PO TABS
1000.0000 mg | ORAL_TABLET | Freq: Once | ORAL | Status: AC
  Administered 2015-08-18: 1000 mg via ORAL
  Filled 2015-08-18: qty 4

## 2015-08-18 MED ORDER — CEFTRIAXONE SODIUM 250 MG IJ SOLR
250.0000 mg | Freq: Once | INTRAMUSCULAR | Status: AC
  Administered 2015-08-18: 250 mg via INTRAMUSCULAR
  Filled 2015-08-18: qty 250

## 2015-08-18 NOTE — MAU Provider Note (Signed)
Chief Complaint:  Abdominal Pain and Vaginal Bleeding   First Provider Initiated Contact with Patient 08/18/15 2152     HPI  Katrina [name] is a 25 y.o. [id number] who presents to maternity admissions reporting severe pelvic pain with movement or any palpation of vagina, such as with tampon insertion.  Started yesterday with onset of menses and cramping. Got worse today.  Has never had pain like this before. She reports vaginal bleeding, no vaginal itching/burning, urinary symptoms, h/a, dizziness, n/v, or fever/chills.    Does not use contraception or condoms with partner.   RN Note: Thought i might be pregnant. Was having some nausea but upt negative. Yesterday started having lower abd pain and bleeding. Thought it was my period so tried to put in tampon and very painful. Hurts to sit or walk. No bleeding on pad but if I try to put in tampon there is blood on tampon. LMP 07/25/15.           Past Medical History: Past Medical History  Diagnosis Date  . Asthma   . Herpes simplex of female genitalia     last outbreak 4 months ago  . Migraines   . Headache in pregnancy   . Migraine     Past obstetric history: OB History  Gravida Para Term Preterm AB SAB TAB Ectopic Multiple Living  3 3 3  0 0 0 0 0 0 3    # Outcome Date GA Lbr Len/2nd Weight Sex Delivery Anes PTL Lv  3 Term 01/03/15 [redacted]w[redacted]d 08:48 / 00:06 5 lb 8.6 oz (2.512 kg) F Vag-Spont EPI  Y  2 Term 2009 [redacted]w[redacted]d  7 lb (3.175 kg)  Vag-Spont   Y  1 Term 2007 [redacted]w[redacted]d  7 lb 14 oz (3.572 kg)  Vag-Spont   Y      Past Surgical History: Past Surgical History  Procedure Laterality Date  . Colposcopy  03/2014  . Cholecystectomy N/A 01/17/2015    Procedure: LAPAROSCOPIC CHOLECYSTECTOMY WITH INTRAOPERATIVE CHOLANGIOGRAM;  Surgeon: Excell Seltzer, MD;  Location: [facility];  Service: General;  Laterality: N/A;    Family History: Family History  Problem Relation Age of Onset  . Asthma Mother   . Asthma Sister   . Asthma Sister      Social History: Social History  Substance Use Topics  . Smoking status: Former Smoker -- 0.25 packs/day for 2 years    Types: Cigarettes    Quit date: 05/02/2013  . Smokeless tobacco: Never Used  . Alcohol Use: No     Comment: socially    Allergies:  Allergies  Allergen Reactions  . Shellfish Allergy Shortness Of Breath and Swelling  . Other     Tylenol #3---Swelling   . Penicillins Swelling    Has patient had a PCN reaction causing immediate rash, facial/tongue/throat swelling, SOB or lightheadedness with hypotension: NO Has patient had a PCN reaction causing severe rash involving mucus membranes or skin necrosis:NO Has patient had a PCN reaction that required hospitalization NO Has patient had a PCN reaction occurring within the last 10 years:NO If all of the above answers are "NO", then may proceed with Cephalosporin use.      Meds:  Prescriptions prior to admission  Medication Sig Dispense Refill Last Dose  . albuterol (PROVENTIL HFA;VENTOLIN HFA) 108 (90 BASE) MCG/ACT inhaler Inhale 2 puffs into the lungs every 6 (six) hours as needed for wheezing or shortness of breath.   Past Month at Unknown time  . docusate sodium (COLACE)  100 MG capsule Take 1 capsule (100 mg total) by mouth 2 (two) times daily. (Patient not taking: Reported on 01/14/2015) 60 capsule 0 Not Taking at Unknown time  . HYDROCODONE BITARTRATE PO Take 1 tablet by mouth daily as needed. Patient does not remember dose. A friend gave her this medication per patient   04/18/2015 at Unknown time  . HYDROmorphone (DILAUDID) 2 MG tablet Take 1 tablet (2 mg total) by mouth every 4 (four) hours as needed for severe pain (pain). (Patient not taking: Reported on 04/19/2015) 30 tablet 0 Not Taking at Unknown time  . ibuprofen (ADVIL,MOTRIN) 200 MG tablet Take 200 mg by mouth every 6 (six) hours as needed for moderate pain.    04/19/2015 at Unknown time  . ibuprofen (ADVIL,MOTRIN) 600 MG tablet Take 1 tablet (600 mg  total) by mouth every 6 (six) hours as needed. (Patient not taking: Reported on 04/19/2015) 90 tablet 0 Not Taking at Unknown time  . ondansetron (ZOFRAN) 4 MG tablet Take 1 tablet (4 mg total) by mouth every 6 (six) hours. (Patient not taking: Reported on 04/19/2015) 12 tablet 0 Not Taking at Unknown time  . oxyCODONE-acetaminophen (PERCOCET/ROXICET) 5-325 MG per tablet Take 1-2 tablets by mouth every 6 (six) hours as needed for severe pain. (Patient not taking: Reported on 01/31/2015) 20 tablet 0 Not Taking at Unknown time  . Prenatal Vit-Fe Fumarate-FA (PRENATAL COMPLETE) 14-0.4 MG TABS Take 1 tablet by mouth daily. (Patient not taking: Reported on 04/19/2015) 30 each 2 Not Taking at Unknown time    I have reviewed patient's Past Medical Hx, Surgical Hx, Family Hx, Social Hx, medications and allergies.  ROS:  Review of Systems  Constitutional: Negative for fever, chills and fatigue.  Respiratory: Negative for shortness of breath.   Gastrointestinal: Positive for abdominal pain. Negative for nausea, vomiting, diarrhea and constipation.  Genitourinary: Positive for vaginal bleeding, vaginal pain, menstrual problem and pelvic pain. Negative for dysuria, flank pain and vaginal discharge.  Musculoskeletal: Negative for myalgias, back pain and neck pain.   Other systems negative   Physical Exam  Patient Vitals for the past 24 hrs:  BP Temp Pulse Resp Height Weight  08/18/15 2126 - 98.5 F (36.9 C) - - - -  08/18/15 2123 133/77 mmHg - 69 20 5\' 1"  (1.549 m) 155 lb (70.308 kg)   Constitutional: Well-developed, well-nourished female in no acute distress.  Cardiovascular: normal rate and rhythm, no ectopy audible, S1 & S2 heard, no murmur Respiratory: normal effort, no distress. Lungs CTAB with no wheezes or crackles GI: Abd soft, non-tender.  Nondistended.  No rebound, No guarding.  Bowel Sounds audible  MS: Extremities nontender, no edema, normal ROM Neurologic: Alert and oriented x 4.    Grossly nonfocal. GU: Neg CVAT. Skin:  Warm and Dry Psych:  Affect appropriate.  PELVIC EXAM: Cervix pink, visually closed, without lesion, small red discharge, c/w menses, vaginal walls and external genitalia normal Bimanual exam: Cervix firm, anterior, neg CMT, uterus is very tender, nonenlarged, adnexa has significant tenderness, with + cervical motion tenderness    Labs: Results for orders placed or performed during the hospital encounter of 08/18/15 (from the past 24 hour(s))  CBC with Differential     Status: None   Collection Time: 08/18/15 12:36 AM  Result Value Ref Range   WBC 6.1 4.0 - 10.5 K/uL   RBC 4.19 3.87 - 5.11 MIL/uL   Hemoglobin 12.9 12.0 - 15.0 g/dL   HCT 36.5 36.0 - 46.0 %  MCV 87.1 78.0 - 100.0 fL   MCH 30.8 26.0 - 34.0 pg   MCHC 35.3 30.0 - 36.0 g/dL   RDW 13.0 11.5 - 15.5 %   Platelets 284 150 - 400 K/uL   Neutrophils Relative % 51 %   Neutro Abs 3.1 1.7 - 7.7 K/uL   Lymphocytes Relative 41 %   Lymphs Abs 2.5 0.7 - 4.0 K/uL   Monocytes Relative 3 %   Monocytes Absolute 0.2 0.1 - 1.0 K/uL   Eosinophils Relative 5 %   Eosinophils Absolute 0.3 0.0 - 0.7 K/uL   Basophils Relative 0 %   Basophils Absolute 0.0 0.0 - 0.1 K/uL  Urinalysis, Routine w reflex microscopic (not at [facility])     Status: Abnormal   Collection Time: 08/18/15  9:30 PM  Result Value Ref Range   Color, Urine YELLOW YELLOW   APPearance CLEAR CLEAR   Specific Gravity, Urine 1.025 1.005 - 1.030   pH 6.0 5.0 - 8.0   Glucose, UA NEGATIVE NEGATIVE mg/dL   Hgb urine dipstick TRACE (A) NEGATIVE   Bilirubin Urine NEGATIVE NEGATIVE   Ketones, ur NEGATIVE NEGATIVE mg/dL   Protein, ur NEGATIVE NEGATIVE mg/dL   Nitrite NEGATIVE NEGATIVE   Leukocytes, UA NEGATIVE NEGATIVE  Urine microscopic-add on     Status: Abnormal   Collection Time: 08/18/15  9:30 PM  Result Value Ref Range   Squamous Epithelial / LPF 0-5 (A) NONE SEEN   WBC, UA 0-5 0 - 5 WBC/hpf   RBC / HPF 0-5 0 - 5 RBC/hpf   Bacteria,  UA FEW (A) NONE SEEN   Urine-Other MUCOUS PRESENT   Pregnancy, urine POC     Status: None   Collection Time: 08/18/15  9:43 PM  Result Value Ref Range   Preg Test, Ur NEGATIVE NEGATIVE  Wet prep, genital     Status: Abnormal   Collection Time: 08/18/15 10:17 PM  Result Value Ref Range   Yeast Wet Prep HPF POC NONE SEEN NONE SEEN   Trich, Wet Prep NONE SEEN NONE SEEN   Clue Cells Wet Prep HPF POC NONE SEEN NONE SEEN   WBC, Wet Prep HPF POC FEW (A) NONE SEEN   Sperm NONE SEEN     Imaging:  No results found.  MAU Course/MDM: I have ordered labs as follows: see above Imaging ordered: none Results reviewed.   Consult Dr Rogue Bussing with presentation, exam findings and complaints  She recommends treatment for Presumed PID.   Treatments in MAU included Rocephin and Zithromax.  Also gave a dose of Toradol with only minimal relief.   Pt stable at time of discharge.  Assessment: Pelvic pain Dysmenorrhea Presumed PID  Plan: Discharge home Recommend Pelvic rest Rx sent for Doxycycline for PID Rx sent for ibuprofen for pain Patient is to call office if not improving by early this week     Medication List    ASK your doctor about these medications        albuterol 108 (90 Base) MCG/ACT inhaler  Commonly known as:  PROVENTIL HFA;VENTOLIN HFA  Inhale 2 puffs into the lungs every 6 (six) hours as needed for wheezing or shortness of breath.     docusate sodium 100 MG capsule  Commonly known as:  COLACE  Take 1 capsule (100 mg total) by mouth 2 (two) times daily.     HYDROCODONE BITARTRATE PO  Take 1 tablet by mouth daily as needed. Patient does not remember dose. A friend gave her this  medication per patient     HYDROmorphone 2 MG tablet  Commonly known as:  DILAUDID  Take 1 tablet (2 mg total) by mouth every 4 (four) hours as needed for severe pain (pain).     ibuprofen 200 MG tablet  Commonly known as:  ADVIL,MOTRIN  Take 200 mg by mouth every 6 (six) hours as needed for  moderate pain.     ibuprofen 600 MG tablet  Commonly known as:  ADVIL,MOTRIN  Take 1 tablet (600 mg total) by mouth every 6 (six) hours as needed.     ondansetron 4 MG tablet  Commonly known as:  ZOFRAN  Take 1 tablet (4 mg total) by mouth every 6 (six) hours.     oxyCODONE-acetaminophen 5-325 MG tablet  Commonly known as:  PERCOCET/ROXICET  Take 1-2 tablets by mouth every 6 (six) hours as needed for severe pain.     PRENATAL COMPLETE 14-0.4 MG Tabs  Take 1 tablet by mouth daily.       Encouraged to return here or to other Urgent Care/ED if she develops worsening of symptoms, increase in pain, fever, or other concerning symptoms.   Hansel Feinstein CNM, MSN Certified Nurse-Midwife 08/18/2015 9:52 PM

## 2015-08-18 NOTE — MAU Note (Addendum)
Thought i might be pregnant. Was having some nausea but upt negative. Yesterday started having lower abd pain and bleeding. Thought it was my period so tried to put in tampon and very painful. Hurts to sit or walk. No bleeding on pad but if I try to put in tampon there is blood on tampon. LMP 07/25/15.

## 2015-08-18 NOTE — Discharge Instructions (Signed)
Dysmenorrhea Dysmenorrhea is pain during a menstrual period. You will have pain in the lower belly (abdomen). The pain is caused by the tightening (contracting) of the muscles of the uterus. The pain can be minor or severe. Headache, feeling sick to your stomach (nausea), throwing up (vomiting), or low back pain may occur with this condition. HOME CARE  Only take medicine as told by your doctor.  Place a heating pad or hot water bottle on your lower back or belly. Do not sleep with a heating pad.  Exercise may help lessen the pain.  Massage the lower back or belly.  Stop smoking.  Avoid alcohol and caffeine. GET HELP IF:   Your pain does not get better with medicine.  You have pain during sex.  Your pain gets worse while taking pain medicine.  Your period bleeding is heavier than normal.  You keep feeling sick to your stomach or keep throwing up. GET HELP RIGHT AWAY IF: You pass out (faint).   This information is not intended to replace advice given to you by your health care provider. Make sure you discuss any questions you have with your health care provider.   Document Released: 09/12/2008 Document Revised: 06/21/2013 Document Reviewed: 12/02/2012 Elsevier Interactive Patient Education 2016 Elsevier Inc[employer] Pelvic Pain, Female Female pelvic pain can be caused by many different things and start from a variety of places. Pelvic pain refers to pain that is located in the lower half of the abdomen and between your hips. The pain may occur over a short period of time (acute) or may be reoccurring (chronic). The cause of pelvic pain may be related to disorders affecting the female reproductive organs (gynecologic), but it may also be related to the bladder, kidney stones, an intestinal complication, or muscle or skeletal problems. Getting help right away for pelvic pain is important, especially if there has been severe, sharp, or a sudden onset of unusual pain. It is also important to  get help right away because some types of pelvic pain can be life threatening.  CAUSES  Below are only some of the causes of pelvic pain. The causes of pelvic pain can be in one of several categories.  Gynecologic. Pelvic inflammatory disease. Sexually transmitted infection. Ovarian cyst or a twisted ovarian ligament (ovarian torsion). Uterine lining that grows outside the uterus (endometriosis). Fibroids, cysts, or tumors. Ovulation. Pregnancy. Pregnancy that occurs outside the uterus (ectopic pregnancy). Miscarriage. Labor. Abruption of the placenta or ruptured uterus. Infection. Uterine infection (endometritis). Bladder infection. Diverticulitis. Miscarriage related to a uterine infection (septic abortion). Bladder. Inflammation of the bladder (cystitis). Kidney stone(s). Gastrointestinal. Constipation. Diverticulitis. Neurologic. Trauma. Feeling pelvic pain because of mental or emotional causes (psychosomatic). Cancers of the bowel or pelvis. EVALUATION  Your caregiver will want to take a careful history of your concerns. This includes recent changes in your health, a careful gynecologic history of your periods (menses), and a sexual history. Obtaining your family history and medical history is also important. Your caregiver may suggest a pelvic exam. A pelvic exam will help identify the location and severity of the pain. It also helps in the evaluation of which organ system may be involved. In order to identify the cause of the pelvic pain and be properly treated, your caregiver may order tests. These tests may include:  A pregnancy test. Pelvic ultrasonography. An X-ray exam of the abdomen. A urinalysis or evaluation of vaginal discharge. Blood tests. HOME CARE INSTRUCTIONS  Only take over-the-counter or prescription medicines for pain,  discomfort, or fever as directed by your caregiver.  Rest as directed by your caregiver.  Eat a balanced diet.  Drink enough fluids  to make your urine clear or pale yellow, or as directed.  Avoid sexual intercourse if it causes pain.  Apply warm or cold compresses to the lower abdomen depending on which one helps the pain.  Avoid stressful situations.  Keep a journal of your pelvic pain. Write down when it started, where the pain is located, and if there are things that seem to be associated with the pain, such as food or your menstrual cycle. Follow up with your caregiver as directed.  SEEK MEDICAL CARE IF: Your medicine does not help your pain. You have abnormal vaginal discharge. SEEK IMMEDIATE MEDICAL CARE IF:  You have heavy bleeding from the vagina.  Your pelvic pain increases.  You feel light-headed or faint.  You have chills.  You have pain with urination or blood in your urine.  You have uncontrolled diarrhea or vomiting.  You have a fever or persistent symptoms for more than 3 days. You have a fever and your symptoms suddenly get worse.  You are being physically or sexually abused.   This information is not intended to replace advice given to you by your health care provider. Make sure you discuss any questions you have with your health care provider.   Document Released: 05/13/2004 Document Revised: 03/07/2015 Document Reviewed: 10/06/2011 Elsevier Interactive Patient Education 2016 Elsevier Inc. Pelvic Inflammatory Disease Pelvic inflammatory disease (PID) refers to an infection in some or all of the female organs. The infection can be in the uterus, ovaries, fallopian tubes, or the surrounding tissues in the pelvis. PID can cause abdominal or pelvic pain that comes on suddenly (acute pelvic pain). PID is a serious infection because it can lead to lasting (chronic) pelvic pain or the inability to have children (infertility). CAUSES This condition is most often caused by an infection that is spread during sexual contact. However, the infection can also be caused by the normal bacteria that are  found in the vaginal tissues if these bacteria travel upward into the reproductive organs. PID can also occur following:  The birth of a baby.  A miscarriage.  An abortion.  Major pelvic surgery.  The use of an intrauterine device (IUD).  A sexual assault. RISK FACTORS This condition is more likely to develop in women who:  Are younger than 27 years of age.  Are sexually active at [facility] age.  Use nonbarrier contraception.  Have multiple sexual partners.  Have sex with someone who has symptoms of an STD (sexually transmitted disease).  Use oral contraception. At times, certain behaviors can also increase the possibility of getting PID, such as:  Using a vaginal douche.  Having an IUD in place. SYMPTOMS Symptoms of this condition include:  Abdominal or pelvic pain.  Fever.  Chills.  Abnormal vaginal discharge.  Abnormal uterine bleeding.  Unusual pain shortly after the end of a menstrual period.  Painful urination.  Pain with sexual intercourse.  Nausea and vomiting. DIAGNOSIS To diagnose this condition, your health care provider will do a physical exam and take your medical history. A pelvic exam typically reveals great tenderness in the uterus and the surrounding pelvic tissues. You may also have tests, such as:  Lab tests, including a pregnancy test, blood tests, and urine test.  Culture tests of the vagina and cervix to check for an STD.  Ultrasound.  A laparoscopic procedure to look  inside the pelvis.  Examining vaginal secretions under a microscope. TREATMENT Treatment for this condition may involve one or more approaches.  Antibiotic medicines may be prescribed to be taken by mouth.  Sexual partners may need to be treated if the infection is caused by an STD.  For more severe cases, hospitalization may be needed to give antibiotics directly into a vein through an IV tube.  Surgery may be needed if other treatments do not help, but this  is rare. It may take weeks until you are completely well. If you are diagnosed with PID, you should also be checked for human immunodeficiency virus (HIV). Your health care provider may test you for infection again 3 months after treatment. You should not have unprotected sex. HOME CARE INSTRUCTIONS  Take over-the-counter and prescription medicines only as told by your health care provider.  If you were prescribed an antibiotic medicine, take it as told by your health care provider. Do not stop taking the antibiotic even if you start to feel better.  Do not have sexual intercourse until treatment is completed or as told by your health care provider. If PID is confirmed, your recent sexual partners will need treatment, especially if you had unprotected sex.  Keep all follow-up visits as told by your health care provider. This is important. SEEK MEDICAL CARE IF:  You have increased or abnormal vaginal discharge.  Your pain does not improve.  You vomit.  You have a fever.  You cannot tolerate your medicines.  Your partner has an STD.  You have pain when you urinate. SEEK IMMEDIATE MEDICAL CARE IF:  You have increased abdominal or pelvic pain.  You have chills.  Your symptoms are not better in 72 hours even with treatment.   This information is not intended to replace advice given to you by your health care provider. Make sure you discuss any questions you have with your health care provider.   Document Released: 06/16/2005 Document Revised: 03/07/2015 Document Reviewed: 07/24/2014 Elsevier Interactive Patient Education [employer].

## 2015-08-19 LAB — HIV ANTIBODY (ROUTINE TESTING W REFLEX): HIV Screen 4th Generation wRfx: NONREACTIVE

## 2015-08-20 LAB — GC/CHLAMYDIA PROBE AMP (~~LOC~~) NOT AT ARMC
Chlamydia: NEGATIVE
Neisseria Gonorrhea: NEGATIVE

## 2015-09-14 ENCOUNTER — Emergency Department (HOSPITAL_COMMUNITY)
Admission: EM | Admit: 2015-09-14 | Discharge: 2015-09-14 | Disposition: A | Discharge status: Home / Self Care | Payer: Medicaid Other | Attending: Emergency Medicine | Admitting: Emergency Medicine

## 2015-09-14 ENCOUNTER — Emergency Department (HOSPITAL_COMMUNITY): Payer: Medicaid Other

## 2015-09-14 ENCOUNTER — Encounter (HOSPITAL_COMMUNITY): Payer: Self-pay

## 2015-09-14 DIAGNOSIS — G43819 Other migraine, intractable, without status migrainosus: Secondary | ICD-10-CM | POA: Diagnosis not present

## 2015-09-14 DIAGNOSIS — Z87891 Personal history of nicotine dependence: Secondary | ICD-10-CM | POA: Insufficient documentation

## 2015-09-14 DIAGNOSIS — Z8619 Personal history of other infectious and parasitic diseases: Secondary | ICD-10-CM | POA: Insufficient documentation

## 2015-09-14 DIAGNOSIS — Z880 Allergy status to penicillin: Secondary | ICD-10-CM | POA: Insufficient documentation

## 2015-09-14 DIAGNOSIS — Z79899 Other long term (current) drug therapy: Secondary | ICD-10-CM | POA: Insufficient documentation

## 2015-09-14 DIAGNOSIS — Z3202 Encounter for pregnancy test, result negative: Secondary | ICD-10-CM | POA: Diagnosis not present

## 2015-09-14 DIAGNOSIS — M25561 Pain in right knee: Secondary | ICD-10-CM | POA: Diagnosis not present

## 2015-09-14 DIAGNOSIS — M25461 Effusion, right knee: Secondary | ICD-10-CM | POA: Insufficient documentation

## 2015-09-14 DIAGNOSIS — J45909 Unspecified asthma, uncomplicated: Secondary | ICD-10-CM | POA: Diagnosis not present

## 2015-09-14 LAB — I-STAT BETA HCG BLOOD, ED (MC, WL, AP ONLY): I-stat hCG, quantitative: <5 m[IU]/mL (ref <5)

## 2015-09-14 MED ORDER — HYDROMORPHONE HCL 1 MG/ML IJ SOLN
1.0000 mg | Freq: Once | INTRAMUSCULAR | Status: AC
  Administered 2015-09-14: 1 mg via INTRAVENOUS
  Filled 2015-09-14: qty 1

## 2015-09-14 MED ORDER — DEXAMETHASONE SODIUM PHOSPHATE 10 MG/ML IJ SOLN
10.0000 mg | Freq: Once | INTRAMUSCULAR | Status: AC
  Administered 2015-09-14: 10 mg via INTRAVENOUS
  Filled 2015-09-14: qty 1

## 2015-09-14 MED ORDER — ONDANSETRON HCL 4 MG PO TABS: 4.0000 mg | ORAL_TABLET | Freq: Four times a day (QID) | ORAL | Status: DC

## 2015-09-14 MED ORDER — DIPHENHYDRAMINE HCL 50 MG/ML IJ SOLN
25.0000 mg | Freq: Once | INTRAMUSCULAR | Status: AC
  Administered 2015-09-14: 25 mg via INTRAVENOUS
  Filled 2015-09-14: qty 1

## 2015-09-14 MED ORDER — ONDANSETRON 4 MG PO TBDP
ORAL_TABLET | ORAL | Status: AC
  Filled 2015-09-14: qty 1

## 2015-09-14 MED ORDER — KETOROLAC TROMETHAMINE 30 MG/ML IJ SOLN
30.0000 mg | Freq: Once | INTRAMUSCULAR | Status: AC
  Administered 2015-09-14: 30 mg via INTRAVENOUS
  Filled 2015-09-14: qty 1

## 2015-09-14 MED ORDER — METOCLOPRAMIDE HCL 5 MG/ML IJ SOLN
10.0000 mg | Freq: Once | INTRAMUSCULAR | Status: AC
  Administered 2015-09-14: 10 mg via INTRAVENOUS
  Filled 2015-09-14: qty 2

## 2015-09-14 MED ORDER — ONDANSETRON 4 MG PO TBDP
4.0000 mg | ORAL_TABLET | ORAL | Status: AC
  Administered 2015-09-14: 4 mg via ORAL

## 2015-09-14 NOTE — ED Provider Notes (Signed)
CSN: [id number]     Arrival date & time 09/14/15  1721 History   First MD Initiated Contact with Patient 09/14/15 2011     Chief Complaint  Patient presents with  . Migraine  . Leg Pain     (Consider location/radiation/quality/duration/timing/severity/associated sxs/prior Treatment) HPI   Instrument with a past medical history of migraines, asthma, headache during pregnancy presents to the emergency department with complaints of a migraine headache.  She gets headaches that are of the same quality with the same symptoms of N/V/photophobia  But this is more severe than her usual. She typically has pain all over her head and it feels like a vice grip. She has tried sleeping, Percocet, Ibuprofen and Tylenol at home without any improvement. She admits that she has been vomiting up her medications. She is wearing sunglasses during the HPI.  She also reports right knee pain that started 2 weeks ago. She inciting injury, it is crampy. She has been applying ice and elevated. She reports that it is swollen. Denies being unable to apply pressure. Denies that the joint is hot or that she is unable to bend it without pain.  PCP: Ricke Hey, MD  ZABELLA CHAFFEE is a 25 y.o.  female  ROS: The patient denies diaphoresis, fever, weakness (general or focal), confusion, change of vision,  dysphagia, aphagia, shortness of breath,  abdominal pains, nausea, vomiting, diarrhea,  rash, neck pain, chest pain   Past Medical History  Diagnosis Date  . Asthma   . Herpes simplex of female genitalia     last outbreak 4 months ago  . Migraines   . Headache in pregnancy   . Migraine    Past Surgical History  Procedure Laterality Date  . Colposcopy  03/2014  . Cholecystectomy N/A 01/17/2015    Procedure: LAPAROSCOPIC CHOLECYSTECTOMY WITH INTRAOPERATIVE CHOLANGIOGRAM;  Surgeon: Excell Seltzer, MD;  Location: [facility];  Service: General;  Laterality: N/A;   Family History  Problem Relation Age of Onset   . Asthma Mother   . Asthma Sister   . Asthma Sister    Social History  Substance Use Topics  . Smoking status: Former Smoker -- 0.25 packs/day for 2 years    Types: Cigarettes    Quit date: 05/02/2013  . Smokeless tobacco: Never Used  . Alcohol Use: No     Comment: socially   OB History    Gravida Para Term Preterm AB TAB SAB Ectopic Multiple Living   3 3 3  0 0 0 0 0 0 3     Review of Systems  Review of Systems All other systems negative except as documented in the HPI. All pertinent positives and negatives as reviewed in the HPI.   Allergies  Shellfish allergy; Other; and Penicillins  Home Medications   Prior to Admission medications   Medication Sig Start Date End Date Taking? Authorizing Provider  acetaminophen (TYLENOL) 500 MG tablet Take 1,000-1,500 mg by mouth every 6 (six) hours as needed for moderate pain.    Yes Historical Provider, MD  albuterol (PROVENTIL HFA;VENTOLIN HFA) 108 (90 BASE) MCG/ACT inhaler Inhale 2 puffs into the lungs every 6 (six) hours as needed for wheezing or shortness of breath.   Yes Historical Provider, MD  oxyCODONE-acetaminophen (PERCOCET/ROXICET) 5-325 MG per tablet Take 1-2 tablets by mouth every 6 (six) hours as needed for severe pain. 01/14/15  Yes Luvenia Redden, PA-C  doxycycline (VIBRAMYCIN) 100 MG capsule Take 1 capsule (100 mg total) by mouth 2 (two) times  daily. Patient not taking: Reported on 09/14/2015 08/18/15   Seabron Spates, CNM  ibuprofen (ADVIL,MOTRIN) 600 MG tablet Take 1 tablet (600 mg total) by mouth every 6 (six) hours as needed. Patient not taking: Reported on 09/14/2015 08/18/15   Seabron Spates, CNM  ondansetron (ZOFRAN) 4 MG tablet Take 1 tablet (4 mg total) by mouth every 6 (six) hours. 09/14/15   Milliani Herrada Carlota Raspberry, PA-C   BP 93/53 mmHg  Pulse 56  Temp(Src) 98.1 F (36.7 C) (Oral)  Resp 18  Ht 5\' 1"  (1.549 m)  Wt 70.109 kg  BMI 29.22 kg/m2  SpO2 98%  LMP 07/25/2015 Physical Exam  Constitutional: She is  oriented to person, place, and time. She appears well-developed and well-nourished. No distress.  HENT:  Head: Normocephalic and atraumatic.  Right Ear: Tympanic membrane and ear canal normal.  Left Ear: Tympanic membrane and ear canal normal.  Nose: Nose normal.  Mouth/Throat: Uvula is midline, oropharynx is clear and moist and mucous membranes are normal.  Eyes: Pupils are equal, round, and reactive to light.  Neck: Normal range of motion. Neck supple.  Cardiovascular: Normal rate and regular rhythm.   Pulmonary/Chest: Effort normal.  Abdominal: Soft.  No signs of abdominal distention  Musculoskeletal:       Right knee: She exhibits effusion. She exhibits normal range of motion, no swelling, no ecchymosis, no deformity, no laceration, no erythema, normal alignment, no LCL laxity and normal patellar mobility. Tenderness found. No patellar tendon tenderness noted.  No LE swelling. No induration or crepitus. Non septic appearing joint.  Neurological: She is alert and oriented to person, place, and time.  Cranial nerves grossly intact on exam. Pt alert and oriented x 3 Upper and lower extremity strength is symmetrical and physiologic Normal muscular tone No facial droop Coordination intact, no limb ataxia,No pronator drift  Skin: Skin is warm and dry. No rash noted.  Nursing note and vitals reviewed.     ED Course  Procedures (including critical care time) Labs Review Labs Reviewed  I-STAT BETA HCG BLOOD, ED ([facility], [facility], [facility] ONLY)    Imaging Review Dg Knee Complete 4 Views Right  09/14/2015  CLINICAL DATA:  Nontraumatic right lower extremity pain. EXAM: RIGHT KNEE - COMPLETE 4+ VIEW COMPARISON:  None. FINDINGS: There is an osteochondroma at the lateral aspect of the proximal tibia, distal to the proximal tibiofibular articulation. There is no fracture or other acute bony abnormality. There is no bony destruction. No arthritic changes are evident. No acute soft tissue abnormality is  evident. IMPRESSION: Proximal tibial osteochondroma.  No acute findings are evident. Electronically Signed   By: Andreas Newport M.D.   On: 09/14/2015 21:22   I have personally reviewed and evaluated these images and lab results as part of my medical decision-making.   EKG Interpretation None      MDM   Final diagnoses:  Other migraine without status migrainosus, intractable  Right knee pain    Medications  ondansetron (ZOFRAN-ODT) 4 MG disintegrating tablet (not administered)  ondansetron (ZOFRAN-ODT) disintegrating tablet 4 mg (4 mg Oral Given 09/14/15 1738)  diphenhydrAMINE (BENADRYL) injection 25 mg (25 mg Intravenous Given 09/14/15 2033)  dexamethasone (DECADRON) injection 10 mg (10 mg Intravenous Given 09/14/15 2033)  ketorolac (TORADOL) 30 MG/ML injection 30 mg (30 mg Intravenous Given 09/14/15 2033)  HYDROmorphone (DILAUDID) injection 1 mg (1 mg Intravenous Given 09/14/15 2126)    Patient has osteochondroma to proximal tibia on x-ray. Otherwise no significant abnormalities. On further questioning she  states that she sometimes gets joint pains in other joints, one at a time. She never gets fever or sick with this. She has not discussed with her primary care doctor, she has been encouraged to do so. I will have her take NSAIDs, provide a knee sleeve and crutches and have her rest for 1 week. Her headache is improved from the 9-10 out of 10 down to 5 out of 10. She feels comfortable with going home at this point.  Pt HA treated and improved while in ED.  Presentation is like pts typical HA and non concerning for [facility], ICH, Meningitis, or temporal arteritis. Pt is afebrile with no focal neuro deficits, nuchal rigidity, or change in vision. Pt is to follow up with PCP to discuss prophylactic medication. Pt verbalizes understanding and is agreeable with plan to dc.   We'll prescribe Zofran for home.   Delos Haring, PA-C 09/14/15 [place], MD 09/14/15 [phone]

## 2015-09-14 NOTE — ED Notes (Signed)
PT. HAVING A HEADACHE THAT BEGAN YESTERDAY, SHE HAS HX OF MIGRAINES.  ALSO HAS N/V, PHOTOPHOBIA ,  RT. LEG PAIN, DENIES ANY INJURY.;   FEELS LIKE SOMEONE IS PINCHING HER LEG THOSE SYMPTOMS STARTED  2 WEEKS AGO. RT. KNEE IS SWOLLEN AND PAINFUL.

## 2015-09-14 NOTE — Progress Notes (Signed)
Orthopedic Tech Progress Note Patient Details:  Katrina [name] 1991/02/14 [id number] Applied elastic knee sleeve to RLE.  Pulses, sensation, motion intact before and after application.  Capillary refill less than 2 seconds before and after application.  Fit pt. for crutches and taught use of same. Ortho Devices Type of Ortho Device: Knee Sleeve, Crutches Ortho Device/Splint Location: RLE Ortho Device/Splint Interventions: Application   Darrol Poke 09/14/2015, 10:46 PM

## 2015-09-14 NOTE — Discharge Instructions (Signed)
Joint Pain Joint pain, which is also called arthralgia, can be caused by many things. Joint pain often goes away when you follow your health care provider's instructions for relieving pain at home. However, joint pain can also be caused by conditions that require further treatment. Common causes of joint pain include:  Bruising in the area of the joint.  Overuse of the joint.  Wear and tear on the joints that occur with aging (osteoarthritis).  Various other forms of arthritis.  A buildup of a crystal form of uric acid in the joint (gout).  Infections of the joint (septic arthritis) or of the bone (osteomyelitis). Your health care provider may recommend medicine to help with the pain. If your joint pain continues, additional tests may be needed to diagnose your condition. HOME CARE INSTRUCTIONS Watch your condition for any changes. Follow these instructions as directed to lessen the pain that you are feeling.  Take medicines only as directed by your health care provider.  Rest the affected area for as long as your health care provider says that you should. If directed to do so, raise the painful joint above the level of your heart while you are sitting or lying down.  Do not do things that cause or worsen pain.  If directed, apply ice to the painful area:  Put ice in a plastic bag.  Place a towel between your skin and the bag.  Leave the ice on for 20 minutes, 2-3 times per day.  Wear an elastic bandage, splint, or sling as directed by your health care provider. Loosen the elastic bandage or splint if your fingers or toes become numb and tingle, or if they turn cold and blue.  Begin exercising or stretching the affected area as directed by your health care provider. Ask your health care provider what types of exercise are safe for you.  Keep all follow-up visits as directed by your health care provider. This is important. SEEK MEDICAL CARE IF:  Your pain increases, and medicine  does not help.  Your joint pain does not improve within 3 days.  You have increased bruising or swelling.  You have a fever.  You lose 10 lb (4.5 kg) or more without trying. SEEK IMMEDIATE MEDICAL CARE IF:  You are not able to move the joint.  Your fingers or toes become numb or they turn cold and blue.   This information is not intended to replace advice given to you by your health care provider. Make sure you discuss any questions you have with your health care provider.   Document Released: 06/16/2005 Document Revised: 07/07/2014 Document Reviewed: 03/28/2014 Elsevier Interactive Patient Education 2016 [employer]. Migraine Headache A migraine headache is an intense, throbbing pain on one or both sides of your head. A migraine can last for 30 minutes to several hours. CAUSES  The exact cause of a migraine headache is not always known. However, a migraine may be caused when nerves in the brain become irritated and release chemicals that cause inflammation. This causes pain. Certain things may also trigger migraines, such as:  Alcohol.  Smoking.  Stress.  Menstruation.  Aged cheeses.  Foods or drinks that contain nitrates, glutamate, aspartame, or tyramine.  Lack of sleep.  Chocolate.  Caffeine.  Hunger.  Physical exertion.  Fatigue.  Medicines used to treat chest pain (nitroglycerine), birth control pills, estrogen, and some blood pressure medicines. SIGNS AND SYMPTOMS  Pain on one or both sides of your head.  Pulsating or throbbing  pain.  Severe pain that prevents daily activities.  Pain that is aggravated by any physical activity.  Nausea, vomiting, or both.  Dizziness.  Pain with exposure to bright lights, loud noises, or activity.  General sensitivity to bright lights, loud noises, or smells. Before you get a migraine, you may get warning signs that a migraine is coming (aura). An aura may include:  Seeing flashing lights.  Seeing bright  spots, halos, or zigzag lines.  Having tunnel vision or blurred vision.  Having feelings of numbness or tingling.  Having trouble talking.  Having muscle weakness. DIAGNOSIS  A migraine headache is often diagnosed based on:  Symptoms.  Physical exam.  A CT scan or MRI of your head. These imaging tests cannot diagnose migraines, but they can help rule out other causes of headaches. TREATMENT Medicines may be given for pain and nausea. Medicines can also be given to help prevent recurrent migraines.  HOME CARE INSTRUCTIONS  Only take over-the-counter or prescription medicines for pain or discomfort as directed by your health care provider. The use of long-term narcotics is not recommended.  Lie down in a dark, quiet room when you have a migraine.  Keep a journal to find out what may trigger your migraine headaches. For example, write down:  What you eat and drink.  How much sleep you get.  Any change to your diet or medicines.  Limit alcohol consumption.  Quit smoking if you smoke.  Get 7-9 hours of sleep, or as recommended by your health care provider.  Limit stress.  Keep lights dim if bright lights bother you and make your migraines worse. SEEK IMMEDIATE MEDICAL CARE IF:   Your migraine becomes severe.  You have a fever.  You have a stiff neck.  You have vision loss.  You have muscular weakness or loss of muscle control.  You start losing your balance or have trouble walking.  You feel faint or pass out.  You have severe symptoms that are different from your first symptoms. MAKE SURE YOU:   Understand these instructions.  Will watch your condition.  Will get help right away if you are not doing well or get worse.   This information is not intended to replace advice given to you by your health care provider. Make sure you discuss any questions you have with your health care provider.   Document Released: 06/16/2005 Document Revised: 07/07/2014  Document Reviewed: 02/21/2013 Elsevier Interactive Patient Education [employer].

## 2015-09-14 NOTE — ED Notes (Signed)
Pt. Is nauseated and stated, "I vomited my Percocet 5 up."  Zofran 4mg  odt given

## 2015-09-27 ENCOUNTER — Encounter: Payer: Self-pay | Admitting: Sports Medicine

## 2015-09-27 ENCOUNTER — Ambulatory Visit (INDEPENDENT_AMBULATORY_CARE_PROVIDER_SITE_OTHER): Payer: Medicaid Other | Admitting: Sports Medicine

## 2015-09-27 VITALS — BP 116/63 | Ht 61.0 in | Wt 154.0 lb

## 2015-09-27 DIAGNOSIS — M25461 Effusion, right knee: Secondary | ICD-10-CM | POA: Diagnosis not present

## 2015-09-27 DIAGNOSIS — M25561 Pain in right knee: Secondary | ICD-10-CM | POA: Insufficient documentation

## 2015-09-27 NOTE — Addendum Note (Signed)
Addended by: Cyd Silence on: 09/27/2015 11:16 AM   Modules accepted: Orders

## 2015-09-27 NOTE — Assessment & Plan Note (Signed)
No specific injury which makes this more concerning for PVNS versus a synovitis/reactive process. She does not have any systemic symptoms but could consider gonorrhea monoarthritis. She was tx for PID about one month ago in the [facility] and her GC/Chlamydia at that time was negative.  There is no erythema or warmth of the knee however and is afebrile w/o skin lesions.  As well consider rheumatologic conditions if her MRI does not show underlying process including lab work with ESR/CRP/RF/Anti CCP/ANA/CBCAD.   - F/U in one week to discuss results as well as consider further tx as well as aspiration with Cx/Crystals/Count/gram stain and possible lab work.  If starts getting warmth in her knee/fever/chills, she needs to go to ED for aspiration/tx or call immediately.

## 2015-09-27 NOTE — Progress Notes (Signed)
  Katrina [name] - 25 y.o. female MRN [id number]  Date of birth: 1990/10/13  SUBJECTIVE:  Including CC & ROS.  Katrina [name] is a 25 y.o. female who presents today for R knee pain.    Knee Pain R, initial visit 09/27/15 - patient presents today for ongoing right knee pain and subjective swelling. This is been ongoing now for 3 weeks with no inciting injury. She has never had a previous right knee injury before. She does describe occasionally having other joints that swell and are painful. She has been worked up for this in the past. She is seen in the emergency room on 3/17 where a x-ray was done of the right knee which showed a proximal tibial osteochondroma but was otherwise negative. She denies any fevers, chills, sweats, redness in the area, skin infections, GI problems or thigh issues including diplopia, dryness, blurred vision. In the emergency room they did give her anti-inflammatories which she's been taking off and on. She also has been on crutches and a knee sleeve which has helped somewhat. Of note she was treated in February for presumptive PID and her GC/Chlamydia were both negative  PMHx - Updated and reviewed.  Contributory factors include: Cholestasis of pregnancy PSHx - Updated and reviewed.  Contributory factors include:  Cholecystectomy FHx - Updated and reviewed.  Contributory factors include:  Asthma maternal Social Hx - Updated and reviewed. Contributory factors include: Nonsmoker  Medications - updated and reviewed   12 point ROS negative other than per HPI.   Exam:  Filed Vitals:   09/27/15 1046  BP: 116/63   Gen: NAD, AAO 3 Cardio- RRR Pulm - Normal respiratory effort/rate Skin: No rashes or erythema Extremities: No edema  Vascular: pulses +2 bilateral upper and lower extremity Psych: Normal affect   Knee:  Normal to inspection with no erythema or effusion or obvious bony abnormalities.  No obvious Baker's cysts Palpation normal with no warmth or joint line  tenderness or patellar tenderness or condyle tenderness.  No TTP along infrapatellar or pes anserine bursas.   ROM normal in flexion (135 degrees) and extension (0 degrees) and lower leg rotation. Ligaments with solid consistent endpoints including ACL, PCL, LCL, MCL.  Negative Anterior Drawer/Lachman/Pivot Shift Negative Mcmurray's and provocative meniscal tests including Thessaly and Apley compression testing  Non painful patellar compression.  Normal Patellar glide.  No apprehension  Negative Dial Test  Patellar and quadriceps tendons unremarkable. Hamstring and quadriceps strength is normal.  Neurovascularly intact B/L LE   Imaging:  3/17 4 view knee, non weight bearing shows proximal tibial hyperlucency c/w osteochondroma but otherwise negative.

## 2015-10-01 ENCOUNTER — Other Ambulatory Visit: Payer: Self-pay | Admitting: Obstetrics and Gynecology

## 2015-10-04 ENCOUNTER — Other Ambulatory Visit: Payer: Self-pay | Admitting: *Deleted

## 2015-10-04 DIAGNOSIS — M25461 Effusion, right knee: Secondary | ICD-10-CM

## 2015-10-05 ENCOUNTER — Inpatient Hospital Stay: Admission: RE | Admit: 2015-10-05 | Payer: Medicaid Other | Source: Ambulatory Visit

## 2015-10-09 ENCOUNTER — Ambulatory Visit: Payer: Medicaid Other | Attending: Family Medicine | Admitting: Physical Therapy

## 2015-10-10 ENCOUNTER — Ambulatory Visit: Payer: Medicaid Other | Admitting: Family Medicine

## 2015-10-15 ENCOUNTER — Ambulatory Visit (INDEPENDENT_AMBULATORY_CARE_PROVIDER_SITE_OTHER): Payer: Medicaid Other | Admitting: Family Medicine

## 2015-10-15 ENCOUNTER — Encounter: Payer: Self-pay | Admitting: Family Medicine

## 2015-10-15 VITALS — BP 126/78 | HR 81 | Ht 61.0 in | Wt 154.0 lb

## 2015-10-15 DIAGNOSIS — M25461 Effusion, right knee: Secondary | ICD-10-CM

## 2015-10-15 MED ORDER — HYDROCODONE-ACETAMINOPHEN 5-325 MG PO TABS: 1.0000 | ORAL_TABLET | Freq: Four times a day (QID) | ORAL | Status: DC | PRN

## 2015-10-15 MED ORDER — PREDNISONE 10 MG PO TABS: ORAL_TABLET | ORAL | Status: DC

## 2015-10-15 MED FILL — HYDROCODON-APAP 5-325: 5-325 | 10 days supply | Qty: 40 | Fill #0

## 2015-10-15 NOTE — Patient Instructions (Signed)
Try the prednisone dose pack as directed for 6 days. Norco as needed for severe pain (no driving on this medicine). Call me if you want to do the cortisone injection. May 11th is when I would want you to follow up here or in the [place] office otherwise. This would be 6 weeks which is required for you to complete conservative treatment for MRI approval.

## 2015-10-16 ENCOUNTER — Telehealth: Payer: Self-pay | Admitting: Family Medicine

## 2015-10-16 NOTE — Telephone Encounter (Signed)
Called back. No answer. Will try again.

## 2015-10-16 NOTE — Telephone Encounter (Signed)
Spoke to patient and told her to stop taking the hydrocodone and take the prednisone. Also told patient to get a knee sleeve.

## 2015-10-16 NOTE — Telephone Encounter (Signed)
She should stick with the prednisone we prescribed, stop the hydrocodone.  According to the narcotic database her PCP has been prescribing her oxycodone.  She can take this instead in addition to the prednisone.

## 2015-10-16 NOTE — Assessment & Plan Note (Signed)
Patient has a lack of injury, no effusion though she reports a lot of swelling when pain started.  MRI ordered by denied because patient has not done at least 6 weeks of conservative treatment.  Independently reviewed radiographs - nothing to account for her pain (has osteochondroma proximal tibia).  PVNS is a consideration.  Meniscus tear is unlikely - same with gout.  Other inflammatory arthritis is another possibility.  Discussed options - she would like to try prednisone dose pack, norco as needed instead of trial of intraarticular injection.  Call us if she would like to try injection otherwise follow up on or after 5/11 when she is 6 weeks out.

## 2015-10-16 NOTE — Progress Notes (Signed)
PCP: Ricke Hey, MD  Subjective:   HPI: Patient is a 25 y.o. female here for right knee pain.  3/30: Knee Pain R, initial visit 09/27/15 - patient presents today for ongoing right knee pain and subjective swelling. This is been ongoing now for 3 weeks with no inciting injury. She has never had a previous right knee injury before. She does describe occasionally having other joints that swell and are painful. She has been worked up for this in the past. She is seen in the emergency room on 3/17 where a x-ray was done of the right knee which showed a proximal tibial osteochondroma but was otherwise negative. She denies any fevers, chills, sweats, redness in the area, skin infections, GI problems or thigh issues including diplopia, dryness, blurred vision. In the emergency room they did give her anti-inflammatories which she's been taking off and on. She also has been on crutches and a knee sleeve which has helped somewhat. Of note she was treated in February for presumptive PID and her GC/Chlamydia were both negative  4/17: Patient reports she has continued to have 9/10 level pain in right knee. Pain is severe, sharp, radiates up to hip. Described as a stabbing pain. Doing home exercises and icing as directed. No skin changes, fever. No catching, locking, giving out. Knee sleeve helped but fell apart.  Past Medical History  Diagnosis Date  . Asthma   . Herpes simplex of female genitalia     last outbreak 4 months ago  . Migraines   . Headache in pregnancy   . Migraine     Current Outpatient Prescriptions on File Prior to Visit  Medication Sig Dispense Refill  . acetaminophen (TYLENOL) 500 MG tablet Take 1,000-1,500 mg by mouth every 6 (six) hours as needed for moderate pain.     Marland Kitchen albuterol (PROVENTIL HFA;VENTOLIN HFA) 108 (90 BASE) MCG/ACT inhaler Inhale 2 puffs into the lungs every 6 (six) hours as needed for wheezing or shortness of breath.     No current facility-administered  medications on file prior to visit.    Past Surgical History  Procedure Laterality Date  . Colposcopy  03/2014  . Cholecystectomy N/A 01/17/2015    Procedure: LAPAROSCOPIC CHOLECYSTECTOMY WITH INTRAOPERATIVE CHOLANGIOGRAM;  Surgeon: Excell Seltzer, MD;  Location: [facility];  Service: General;  Laterality: N/A;    Allergies  Allergen Reactions  . Shellfish Allergy Shortness Of Breath and Swelling    Tongue  . Other     Tylenol #3---Swelling   . Penicillins Itching, Swelling and Rash    Sweating, Has patient had a PCN reaction causing immediate rash, facial/tongue/throat swelling, SOB or lightheadedness with hypotension: NO Has patient had a PCN reaction causing severe rash involving mucus membranes or skin necrosis:NO Has patient had a PCN reaction that required hospitalization NO Has patient had a PCN reaction occurring within the last 10 years:NO If all of the above answers are "NO", then may proceed with Cephalosporin use.      Social History   Social History  . Marital Status: Single    Spouse Name: N/A  . Number of Children: 2  . Years of Education: 8 th   Occupational History  .  Unemployed   Social History Main Topics  . Smoking status: Former Smoker -- 0.25 packs/day for 2 years    Types: Cigarettes    Quit date: 05/02/2013  . Smokeless tobacco: Never Used  . Alcohol Use: No     Comment: socially  . Drug Use:  No  . Sexual Activity: Yes    Birth Control/ Protection: None   Other Topics Concern  . Not on file   Social History Narrative   Patient is single and lives at home with her family.   Patient is unemployed.   Education 8 th grade.   Right handed.   Caffeine one cup of coffee daily.    Family History  Problem Relation Age of Onset  . Asthma Mother   . Asthma Sister   . Asthma Sister     BP 126/78 mmHg  Pulse 81  Ht 5\' 1"  (1.549 m)  Wt 154 lb (69.854 kg)  BMI 29.11 kg/m2  Review of Systems: See HPI above.    Objective:  Physical  Exam:  Gen: NAD, comfortable in exam room  Right knee: No gross deformity, ecchymoses, effusion. Diffuse anterior tenderness. FROM. Negative ant/post drawers. Negative valgus/varus testing. Negative lachmanns. Negative mcmurrays, apleys, patellar apprehension. NV intact distally.    Left knee: FROM without pain.  MSK u/s confirms lack of effusion.  Assessment & Plan:  1. Right knee pain - Patient has a lack of injury, no effusion though she reports a lot of swelling when pain started.  MRI ordered by denied because patient has not done at least 6 weeks of conservative treatment.  Independently reviewed radiographs - nothing to account for her pain (has osteochondroma proximal tibia).  PVNS is a consideration.  Meniscus tear is unlikely - same with gout.  Other inflammatory arthritis is another possibility.  Discussed options - she would like to try prednisone dose pack, norco as needed instead of trial of intraarticular injection.  Call us if she would like to try injection otherwise follow up on or after 5/11 when she is 6 weeks out.

## 2015-10-17 ENCOUNTER — Ambulatory Visit (INDEPENDENT_AMBULATORY_CARE_PROVIDER_SITE_OTHER): Payer: Medicaid Other | Admitting: Family Medicine

## 2015-10-17 ENCOUNTER — Encounter: Payer: Self-pay | Admitting: Family Medicine

## 2015-10-17 ENCOUNTER — Encounter (HOSPITAL_BASED_OUTPATIENT_CLINIC_OR_DEPARTMENT_OTHER): Payer: Self-pay | Admitting: *Deleted

## 2015-10-17 VITALS — BP 117/79 | HR 73 | Ht 61.0 in | Wt 154.0 lb

## 2015-10-17 DIAGNOSIS — Z87891 Personal history of nicotine dependence: Secondary | ICD-10-CM | POA: Diagnosis not present

## 2015-10-17 DIAGNOSIS — M25461 Effusion, right knee: Secondary | ICD-10-CM

## 2015-10-17 DIAGNOSIS — J45909 Unspecified asthma, uncomplicated: Secondary | ICD-10-CM | POA: Diagnosis not present

## 2015-10-17 DIAGNOSIS — M25561 Pain in right knee: Secondary | ICD-10-CM | POA: Diagnosis not present

## 2015-10-17 MED ORDER — METHYLPREDNISOLONE ACETATE 40 MG/ML IJ SUSP
40.0000 mg | Freq: Once | INTRAMUSCULAR | Status: AC
  Administered 2015-10-17: 40 mg via INTRA_ARTICULAR

## 2015-10-17 NOTE — Assessment & Plan Note (Signed)
Patient has a lack of injury, no effusion again today though reports a lot of swelling.  MRI ordered was denied because patient has not done at least 6 weeks of conservative treatment.  Independently reviewed radiographs - nothing to account for her pain (has osteochondroma proximal tibia).  PVNS is a consideration.  Meniscus tear is unlikely - same with gout.  Other inflammatory arthritis is another possibility.  Intraarticular injection given today.  She can continue oxycodone her PCP prescribes.  F/u on or after 5/11 when she has completed 6 weeks of conservative treatment.  Consider MRI if not improving.  After informed written consent, patient was seated on exam table. Right knee was prepped with alcohol swab and utilizing anteromedial approach, patient's right knee was injected intraarticularly with 3:1 marcaine: depomedrol. Patient tolerated the procedure well without immediate complications.

## 2015-10-17 NOTE — ED Notes (Signed)
Pt c/o right pain x 3 month, seen By Hudnell MD today and received and injection c/o increased pain

## 2015-10-17 NOTE — Progress Notes (Signed)
PCP: Ricke Hey, MD  Subjective:   HPI: Patient is a 25 y.o. female here for right knee pain.  3/30: Knee Pain R, initial visit 09/27/15 - patient presents today for ongoing right knee pain and subjective swelling. This is been ongoing now for 3 weeks with no inciting injury. She has never had a previous right knee injury before. She does describe occasionally having other joints that swell and are painful. She has been worked up for this in the past. She is seen in the emergency room on 3/17 where a x-ray was done of the right knee which showed a proximal tibial osteochondroma but was otherwise negative. She denies any fevers, chills, sweats, redness in the area, skin infections, GI problems or thigh issues including diplopia, dryness, blurred vision. In the emergency room they did give her anti-inflammatories which she's been taking off and on. She also has been on crutches and a knee sleeve which has helped somewhat. Of note she was treated in February for presumptive PID and her GC/Chlamydia were both negative  4/17: Patient reports she has continued to have 9/10 level pain in right knee. Pain is severe, sharp, radiates up to hip. Described as a stabbing pain. Doing home exercises and icing as directed. No skin changes, fever. No catching, locking, giving out. Knee sleeve helped but fell apart.  4/19: Patient reports pain is 10/10 level, sharp anterior knee. She would like to go ahead with injection. Hydrocodone caused nausea. No skin changes, numbness.  Past Medical History  Diagnosis Date  . Asthma   . Herpes simplex of female genitalia     last outbreak 4 months ago  . Migraines   . Headache in pregnancy   . Migraine     Current Outpatient Prescriptions on File Prior to Visit  Medication Sig Dispense Refill  . acetaminophen (TYLENOL) 500 MG tablet Take 1,000-1,500 mg by mouth every 6 (six) hours as needed for moderate pain.     Marland Kitchen albuterol (PROVENTIL HFA;VENTOLIN  HFA) 108 (90 BASE) MCG/ACT inhaler Inhale 2 puffs into the lungs every 6 (six) hours as needed for wheezing or shortness of breath.    Marland Kitchen HYDROcodone-acetaminophen (NORCO) 5-325 MG tablet Take 1 tablet by mouth every 6 (six) hours as needed for moderate pain. 40 tablet 0  . predniSONE (DELTASONE) 10 MG tablet 6 tabs po day 1, 5 tabs po day 2, 4 tabs po day 3, 3 tabs po day 4, 2 tabs po day 5, 1 tab po day 6 21 tablet 0   No current facility-administered medications on file prior to visit.    Past Surgical History  Procedure Laterality Date  . Colposcopy  03/2014  . Cholecystectomy N/A 01/17/2015    Procedure: LAPAROSCOPIC CHOLECYSTECTOMY WITH INTRAOPERATIVE CHOLANGIOGRAM;  Surgeon: Excell Seltzer, MD;  Location: [facility];  Service: General;  Laterality: N/A;    Allergies  Allergen Reactions  . Shellfish Allergy Shortness Of Breath and Swelling    Tongue  . Other     Tylenol #3---Swelling   . Penicillins Itching, Swelling and Rash    Sweating, Has patient had a PCN reaction causing immediate rash, facial/tongue/throat swelling, SOB or lightheadedness with hypotension: NO Has patient had a PCN reaction causing severe rash involving mucus membranes or skin necrosis:NO Has patient had a PCN reaction that required hospitalization NO Has patient had a PCN reaction occurring within the last 10 years:NO If all of the above answers are "NO", then may proceed with Cephalosporin use.  Social History   Social History  . Marital Status: Single    Spouse Name: N/A  . Number of Children: 2  . Years of Education: 8 th   Occupational History  .  Unemployed   Social History Main Topics  . Smoking status: Former Smoker -- 0.25 packs/day for 2 years    Types: Cigarettes    Quit date: 05/02/2013  . Smokeless tobacco: Never Used  . Alcohol Use: No     Comment: socially  . Drug Use: No  . Sexual Activity: Yes    Birth Control/ Protection: None   Other Topics Concern  . Not on file    Social History Narrative   Patient is single and lives at home with her family.   Patient is unemployed.   Education 8 th grade.   Right handed.   Caffeine one cup of coffee daily.    Family History  Problem Relation Age of Onset  . Asthma Mother   . Asthma Sister   . Asthma Sister     There were no vitals taken for this visit.  Review of Systems: See HPI above.    Objective:  Physical Exam:  Gen: NAD, comfortable in exam room  Right knee: No gross deformity, ecchymoses, effusion. Diffuse anterior tenderness. FROM. NV intact distally.    Left knee: FROM without pain.  MSK u/s confirms lack of effusion.  Assessment & Plan:  1. Right knee pain - Patient has a lack of injury, no effusion again today though reports a lot of swelling.  MRI ordered was denied because patient has not done at least 6 weeks of conservative treatment.  Independently reviewed radiographs - nothing to account for her pain (has osteochondroma proximal tibia).  PVNS is a consideration.  Meniscus tear is unlikely - same with gout.  Other inflammatory arthritis is another possibility.  Intraarticular injection given today.  She can continue oxycodone her PCP prescribes.  F/u on or after 5/11 when she has completed 6 weeks of conservative treatment.  Consider MRI if not improving.  After informed written consent, patient was seated on exam table. Right knee was prepped with alcohol swab and utilizing anteromedial approach, patient's right knee was injected intraarticularly with 3:1 marcaine: depomedrol. Patient tolerated the procedure well without immediate complications.

## 2015-10-18 ENCOUNTER — Emergency Department (HOSPITAL_BASED_OUTPATIENT_CLINIC_OR_DEPARTMENT_OTHER)
Admission: EM | Admit: 2015-10-18 | Discharge: 2015-10-18 | Disposition: A | Discharge status: Home / Self Care | Payer: Medicaid Other | Attending: Emergency Medicine | Admitting: Emergency Medicine

## 2015-10-18 ENCOUNTER — Telehealth: Payer: Self-pay | Admitting: Family Medicine

## 2015-10-18 DIAGNOSIS — M25561 Pain in right knee: Secondary | ICD-10-CM

## 2015-10-18 MED ORDER — OXYCODONE-ACETAMINOPHEN 5-325 MG PO TABS
2.0000 | ORAL_TABLET | Freq: Once | ORAL | Status: AC
  Administered 2015-10-18: 2 via ORAL
  Filled 2015-10-18: qty 2

## 2015-10-18 MED ORDER — KETOROLAC TROMETHAMINE 30 MG/ML IJ SOLN
30.0000 mg | Freq: Once | INTRAMUSCULAR | Status: AC
  Administered 2015-10-18: 30 mg via INTRAMUSCULAR
  Filled 2015-10-18: qty 1

## 2015-10-18 MED ORDER — ONDANSETRON 8 MG PO TBDP
8.0000 mg | ORAL_TABLET | Freq: Once | ORAL | Status: AC
  Administered 2015-10-18: 8 mg via ORAL
  Filled 2015-10-18: qty 1

## 2015-10-18 NOTE — Telephone Encounter (Signed)
There's nothing to do now.  It can take a few days for the cortisone to kick in.  She can continue the medicines that we discussed previously.

## 2015-10-18 NOTE — ED Notes (Signed)
MD at bedside. 

## 2015-10-18 NOTE — Telephone Encounter (Signed)
Spoke to patient and told her that it takes a few days for the cortisone to kick in and to continue the medicines that were discussed previously.

## 2015-10-18 NOTE — ED Provider Notes (Signed)
CSN: [id number]     Arrival date & time 10/17/15  2327 History   None    Chief Complaint  Patient presents with  . Knee Pain     (Consider location/radiation/quality/duration/timing/severity/associated sxs/prior Treatment) HPI  This is a 25 year old female with a history of chronic right knee pain. She was seen by Dr. Barbaraann Barthel of sports medicine yesterday who injected her knee with a steroid. She has subsequently developed severe pain in the right knee to the point that she is unable to bend her knee. She has been ambulating with crutches. She describes the pain as a deep pain, worse with movement, palpation or attempted weightbearing.  The patient was not forthcoming about her chronic Percocet use. She has been receiving 90 tablets a month from Dr. Alyson Ingles since July of last year.   Past Medical History  Diagnosis Date  . Asthma   . Herpes simplex of female genitalia     last outbreak 4 months ago  . Migraines   . Headache in pregnancy   . Migraine    Past Surgical History  Procedure Laterality Date  . Colposcopy  03/2014  . Cholecystectomy N/A 01/17/2015    Procedure: LAPAROSCOPIC CHOLECYSTECTOMY WITH INTRAOPERATIVE CHOLANGIOGRAM;  Surgeon: Excell Seltzer, MD;  Location: [facility];  Service: General;  Laterality: N/A;   Family History  Problem Relation Age of Onset  . Asthma Mother   . Asthma Sister   . Asthma Sister    Social History  Substance Use Topics  . Smoking status: Former Smoker -- 0.25 packs/day for 2 years    Types: Cigarettes    Quit date: 05/02/2013  . Smokeless tobacco: Never Used  . Alcohol Use: No     Comment: socially   OB History    Gravida Para Term Preterm AB TAB SAB Ectopic Multiple Living   3 3 3  0 0 0 0 0 0 3     Review of Systems  All other systems reviewed and are negative.   Allergies  Shellfish allergy; Other; and Penicillins  Home Medications   Prior to Admission medications   Medication Sig Start Date End Date Taking?  Authorizing Provider  acetaminophen (TYLENOL) 500 MG tablet Take 1,000-1,500 mg by mouth every 6 (six) hours as needed for moderate pain.     Historical Provider, MD  albuterol (PROVENTIL HFA;VENTOLIN HFA) 108 (90 BASE) MCG/ACT inhaler Inhale 2 puffs into the lungs every 6 (six) hours as needed for wheezing or shortness of breath.    Historical Provider, MD  HYDROcodone-acetaminophen (NORCO) 5-325 MG tablet Take 1 tablet by mouth every 6 (six) hours as needed for moderate pain. 10/15/15   Dene Gentry, MD  predniSONE (DELTASONE) 10 MG tablet 6 tabs po day 1, 5 tabs po day 2, 4 tabs po day 3, 3 tabs po day 4, 2 tabs po day 5, 1 tab po day 6 10/15/15   Dene Gentry, MD   BP 124/69 mmHg  Pulse 81  Temp(Src) 98.3 F (36.8 C) (Oral)  Resp 16  Wt 154 lb (69.854 kg)  SpO2 100%  LMP 09/12/2015   Physical Exam  General: Well-developed, well-nourished female in no acute distress; appearance consistent with age of record HENT: normocephalic; atraumatic Eyes: pupils equal, round and reactive to light; extraocular muscles intact Neck: supple Heart: regular rate and rhythm Lungs: clear to auscultation bilaterally Abdomen: soft; nondistended; nontender; bowel sounds present Extremities: No deformity; full range of motion except right knee very limited due to pain; pulses  normal; tenderness on light palpation of right knee with small puncture wound consistent with recent intra-articular injection Neurologic: Awake, alert and oriented; motor function intact in all extremities and symmetric; no facial droop Skin: Warm and dry Psychiatric: Normal mood and affect    ED Course  Procedures (including critical care time)   MDM     Shanon Rosser, MD 10/18/15 0225

## 2015-10-19 ENCOUNTER — Telehealth: Payer: Self-pay | Admitting: Family Medicine

## 2015-10-22 NOTE — Telephone Encounter (Signed)
As we discussed she can take the oxycodone her PCP prescribes, NSAID like aleve 2 tabs twice a day OR ibuprofen 600mg  three times a day with food, topical medication like capsaicin 4 times a day.  If she has a fever above 100.4, redness then we should see her otherwise as we stated there is not much we can do until she has done 6 weeks of conservative treatment.

## 2015-10-23 ENCOUNTER — Encounter: Payer: Self-pay | Admitting: Family Medicine

## 2015-10-23 ENCOUNTER — Ambulatory Visit (INDEPENDENT_AMBULATORY_CARE_PROVIDER_SITE_OTHER): Payer: Medicaid Other | Admitting: Family Medicine

## 2015-10-23 VITALS — BP 116/80 | HR 77 | Temp 98.2°F | Ht 61.0 in | Wt 154.0 lb

## 2015-10-23 DIAGNOSIS — M25561 Pain in right knee: Secondary | ICD-10-CM | POA: Diagnosis not present

## 2015-10-23 MED ORDER — MELOXICAM 15 MG PO TABS: 15.0000 mg | ORAL_TABLET | Freq: Every day | ORAL | Status: DC

## 2015-10-23 NOTE — Patient Instructions (Signed)
Try meloxicam 15mg  daily with food for pain and inflammation. Contact us on May 11 if still not improving and that's when we could get approval for an MRI.

## 2015-10-23 NOTE — Telephone Encounter (Signed)
She is coming in today to reevaluate.

## 2015-10-24 NOTE — Assessment & Plan Note (Signed)
Patient has a lack of injury, no effusion again and no discoloration though she reports she has these currently and they have been more severe than at present.  No fever and no evidence of infection following last week's injection.  She can try meloxicam daily.  I am concerned about nonanatomic nature of pain either in totality or exaggerating her pain.  We again discussed fever, redness to call us if she has these.  Otherwise follow up on or after 5/11 - if not improving can consider MRI of this knee.

## 2015-10-24 NOTE — Progress Notes (Signed)
PCP: Ricke Hey, MD  Subjective:   HPI: Patient is a 25 y.o. female here for right knee pain.  3/30: Knee Pain R, initial visit 09/27/15 - patient presents today for ongoing right knee pain and subjective swelling. This is been ongoing now for 3 weeks with no inciting injury. She has never had a previous right knee injury before. She does describe occasionally having other joints that swell and are painful. She has been worked up for this in the past. She is seen in the emergency room on 3/17 where a x-ray was done of the right knee which showed a proximal tibial osteochondroma but was otherwise negative. She denies any fevers, chills, sweats, redness in the area, skin infections, GI problems or thigh issues including diplopia, dryness, blurred vision. In the emergency room they did give her anti-inflammatories which she's been taking off and on. She also has been on crutches and a knee sleeve which has helped somewhat. Of note she was treated in February for presumptive PID and her GC/Chlamydia were both negative  4/17: Patient reports she has continued to have 9/10 level pain in right knee. Pain is severe, sharp, radiates up to hip. Described as a stabbing pain. Doing home exercises and icing as directed. No skin changes, fever. No catching, locking, giving out. Knee sleeve helped but fell apart.  4/19: Patient reports pain is 10/10 level, sharp anterior knee. She would like to go ahead with injection. Hydrocodone caused nausea. No skin changes, numbness.  4/25: Patient returns with complaints of pain radiating to right hip from the knee. Pain level 8/10. Not noticed benefit with injection. Describes purple coloration of entire leg but left side appears the same. Also reporting severe swelling though none has been noted on repeat evaluations. No numbness.  Past Medical History  Diagnosis Date  . Asthma   . Herpes simplex of female genitalia     last outbreak 4 months ago   . Migraines   . Headache in pregnancy   . Migraine     Current Outpatient Prescriptions on File Prior to Visit  Medication Sig Dispense Refill  . acetaminophen (TYLENOL) 500 MG tablet Take 1,000-1,500 mg by mouth every 6 (six) hours as needed for moderate pain.     Marland Kitchen albuterol (PROVENTIL HFA;VENTOLIN HFA) 108 (90 BASE) MCG/ACT inhaler Inhale 2 puffs into the lungs every 6 (six) hours as needed for wheezing or shortness of breath.     No current facility-administered medications on file prior to visit.    Past Surgical History  Procedure Laterality Date  . Colposcopy  03/2014  . Cholecystectomy N/A 01/17/2015    Procedure: LAPAROSCOPIC CHOLECYSTECTOMY WITH INTRAOPERATIVE CHOLANGIOGRAM;  Surgeon: Excell Seltzer, MD;  Location: [facility];  Service: General;  Laterality: N/A;    Allergies  Allergen Reactions  . Shellfish Allergy Shortness Of Breath and Swelling    Tongue  . Other     Tylenol #3---Swelling   . Penicillins Itching, Swelling and Rash    Sweating, Has patient had a PCN reaction causing immediate rash, facial/tongue/throat swelling, SOB or lightheadedness with hypotension: NO Has patient had a PCN reaction causing severe rash involving mucus membranes or skin necrosis:NO Has patient had a PCN reaction that required hospitalization NO Has patient had a PCN reaction occurring within the last 10 years:NO If all of the above answers are "NO", then may proceed with Cephalosporin use.      Social History   Social History  . Marital Status: Single  Spouse Name: N/A  . Number of Children: 2  . Years of Education: 8 th   Occupational History  .  Unemployed   Social History Main Topics  . Smoking status: Former Smoker -- 0.25 packs/day for 2 years    Types: Cigarettes    Quit date: 05/02/2013  . Smokeless tobacco: Never Used  . Alcohol Use: No     Comment: socially  . Drug Use: No  . Sexual Activity: Yes    Birth Control/ Protection: None   Other Topics  Concern  . Not on file   Social History Narrative   Patient is single and lives at home with her family.   Patient is unemployed.   Education 8 th grade.   Right handed.   Caffeine one cup of coffee daily.    Family History  Problem Relation Age of Onset  . Asthma Mother   . Asthma Sister   . Asthma Sister     BP 116/80 mmHg  Pulse 77  Temp(Src) 98.2 F (36.8 C) (Oral)  Ht 5\' 1"  (1.549 m)  Wt 154 lb (69.854 kg)  BMI 29.11 kg/m2  LMP 09/12/2015  Review of Systems: See HPI above.    Objective:  Physical Exam:  Gen: NAD, comfortable in exam room  Right knee: No gross deformity, ecchymoses, effusion.  Color of legs the same bilaterally. Diffuse anterior tenderness. FROM. NV intact distally. dp and pt pulses 2+.  Cap refill < 2 sec. Negative logroll of hip.  Left knee: FROM without pain.  Assessment & Plan:  1. Right knee pain - Patient has a lack of injury, no effusion again and no discoloration though she reports she has these currently and they have been more severe than at present.  No fever and no evidence of infection following last week's injection.  She can try meloxicam daily.  I am concerned about nonanatomic nature of pain either in totality or exaggerating her pain.  We again discussed fever, redness to call us if she has these.  Otherwise follow up on or after 5/11 - if not improving can consider MRI of this knee.

## 2015-10-30 ENCOUNTER — Ambulatory Visit: Payer: Medicaid Other | Attending: Physical Therapy | Admitting: Physical Therapy

## 2015-11-07 ENCOUNTER — Telehealth: Payer: Self-pay | Admitting: *Deleted

## 2015-11-07 NOTE — Telephone Encounter (Signed)
Patient called and wanted to do a MRI. Called to get prior authorization. Called patient to let them know that as soon as we get a response, would contact patient.

## 2015-11-12 ENCOUNTER — Telehealth: Payer: Self-pay | Admitting: Family Medicine

## 2015-11-12 NOTE — Addendum Note (Signed)
Addended by: Sherrie George F on: 11/12/2015 10:31 AM   Modules accepted: Orders

## 2015-11-12 NOTE — Telephone Encounter (Signed)
MRI is in review following prior authorization.

## 2015-11-13 NOTE — Telephone Encounter (Signed)
Appointment set

## 2015-11-13 NOTE — Telephone Encounter (Signed)
Spoke to patient on 11-13-15 and gave patient number to radiology to set up appointment.

## 2015-11-17 ENCOUNTER — Ambulatory Visit (HOSPITAL_BASED_OUTPATIENT_CLINIC_OR_DEPARTMENT_OTHER)
Admission: RE | Admit: 2015-11-17 | Discharge: 2015-11-17 | Disposition: A | Discharge status: Home / Self Care | Payer: Medicaid Other | Source: Ambulatory Visit | Attending: Family Medicine | Admitting: Family Medicine

## 2015-11-17 DIAGNOSIS — D169 Benign neoplasm of bone and articular cartilage, unspecified: Secondary | ICD-10-CM | POA: Diagnosis not present

## 2015-11-17 DIAGNOSIS — X58XXXA Exposure to other specified factors, initial encounter: Secondary | ICD-10-CM | POA: Insufficient documentation

## 2015-11-17 DIAGNOSIS — M25461 Effusion, right knee: Secondary | ICD-10-CM | POA: Diagnosis not present

## 2015-11-17 DIAGNOSIS — S83241A Other tear of medial meniscus, current injury, right knee, initial encounter: Secondary | ICD-10-CM | POA: Diagnosis not present

## 2015-11-17 DIAGNOSIS — M25561 Pain in right knee: Secondary | ICD-10-CM | POA: Insufficient documentation

## 2015-11-19 ENCOUNTER — Telehealth: Payer: Self-pay | Admitting: Family Medicine

## 2015-11-20 NOTE — Telephone Encounter (Signed)
I called yesterday at 445 pm and left message at the number provided.

## 2015-11-20 NOTE — Telephone Encounter (Signed)
Spoke with patient re: MRI results.  Please refer patient to orthopedics to discuss arthroscopy for meniscus tear not improving with conservative measures.  Thanks!

## 2015-11-20 NOTE — Telephone Encounter (Signed)
Called patient at the number listed - no answer and voicemail not set up.

## 2015-11-27 ENCOUNTER — Telehealth: Payer: Self-pay | Admitting: Family Medicine

## 2015-11-27 NOTE — Telephone Encounter (Signed)
He is not a Psychologist, sport and exercise.  Please let her know.

## 2015-11-27 NOTE — Telephone Encounter (Signed)
Unable to contact patient. Could not leave message. Will try again.

## 2015-12-03 ENCOUNTER — Telehealth: Payer: Self-pay | Admitting: Family Medicine

## 2015-12-03 NOTE — Telephone Encounter (Signed)
Spoke to patient and will set her up with [facility] [employer].

## 2015-12-05 ENCOUNTER — Other Ambulatory Visit: Payer: Self-pay | Admitting: *Deleted

## 2015-12-05 DIAGNOSIS — M25561 Pain in right knee: Secondary | ICD-10-CM

## 2016-03-05 ENCOUNTER — Encounter (HOSPITAL_COMMUNITY): Payer: Self-pay | Admitting: Emergency Medicine

## 2016-03-05 ENCOUNTER — Emergency Department (HOSPITAL_COMMUNITY)
Admission: EM | Admit: 2016-03-05 | Discharge: 2016-03-05 | Disposition: A | Discharge status: Home / Self Care | Payer: Medicaid Other | Attending: Physician Assistant | Admitting: Physician Assistant

## 2016-03-05 DIAGNOSIS — R51 Headache: Secondary | ICD-10-CM | POA: Diagnosis present

## 2016-03-05 DIAGNOSIS — Z87891 Personal history of nicotine dependence: Secondary | ICD-10-CM | POA: Diagnosis not present

## 2016-03-05 DIAGNOSIS — J45909 Unspecified asthma, uncomplicated: Secondary | ICD-10-CM | POA: Diagnosis not present

## 2016-03-05 DIAGNOSIS — J0110 Acute frontal sinusitis, unspecified: Secondary | ICD-10-CM | POA: Diagnosis not present

## 2016-03-05 MED ORDER — DOXYCYCLINE HYCLATE 100 MG PO CAPS: 100.0000 mg | ORAL_CAPSULE | Freq: Two times a day (BID) | ORAL | 0 refills | Status: DC

## 2016-03-05 NOTE — ED Triage Notes (Signed)
Pt sts left sided facial pain x 2 weeks and pain in left ear

## 2016-03-05 NOTE — ED Provider Notes (Signed)
[facility] DEPT Provider Note   CSN: [id number] Arrival date & time: 03/05/16  [id number]    By signing my name below, I, Sonum Patel, attest that this documentation has been prepared under the direction and in the presence of Glendell Docker, NP. Electronically Signed: Sonum Patel, Education administrator. 03/05/16. 10:20 AM.  History   Chief Complaint Chief Complaint  Patient presents with  . Facial Pain    The history is provided by the patient. No language interpreter was used.     HPI Comments: Katrina [name] is a 25 y.o. female who presents to the Emergency Department complaining of constant, gradually worsened left sided facial pain and left sided ear pain that began a few days ago. She has associated mild paresthesias to the left face. She takes daily pain medication but did not specify why she takes it. She states her LNMP was 2 weeks ago .    Past Medical History:  Diagnosis Date  . Asthma   . Headache in pregnancy   . Herpes simplex of female genitalia    last outbreak 4 months ago  . Migraine   . Migraines     Patient Active Problem List   Diagnosis Date Noted  . Right knee pain 09/27/2015  . Biliary colic Q000111Q  . Recurrent biliary colic Q000111Q  . Cholestasis of pregnancy 01/03/2015  . [redacted] weeks gestation of pregnancy   . Gestational hypertension   . Pregnancy 12/19/2014  . Migraine without aura and without status migrainosus, not intractable 08/21/2014  . Pregnancy complication [id number]  . Low-lying placenta   . Preterm premature rupture of membranes with onset of labor more than 24 hours following rupture in second trimester   . Vaginal bleeding in pregnancy   . [redacted] weeks gestation of pregnancy   . Amniotic fluid leaking   . Pregnant 05/26/2014  . Smoker 05/26/2014  . Breast lump on right side at 10 o'clock position 03/21/2014  . 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 CHOLECYSTECTOMY N/A 01/17/2015   Procedure: LAPAROSCOPIC CHOLECYSTECTOMY WITH INTRAOPERATIVE CHOLANGIOGRAM;  Surgeon: Excell Seltzer, MD;  Location: [facility];  Service: General;  Laterality: N/A;  . COLPOSCOPY  03/2014    OB History    Gravida Para Term Preterm AB Living   3 3 3  0 0 3   SAB TAB Ectopic Multiple Live Births   0 0 0 0 3       Home Medications    Prior to Admission medications   Medication Sig Start Date End Date Taking? Authorizing Provider  acetaminophen (TYLENOL) 500 MG tablet Take 1,000-1,500 mg by mouth every 6 (six) hours as needed for moderate pain.     Historical Provider, MD  albuterol (PROVENTIL HFA;VENTOLIN HFA) 108 (90 BASE) MCG/ACT inhaler Inhale 2 puffs into the lungs every 6 (six) hours as needed for wheezing or shortness of breath.    Historical Provider, MD  meloxicam (MOBIC) 15 MG tablet Take 1 tablet (15 mg total) by mouth daily. 10/23/15   Dene Gentry, MD    Family History Family History  Problem Relation Age of Onset  . Asthma Mother   . Asthma Sister   . Asthma Sister     Social History Social History  Substance Use Topics  . Smoking status: Former Smoker    Packs/day: 0.25    Years: 2.00  Types: Cigarettes    Quit date: 05/02/2013  . Smokeless tobacco: Never Used  . Alcohol use No     Comment: socially     Allergies   Shellfish allergy; Other; and Penicillins   Review of Systems Review of Systems  Constitutional: Negative for fever.  HENT: Positive for ear pain.        +facial pain  All other systems reviewed and are negative.    Physical Exam Updated Vital Signs BP 127/81 (BP Location: Right Arm)   Pulse 71   Temp 98.4 F (36.9 C) (Oral)   Resp 18   SpO2 99%   Physical Exam  Constitutional: She is oriented to person, place, and time. She appears well-developed and well-nourished. No distress.  HENT:  Head: Normocephalic and  atraumatic.  Right Ear: External ear normal.  Left Ear: External ear normal.  Mouth/Throat: Posterior oropharyngeal erythema present.  Eyes: Conjunctivae and EOM are normal.  Neck: Neck supple. No tracheal deviation present.  Cardiovascular: Normal rate.   Pulmonary/Chest: Effort normal. No respiratory distress.  Musculoskeletal: Normal range of motion.  Neurological: She is alert and oriented to person, place, and time.  Skin: Skin is warm and dry.  Psychiatric: She has a normal mood and affect. Her behavior is normal.  Nursing note and vitals reviewed.    ED Treatments / Results  DIAGNOSTIC STUDIES: Oxygen Saturation is 99% on RA, normal by my interpretation.    COORDINATION OF CARE: 10:25 AM Discussed treatment plan with pt at bedside and pt agreed to plan.   Labs (all labs ordered are listed, but only abnormal results are displayed) Labs Reviewed - No data to display  EKG  EKG Interpretation None       Radiology No results found.  Procedures Procedures (including critical care time)  Medications Ordered in ED Medications - No data to display   Initial Impression / Assessment and Plan / ED Course  I have reviewed the triage vital signs and the nursing notes.  Pertinent labs & imaging results that were available during my care of the patient were reviewed by me and considered in my medical decision making (see chart for details).  Clinical Course    Likely sinusitis. Will treat with antibiotics. Pt has pcn allergy  Final Clinical Impressions(s) / ED Diagnoses   Final diagnoses:  None    New Prescriptions New Prescriptions   No medications on file  I personally performed the services described in this documentation, which was scribed in my presence. The recorded information has been reviewed and is accurate.    Glendell Docker, NP 03/05/16 [place], MD 03/07/16 1029

## 2016-03-13 ENCOUNTER — Emergency Department (HOSPITAL_COMMUNITY)
Admission: EM | Admit: 2016-03-13 | Discharge: 2016-03-13 | Disposition: A | Discharge status: Home / Self Care | Payer: Medicaid Other | Attending: Emergency Medicine | Admitting: Emergency Medicine

## 2016-03-13 ENCOUNTER — Encounter (HOSPITAL_COMMUNITY): Payer: Self-pay | Admitting: *Deleted

## 2016-03-13 ENCOUNTER — Ambulatory Visit (HOSPITAL_COMMUNITY)
Admission: EM | Admit: 2016-03-13 | Discharge: 2016-03-13 | Discharge status: Against Medical Advice | Payer: Medicaid Other

## 2016-03-13 DIAGNOSIS — R1011 Right upper quadrant pain: Secondary | ICD-10-CM | POA: Insufficient documentation

## 2016-03-13 DIAGNOSIS — J45909 Unspecified asthma, uncomplicated: Secondary | ICD-10-CM | POA: Diagnosis not present

## 2016-03-13 DIAGNOSIS — Z5321 Procedure and treatment not carried out due to patient leaving prior to being seen by health care provider: Secondary | ICD-10-CM | POA: Insufficient documentation

## 2016-03-13 DIAGNOSIS — Z87891 Personal history of nicotine dependence: Secondary | ICD-10-CM | POA: Diagnosis not present

## 2016-03-13 LAB — COMPREHENSIVE METABOLIC PANEL WITH GFR
ALT: 35 U/L (ref 14–54)
AST: 22 U/L (ref 15–41)
Albumin: 4.2 g/dL (ref 3.5–5.0)
Alkaline Phosphatase: 80 U/L (ref 38–126)
Anion gap: 7 (ref 5–15)
BUN: 10 mg/dL (ref 6–20)
CO2: 23 mmol/L (ref 22–32)
Calcium: 9.2 mg/dL (ref 8.9–10.3)
Chloride: 107 mmol/L (ref 101–111)
Creatinine, Ser: 0.63 mg/dL (ref 0.44–1.00)
GFR calc Af Amer: >60 mL/min
GFR calc non Af Amer: >60 mL/min
Glucose, Bld: 117 mg/dL — ABNORMAL HIGH (ref 65–99)
Potassium: 3.9 mmol/L (ref 3.5–5.1)
Sodium: 137 mmol/L (ref 135–145)
Total Bilirubin: 1 mg/dL (ref 0.3–1.2)
Total Protein: 7.8 g/dL (ref 6.5–8.1)

## 2016-03-13 LAB — CBC
HCT: 40.9 % (ref 36.0–46.0)
Hemoglobin: 13.9 g/dL (ref 12.0–15.0)
MCH: 31.3 pg (ref 26.0–34.0)
MCHC: 34 g/dL (ref 30.0–36.0)
MCV: 92.1 fL (ref 78.0–100.0)
Platelets: 352 10*3/uL (ref 150–400)
RBC: 4.44 MIL/uL (ref 3.87–5.11)
RDW: 12.8 % (ref 11.5–15.5)
WBC: 11.9 10*3/uL — ABNORMAL HIGH (ref 4.0–10.5)

## 2016-03-13 LAB — LIPASE, BLOOD: Lipase: 23 U/L (ref 11–51)

## 2016-03-13 LAB — HCG, QUANTITATIVE, PREGNANCY: hCG, Beta Chain, Quant, S: 1 m[IU]/mL

## 2016-03-13 MED ORDER — ONDANSETRON 4 MG PO TBDP
ORAL_TABLET | ORAL | Status: AC
  Filled 2016-03-13: qty 1

## 2016-03-13 MED ORDER — ONDANSETRON 4 MG PO TBDP: 8.0000 mg | ORAL_TABLET | Freq: Once | ORAL | Status: DC

## 2016-03-13 MED ORDER — ONDANSETRON 4 MG PO TBDP
4.0000 mg | ORAL_TABLET | Freq: Once | ORAL | Status: AC
  Administered 2016-03-13: 4 mg via ORAL

## 2016-03-13 NOTE — ED Notes (Signed)
Pt  Was  Noted  By  [employer]  Staff   Getting  In a  truck  And  Driving  Away

## 2016-03-13 NOTE — ED Notes (Signed)
Pt comes to the desk and states " Im leaving, I do not have time to wait." pt encouraged to stay and see MD.

## 2016-03-13 NOTE — ED Triage Notes (Signed)
Pt states RUQ pain for several with diarrhea and vomiting.  States she feels "hot" but does not know if she's had fevers.

## 2016-07-26 ENCOUNTER — Emergency Department (HOSPITAL_COMMUNITY)
Admission: EM | Admit: 2016-07-26 | Discharge: 2016-07-26 | Disposition: A | Discharge status: Home / Self Care | Payer: Medicaid Other | Attending: Emergency Medicine | Admitting: Emergency Medicine

## 2016-07-26 DIAGNOSIS — Z87891 Personal history of nicotine dependence: Secondary | ICD-10-CM | POA: Insufficient documentation

## 2016-07-26 DIAGNOSIS — J45909 Unspecified asthma, uncomplicated: Secondary | ICD-10-CM | POA: Diagnosis not present

## 2016-07-26 DIAGNOSIS — K921 Melena: Secondary | ICD-10-CM | POA: Diagnosis not present

## 2016-07-26 LAB — COMPREHENSIVE METABOLIC PANEL
ALT: 41 U/L (ref 14–54)
AST: 28 U/L (ref 15–41)
Albumin: 3.7 g/dL (ref 3.5–5.0)
Alkaline Phosphatase: 118 U/L (ref 38–126)
Anion gap: 8 (ref 5–15)
BILIRUBIN TOTAL: 0.3 mg/dL (ref 0.3–1.2)
BUN: 12 mg/dL (ref 6–20)
CO2: 26 mmol/L (ref 22–32)
CREATININE: 0.59 mg/dL (ref 0.44–1.00)
Calcium: 9.2 mg/dL (ref 8.9–10.3)
Chloride: 102 mmol/L (ref 101–111)
Glucose, Bld: 96 mg/dL (ref 65–99)
POTASSIUM: 4 mmol/L (ref 3.5–5.1)
Sodium: 136 mmol/L (ref 135–145)
TOTAL PROTEIN: 8.4 g/dL — AB (ref 6.5–8.1)

## 2016-07-26 LAB — CBC
HEMATOCRIT: 38.4 % (ref 36.0–46.0)
Hemoglobin: 13.5 g/dL (ref 12.0–15.0)
MCH: 31.3 pg (ref 26.0–34.0)
MCHC: 35.2 g/dL (ref 30.0–36.0)
MCV: 89.1 fL (ref 78.0–100.0)
PLATELETS: 329 10*3/uL (ref 150–400)
RBC: 4.31 MIL/uL (ref 3.87–5.11)
RDW: 12.8 % (ref 11.5–15.5)
WBC: 10.1 10*3/uL (ref 4.0–10.5)

## 2016-07-26 LAB — DIFFERENTIAL
BASOS PCT: 0 %
Basophils Absolute: 0 10*3/uL (ref 0.0–0.1)
EOS ABS: 0.4 10*3/uL (ref 0.0–0.7)
Eosinophils Relative: 4 %
Lymphocytes Relative: 23 %
Lymphs Abs: 2.3 10*3/uL (ref 0.7–4.0)
MONO ABS: 0.5 10*3/uL (ref 0.1–1.0)
Monocytes Relative: 5 %
Neutro Abs: 6.7 10*3/uL (ref 1.7–7.7)
Neutrophils Relative %: 68 %

## 2016-07-26 LAB — TYPE AND SCREEN
ABO/RH(D): O NEG
ANTIBODY SCREEN: NEGATIVE

## 2016-07-26 LAB — POC OCCULT BLOOD, ED: Fecal Occult Bld: POSITIVE — AB

## 2016-07-26 LAB — ABO/RH: ABO/RH(D): O NEG

## 2016-07-26 MED ORDER — CIPROFLOXACIN HCL 500 MG PO TABS
500.0000 mg | ORAL_TABLET | Freq: Once | ORAL | Status: AC
  Administered 2016-07-26: 500 mg via ORAL
  Filled 2016-07-26: qty 1

## 2016-07-26 MED ORDER — TRAMADOL HCL 50 MG PO TABS: 50.0000 mg | ORAL_TABLET | Freq: Four times a day (QID) | ORAL | 0 refills | Status: DC | PRN

## 2016-07-26 MED ORDER — CIPROFLOXACIN HCL 500 MG PO TABS: 500.0000 mg | ORAL_TABLET | Freq: Two times a day (BID) | ORAL | 0 refills | Status: DC

## 2016-07-26 MED ORDER — TRAMADOL HCL 50 MG PO TABS
50.0000 mg | ORAL_TABLET | Freq: Once | ORAL | Status: AC
  Administered 2016-07-26: 50 mg via ORAL
  Filled 2016-07-26: qty 1

## 2016-07-26 NOTE — Discharge Instructions (Signed)
Return if symptoms are getting worse. °

## 2016-07-26 NOTE — ED Provider Notes (Signed)
[facility] DEPT Provider Note   CSN: [id number] Arrival date & time: 07/26/16  0028  By signing my name below, I, Jeanell Sparrow, attest that this documentation has been prepared under the direction and in the presence of Delora Fuel, MD. Electronically Signed: Jeanell Sparrow, Scribe. 07/26/2016. 1:44 AM.  History   Chief Complaint Chief Complaint  Patient presents with  . Sore Throat  . Rectal Bleeding  . Abdominal Pain   The history is provided by the patient. No language interpreter was used.   HPI Comments: Katrina [name] is a 26 y.o. female who presents to the Emergency Department complaining of blood in stool that started about a week ago. She initially suspected a hemorrhoid, but could not locate one on personal examination. She describes her stool as bright red. She reports associated symptoms of LUQ abdominal pain, right ear pain (onset 3 weeks ago), sore throat, chills, and diaphoresis. Her abdominal pain is exacerbated by BM. She denies any fever, cough, nausea, vomiting, diarrhea, or other complaints.     PCP: Ricke Hey, MD  Past Medical History:  Diagnosis Date  . Asthma   . Headache in pregnancy   . Herpes simplex of female genitalia    last outbreak 4 months ago  . Migraine   . Migraines     Patient Active Problem List   Diagnosis Date Noted  . Right knee pain 09/27/2015  . Biliary colic Q000111Q  . Recurrent biliary colic Q000111Q  . Cholestasis of pregnancy 01/03/2015  . [redacted] weeks gestation of pregnancy   . Gestational hypertension   . Pregnancy 12/19/2014  . Migraine without aura and without status migrainosus, not intractable 08/21/2014  . Pregnancy complication [id number]  . Low-lying placenta   . Preterm premature rupture of membranes with onset of labor more than 24 hours following rupture in second trimester   . Vaginal bleeding in pregnancy   . [redacted] weeks gestation of pregnancy   . Amniotic fluid leaking   . Pregnant 05/26/2014  .  Smoker 05/26/2014  . Breast lump on right side at 10 o'clock position 03/21/2014  . 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 CHOLECYSTECTOMY N/A 01/17/2015   Procedure: LAPAROSCOPIC CHOLECYSTECTOMY WITH INTRAOPERATIVE CHOLANGIOGRAM;  Surgeon: Excell Seltzer, MD;  Location: [facility];  Service: General;  Laterality: N/A;  . COLPOSCOPY  03/2014    OB History    Gravida Para Term Preterm AB Living   3 3 3  0 0 3   SAB TAB Ectopic Multiple Live Births   0 0 0 0 3       Home Medications    Prior to Admission medications   Medication Sig Start Date End Date Taking? Authorizing Provider  acetaminophen (TYLENOL) 500 MG tablet Take 1,000-1,500 mg by mouth every 6 (six) hours as needed for moderate pain.     Historical Provider, MD  albuterol (PROVENTIL HFA;VENTOLIN HFA) 108 (90 BASE) MCG/ACT inhaler Inhale 2 puffs into the lungs every 6 (six) hours as needed for wheezing or shortness of breath.    Historical Provider, MD  doxycycline (VIBRAMYCIN) 100 MG capsule Take 1 capsule (100 mg total) by mouth 2 (two) times daily. 03/05/16   Glendell Docker, NP  meloxicam (MOBIC) 15 MG tablet Take 1 tablet (15 mg total) by mouth daily. 10/23/15   Dene Gentry, MD    Family History  Family History  Problem Relation Age of Onset  . Asthma Mother   . Asthma Sister   . Asthma Sister     Social History Social History  Substance Use Topics  . Smoking status: Former Smoker    Packs/day: 0.25    Years: 2.00    Types: Cigarettes    Quit date: 05/02/2013  . Smokeless tobacco: Never Used  . Alcohol use No     Comment: socially     Allergies   Shellfish allergy; Other; and Penicillins   Review of Systems Review of Systems  Constitutional: Positive for chills and diaphoresis. Negative for fever.  HENT: Positive for ear pain  (Right) and sore throat.   Respiratory: Negative for cough.   Gastrointestinal: Positive for abdominal pain (LUQ) and hematochezia. Negative for diarrhea, nausea and vomiting.  All other systems reviewed and are negative.    Physical Exam Updated Vital Signs BP 123/69 (BP Location: Right Arm)   Pulse 93   Temp 98.6 F (37 C) (Oral)   Resp 20   Ht 5\' 1"  (1.549 m)   Wt 150 lb (68 kg)   LMP 07/09/2016   SpO2 100%   BMI 28.34 kg/m   Physical Exam  Constitutional: She is oriented to person, place, and time. She appears well-developed and well-nourished.  HENT:  Head: Normocephalic and atraumatic.  Right Ear: Tympanic membrane normal.  Left Ear: Tympanic membrane normal.  TMs clear.   Eyes: EOM are normal. Pupils are equal, round, and reactive to light.  Neck: Normal range of motion. Neck supple. No JVD present.  Cardiovascular: Normal rate, regular rhythm and normal heart sounds.   No murmur heard. Pulmonary/Chest: Effort normal and breath sounds normal. She has no wheezes. She has no rales. She exhibits no tenderness.  Abdominal: Soft. Bowel sounds are normal. She exhibits no distension and no mass. There is no tenderness.  Genitourinary:  Genitourinary Comments: Rectal Exam: Anal stricture present. Small amount of bright red blood present.   Musculoskeletal: Normal range of motion. She exhibits no edema.  Lymphadenopathy:    She has no cervical adenopathy.  Neurological: She is alert and oriented to person, place, and time. No cranial nerve deficit. She exhibits normal muscle tone. Coordination normal.  Skin: Skin is warm and dry. No rash noted.  Psychiatric: She has a normal mood and affect. Her behavior is normal. Judgment and thought content normal.  Nursing note and vitals reviewed.    ED Treatments / Results  DIAGNOSTIC STUDIES: Oxygen Saturation is 100% on RA, normal by my interpretation.    COORDINATION OF CARE: 1:48 AM- Pt advised of plan for treatment and pt  agrees.  Labs (all labs ordered are listed, but only abnormal results are displayed) Labs Reviewed  COMPREHENSIVE METABOLIC PANEL - Abnormal; Notable for the following:       Result Value   Total Protein 8.4 (*)    All other components within normal limits  POC OCCULT BLOOD, ED - Abnormal; Notable for the following:    Fecal Occult Bld POSITIVE (*)    All other components within normal limits  CBC  DIFFERENTIAL  POC OCCULT BLOOD, ED  TYPE AND SCREEN  ABO/RH    EKG  EKG Interpretation None       Procedures Procedures (including critical care time)  Medications Ordered in ED Medications  traMADol (ULTRAM) tablet 50 mg (not administered)  ciprofloxacin (CIPRO) tablet 500 mg (not administered)     Initial Impression / Assessment and Plan /  ED Course  I have reviewed the triage vital signs and the nursing notes.  Pertinent labs & imaging results that were available during my care of the patient were reviewed by me and considered in my medical decision making (see chart for details).  Rectal bleeding of uncertain cause. There does appear to be an anal stricture, this does not seem to be the source of bleeding. Orthostatic vital signs showed no significant change in pulse or blood pressure. Hemoglobin is stable compared with baseline. Suspect low-grade colitis. She is discharged with prescription for ciprofloxacin for 3 days and is referred to gastroenterology for follow-up and also given prescription for small number of tramadol to use as needed for pain. Return precautions discussed.  Final Clinical Impressions(s) / ED Diagnoses   Final diagnoses:  Hematochezia    New Prescriptions New Prescriptions   CIPROFLOXACIN (CIPRO) 500 MG TABLET    Take 1 tablet (500 mg total) by mouth 2 (two) times daily.   TRAMADOL (ULTRAM) 50 MG TABLET    Take 1 tablet (50 mg total) by mouth every 6 (six) hours as needed.   I personally performed the services described in this  documentation, which was scribed in my presence. The recorded information has been reviewed and is accurate.       Delora Fuel, MD [id number]

## 2016-07-26 NOTE — ED Triage Notes (Signed)
Arrives with complaint of left ear pain with throat pain and yellow mucus; also complaining of RUQ and RLQ abdominal pain with associated rectal bleeding. States onset of both about 1 week ago. Denies history of similar abdominal issues, but had cholecystectomy in 2016. Denies history of ulcerative colitis. Denies fever. Reports that blood is bright red in appearance. Was seen for the ear and throat pain by PCP and prescribed decongestant without improvement.

## 2016-08-03 IMAGING — MR MR KNEE*R* W/O CM
6 series · 40 of 40 positions shown · non-contrast
Comparison: 09/14/2015

CLINICAL DATA: Lateral right knee pain above the patella for 4
months. Swelling and limited range of motion.

EXAM:
MRI OF THE RIGHT KNEE WITHOUT CONTRAST
TECHNIQUE: Multiplanar, multisequence MR imaging of the knee was performed. No
intravenous contrast was administered.

[Series 3: PD fat-sat · axial · 4.0mm · 0.50mm/px · z∈[-83,+42]mm · 7 of 26 slices shown (1 of 3)]
[im 1/26]
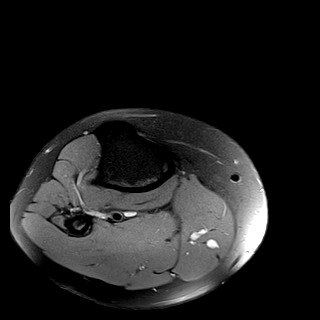
[im 5/26]
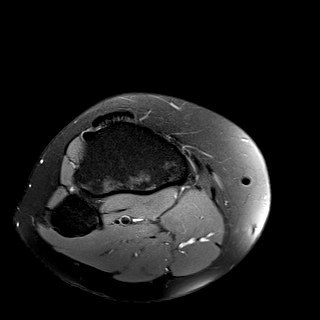
[im 9/26]
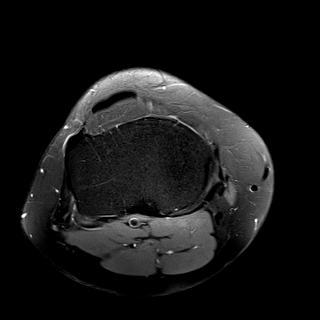
[im 13/26]
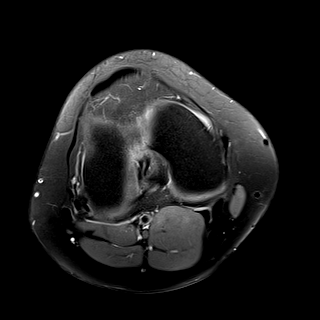
[im 17/26]
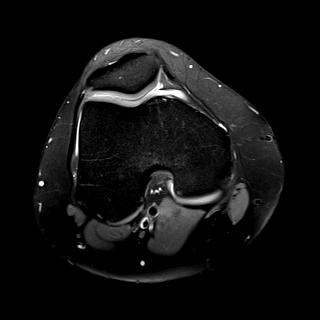
[im 21/26]
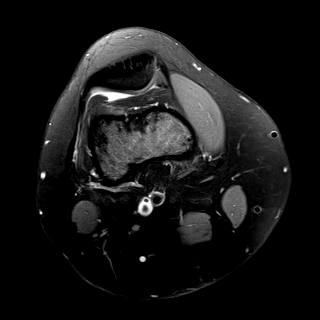
[im 26/26]
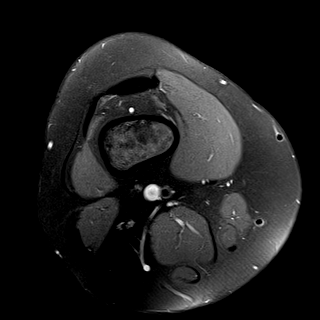

[Series 4: PD fat-sat · coronal · 4.0mm · 0.50mm/px · 7 of 22 slices shown (2 of 3)]
[im 1/22]
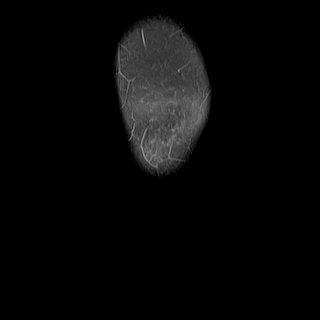
[im 4/22]
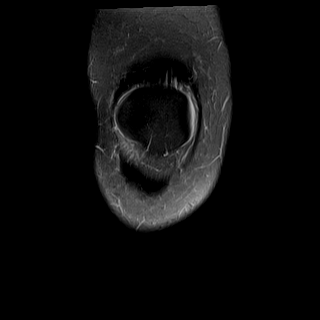
[im 8/22]
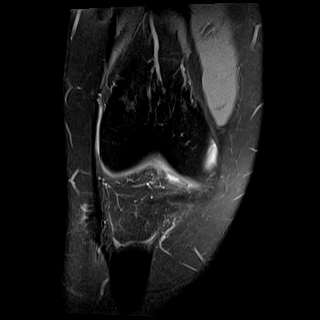
[im 11/22]
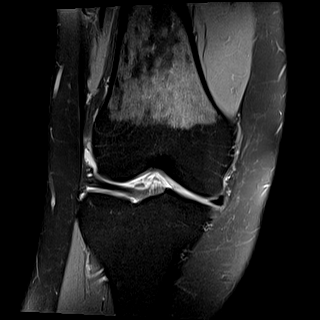
[im 15/22]
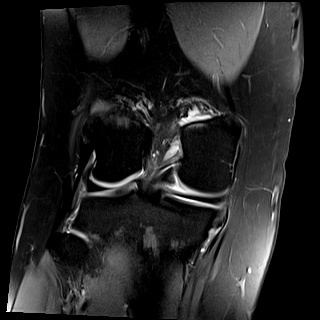
[im 18/22]
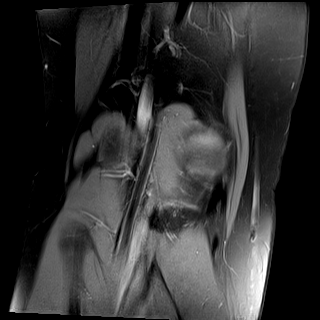
[im 22/22]
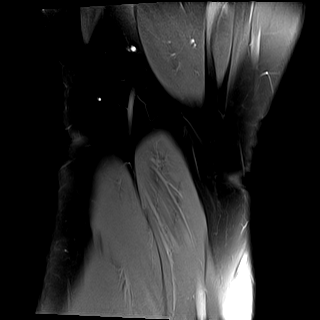

[Series 5: PD fat-sat · sagittal · 4.0mm · 0.50mm/px · 7 of 23 slices shown (3 of 3)]
[im 1/23]
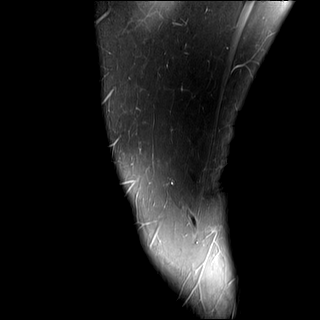
[im 4/23]
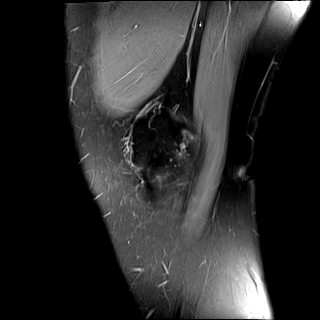
[im 8/23]
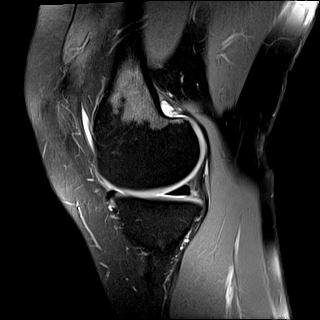
[im 12/23]
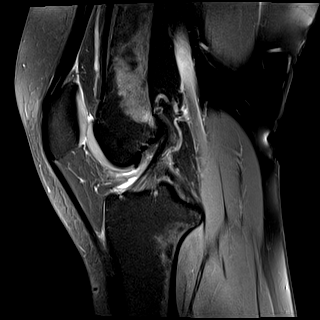
[im 15/23]
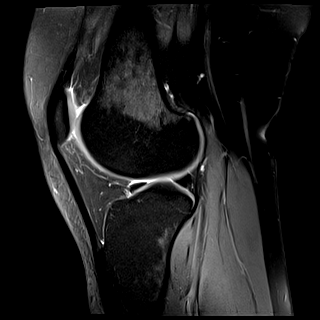
[im 19/23]
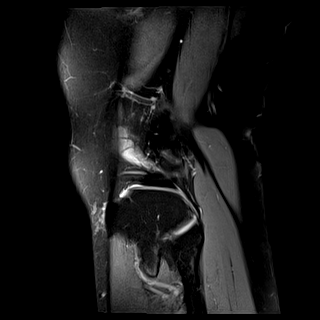
[im 23/23]
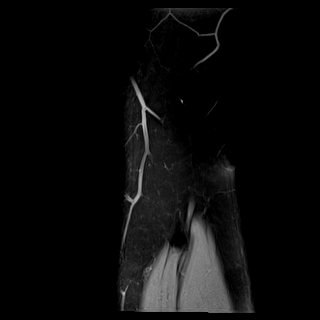

[Series 6: T2 fat-sat · coronal · 4.0mm · 0.50mm/px · 7 of 22 slices shown]
[im 1/22]
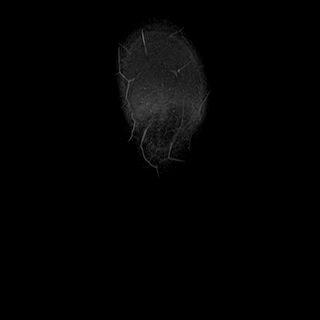
[im 4/22]
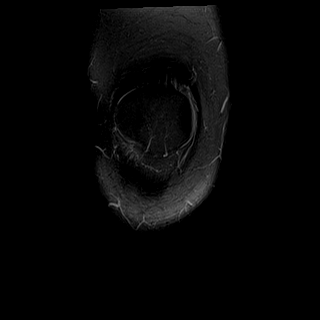
[im 8/22]
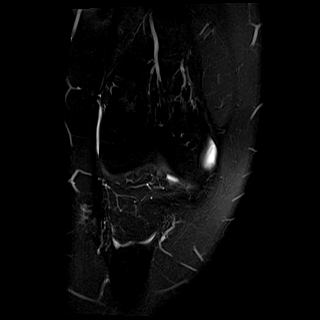
[im 11/22]
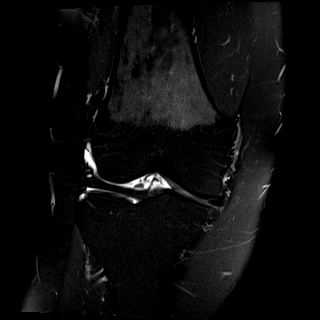
[im 15/22]
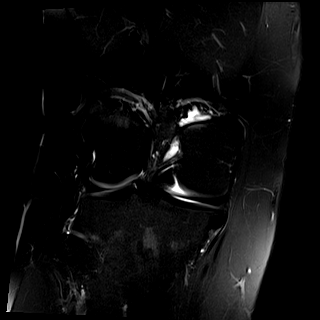
[im 18/22]
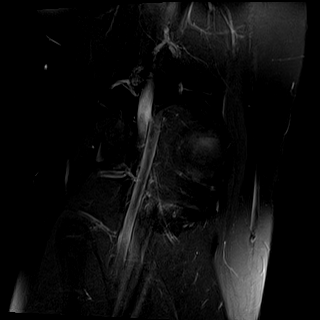
[im 22/22]
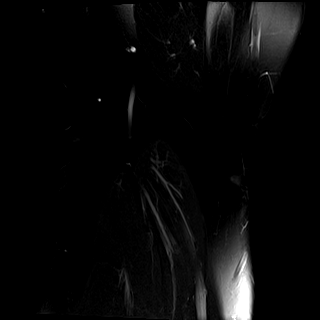

[Series 7: T1 · coronal · 4.0mm · 0.62mm/px · 7 of 22 slices shown]
[im 1/22]
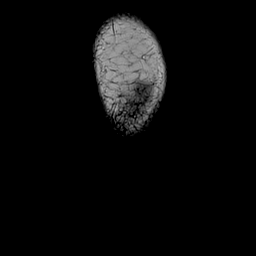
[im 4/22]
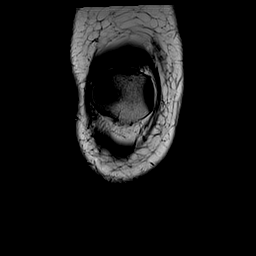
[im 8/22]
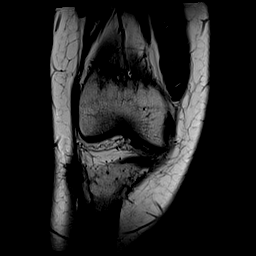
[im 11/22]
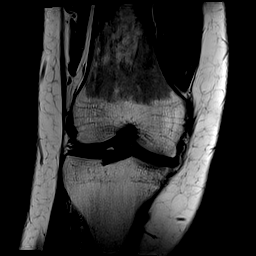
[im 15/22]
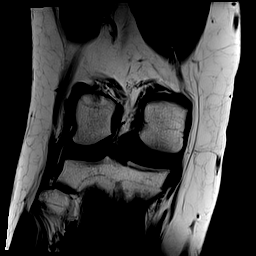
[im 18/22]
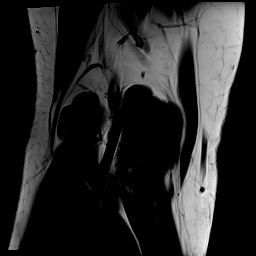
[im 22/22]
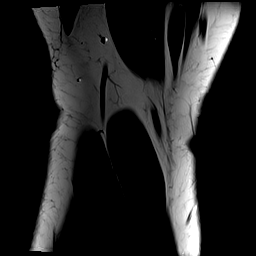

[Series 8: PD · coronal · 2.0mm · 0.50mm/px · 5 of 15 slices shown]
[im 1/15]
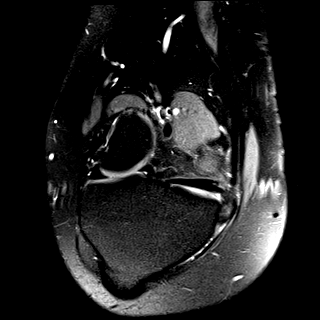
[im 4/15]
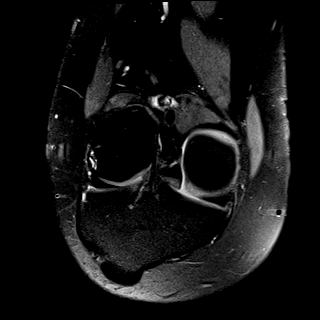
[im 8/15]
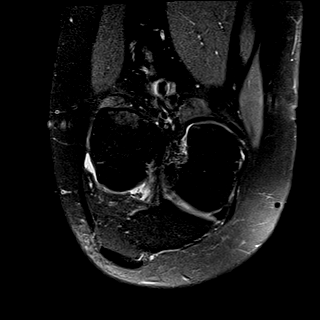
[im 11/15]
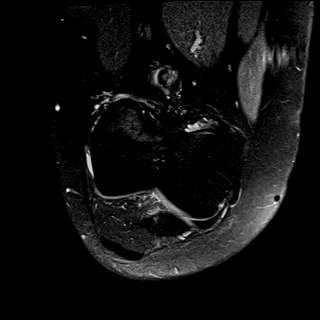
[im 15/15]
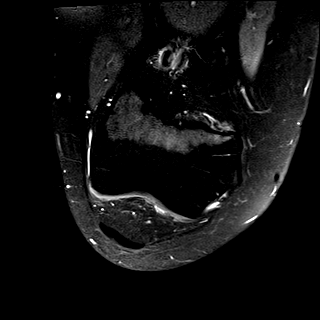

[40 of 40 positions shown; findings below may reference images not displayed]

FINDINGS: MENISCI

Medial meniscus: Grade 3 oblique tear of the periphery of the
posterior horn extending to the inferior surface on image [DATE].

Lateral meniscus:  Unremarkable

LIGAMENTS

Cruciates:  Unremarkable

Collaterals:  Unremarkable

CARTILAGE

Patellofemoral:  Unremarkable

Medial:  Unremarkable

Lateral:  Unremarkable

Joint:  Small knee joint effusion.  Mildly thickened medial plica.

Popliteal Fossa:  Unremarkable

Extensor Mechanism: Unremarkable. Tibial tubercle -trochlear groove
distance 1 cm.

Bones: Posterolateral proximal tibial metaphyseal osteochondroma, no
significant associated thick cartilaginous cap.
IMPRESSION: 1. Grade 3 oblique peripheral tear of the posterior horn medial
meniscus extending to the inferior meniscal surface.
2. Small knee joint effusion with mildly thickened medial plica.
3. Posterolateral proximal tibial metaphyseal osteochondroma without
thickened cartilaginous cap.

## 2016-08-13 ENCOUNTER — Encounter (HOSPITAL_COMMUNITY): Payer: Self-pay

## 2016-08-13 ENCOUNTER — Emergency Department (HOSPITAL_COMMUNITY)
Admission: EM | Admit: 2016-08-13 | Discharge: 2016-08-13 | Disposition: A | Discharge status: Home / Self Care | Payer: Medicaid Other | Attending: Emergency Medicine | Admitting: Emergency Medicine

## 2016-08-13 ENCOUNTER — Emergency Department (HOSPITAL_COMMUNITY): Payer: Medicaid Other

## 2016-08-13 DIAGNOSIS — Z87891 Personal history of nicotine dependence: Secondary | ICD-10-CM | POA: Insufficient documentation

## 2016-08-13 DIAGNOSIS — J45909 Unspecified asthma, uncomplicated: Secondary | ICD-10-CM | POA: Diagnosis not present

## 2016-08-13 DIAGNOSIS — R1084 Generalized abdominal pain: Secondary | ICD-10-CM

## 2016-08-13 DIAGNOSIS — K625 Hemorrhage of anus and rectum: Secondary | ICD-10-CM | POA: Diagnosis not present

## 2016-08-13 LAB — CBC
HCT: 37.3 % (ref 36.0–46.0)
Hemoglobin: 13.1 g/dL (ref 12.0–15.0)
MCH: 31 pg (ref 26.0–34.0)
MCHC: 35.1 g/dL (ref 30.0–36.0)
MCV: 88.2 fL (ref 78.0–100.0)
PLATELETS: 350 10*3/uL (ref 150–400)
RBC: 4.23 MIL/uL (ref 3.87–5.11)
RDW: 13.1 % (ref 11.5–15.5)
WBC: 12.2 10*3/uL — ABNORMAL HIGH (ref 4.0–10.5)

## 2016-08-13 LAB — COMPREHENSIVE METABOLIC PANEL
ALBUMIN: 3.8 g/dL (ref 3.5–5.0)
ALK PHOS: 105 U/L (ref 38–126)
ALT: 24 U/L (ref 14–54)
AST: 17 U/L (ref 15–41)
Anion gap: 11 (ref 5–15)
BUN: 7 mg/dL (ref 6–20)
CALCIUM: 9.5 mg/dL (ref 8.9–10.3)
CHLORIDE: 102 mmol/L (ref 101–111)
CO2: 24 mmol/L (ref 22–32)
CREATININE: 0.55 mg/dL (ref 0.44–1.00)
GFR calc non Af Amer: >60 mL/min (ref >60)
GLUCOSE: 109 mg/dL — AB (ref 65–99)
Potassium: 3.3 mmol/L — ABNORMAL LOW (ref 3.5–5.1)
SODIUM: 137 mmol/L (ref 135–145)
Total Bilirubin: 0.6 mg/dL (ref 0.3–1.2)
Total Protein: 7.9 g/dL (ref 6.5–8.1)

## 2016-08-13 LAB — URINALYSIS, ROUTINE W REFLEX MICROSCOPIC
Bacteria, UA: NONE SEEN
Bilirubin Urine: NEGATIVE
Glucose, UA: NEGATIVE mg/dL
Ketones, ur: 80 mg/dL — AB
Leukocytes, UA: NEGATIVE
Nitrite: NEGATIVE
PH: 6 (ref 5.0–8.0)
PROTEIN: 30 mg/dL — AB
Specific Gravity, Urine: 1.03 (ref 1.005–1.030)

## 2016-08-13 LAB — LIPASE, BLOOD: LIPASE: 14 U/L (ref 11–51)

## 2016-08-13 LAB — ETHANOL

## 2016-08-13 LAB — POC URINE PREG, ED: Preg Test, Ur: NEGATIVE

## 2016-08-13 MED ORDER — DICYCLOMINE HCL 20 MG PO TABS: 20.0000 mg | ORAL_TABLET | Freq: Three times a day (TID) | ORAL | 0 refills | Status: DC | PRN

## 2016-08-13 MED ORDER — FENTANYL CITRATE (PF) 100 MCG/2ML IJ SOLN
100.0000 ug | INTRAMUSCULAR | Status: DC | PRN
Start: 2016-08-13 — End: 2016-08-13
  Administered 2016-08-13: 100 ug via INTRAVENOUS
  Filled 2016-08-13: qty 2

## 2016-08-13 MED ORDER — IOPAMIDOL (ISOVUE-300) INJECTION 61%
INTRAVENOUS | Status: AC
  Administered 2016-08-13: 100 mL
  Filled 2016-08-13: qty 100

## 2016-08-13 MED ORDER — ONDANSETRON HCL 4 MG/2ML IJ SOLN
4.0000 mg | Freq: Once | INTRAMUSCULAR | Status: AC
  Administered 2016-08-13: 4 mg via INTRAVENOUS
  Filled 2016-08-13: qty 2

## 2016-08-13 MED ORDER — SODIUM CHLORIDE 0.9 % IV BOLUS (SEPSIS)
1000.0000 mL | Freq: Once | INTRAVENOUS | Status: AC
  Administered 2016-08-13: 1000 mL via INTRAVENOUS

## 2016-08-13 NOTE — ED Notes (Signed)
Pt has been ask several times to pee and refuses to even attempt.

## 2016-08-13 NOTE — ED Provider Notes (Signed)
[facility] DEPT Provider Note   CSN: [id number] Arrival date & time: 08/13/16  [id number]     History   Chief Complaint Chief Complaint  Patient presents with  . Abdominal Pain    HPI Katrina [name] is a 26 y.o. female.  She presents for evaluation of abdominal pain associated with rectal bleeding, for 2 months. The pain is worsening despite symptomatic treatment. She was recently seen and evaluated, diagnosed with anal stricture, and treated with Cipro for suspected colitis. She states that she is unable to eat much. She has had periods of vomiting as many as 15 per day in the last week. She has not had any fever, cough, chest pain, focal weakness or paresthesia. She has not had a problem like this previously. She has bowel movements  HPI  Past Medical History:  Diagnosis Date  . Asthma   . Headache in pregnancy   . Herpes simplex of female genitalia    last outbreak 4 months ago  . Migraine   . Migraines     Patient Active Problem List   Diagnosis Date Noted  . Right knee pain 09/27/2015  . Biliary colic [id number]  . Recurrent biliary colic Q000111Q  . Cholestasis of pregnancy 01/03/2015  . [redacted] weeks gestation of pregnancy   . Gestational hypertension   . Pregnancy 12/19/2014  . Migraine without aura and without status migrainosus, not intractable 08/21/2014  . Pregnancy complication [id number]  . Low-lying placenta   . Preterm premature rupture of membranes with onset of labor more than 24 hours following rupture in second trimester   . Vaginal bleeding in pregnancy   . [redacted] weeks gestation of pregnancy   . Amniotic fluid leaking   . Pregnant 05/26/2014  . Smoker 05/26/2014  . Breast lump on right side at 10 o'clock position 03/21/2014  . 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 CHOLECYSTECTOMY  N/A 01/17/2015   Procedure: LAPAROSCOPIC CHOLECYSTECTOMY WITH INTRAOPERATIVE CHOLANGIOGRAM;  Surgeon: Excell Seltzer, MD;  Location: [facility];  Service: General;  Laterality: N/A;  . COLPOSCOPY  03/2014    OB History    Gravida Para Term Preterm AB Living   3 3 3  0 0 3   SAB TAB Ectopic Multiple Live Births   0 0 0 0 3       Home Medications    Prior to Admission medications   Medication Sig Start Date End Date Taking? Authorizing Provider  albuterol (PROVENTIL HFA;VENTOLIN HFA) 108 (90 BASE) MCG/ACT inhaler Inhale 2 puffs into the lungs every 6 (six) hours as needed for wheezing or shortness of breath.    Historical Provider, MD  ALL DAY ALLERGY-D 5-120 MG tablet Take 1 tablet by mouth 2 (two) times daily as needed for allergies.  07/22/16   Historical Provider, MD  ciprofloxacin (CIPRO) 500 MG tablet Take 1 tablet (500 mg total) by mouth 2 (two) times daily. [id number]   Delora Fuel, MD  dicyclomine (BENTYL) 20 MG tablet Take 1 tablet (20 mg total) by mouth 3 (three) times daily as needed for spasms. 08/13/16   Daleen Bo, MD  traMADol (ULTRAM) 50 MG tablet Take 1 tablet (50 mg total) by mouth every 6 (six) hours as needed. [id number]   Delora Fuel, MD    Family History Family History  Problem Relation Age of Onset  . Asthma Mother   .  Asthma Sister   . Asthma Sister     Social History Social History  Substance Use Topics  . Smoking status: Former Smoker    Packs/day: 0.25    Years: 2.00    Types: Cigarettes    Quit date: 05/02/2013  . Smokeless tobacco: Never Used  . Alcohol use No     Comment: socially     Allergies   Shellfish allergy; Other; and Penicillins   Review of Systems Review of Systems  All other systems reviewed and are negative.    Physical Exam Updated Vital Signs BP 120/65   Pulse 92   Temp 98.5 F (36.9 C) (Oral)   Resp 16   Ht 5\' 1"  (1.549 m)   Wt 146 lb (66.2 kg)   LMP 07/19/2016 (Exact Date)   SpO2 98%   BMI 27.59 kg/m   Physical  Exam  Constitutional: She is oriented to person, place, and time. She appears well-developed and well-nourished. She appears distressed (She is uncomfortable).  HENT:  Head: Normocephalic and atraumatic.  Eyes: Conjunctivae and EOM are normal. Pupils are equal, round, and reactive to light.  Neck: Normal range of motion and phonation normal. Neck supple.  Cardiovascular: Normal rate and regular rhythm.   Pulmonary/Chest: Effort normal and breath sounds normal. She exhibits no tenderness.  Abdominal: Soft. She exhibits no distension. There is tenderness (Diffuse, moderate). There is no rebound and no guarding.  Genitourinary:  Genitourinary Comments: Ileus is normal externally. There is no evidence for fissuring, or abnormal anal architecture. On digital anal exam, she has moderate tenderness but there is no palpable anal stricture. Within the rectum. There is a small amount of brown stool. There is no palpable rectal mass. There is no blood seen on the rectal examination  Musculoskeletal: Normal range of motion.  Neurological: She is alert and oriented to person, place, and time. She exhibits normal muscle tone.  Skin: Skin is warm and dry.  Psychiatric: She has a normal mood and affect. Her behavior is normal. Judgment and thought content normal.  Nursing note and vitals reviewed.    ED Treatments / Results  Labs (all labs ordered are listed, but only abnormal results are displayed) Labs Reviewed  COMPREHENSIVE METABOLIC PANEL - Abnormal; Notable for the following:       Result Value   Potassium 3.3 (*)    Glucose, Bld 109 (*)    All other components within normal limits  CBC - Abnormal; Notable for the following:    WBC 12.2 (*)    All other components within normal limits  URINALYSIS, ROUTINE W REFLEX MICROSCOPIC - Abnormal; Notable for the following:    Hgb urine dipstick SMALL (*)    Ketones, ur 80 (*)    Protein, ur 30 (*)    Squamous Epithelial / LPF 0-5 (*)    All other  components within normal limits  LIPASE, BLOOD  ETHANOL  POC URINE PREG, ED    EKG  EKG Interpretation None       Radiology Ct Abdomen Pelvis W Contrast  Result Date: 08/13/2016 CLINICAL DATA:  Abdominal pain, rectal bleeding, nausea, vomiting for 3 days EXAM: CT ABDOMEN AND PELVIS WITH CONTRAST TECHNIQUE: Multidetector CT imaging of the abdomen and pelvis was performed using the standard protocol following bolus administration of intravenous contrast. CONTRAST:  139mL ISOVUE-300 IOPAMIDOL (ISOVUE-300) INJECTION 61% COMPARISON:  None. FINDINGS: Lower chest: No acute abnormality. Hepatobiliary: 2.3 x 1.5 cm hypodense mass in the left hepatic lobe adjacent to  the falciform ligament likely reflecting an area of focal fatty deposition. Prior cholecystectomy. Pancreas: Unremarkable. No pancreatic ductal dilatation or surrounding inflammatory changes. Spleen: Normal in size without focal abnormality. Adrenals/Urinary Tract: Normal adrenal glands. Normal kidneys. No urolithiasis or obstructive uropathy. Decompressed bladder. Stomach/Bowel: Stomach is within normal limits. Appendix appears normal. No evidence of bowel wall thickening, distention, or inflammatory changes. No pneumatosis, pneumoperitoneum or portal venous gas. Vascular/Lymphatic: No significant vascular findings are present. No enlarged abdominal or pelvic lymph nodes. Reproductive: Uterus and bilateral adnexa are unremarkable. Other: No fluid collection or hematoma. Musculoskeletal: No acute osseous abnormality. No lytic or sclerotic osseous lesion. IMPRESSION: 1. No acute abdominal or pelvic pathology. Electronically Signed   By: Kathreen Devoid   On: 08/13/2016 14:34    Procedures Procedures (including critical care time)  Medications Ordered in ED Medications  fentaNYL (SUBLIMAZE) injection 100 mcg (100 mcg Intravenous Given 08/13/16 1058)  sodium chloride 0.9 % bolus 1,000 mL (0 mLs Intravenous Stopped 08/13/16 1159)  ondansetron  (ZOFRAN) injection 4 mg (4 mg Intravenous Given 08/13/16 1058)  iopamidol (ISOVUE-300) 61 % injection (100 mLs  Contrast Given 08/13/16 1417)     Initial Impression / Assessment and Plan / ED Course  I have reviewed the triage vital signs and the nursing notes.  Pertinent labs & imaging results that were available during my care of the patient were reviewed by me and considered in my medical decision making (see chart for details).     Medications  fentaNYL (SUBLIMAZE) injection 100 mcg (100 mcg Intravenous Given 08/13/16 1058)  sodium chloride 0.9 % bolus 1,000 mL (0 mLs Intravenous Stopped 08/13/16 1159)  ondansetron (ZOFRAN) injection 4 mg (4 mg Intravenous Given 08/13/16 1058)  iopamidol (ISOVUE-300) 61 % injection (100 mLs  Contrast Given 08/13/16 1417)    Patient Vitals for the past 24 hrs:  BP Temp Temp src Pulse Resp SpO2 Height Weight  08/13/16 1533 120/65 - - 92 16 98 % - -  08/13/16 1330 116/77 - - 78 10 98 % - -  08/13/16 1315 119/72 - - 80 18 99 % - -  08/13/16 1300 116/77 - - 83 14 99 % - -  08/13/16 1130 105/59 - - 78 13 100 % - -  08/13/16 1045 113/57 - - 74 16 99 % - -  08/13/16 1015 112/69 - - 88 10 99 % - -  08/13/16 0900 109/75 98.5 F (36.9 C) Oral 110 20 100 % 5\' 1"  (1.549 m) 146 lb (66.2 kg)    3:39 PM Reevaluation with update and discussion. After initial assessment and treatment, an updated evaluation reveals No change in clinical status. Findings discussed with the patient and all questions answered. Allin Frix L    Final Clinical Impressions(s) / ED Diagnoses   Final diagnoses:  Generalized abdominal pain  Rectum bleeding    Nonspecific abdominal pain, and reported rectal bleeding. Recent treatment with Cipro, for suspected colitis. No colitis seen on CT imaging today. Hemoglobin is normal today. Doubt serious bacterial infection. Metabolic instability or impending vascular collapse. No evidence for colitis, or acute intra-abdominal  processes.  Nursing Notes Reviewed/ Care Coordinated Applicable Imaging Reviewed Interpretation of Laboratory Data incorporated into ED treatment  The patient appears reasonably screened and/or stabilized for discharge and I doubt any other medical condition or other [facility] requiring further screening, evaluation, or treatment in the ED at this time prior to discharge.  Plan: Home Medications- continue; Home Treatments- rest; return here if the recommended treatment,  does not improve the symptoms; Recommended follow up- PCP prn    New Prescriptions New Prescriptions   DICYCLOMINE (BENTYL) 20 MG TABLET    Take 1 tablet (20 mg total) by mouth 3 (three) times daily as needed for spasms.     Daleen Bo, MD 08/13/16 1540

## 2016-08-13 NOTE — ED Triage Notes (Signed)
Pt reports abd pain for  2 weeks. She also reports increased blood in her stool and an urgency to have bowel movements. Pt also reports low energy.

## 2016-08-13 NOTE — Discharge Instructions (Signed)
The CAT scan did not show any source for your pain and bleeding.  Your blood tests were normal.  When we did a anal and rectal examination, there was no sign of an anal fissure and no sign of blood in the rectum.  Since we don't know why your pain is there, follow-up with the GI doctor for further evaluation and treatment

## 2016-08-13 NOTE — ED Notes (Signed)
In CT

## 2016-08-13 NOTE — ED Notes (Signed)
Pt aware we need urine sample.  Advised she would let us know when she is able to give same.

## 2016-09-15 ENCOUNTER — Encounter (HOSPITAL_COMMUNITY): Payer: Self-pay

## 2016-09-15 ENCOUNTER — Inpatient Hospital Stay (HOSPITAL_COMMUNITY)
Admission: AD | Admit: 2016-09-15 | Discharge: 2016-09-15 | Disposition: A | Discharge status: Home / Self Care | Payer: Medicaid Other | Source: Ambulatory Visit | Attending: Obstetrics and Gynecology | Admitting: Obstetrics and Gynecology

## 2016-09-15 DIAGNOSIS — Z91013 Allergy to seafood: Secondary | ICD-10-CM | POA: Insufficient documentation

## 2016-09-15 DIAGNOSIS — Z880 Allergy status to penicillin: Secondary | ICD-10-CM | POA: Insufficient documentation

## 2016-09-15 DIAGNOSIS — H60391 Other infective otitis externa, right ear: Secondary | ICD-10-CM | POA: Diagnosis not present

## 2016-09-15 DIAGNOSIS — J45909 Unspecified asthma, uncomplicated: Secondary | ICD-10-CM | POA: Diagnosis not present

## 2016-09-15 DIAGNOSIS — Z87891 Personal history of nicotine dependence: Secondary | ICD-10-CM | POA: Insufficient documentation

## 2016-09-15 DIAGNOSIS — R112 Nausea with vomiting, unspecified: Secondary | ICD-10-CM | POA: Insufficient documentation

## 2016-09-15 DIAGNOSIS — Z825 Family history of asthma and other chronic lower respiratory diseases: Secondary | ICD-10-CM | POA: Diagnosis not present

## 2016-09-15 DIAGNOSIS — H9203 Otalgia, bilateral: Secondary | ICD-10-CM | POA: Diagnosis present

## 2016-09-15 MED ORDER — NEOMYCIN-POLYMYXIN-HC 3.5-10000-1 OT SUSP: 3.0000 [drp] | Freq: Three times a day (TID) | OTIC | 0 refills | Status: DC

## 2016-09-15 NOTE — MAU Note (Signed)
Pt presents for confirmation of pregnancy. Positive pregnancy test at home and wants to know how far along she is. Denies abdominal pain or bleeding. Pt states she is having ear pain. LMP beginning of January.

## 2016-09-15 NOTE — Discharge Instructions (Signed)
Ear Drops, Adult Your doctor has found that you have a condition that requires you to use ear drops. Ear drops are a medicine that is placed in the ear. This sheet gives you information about how to use this medicine. Your doctor may also give you more specific instructions. Supplies needed:  Cotton ball.  Medicine. How to put ear drops into your ear 1. Wash your hands with soap and water. 2. Make sure your ears are clean and dry. 3. Warm the medicine by holding it in your hand for a few minutes. 4. Shake the medicine to mix the ear drops. 5. Use the tube to get the medicine. You will need to squeeze the round part of the tube to do this. 6. Put the drops in your ear as told. Hold the tube above your ear. Do not let the tube touch your ear. The medicine may go in easier if you pull the flap of your ear up and back while you put the drops in. 7. To make sure your ear soaks up the medicine, do one of these things:  Lie down for 10 minutes. The ear with the medicine should face up.  Put a cotton ball in your ear. Do not push it deeper into your ear. Take out the cotton ball when the drops have been soaked up. 8. If you need to put drops in your other ear, repeat the same steps. Your doctor will tell you if you should put drops in both ears. Follow these instructions at home:  Use the ear drops for as long as your doctor tells you to. Do not stop even if your symptoms get better.  Keep the ear drops at room temperature.  Keep all follow-up visits as told by your doctor. This is important. Contact a doctor if:  Your condition gets worse.  Your pain gets worse.  Unusual fluid (drainage) is coming from your ear, especially if the fluid stinks.  You have trouble hearing. Get help right away if:  You feel like the room is spinning and you feel sick to your stomach. This condition is called vertigo.  The outside of your ear becomes red or swollen.  You have a very bad  headache. Summary  Ear drops are a medicine that is put in the ear.  Put the drops in your ear as told by your doctor.  Use the ear drops for as long as your doctor tells you to. Do not stop even if your symptoms get better. This information is not intended to replace advice given to you by your health care provider. Make sure you discuss any questions you have with your health care provider. Document Released: 12/04/2009 Document Revised: 06/20/2016 Document Reviewed: 06/20/2016 Elsevier Interactive Patient Education  2017 [employer]   Otitis Externa Otitis externa is an infection of the outer ear canal. The outer ear canal is the area between the outside of the ear and the eardrum. Otitis externa is sometimes called "swimmer's ear." What are the causes? This condition may be caused by:  Swimming in dirty water.  Moisture in the ear.  An injury to the inside of the ear.  An object stuck in the ear.  A cut or scrape on the outside of the ear. What increases the risk? This condition is more likely to develop in swimmers. What are the signs or symptoms? The first symptom of this condition is often itching in the ear. Later signs and symptoms include:  Swelling of  the ear.  Redness in the ear.  Ear pain. The pain may get worse when you pull on your ear.  Pus coming from the ear. How is this diagnosed? This condition may be diagnosed by examining the ear and testing fluid from the ear for bacteria and funguses. How is this treated? This condition may be treated with:  Antibiotic ear drops. These are often given for 10-14 days.  Medicine to reduce itching and swelling. Follow these instructions at home:  If you were prescribed antibiotic ear drops, apply them as told by your health care provider. Do not stop using the antibiotic even if your condition improves.  Take over-the-counter and prescription medicines only as told by your health care provider.  Keep all  follow-up visits as told by your health care provider. This is important. How is this prevented?  Keep your ear dry. Use the corner of a towel to dry your ear after you swim or bathe.  Avoid scratching or putting things in your ear. Doing these things can damage the ear canal or remove the protective wax that lines it, which makes it easier for bacteria and funguses to grow.  Avoid swimming in lakes, polluted water, or pools that may not have the right amount of chlorine.  Consider making ear drops and putting 3 or 4 drops in each ear after you swim. Ask your health care provider about how you can make ear drops. Contact a health care provider if:  You have a fever.  After 3 days your ear is still red, swollen, painful, or draining pus.  Your redness, swelling, or pain gets worse.  You have a severe headache.  You have redness, swelling, pain, or tenderness in the area behind your ear. This information is not intended to replace advice given to you by your health care provider. Make sure you discuss any questions you have with your health care provider. Document Released: 06/16/2005 Document Revised: 07/24/2015 Document Reviewed: 03/26/2015 Elsevier Interactive Patient Education  2017 [employer].

## 2016-09-15 NOTE — MAU Provider Note (Signed)
History     CSN: [id number]  Arrival date and time: 09/15/16 2014   None     Chief Complaint  Patient presents with  . Otalgia   Katrina [name] is a 26 y.o. [phone] at Unknown who presents today for pregnancy confirmation. She denies any abdominal pain or vaginal bleeding. She is however having ear pain. She states that she has had this pain for about one month, and has seen her PCP about this. She has been taking tylenol, ibuprofen and decongestants without relief.    Otalgia   There is pain in both ears. This is a new problem. The current episode started more than 1 month ago. The problem occurs constantly. The problem has been unchanged. There has been no fever. The pain is at a severity of 10/10. Associated symptoms include vomiting. Pertinent negatives include no abdominal pain. She has tried acetaminophen and NSAIDs for the symptoms. The treatment provided no relief.    Past Medical History:  Diagnosis Date  . Asthma   . Headache in pregnancy   . Herpes simplex of female genitalia    last outbreak 4 months ago  . Migraine   . Migraines     Past Surgical History:  Procedure Laterality Date  . CHOLECYSTECTOMY N/A 01/17/2015   Procedure: LAPAROSCOPIC CHOLECYSTECTOMY WITH INTRAOPERATIVE CHOLANGIOGRAM;  Surgeon: Excell Seltzer, MD;  Location: [facility];  Service: General;  Laterality: N/A;  . COLPOSCOPY  03/2014    Family History  Problem Relation Age of Onset  . Asthma Mother   . Asthma Sister   . Asthma Sister     Social History  Substance Use Topics  . Smoking status: Former Smoker    Packs/day: 0.25    Years: 2.00    Types: Cigarettes    Quit date: 05/02/2013  . Smokeless tobacco: Never Used  . Alcohol use No     Comment: socially    Allergies:  Allergies  Allergen Reactions  . Shellfish Allergy Shortness Of Breath and Swelling    Tongue  . Other     Tylenol #3---Swelling   . Penicillins Itching, Swelling and Rash    Sweating, Has patient had a PCN  reaction causing immediate rash, facial/tongue/throat swelling, SOB or lightheadedness with hypotension: NO Has patient had a PCN reaction causing severe rash involving mucus membranes or skin necrosis:NO Has patient had a PCN reaction that required hospitalization NO Has patient had a PCN reaction occurring within the last 10 years:NO If all of the above answers are "NO", then may proceed with Cephalosporin use.      Prescriptions Prior to Admission  Medication Sig Dispense Refill Last Dose  . albuterol (PROVENTIL HFA;VENTOLIN HFA) 108 (90 BASE) MCG/ACT inhaler Inhale 2 puffs into the lungs every 6 (six) hours as needed for wheezing or shortness of breath.   07/25/2016 at Unknown time  . ALL DAY ALLERGY-D 5-120 MG tablet Take 1 tablet by mouth 2 (two) times daily as needed for allergies.   2 07/25/2016 at Unknown time  . ciprofloxacin (CIPRO) 500 MG tablet Take 1 tablet (500 mg total) by mouth 2 (two) times daily. 6 tablet 0   . dicyclomine (BENTYL) 20 MG tablet Take 1 tablet (20 mg total) by mouth 3 (three) times daily as needed for spasms. 30 tablet 0   . traMADol (ULTRAM) 50 MG tablet Take 1 tablet (50 mg total) by mouth every 6 (six) hours as needed. 10 tablet 0     Review of Systems  Constitutional: Negative for chills and fever.  HENT: Positive for ear pain.   Gastrointestinal: Positive for nausea and vomiting. Negative for abdominal pain.  Genitourinary: Negative for dysuria, pelvic pain, urgency, vaginal bleeding and vaginal discharge.   Physical Exam   Blood pressure 123/65, pulse 77, temperature 98 F (36.7 C), temperature source Oral, resp. rate 18, height 5\' 4"  (1.626 m), weight 142 lb (64.4 kg), last menstrual period 07/02/2016, not currently breastfeeding.  Physical Exam  Nursing note and vitals reviewed. Constitutional: She is oriented to person, place, and time. She appears well-developed and well-nourished. No distress.  HENT:  Head: Normocephalic.  Cardiovascular:  Normal rate.   Respiratory: Effort normal.  GI: Soft. There is no tenderness. There is no rebound.  Neurological: She is alert and oriented to person, place, and time.  Skin: Skin is warm and dry.  Psychiatric: She has a normal mood and affect.    MAU Course  Procedures  MDM   Assessment and Plan   1. Infective otitis externa of right ear    DC home Comfort measures reviewed  1st Trimester precautions  RX: cortisporin 3 drops TID  Return to MAU as needed Can FU with clinic for pregnancy confirmation   Follow-up Information    [facility] Follow up.   Contact information: [place] [address] [phone]            Mathis Bud 09/15/2016, 8:55 PM

## 2016-09-18 ENCOUNTER — Inpatient Hospital Stay (HOSPITAL_COMMUNITY)
Admission: AD | Admit: 2016-09-18 | Discharge: 2016-09-19 | Disposition: A | Discharge status: Home / Self Care | Payer: Medicaid Other | Source: Ambulatory Visit | Attending: Obstetrics and Gynecology | Admitting: Obstetrics and Gynecology

## 2016-09-18 ENCOUNTER — Encounter (HOSPITAL_COMMUNITY): Payer: Self-pay

## 2016-09-18 ENCOUNTER — Inpatient Hospital Stay (HOSPITAL_COMMUNITY): Payer: Medicaid Other

## 2016-09-18 DIAGNOSIS — Z91013 Allergy to seafood: Secondary | ICD-10-CM | POA: Diagnosis not present

## 2016-09-18 DIAGNOSIS — R102 Pelvic and perineal pain: Secondary | ICD-10-CM | POA: Diagnosis present

## 2016-09-18 DIAGNOSIS — O26891 Other specified pregnancy related conditions, first trimester: Secondary | ICD-10-CM | POA: Insufficient documentation

## 2016-09-18 DIAGNOSIS — Z3490 Encounter for supervision of normal pregnancy, unspecified, unspecified trimester: Secondary | ICD-10-CM

## 2016-09-18 DIAGNOSIS — Z3A01 Less than 8 weeks gestation of pregnancy: Secondary | ICD-10-CM | POA: Insufficient documentation

## 2016-09-18 DIAGNOSIS — Z825 Family history of asthma and other chronic lower respiratory diseases: Secondary | ICD-10-CM | POA: Diagnosis not present

## 2016-09-18 DIAGNOSIS — Z880 Allergy status to penicillin: Secondary | ICD-10-CM | POA: Diagnosis not present

## 2016-09-18 DIAGNOSIS — Z87891 Personal history of nicotine dependence: Secondary | ICD-10-CM | POA: Diagnosis not present

## 2016-09-18 DIAGNOSIS — O9989 Other specified diseases and conditions complicating pregnancy, childbirth and the puerperium: Secondary | ICD-10-CM | POA: Diagnosis not present

## 2016-09-18 LAB — URINALYSIS, ROUTINE W REFLEX MICROSCOPIC
BILIRUBIN URINE: NEGATIVE
Glucose, UA: NEGATIVE mg/dL
KETONES UR: NEGATIVE mg/dL
Leukocytes, UA: NEGATIVE
NITRITE: NEGATIVE
PROTEIN: NEGATIVE mg/dL
Specific Gravity, Urine: 1.013 (ref 1.005–1.030)
pH: 6 (ref 5.0–8.0)

## 2016-09-18 LAB — WET PREP, GENITAL
SPERM: NONE SEEN
Trich, Wet Prep: NONE SEEN
YEAST WET PREP: NONE SEEN

## 2016-09-18 LAB — CBC
HCT: 33.4 % — ABNORMAL LOW (ref 36.0–46.0)
HEMOGLOBIN: 11.5 g/dL — AB (ref 12.0–15.0)
MCH: 30.3 pg (ref 26.0–34.0)
MCHC: 34.4 g/dL (ref 30.0–36.0)
MCV: 88.1 fL (ref 78.0–100.0)
PLATELETS: 313 10*3/uL (ref 150–400)
RBC: 3.79 MIL/uL — ABNORMAL LOW (ref 3.87–5.11)
RDW: 13.4 % (ref 11.5–15.5)
WBC: 8.8 10*3/uL (ref 4.0–10.5)

## 2016-09-18 LAB — POCT PREGNANCY, URINE: PREG TEST UR: POSITIVE — AB

## 2016-09-18 NOTE — MAU Provider Note (Signed)
History     CSN: [id number]  Arrival date and time: 09/18/16 2223   First Provider Initiated Contact with Patient 09/18/16 2256      Chief Complaint  Patient presents with  . Pelvic Pain   Pelvic Pain  The patient's primary symptoms include pelvic pain. The patient's pertinent negatives include no vaginal discharge. This is a new problem. The current episode started yesterday. The problem occurs constantly. The problem has been gradually worsening. Pain severity now: 8/10. The problem affects both sides. She is pregnant. Associated symptoms include nausea and vomiting. Pertinent negatives include no chills, dysuria, fever or urgency. The vaginal discharge was normal. There has been no bleeding. Nothing aggravates the symptoms. She has tried nothing for the symptoms. Her menstrual history has been regular (LMP: 08/05/16 ).    Past Medical History:  Diagnosis Date  . Asthma   . Headache in pregnancy   . Herpes simplex of female genitalia    last outbreak 4 months ago  . Migraine   . Migraines     Past Surgical History:  Procedure Laterality Date  . CHOLECYSTECTOMY N/A 01/17/2015   Procedure: LAPAROSCOPIC CHOLECYSTECTOMY WITH INTRAOPERATIVE CHOLANGIOGRAM;  Surgeon: Excell Seltzer, MD;  Location: [facility];  Service: General;  Laterality: N/A;  . COLPOSCOPY  03/2014    Family History  Problem Relation Age of Onset  . Asthma Mother   . Asthma Sister   . Asthma Sister     Social History  Substance Use Topics  . Smoking status: Former Smoker    Packs/day: 0.25    Years: 2.00    Types: Cigarettes    Quit date: 05/02/2013  . Smokeless tobacco: Never Used  . Alcohol use No     Comment: socially    Allergies:  Allergies  Allergen Reactions  . Shellfish Allergy Shortness Of Breath and Swelling    Tongue  . Other     Tylenol #3---Swelling   . Penicillins Itching, Swelling and Rash    Sweating, Has patient had a PCN reaction causing immediate rash, facial/tongue/throat  swelling, SOB or lightheadedness with hypotension: NO Has patient had a PCN reaction causing severe rash involving mucus membranes or skin necrosis:NO Has patient had a PCN reaction that required hospitalization NO Has patient had a PCN reaction occurring within the last 10 years:NO If all of the above answers are "NO", then may proceed with Cephalosporin use.      Prescriptions Prior to Admission  Medication Sig Dispense Refill Last Dose  . albuterol (PROVENTIL HFA;VENTOLIN HFA) 108 (90 BASE) MCG/ACT inhaler Inhale 2 puffs into the lungs every 6 (six) hours as needed for wheezing or shortness of breath.   07/25/2016 at Unknown time  . ALL DAY ALLERGY-D 5-120 MG tablet Take 1 tablet by mouth 2 (two) times daily as needed for allergies.   2 07/25/2016 at Unknown time  . neomycin-polymyxin-hydrocortisone (CORTISPORIN) 3.5-10000-1 otic suspension Place 3 drops into the right ear 3 (three) times daily. 10 mL 0   . traMADol (ULTRAM) 50 MG tablet Take 1 tablet (50 mg total) by mouth every 6 (six) hours as needed. 10 tablet 0     Review of Systems  Constitutional: Negative for chills and fever.  Gastrointestinal: Positive for nausea and vomiting.  Genitourinary: Positive for pelvic pain. Negative for dysuria, urgency, vaginal bleeding and vaginal discharge.   Physical Exam   Blood pressure 103/64, pulse 95, temperature 99.2 F (37.3 C), height 5\' 1"  (1.549 m), weight 144 lb 4 oz (65.4  kg), last menstrual period 08/05/2016, not currently breastfeeding.  Physical Exam  Nursing note and vitals reviewed. Constitutional: She is oriented to person, place, and time. She appears well-developed and well-nourished. No distress.  HENT:  Head: Normocephalic.  Cardiovascular: Normal rate.   Respiratory: Effort normal.  GI: Soft. There is tenderness (suprapubic tenderness ). There is no rebound.  Genitourinary:  Genitourinary Comments: Cervix: closed/nontender   Neurological: She is alert and oriented  to person, place, and time.  Skin: Skin is warm and dry.  Psychiatric: She has a normal mood and affect.   Results for orders placed or performed during the hospital encounter of 09/18/16 (from the past 24 hour(s))  Urinalysis, Routine w reflex microscopic     Status: Abnormal   Collection Time: 09/18/16 10:34 PM  Result Value Ref Range   Color, Urine YELLOW YELLOW   APPearance CLEAR CLEAR   Specific Gravity, Urine 1.013 1.005 - 1.030   pH 6.0 5.0 - 8.0   Glucose, UA NEGATIVE NEGATIVE mg/dL   Hgb urine dipstick MODERATE (A) NEGATIVE   Bilirubin Urine NEGATIVE NEGATIVE   Ketones, ur NEGATIVE NEGATIVE mg/dL   Protein, ur NEGATIVE NEGATIVE mg/dL   Nitrite NEGATIVE NEGATIVE   Leukocytes, UA NEGATIVE NEGATIVE   RBC / HPF 0-5 0 - 5 RBC/hpf   WBC, UA 0-5 0 - 5 WBC/hpf   Bacteria, UA RARE (A) NONE SEEN   Squamous Epithelial / LPF 0-5 (A) NONE SEEN   Mucous PRESENT   Pregnancy, urine POC     Status: Abnormal   Collection Time: 09/18/16 10:41 PM  Result Value Ref Range   Preg Test, Ur POSITIVE (A) NEGATIVE  Wet prep, genital     Status: Abnormal   Collection Time: 09/18/16 11:30 PM  Result Value Ref Range   Yeast Wet Prep HPF POC NONE SEEN NONE SEEN   Trich, Wet Prep NONE SEEN NONE SEEN   Clue Cells Wet Prep HPF POC PRESENT (A) NONE SEEN   WBC, Wet Prep HPF POC MODERATE (A) NONE SEEN   Sperm NONE SEEN   CBC     Status: Abnormal   Collection Time: 09/18/16 11:44 PM  Result Value Ref Range   WBC 8.8 4.0 - 10.5 K/uL   RBC 3.79 (L) 3.87 - 5.11 MIL/uL   Hemoglobin 11.5 (L) 12.0 - 15.0 g/dL   HCT 33.4 (L) 36.0 - 46.0 %   MCV 88.1 78.0 - 100.0 fL   MCH 30.3 26.0 - 34.0 pg   MCHC 34.4 30.0 - 36.0 g/dL   RDW 13.4 11.5 - 15.5 %   Platelets 313 150 - 400 K/uL   US Ob Comp Less 14 Wks  Result Date: 09/19/2016 CLINICAL DATA:  26 year old pregnant female with abdominal cramping. LMP: 08/05/2016 corresponding to an estimated gestational age of [redacted] weeks, 2 days. EXAM: OBSTETRIC <14 WK [place] AND  TRANSVAGINAL OB US TECHNIQUE: Both transabdominal and transvaginal ultrasound examinations were performed for complete evaluation of the gestation as well as the maternal uterus, adnexal regions, and pelvic cul-de-sac. Transvaginal technique was performed to assess early pregnancy. COMPARISON:  None. FINDINGS: Intrauterine gestational sac: Single intrauterine gestational sac. Yolk sac:  Seen Embryo:  Present Cardiac Activity: Detected Heart Rate: 140  bpm CRL:  9  mm   6 w   6 d                  [place] EDC: 05/08/2017 Subchorionic hemorrhage:  None visualized. Maternal uterus/adnexae: The maternal ovaries appear unremarkable.  A small corpus luteum is noted in the right ovary. IMPRESSION: Single live intrauterine pregnancy with an estimated gestational age of [redacted] weeks, 6 days based on today's ultrasound. Electronically Signed   By: Anner Crete M.D.   On: 09/19/2016 00:39   US Ob Transvaginal  Result Date: 09/19/2016 CLINICAL DATA:  26 year old pregnant female with abdominal cramping. LMP: 08/05/2016 corresponding to an estimated gestational age of [redacted] weeks, 2 days. EXAM: OBSTETRIC <14 WK [place] AND TRANSVAGINAL OB US TECHNIQUE: Both transabdominal and transvaginal ultrasound examinations were performed for complete evaluation of the gestation as well as the maternal uterus, adnexal regions, and pelvic cul-de-sac. Transvaginal technique was performed to assess early pregnancy. COMPARISON:  None. FINDINGS: Intrauterine gestational sac: Single intrauterine gestational sac. Yolk sac:  Seen Embryo:  Present Cardiac Activity: Detected Heart Rate: 140  bpm CRL:  9  mm   6 w   6 d                  [place] EDC: 05/08/2017 Subchorionic hemorrhage:  None visualized. Maternal uterus/adnexae: The maternal ovaries appear unremarkable. A small corpus luteum is noted in the right ovary. IMPRESSION: Single live intrauterine pregnancy with an estimated gestational age of [redacted] weeks, 6 days based on today's ultrasound. Electronically Signed    By: Anner Crete M.D.   On: 09/19/2016 00:39    MAU Course  Procedures  MDM   Assessment and Plan   1. Intrauterine pregnancy   2. Pelvic pain in pregnancy, antepartum, first trimester   3. [redacted] weeks gestation of pregnancy    DC home Comfort measures reviewed  1st Trimester precautions  Bleeding precautions RX: none  Return to MAU as needed FU with OB as planned  Follow-up Information    Marcial Pacas., MD Follow up.   Specialty:  Obstetrics and Gynecology Contact information: [address] [place] [address] [phone]            Mathis Bud 09/18/2016, 11:29 PM

## 2016-09-18 NOTE — MAU Note (Signed)
PT  SAYS SHE DID   HPT  LAST WED  - POSITIVE.   SHE CALLED  [facility]   TODAY-    FOR  ABD  PAIN-  THEY CALLED HER  BACK - BUT  SHE  WAS  ASLEEP-   .  NO BLEEDING.    NO BIRTH  CONTROL.     LAST SEX-   WED  NIGHT.

## 2016-09-19 DIAGNOSIS — O9989 Other specified diseases and conditions complicating pregnancy, childbirth and the puerperium: Secondary | ICD-10-CM

## 2016-09-19 DIAGNOSIS — R102 Pelvic and perineal pain: Secondary | ICD-10-CM | POA: Diagnosis not present

## 2016-09-19 LAB — HCG, QUANTITATIVE, PREGNANCY: hCG, Beta Chain, Quant, S: 56923 m[IU]/mL — ABNORMAL HIGH (ref <5)

## 2016-09-19 NOTE — Discharge Instructions (Signed)
First Trimester of Pregnancy The first trimester of pregnancy is from week 1 until the end of week 13 (months 1 through 3). A week after a sperm fertilizes an egg, the egg will implant on the wall of the uterus. This embryo will begin to develop into a baby. Genes from you and your partner will form the baby. The female genes will determine whether the baby will be a boy or a girl. At 6-8 weeks, the eyes and face will be formed, and the heartbeat can be seen on ultrasound. At the end of 12 weeks, all the baby's organs will be formed. Now that you are pregnant, you will want to do everything you can to have a healthy baby. Two of the most important things are to get good prenatal care and to follow your health care provider's instructions. Prenatal care is all the medical care you receive before the baby's birth. This care will help prevent, find, and treat any problems during the pregnancy and childbirth. Body changes during your first trimester Your body goes through many changes during pregnancy. The changes vary from woman to woman.  You may gain or lose a couple of pounds at first.  You may feel sick to your stomach (nauseous) and you may throw up (vomit). If the vomiting is uncontrollable, call your health care provider.  You may tire easily.  You may develop headaches that can be relieved by medicines. All medicines should be approved by your health care provider.  You may urinate more often. Painful urination may mean you have a bladder infection.  You may develop heartburn as a result of your pregnancy.  You may develop constipation because certain hormones are causing the muscles that push stool through your intestines to slow down.  You may develop hemorrhoids or swollen veins (varicose veins).  Your breasts may begin to grow larger and become tender. Your nipples may stick out more, and the tissue that surrounds them (areola) may become darker.  Your gums may bleed and may be  sensitive to brushing and flossing.  Dark spots or blotches (chloasma, mask of pregnancy) may develop on your face. This will likely fade after the baby is born.  Your menstrual periods will stop.  You may have a loss of appetite.  You may develop cravings for certain kinds of food.  You may have changes in your emotions from day to day, such as being excited to be pregnant or being concerned that something may go wrong with the pregnancy and baby.  You may have more vivid and strange dreams.  You may have changes in your hair. These can include thickening of your hair, rapid growth, and changes in texture. Some women also have hair loss during or after pregnancy, or hair that feels dry or thin. Your hair will most likely return to normal after your baby is born.  What to expect at prenatal visits During a routine prenatal visit:  You will be weighed to make sure you and the baby are growing normally.  Your blood pressure will be taken.  Your abdomen will be measured to track your baby's growth.  The fetal heartbeat will be listened to between weeks 10 and 14 of your pregnancy.  Test results from any previous visits will be discussed.  Your health care provider may ask you:  How you are feeling.  If you are feeling the baby move.  If you have had any abnormal symptoms, such as leaking fluid, bleeding, severe headaches,   or abdominal cramping.  If you are using any tobacco products, including cigarettes, chewing tobacco, and electronic cigarettes.  If you have any questions.  Other tests that may be performed during your first trimester include:  Blood tests to find your blood type and to check for the presence of any previous infections. The tests will also be used to check for low iron levels (anemia) and protein on red blood cells (Rh antibodies). Depending on your risk factors, or if you previously had diabetes during pregnancy, you may have tests to check for high blood  sugar that affects pregnant women (gestational diabetes).  Urine tests to check for infections, diabetes, or protein in the urine.  An ultrasound to confirm the proper growth and development of the baby.  Fetal screens for spinal cord problems (spina bifida) and Down syndrome.  HIV (human immunodeficiency virus) testing. Routine prenatal testing includes screening for HIV, unless you choose not to have this test.  You may need other tests to make sure you and the baby are doing well.  Follow these instructions at home: Medicines  Follow your health care provider's instructions regarding medicine use. Specific medicines may be either safe or unsafe to take during pregnancy.  Take a prenatal vitamin that contains at least 600 micrograms (mcg) of folic acid.  If you develop constipation, try taking a stool softener if your health care provider approves. Eating and drinking  Eat a balanced diet that includes fresh fruits and vegetables, whole grains, good sources of protein such as meat, eggs, or tofu, and low-fat dairy. Your health care provider will help you determine the amount of weight gain that is right for you.  Avoid raw meat and uncooked cheese. These carry germs that can cause birth defects in the baby.  Eating four or five small meals rather than three large meals a day may help relieve nausea and vomiting. If you start to feel nauseous, eating a few soda crackers can be helpful. Drinking liquids between meals, instead of during meals, also seems to help ease nausea and vomiting.  Limit foods that are high in fat and processed sugars, such as fried and sweet foods.  To prevent constipation: ? Eat foods that are high in fiber, such as fresh fruits and vegetables, whole grains, and beans. ? Drink enough fluid to keep your urine clear or pale yellow. Activity  Exercise only as directed by your health care provider. Most women can continue their usual exercise routine during  pregnancy. Try to exercise for 30 minutes at least 5 days a week. Exercising will help you: ? Control your weight. ? Stay in shape. ? Be prepared for labor and delivery.  Experiencing pain or cramping in the lower abdomen or lower back is a good sign that you should stop exercising. Check with your health care provider before continuing with normal exercises.  Try to avoid standing for long periods of time. Move your legs often if you must stand in one place for a long time.  Avoid heavy lifting.  Wear low-heeled shoes and practice good posture.  You may continue to have sex unless your health care provider tells you not to. Relieving pain and discomfort  Wear a good support bra to relieve breast tenderness.  Take warm sitz baths to soothe any pain or discomfort caused by hemorrhoids. Use hemorrhoid cream if your health care provider approves.  Rest with your legs elevated if you have leg cramps or low back pain.  If you develop   varicose veins in your legs, wear support hose. Elevate your feet for 15 minutes, 3-4 times a day. Limit salt in your diet. Prenatal care  Schedule your prenatal visits by the twelfth week of pregnancy. They are usually scheduled monthly at first, then more often in the last 2 months before delivery.  Write down your questions. Take them to your prenatal visits.  Keep all your prenatal visits as told by your health care provider. This is important. Safety  Wear your seat belt at all times when driving.  Make a list of emergency phone numbers, including numbers for family, friends, the hospital, and police and fire departments. General instructions  Ask your health care provider for a referral to a local prenatal education class. Begin classes no later than the beginning of month 6 of your pregnancy.  Ask for help if you have counseling or nutritional needs during pregnancy. Your health care provider can offer advice or refer you to specialists for help  with various needs.  Do not use hot tubs, steam rooms, or saunas.  Do not douche or use tampons or scented sanitary pads.  Do not cross your legs for long periods of time.  Avoid cat litter boxes and soil used by cats. These carry germs that can cause birth defects in the baby and possibly loss of the fetus by miscarriage or stillbirth.  Avoid all smoking, herbs, alcohol, and medicines not prescribed by your health care provider. Chemicals in these products affect the formation and growth of the baby.  Do not use any products that contain nicotine or tobacco, such as cigarettes and e-cigarettes. If you need help quitting, ask your health care provider. You may receive counseling support and other resources to help you quit.  Schedule a dentist appointment. At home, brush your teeth with a soft toothbrush and be gentle when you floss. Contact a health care provider if:  You have dizziness.  You have mild pelvic cramps, pelvic pressure, or nagging pain in the abdominal area.  You have persistent nausea, vomiting, or diarrhea.  You have a bad smelling vaginal discharge.  You have pain when you urinate.  You notice increased swelling in your face, hands, legs, or ankles.  You are exposed to fifth disease or chickenpox.  You are exposed to German measles (rubella) and have never had it. Get help right away if:  You have a fever.  You are leaking fluid from your vagina.  You have spotting or bleeding from your vagina.  You have severe abdominal cramping or pain.  You have rapid weight gain or loss.  You vomit blood or material that looks like coffee grounds.  You develop a severe headache.  You have shortness of breath.  You have any kind of trauma, such as from a fall or a car accident. Summary  The first trimester of pregnancy is from week 1 until the end of week 13 (months 1 through 3).  Your body goes through many changes during pregnancy. The changes vary from  woman to woman.  You will have routine prenatal visits. During those visits, your health care provider will examine you, discuss any test results you may have, and talk with you about how you are feeling. This information is not intended to replace advice given to you by your health care provider. Make sure you discuss any questions you have with your health care provider. Document Released: 06/10/2001 Document Revised: 05/28/2016 Document Reviewed: 05/28/2016 [employer] Interactive Patient Education  2017 [employer]  

## 2016-09-20 LAB — RPR: RPR: NONREACTIVE

## 2016-09-20 LAB — HIV ANTIBODY (ROUTINE TESTING W REFLEX): HIV Screen 4th Generation wRfx: NONREACTIVE

## 2016-09-22 LAB — GC/CHLAMYDIA PROBE AMP (~~LOC~~) NOT AT ARMC
Chlamydia: NEGATIVE
NEISSERIA GONORRHEA: NEGATIVE

## 2017-03-25 ENCOUNTER — Inpatient Hospital Stay (HOSPITAL_COMMUNITY)
Admission: AD | Admit: 2017-03-25 | Discharge: 2017-03-31 | DRG: 783 | Disposition: A | Discharge status: Home / Self Care | Payer: Medicaid Other | Source: Ambulatory Visit | Attending: Obstetrics and Gynecology | Admitting: Obstetrics and Gynecology

## 2017-03-25 ENCOUNTER — Inpatient Hospital Stay (HOSPITAL_COMMUNITY): Payer: Medicaid Other

## 2017-03-25 ENCOUNTER — Encounter (HOSPITAL_COMMUNITY): Payer: Self-pay | Admitting: *Deleted

## 2017-03-25 DIAGNOSIS — O99324 Drug use complicating childbirth: Secondary | ICD-10-CM | POA: Diagnosis present

## 2017-03-25 DIAGNOSIS — R1110 Vomiting, unspecified: Secondary | ICD-10-CM | POA: Diagnosis not present

## 2017-03-25 DIAGNOSIS — K831 Obstruction of bile duct: Secondary | ICD-10-CM | POA: Diagnosis present

## 2017-03-25 DIAGNOSIS — Z90710 Acquired absence of both cervix and uterus: Secondary | ICD-10-CM | POA: Diagnosis not present

## 2017-03-25 DIAGNOSIS — O9832 Other infections with a predominantly sexual mode of transmission complicating childbirth: Secondary | ICD-10-CM | POA: Diagnosis present

## 2017-03-25 DIAGNOSIS — Z91013 Allergy to seafood: Secondary | ICD-10-CM | POA: Diagnosis not present

## 2017-03-25 DIAGNOSIS — Z3A33 33 weeks gestation of pregnancy: Secondary | ICD-10-CM | POA: Diagnosis not present

## 2017-03-25 DIAGNOSIS — O2662 Liver and biliary tract disorders in childbirth: Secondary | ICD-10-CM | POA: Diagnosis present

## 2017-03-25 DIAGNOSIS — A6000 Herpesviral infection of urogenital system, unspecified: Secondary | ICD-10-CM | POA: Diagnosis present

## 2017-03-25 DIAGNOSIS — O26613 Liver and biliary tract disorders in pregnancy, third trimester: Secondary | ICD-10-CM

## 2017-03-25 DIAGNOSIS — Z87891 Personal history of nicotine dependence: Secondary | ICD-10-CM

## 2017-03-25 DIAGNOSIS — O26899 Other specified pregnancy related conditions, unspecified trimester: Secondary | ICD-10-CM

## 2017-03-25 DIAGNOSIS — O99824 Streptococcus B carrier state complicating childbirth: Secondary | ICD-10-CM | POA: Diagnosis present

## 2017-03-25 DIAGNOSIS — Z560 Unemployment, unspecified: Secondary | ICD-10-CM | POA: Diagnosis not present

## 2017-03-25 DIAGNOSIS — O0933 Supervision of pregnancy with insufficient antenatal care, third trimester: Secondary | ICD-10-CM | POA: Diagnosis not present

## 2017-03-25 DIAGNOSIS — F1110 Opioid abuse, uncomplicated: Secondary | ICD-10-CM | POA: Diagnosis present

## 2017-03-25 DIAGNOSIS — F1120 Opioid dependence, uncomplicated: Secondary | ICD-10-CM

## 2017-03-25 DIAGNOSIS — Z880 Allergy status to penicillin: Secondary | ICD-10-CM | POA: Diagnosis not present

## 2017-03-25 DIAGNOSIS — R109 Unspecified abdominal pain: Secondary | ICD-10-CM

## 2017-03-25 DIAGNOSIS — O9952 Diseases of the respiratory system complicating childbirth: Secondary | ICD-10-CM | POA: Diagnosis present

## 2017-03-25 DIAGNOSIS — R110 Nausea: Secondary | ICD-10-CM | POA: Diagnosis not present

## 2017-03-25 DIAGNOSIS — Z302 Encounter for sterilization: Secondary | ICD-10-CM

## 2017-03-25 DIAGNOSIS — O368390 Maternal care for abnormalities of the fetal heart rate or rhythm, unspecified trimester, not applicable or unspecified: Secondary | ICD-10-CM

## 2017-03-25 DIAGNOSIS — O26893 Other specified pregnancy related conditions, third trimester: Secondary | ICD-10-CM | POA: Diagnosis present

## 2017-03-25 DIAGNOSIS — IMO0002 Reserved for concepts with insufficient information to code with codable children: Secondary | ICD-10-CM

## 2017-03-25 DIAGNOSIS — Z9889 Other specified postprocedural states: Secondary | ICD-10-CM | POA: Diagnosis not present

## 2017-03-25 DIAGNOSIS — O26643 Intrahepatic cholestasis of pregnancy, third trimester: Secondary | ICD-10-CM | POA: Diagnosis present

## 2017-03-25 DIAGNOSIS — J45909 Unspecified asthma, uncomplicated: Secondary | ICD-10-CM | POA: Diagnosis present

## 2017-03-25 DIAGNOSIS — O9989 Other specified diseases and conditions complicating pregnancy, childbirth and the puerperium: Secondary | ICD-10-CM | POA: Diagnosis not present

## 2017-03-25 DIAGNOSIS — F1123 Opioid dependence with withdrawal: Secondary | ICD-10-CM | POA: Diagnosis present

## 2017-03-25 LAB — CBC
HEMATOCRIT: 35.2 % — AB (ref 36.0–46.0)
HEMOGLOBIN: 12.6 g/dL (ref 12.0–15.0)
MCH: 31.7 pg (ref 26.0–34.0)
MCHC: 35.8 g/dL (ref 30.0–36.0)
MCV: 88.7 fL (ref 78.0–100.0)
Platelets: 227 10*3/uL (ref 150–400)
RBC: 3.97 MIL/uL (ref 3.87–5.11)
RDW: 14 % (ref 11.5–15.5)
WBC: 11.8 10*3/uL — AB (ref 4.0–10.5)

## 2017-03-25 LAB — URINALYSIS, ROUTINE W REFLEX MICROSCOPIC
Glucose, UA: NEGATIVE mg/dL
Hgb urine dipstick: NEGATIVE
LEUKOCYTES UA: NEGATIVE
Nitrite: NEGATIVE
PH: 6 (ref 5.0–8.0)
Protein, ur: 30 mg/dL — AB
Specific Gravity, Urine: 1.025 (ref 1.005–1.030)

## 2017-03-25 LAB — DIFFERENTIAL
BASOS ABS: 0 10*3/uL (ref 0.0–0.1)
Basophils Relative: 0 %
Eosinophils Absolute: 0 10*3/uL (ref 0.0–0.7)
Eosinophils Relative: 0 %
LYMPHS ABS: 1.1 10*3/uL (ref 0.7–4.0)
LYMPHS PCT: 9 %
MONOS PCT: 1 %
Monocytes Absolute: 0.1 10*3/uL (ref 0.1–1.0)
NEUTROS ABS: 10.6 10*3/uL — AB (ref 1.7–7.7)
Neutrophils Relative %: 90 %

## 2017-03-25 LAB — HEPATITIS B SURFACE ANTIGEN: HEP B S AG: NEGATIVE

## 2017-03-25 LAB — COMPREHENSIVE METABOLIC PANEL
ALK PHOS: 318 U/L — AB (ref 38–126)
ALT: 62 U/L — AB (ref 14–54)
AST: 45 U/L — ABNORMAL HIGH (ref 15–41)
Albumin: 2.6 g/dL — ABNORMAL LOW (ref 3.5–5.0)
Anion gap: 15 (ref 5–15)
BUN: 8 mg/dL (ref 6–20)
CALCIUM: 8.7 mg/dL — AB (ref 8.9–10.3)
CO2: 19 mmol/L — AB (ref 22–32)
CREATININE: 0.45 mg/dL (ref 0.44–1.00)
Chloride: 100 mmol/L — ABNORMAL LOW (ref 101–111)
Glucose, Bld: 95 mg/dL (ref 65–99)
Potassium: 3.6 mmol/L (ref 3.5–5.1)
Sodium: 134 mmol/L — ABNORMAL LOW (ref 135–145)
Total Bilirubin: 2.2 mg/dL — ABNORMAL HIGH (ref 0.3–1.2)
Total Protein: 6.8 g/dL (ref 6.5–8.1)

## 2017-03-25 LAB — URINALYSIS, MICROSCOPIC (REFLEX)

## 2017-03-25 LAB — LIPASE, BLOOD: Lipase: 17 U/L (ref 11–51)

## 2017-03-25 LAB — FETAL FIBRONECTIN: Fetal Fibronectin: POSITIVE — AB

## 2017-03-25 LAB — RAPID URINE DRUG SCREEN, HOSP PERFORMED
AMPHETAMINES: NOT DETECTED
BENZODIAZEPINES: NOT DETECTED
Barbiturates: NOT DETECTED
Cocaine: NOT DETECTED
OPIATES: POSITIVE — AB
Tetrahydrocannabinol: NOT DETECTED

## 2017-03-25 LAB — TYPE AND SCREEN
ABO/RH(D): O NEG
Antibody Screen: NEGATIVE

## 2017-03-25 LAB — WET PREP, GENITAL
Sperm: NONE SEEN
Trich, Wet Prep: NONE SEEN
Yeast Wet Prep HPF POC: NONE SEEN

## 2017-03-25 LAB — GROUP B STREP BY PCR: Group B strep by PCR: POSITIVE — AB

## 2017-03-25 LAB — AMYLASE: AMYLASE: 38 U/L (ref 28–100)

## 2017-03-25 MED ORDER — LACTATED RINGERS IV BOLUS (SEPSIS)
1000.0000 mL | Freq: Once | INTRAVENOUS | Status: AC
Start: 2017-03-25 — End: 2017-03-25
  Administered 2017-03-25: 1000 mL via INTRAVENOUS

## 2017-03-25 MED ORDER — ONDANSETRON HCL 4 MG/2ML IJ SOLN
4.0000 mg | Freq: Once | INTRAMUSCULAR | Status: AC
  Administered 2017-03-25: 4 mg via INTRAVENOUS
  Filled 2017-03-25: qty 2

## 2017-03-25 MED ORDER — ACETAMINOPHEN 325 MG PO TABS
650.0000 mg | ORAL_TABLET | ORAL | Status: DC | PRN
  Administered 2017-03-26 (×2): 650 mg via ORAL
  Filled 2017-03-25 (×2): qty 2

## 2017-03-25 MED ORDER — NIFEDIPINE 10 MG PO CAPS: 10.0000 mg | ORAL_CAPSULE | ORAL | Status: AC

## 2017-03-25 MED ORDER — BETAMETHASONE SOD PHOS & ACET 6 (3-3) MG/ML IJ SUSP
12.0000 mg | Freq: Once | INTRAMUSCULAR | Status: AC
  Administered 2017-03-25: 12 mg via INTRAMUSCULAR
  Filled 2017-03-25: qty 2

## 2017-03-25 MED ORDER — LACTATED RINGERS IV SOLN
INTRAVENOUS | Status: DC
  Administered 2017-03-25 – 2017-03-26 (×3): via INTRAVENOUS

## 2017-03-25 MED ORDER — DOCUSATE SODIUM 100 MG PO CAPS
100.0000 mg | ORAL_CAPSULE | Freq: Every day | ORAL | Status: DC
Start: 2017-03-26 — End: 2017-03-27
  Administered 2017-03-27: 100 mg via ORAL
  Filled 2017-03-25 (×2): qty 1

## 2017-03-25 MED ORDER — NIFEDIPINE 10 MG PO CAPS
10.0000 mg | ORAL_CAPSULE | Freq: Once | ORAL | Status: AC
  Administered 2017-03-25: 10 mg via ORAL
  Filled 2017-03-25: qty 1

## 2017-03-25 MED ORDER — LACTATED RINGERS IV SOLN
INTRAVENOUS | Status: DC
  Administered 2017-03-25: 22:00:00 via INTRAVENOUS

## 2017-03-25 MED ORDER — PRENATAL MULTIVITAMIN CH
1.0000 | ORAL_TABLET | Freq: Every day | ORAL | Status: DC
  Filled 2017-03-25: qty 1

## 2017-03-25 MED ORDER — CALCIUM CARBONATE ANTACID 500 MG PO CHEW
2.0000 | CHEWABLE_TABLET | ORAL | Status: DC | PRN
  Administered 2017-03-26 – 2017-03-27 (×2): 400 mg via ORAL
  Filled 2017-03-25 (×2): qty 2

## 2017-03-25 MED ORDER — RHO D IMMUNE GLOBULIN 1500 UNIT/2ML IJ SOSY
300.0000 ug | PREFILLED_SYRINGE | Freq: Once | INTRAMUSCULAR | Status: AC
  Administered 2017-03-25: 300 ug via INTRAMUSCULAR
  Filled 2017-03-25: qty 2

## 2017-03-25 MED ORDER — METHADONE HCL 10 MG PO TABS
30.0000 mg | ORAL_TABLET | Freq: Once | ORAL | Status: AC
  Administered 2017-03-25: 30 mg via ORAL
  Filled 2017-03-25: qty 3

## 2017-03-25 NOTE — MAU Provider Note (Signed)
History     CSN: [id number]  Arrival date and time: 03/25/17 1842   First Provider Initiated Contact with Patient 03/25/17 2003     CC: Pain  HPI   [name] is a 26 y.o. female [phone] @ 75w1dwith no prenatal care here with abdominal pain and N/V.  States she has been having contractions all night along with N/V, states she contractions have gotten stronger. States she feels like she is in labor. States she thought she had a miscarriage and did not realize she was still pregnant until she "popped" last week. States since the contractions started she has felt numbness all over her body from her head all the way down to her legs. One side is not greater than the other. She denies difficulty walking. She has never had this sensation before.  Denies vaginal bleeding.  Patient has an appointment with Dr. HPhilis Piqueon Monday.  Patient declines drug history,.   OB History    Gravida Para Term Preterm AB Living   _0 0 0 3   SAB TAB Ectopic Multiple Live Births   0 0 0 0 3      Past Medical History:  Diagnosis Date  . Asthma   . Headache in pregnancy   . Herpes simplex of female genitalia    last outbreak 4 months ago  . Migraine   . Migraines     Past Surgical History:  Procedure Laterality Date  . CHOLECYSTECTOMY N/A 01/17/2015   Procedure: LAPAROSCOPIC CHOLECYSTECTOMY WITH INTRAOPERATIVE CHOLANGIOGRAM;  Surgeon: BExcell Seltzer MD;  Location: [facility];  Service: General;  Laterality: N/A;  . COLPOSCOPY  03/2014    Family History  Problem Relation Age of Onset  . Asthma Mother   . Asthma Sister   . Asthma Sister     Social History  Substance Use Topics  . Smoking status: Former Smoker    Packs/day: 0.25    Years: 2.00    Types: Cigarettes    Quit date: 05/02/2013  . Smokeless tobacco: Never Used  . Alcohol use No     Comment: socially    Allergies:  Allergies  Allergen Reactions  . Shellfish Allergy Shortness Of Breath and Swelling    Tongue  . Other      Tylenol #3---Swelling   . Penicillins Itching, Swelling and Rash    Sweating, Has patient had a PCN reaction causing immediate rash, facial/tongue/throat swelling, SOB or lightheadedness with hypotension: NO Has patient had a PCN reaction causing severe rash involving mucus membranes or skin necrosis:NO Has patient had a PCN reaction that required hospitalization NO Has patient had a PCN reaction occurring within the last 10 years:NO If all of the above answers are "NO", then may proceed with Cephalosporin use.      Prescriptions Prior to Admission  Medication Sig Dispense Refill Last Dose  . albuterol (PROVENTIL HFA;VENTOLIN HFA) 108 (90 BASE) MCG/ACT inhaler Inhale 2 puffs into the lungs every 6 (six) hours as needed for wheezing or shortness of breath.   Unknown at Unknown time  . ALL DAY ALLERGY-D 5-120 MG tablet Take 1 tablet by mouth 2 (two) times daily as needed for allergies.   2 Unknown at Unknown time  . neomycin-polymyxin-hydrocortisone (CORTISPORIN) 3.5-10000-1 otic suspension Place 3 drops into the right ear 3 (three) times daily. 10 mL 0 Unknown at Unknown time  . traMADol (ULTRAM) 50 MG tablet Take 1 tablet (50 mg total) by mouth every 6 (six) hours as  needed. 10 tablet 0 Unknown at Unknown time   Results for orders placed or performed during the hospital encounter of 03/25/17 (from the past 48 hour(s))  Wet prep, genital     Status: Abnormal   Collection Time: 03/25/17  8:00 PM  Result Value Ref Range   Yeast Wet Prep HPF POC NONE SEEN NONE SEEN   Trich, Wet Prep NONE SEEN NONE SEEN   Clue Cells Wet Prep HPF POC PRESENT (A) NONE SEEN   WBC, Wet Prep HPF POC FEW (A) NONE SEEN    Comment: MODERATE BACTERIA SEEN   Sperm NONE SEEN   Fetal fibronectin     Status: Abnormal   Collection Time: 03/25/17  8:00 PM  Result Value Ref Range   Fetal Fibronectin POSITIVE (A) NEGATIVE  CBC     Status: Abnormal   Collection Time: 03/25/17  8:20 PM  Result Value Ref Range   WBC  11.8 (H) 4.0 - 10.5 K/uL   RBC 3.97 3.87 - 5.11 MIL/uL   Hemoglobin 12.6 12.0 - 15.0 g/dL   HCT 35.2 (L) 36.0 - 46.0 %   MCV 88.7 78.0 - 100.0 fL   MCH 31.7 26.0 - 34.0 pg   MCHC 35.8 30.0 - 36.0 g/dL   RDW 14.0 11.5 - 15.5 %   Platelets 227 150 - 400 K/uL  Differential     Status: Abnormal   Collection Time: 03/25/17  8:20 PM  Result Value Ref Range   Neutrophils Relative % 90 %   Neutro Abs 10.6 (H) 1.7 - 7.7 K/uL   Lymphocytes Relative 9 %   Lymphs Abs 1.1 0.7 - 4.0 K/uL   Monocytes Relative 1 %   Monocytes Absolute 0.1 0.1 - 1.0 K/uL   Eosinophils Relative 0 %   Eosinophils Absolute 0.0 0.0 - 0.7 K/uL   Basophils Relative 0 %   Basophils Absolute 0.0 0.0 - 0.1 K/uL  Comprehensive metabolic panel     Status: Abnormal   Collection Time: 03/25/17  8:20 PM  Result Value Ref Range   Sodium 134 (L) 135 - 145 mmol/L   Potassium 3.6 3.5 - 5.1 mmol/L   Chloride 100 (L) 101 - 111 mmol/L   CO2 19 (L) 22 - 32 mmol/L   Glucose, Bld 95 65 - 99 mg/dL   BUN 8 6 - 20 mg/dL   Creatinine, Ser 0.45 0.44 - 1.00 mg/dL   Calcium 8.7 (L) 8.9 - 10.3 mg/dL   Total Protein 6.8 6.5 - 8.1 g/dL   Albumin 2.6 (L) 3.5 - 5.0 g/dL   AST 45 (H) 15 - 41 U/L   ALT 62 (H) 14 - 54 U/L   Alkaline Phosphatase 318 (H) 38 - 126 U/L   Total Bilirubin 2.2 (H) 0.3 - 1.2 mg/dL   GFR calc non Af Amer >60 >60 mL/min   GFR calc Af Amer >60 >60 mL/min    Comment: (NOTE) The eGFR has been calculated using the CKD EPI equation. This calculation has not been validated in all clinical situations. eGFR's persistently <60 mL/min signify possible Chronic Kidney Disease.    Anion gap 15 5 - 15    Review of Systems  Constitutional: Negative for activity change.  Gastrointestinal: Positive for abdominal pain.  Genitourinary: Negative for vaginal bleeding and vaginal discharge.  Neurological: Positive for weakness. Negative for facial asymmetry.   Physical Exam   Blood pressure (!) 107/51, pulse 97, resp. rate 20,  height _0  (1.549 m), weight 145 lb  12 oz (66.1 kg), last menstrual period 08/05/2016, unknown if currently breastfeeding.  Physical Exam  Constitutional: She is oriented to person, place, and time. She appears well-developed and well-nourished. She appears distressed.  HENT:  Head: Normocephalic.  GI: Soft.  Moderate contractions palpated; resting uterine tone between contractions.   Genitourinary:  Genitourinary Comments: Dilation: 1 Effacement (%): 70 Station: -2 Presentation: Vertex Exam by:: Noni Saupe NP  Wet prep, GC, and fetal fibronectin collected   Musculoskeletal: Normal range of motion.  Neurological: She is alert and oriented to person, place, and time.  Skin: Skin is warm. She is not diaphoretic.  Psychiatric: Her behavior is normal.   Cervical Recheck: Dilation: 1.0 Effacement (%): 70 Station: -2 Presentation: Vertex Exam by: Sunday Corn, MSN, CNM  Fetal Tracing: Baseline: 120 bpm Variability: Moderate  Accelerations: 15x15 Decelerations: None Toco: Occasional Q3 when tracing.   MAU Course  Procedures  None  MDM  Wet prep & GC GBS collected Fetal fibronectin positive- betamethasone give Prenatal panel with HIV Bile acids- history of cholestasis  Procardia X 1 dose with LR bolus Discussed patient with Dr. Rogue Bussing, limited US ordered.  UDS & UA ordered  O negative blood type; rhogam workup initiated  Report given to [place] who resumes care of the patient  Rasch, Artist Pais, NP   Assessment and Plan  Preterm contractions - Admit to HR OB unit - Routine Antenatal orders - Clear liquid diet  Opioid Drug Withdrawal - Methadone per pharmacy protocol  Care assumed by Dr. Rogue Bussing upon admission - see her H&P note  Laury Deep, CNM 03/25/2017 11:15 PM

## 2017-03-25 NOTE — H&P (Signed)
26 y.o. [redacted]w[redacted]d  [id number] comes in c/o vomiting today 5 times and contractions starting yesterday. She also reports "numbness" throughout her whole body.  She denies any motor deficits and states she feels touch throughout her body but it is a vague sensation she doesn't know how to fully describe.  She states she has been taking percocet 3 times per day, which was prescribed by her PCP for backpain.  She states she has consistently been taking 90 per month until last week when she realized she was pregnant and stopped abruptly. She denies taking any add'l non-prescribed medication. She states her other 3 children are living with with her sister in [place] d/t the the recent social issues related to the pregnancy.  She states that she thought she had a miscarriage in march d/t heavy bleeding and has had monthly "periods" since, so never suspected she was pregnant.  She is currently living with a friend.  She denies any IV drug use or alcohol use.  She state the friend she lives with does not use any drugs. She denies bleeding or LOF.  She reports FM.  She reports hives to PCN.  She denies any HSV symptoms or recent outbreaks.  She states she had mildly elevated LFTs last pregnancy.  Pt reports currently feeling 3 contractions per hour.  Past Medical History:  Diagnosis Date  . Asthma   . Headache in pregnancy   . Herpes simplex of female genitalia    last outbreak 4 months ago  . Migraine   . Migraines     Past Surgical History:  Procedure Laterality Date  . CHOLECYSTECTOMY N/A 01/17/2015   Procedure: LAPAROSCOPIC CHOLECYSTECTOMY WITH INTRAOPERATIVE CHOLANGIOGRAM;  Surgeon: Excell Seltzer, MD;  Location: [facility];  Service: General;  Laterality: N/A;  . COLPOSCOPY  03/2014    OB History  Gravida Para Term Preterm AB Living  4 3 3  0 0 3  SAB TAB Ectopic Multiple Live Births  0 0 0 0 3    # Outcome Date GA Lbr Len/2nd Weight Sex Delivery Anes PTL Lv  4 Current           3 Term 01/03/15 [redacted]w[redacted]d 08:48 /  00:06 2.512 kg (5 lb 8.6 oz) F Vag-Spont EPI  LIV  2 Term 2009 [redacted]w[redacted]d  3.175 kg (7 lb)  Vag-Spont   LIV  1 Term 2007 [redacted]w[redacted]d  3.572 kg (7 lb 14 oz)  Vag-Spont   LIV      Social History   Social History  . Marital status: Single    Spouse name: N/A  . Number of children: 2  . Years of education: 8 th   Occupational History  .  Unemployed   Social History Main Topics  . Smoking status: Former Smoker    Packs/day: 0.25    Years: 2.00    Types: Cigarettes    Quit date: 05/02/2013  . Smokeless tobacco: Never Used  . Alcohol use No     Comment: socially  . Drug use: No  . Sexual activity: Yes    Birth control/ protection: None   Other Topics Concern  . Not on file   Social History Narrative   Patient is single and lives at home with her family.   Patient is unemployed.   Education 8 th grade.   Right handed.   Caffeine one cup of coffee daily.   Shellfish allergy; Other; and Penicillins    Prenatal Transfer Tool  Maternal Diabetes: unknown Genetic Screening: no  prenatal care Maternal Ultrasounds/Referrals: pending -ordered for am Fetal Ultrasounds or other Referrals:  None Maternal Substance Abuse:  Yes:  Type: Prescription drugs, Other: Percocet Significant Maternal Medications:  None other than above Significant Maternal Lab Results: Lab values include: Group B Strep positive  Other PNC: no care until this admission    Vitals:   03/25/17 1932 03/25/17 2036  BP: 124/67 (!) 107/51  Pulse: 97   Resp: 20     Per MAU: Gen: agitated Lungs/Cor:  NAD Abdomen:  soft, gravid Ex:  no cords, erythema SVE:  1/70/-2 FHTs:  135, good STV, NST R occ variable Toco:  Difficult to trace, irregular 2-6   A/P   Admit to antenatal for monitoring of suspected PTL in setting of no prenatal care S/p procardia 10mg  x 1 dose, no add'l doses given low BP  Cervix currently unchanged - if cervix changes will start vancomycin for GBS prophylaxis  GBS Pos  Discussed +UDS and  presenting symptoms c/w narcotic withdrawal with Dr. Elonda Husky who recommended starting 40mg  methadone and reassess in 8-12 hours for add'l dose if still agitated.  This recommendation discussed with patient and she agree with this plan.  Social work consult  Elevated LFTs- Hep C added to labs: pending  Beta x 1 given, repeat dose in 24 hrs  [place] tech refused to perform scan tonight given pt being combative per MAU staff, will start methadone and order scan for tomorrow to confirm dating.  6week2d, +FHT [place] giving EDD of 05/12/2017.  Vtx. Clear liquids Cont. EFW/TOCO HSV: pt asymptomatic, no lesion on exam  Katrina [name]

## 2017-03-25 NOTE — MAU Note (Signed)
Pt took her monitors off at this time. Pt refusing to wear it

## 2017-03-25 NOTE — MAU Note (Signed)
Patient presents to MAU with c/o vomiting x5 and not being able to feel face, legs and arms. States this started this morning unknown time. States she is also have contractions since this morning as well. [facility]- states she goes to [facility] but has not been to them since March 2018. Denies VB, LOF, or vaginal discharge.

## 2017-03-26 ENCOUNTER — Observation Stay (HOSPITAL_COMMUNITY): Payer: Medicaid Other

## 2017-03-26 DIAGNOSIS — F1120 Opioid dependence, uncomplicated: Secondary | ICD-10-CM

## 2017-03-26 DIAGNOSIS — R110 Nausea: Secondary | ICD-10-CM

## 2017-03-26 DIAGNOSIS — Z87891 Personal history of nicotine dependence: Secondary | ICD-10-CM

## 2017-03-26 DIAGNOSIS — R1110 Vomiting, unspecified: Secondary | ICD-10-CM

## 2017-03-26 DIAGNOSIS — Z560 Unemployment, unspecified: Secondary | ICD-10-CM

## 2017-03-26 DIAGNOSIS — Z3A33 33 weeks gestation of pregnancy: Secondary | ICD-10-CM

## 2017-03-26 DIAGNOSIS — O0933 Supervision of pregnancy with insufficient antenatal care, third trimester: Secondary | ICD-10-CM

## 2017-03-26 LAB — RH IG WORKUP (INCLUDES ABO/RH)
ABO/RH(D): O NEG
Fetal Screen: NEGATIVE
GESTATIONAL AGE(WKS): 33
UNIT DIVISION: 0

## 2017-03-26 LAB — GC/CHLAMYDIA PROBE AMP (~~LOC~~) NOT AT ARMC
Chlamydia: NEGATIVE
Neisseria Gonorrhea: NEGATIVE

## 2017-03-26 LAB — RPR: RPR: NONREACTIVE

## 2017-03-26 LAB — RUBELLA SCREEN

## 2017-03-26 LAB — HIV ANTIBODY (ROUTINE TESTING W REFLEX): HIV Screen 4th Generation wRfx: NONREACTIVE

## 2017-03-26 MED ORDER — BETAMETHASONE SOD PHOS & ACET 6 (3-3) MG/ML IJ SUSP
12.0000 mg | Freq: Once | INTRAMUSCULAR | Status: AC
  Administered 2017-03-26: 12 mg via INTRAMUSCULAR
  Filled 2017-03-26: qty 2

## 2017-03-26 MED ORDER — ONDANSETRON 8 MG PO TBDP
8.0000 mg | ORAL_TABLET | Freq: Three times a day (TID) | ORAL | Status: DC | PRN
  Administered 2017-03-26: 8 mg via ORAL
  Filled 2017-03-26: qty 1
  Filled 2017-03-26: qty 2

## 2017-03-26 MED ORDER — METHADONE HCL 10 MG PO TABS
10.0000 mg | ORAL_TABLET | Freq: Once | ORAL | Status: AC
  Administered 2017-03-26: 10 mg via ORAL
  Filled 2017-03-26: qty 1

## 2017-03-26 MED ORDER — METHADONE HCL 10 MG PO TABS
10.0000 mg | ORAL_TABLET | ORAL | Status: DC
  Administered 2017-03-27 – 2017-03-29 (×6): 10 mg via ORAL
  Filled 2017-03-26 (×7): qty 1

## 2017-03-26 MED ORDER — CLONIDINE HCL 0.1 MG PO TABS
0.1000 mg | ORAL_TABLET | ORAL | Status: AC
  Administered 2017-03-28 – 2017-03-29 (×4): 0.1 mg via ORAL
  Filled 2017-03-26 (×4): qty 1

## 2017-03-26 MED ORDER — DICYCLOMINE HCL 10 MG PO CAPS
20.0000 mg | ORAL_CAPSULE | Freq: Four times a day (QID) | ORAL | Status: DC | PRN
  Filled 2017-03-26: qty 2

## 2017-03-26 MED ORDER — METHADONE HCL 10 MG PO TABS
20.0000 mg | ORAL_TABLET | ORAL | Status: DC
  Administered 2017-03-27 – 2017-03-29 (×3): 20 mg via ORAL
  Filled 2017-03-26 (×3): qty 2

## 2017-03-26 MED ORDER — HYDROXYZINE HCL 25 MG PO TABS
25.0000 mg | ORAL_TABLET | Freq: Four times a day (QID) | ORAL | Status: DC | PRN
  Administered 2017-03-26 – 2017-03-31 (×3): 25 mg via ORAL
  Filled 2017-03-26 (×5): qty 1

## 2017-03-26 MED ORDER — DICYCLOMINE HCL 20 MG PO TABS
20.0000 mg | ORAL_TABLET | Freq: Four times a day (QID) | ORAL | Status: DC | PRN
  Administered 2017-03-26 (×2): 20 mg via ORAL
  Filled 2017-03-26 (×3): qty 1

## 2017-03-26 MED ORDER — VALACYCLOVIR HCL 500 MG PO TABS
500.0000 mg | ORAL_TABLET | Freq: Every day | ORAL | Status: DC
  Administered 2017-03-26 – 2017-03-27 (×3): 500 mg via ORAL
  Filled 2017-03-26 (×4): qty 1

## 2017-03-26 MED ORDER — FAMOTIDINE 20 MG PO TABS
40.0000 mg | ORAL_TABLET | Freq: Two times a day (BID) | ORAL | Status: DC | PRN
  Administered 2017-03-26: 40 mg via ORAL
  Filled 2017-03-26: qty 2

## 2017-03-26 MED ORDER — CLONIDINE HCL 0.1 MG PO TABS
0.1000 mg | ORAL_TABLET | Freq: Every day | ORAL | Status: AC
  Administered 2017-03-30 – 2017-03-31 (×2): 0.1 mg via ORAL
  Filled 2017-03-26 (×2): qty 1

## 2017-03-26 MED ORDER — CLONIDINE HCL 0.1 MG PO TABS
0.1000 mg | ORAL_TABLET | Freq: Four times a day (QID) | ORAL | Status: AC
  Administered 2017-03-26 – 2017-03-27 (×7): 0.1 mg via ORAL
  Filled 2017-03-26 (×8): qty 1

## 2017-03-26 MED ORDER — LOPERAMIDE HCL 2 MG PO CAPS
2.0000 mg | ORAL_CAPSULE | ORAL | Status: DC | PRN
  Filled 2017-03-26: qty 2

## 2017-03-26 MED ORDER — LACTATED RINGERS IV BOLUS (SEPSIS)
1000.0000 mL | Freq: Once | INTRAVENOUS | Status: AC
  Administered 2017-03-26: 1000 mL via INTRAVENOUS

## 2017-03-26 NOTE — Progress Notes (Signed)
Went in to meet patient and to assess her with Katrina [name] Niece RN however patient was sound asleep with visitor also asleep on the couch. Pt refusing fetal monitoring at this time and wants to sleep. Dr. Rogue Bussing aware of pt refusal to be monitored.

## 2017-03-26 NOTE — Progress Notes (Signed)
Pt admitted overnight with opiate abuse and symptoms of withdrawal.  She reports being on pills for " a long time"-- greater than 1 year.  She is taking 30mg  percocet daily.  Reports a prescription for #90 10mg  percocet q month from her PCP.   Reported last night that she has not taken any pills in >1 week.  Today, she admits to last use 1-2d ago.  Denied IVDA, alcohol abuse or any other substances.  UDS positive for only opiates.  Her friend admitted to the nurse that the patient has been using heroin for the past two weeks.  Endorses nausea, vomiting, diarrhea, restlessness, body aches. Pt is visibly agitated on exam.  She reports that she was unaware that she was pregnant until 1 week ago.    BP (!) 104/50   Pulse 97   Temp 98.5 F (36.9 C) (Oral)   Resp 20   Ht 5\' 1"  (1.549 m)   Wt 66.1 kg (145 lb 12 oz)   LMP 08/05/2016   Breastfeeding? Unknown   BMI 27.54 kg/m   Restless, agitated Pupils dilated, face sweaty Abd:  Soft, gravid.  No contractions palpated.  SVE: 1/70/-3/posterior.    A&P:  g4p3 @ [redacted]w[redacted]d by LMP with opiate abuse, withdrawal Does desire to transition to maintenance therapy.   SW in touch with outpatient program, has tentative placement early next week.   Spoke with MFM this AM and was advised to contact psychiatry for management of maintenance therapy.  Pt will need formal dating US / anatomy scan when condition stabilizes.  Psych consult placed this AM for initiation / titration of methadone therapy and management of acute withdrawal.  Second call placed at 1100 AM.  At this time, continues to refuse fetal monitoring.  FHTs via intermittent auscultation wnl

## 2017-03-26 NOTE — Consult Note (Signed)
[facility] Consult   Reason for Consult:  Opiate withdrawal at [redacted] weeks pregnant Referring Physician:  Dr. Carlis Abbott Patient Identification: Katrina [name] MRN:  [id number] Principal Diagnosis: Opiate dependence [facility]) Diagnosis:   Patient Active Problem List   Diagnosis Date Noted  . Opiate dependence ([place]) [F11.20] 03/26/2017  . No prenatal care in current pregnancy in third trimester [O09.33] 03/25/2017  . Right knee pain [M25.561] 09/27/2015  . Biliary colic [X32.44] 07/02/7251  . Recurrent biliary colic [G64.40] 34/74/2595  . Cholestasis of pregnancy [O26.619, K83.1] 01/03/2015  . [redacted] weeks gestation of pregnancy [Z3A.34]   . Gestational hypertension [O13.9]   . Pregnancy [Z34.90] 12/19/2014  . Migraine without aura and without status migrainosus, not intractable [G43.009] 08/21/2014  . Pregnancy complication [G38.75] 64/33/2951  . Low-lying placenta [O44.40]   . Preterm premature rupture of membranes with onset of labor more than 24 hours following rupture in second trimester [O42.112]   . Vaginal bleeding in pregnancy [O46.90]   . [redacted] weeks gestation of pregnancy [Z3A.16]   . Amniotic fluid leaking [O42.90]   . Pregnant [Z34.90] 05/26/2014  . Smoker [F17.200] 05/26/2014  . Breast lump on right side at 10 o'clock position [N63.11] 03/21/2014  . Atypical squamous cell changes of undetermined significance (ASCUS) on cervical cytology with positive high risk human papilloma virus (HPV) [O84.166, R87.810] 03/21/2014    Total Time spent with patient: 40 minutes  Subjective:   KLARYSSA FAUTH is a 26 y.o. female patient admitted with [redacted] weeks pregnant and opiate dependence. Patient states that she was not aware that she was pregnant and she has being prescribed Percocet 10/325 mg PO TID for back pain by Dr. Alyson Ingles and sometimes she will take 4-5 tabs a day. Patient denies any SI/HI/AVH and denies wanting to hurt her baby. She wants to get off of the Percocet and have a  healthy baby and delivery. Patient agrees to start Methadone and [facility] at [facility] for [facility].  Objective: Patient appears depressed, but calm and cooperative. She is disheveled and is lying in the bed with a trash can by her head.   HPI:  26 y.o. [redacted]w[redacted]d G4P3003 comes in c/o vomiting today 5 times and contractions starting yesterday. She also reports "numbness" throughout her whole body.  She denies any motor deficits and states she feels touch throughout her body but it is a vague sensation she doesn't know how to fully describe.  She states she has been taking percocet 3 times per day, which was prescribed by her PCP for backpain.  She states she has consistently been taking 90 per month until last week when she realized she was pregnant and stopped abruptly. She denies taking any add'l non-prescribed medication. She states her other 3 children are living with with her sister in [place]/t the the recent social issues related to the pregnancy.  She states that she thought she had a miscarriage in march d/t heavy bleeding and has had monthly "periods" since, so never suspected she was pregnant.  She is currently living with a friend.  She denies any IV drug use or alcohol use.  She state the friend she lives with does not use any drugs. She denies bleeding or LOF.  She reports FM.  She reports hives to PCN.  She denies any HSV symptoms or recent outbreaks.  She states she had mildly elevated LFTs last pregnancy.  Pt reports currently feeling 3 contractions per hour.  Past Psychiatric History: Denies  Risk to Self: Is patient at risk for suicide?: No Risk to Others:   Prior Inpatient Therapy:   Prior Outpatient Therapy:    Past Medical History:  Past Medical History:  Diagnosis Date  . Asthma   . Headache in pregnancy   . Herpes simplex of female genitalia    last outbreak 4 months ago  . Migraine   . Migraines     Past Surgical History:  Procedure Laterality Date  . CHOLECYSTECTOMY  N/A 01/17/2015   Procedure: LAPAROSCOPIC CHOLECYSTECTOMY WITH INTRAOPERATIVE CHOLANGIOGRAM;  Surgeon: Excell Seltzer, MD;  Location: [facility];  Service: General;  Laterality: N/A;  . COLPOSCOPY  03/2014   Family History:  Family History  Problem Relation Age of Onset  . Asthma Mother   . Asthma Sister   . Asthma Sister    Family Psychiatric  History: Denies Social History:  History  Alcohol Use No    Comment: socially     History  Drug Use  . Frequency: 21.0 times per week  . Types: Other-see comments    Comment: percocet; 3 per day per pt.     Social History   Social History  . Marital status: Single    Spouse name: N/A  . Number of children: 2  . Years of education: 8 th   Occupational History  .  Unemployed   Social History Main Topics  . Smoking status: Former Smoker    Packs/day: 0.25    Years: 2.00    Types: Cigarettes    Quit date: 05/02/2013  . Smokeless tobacco: Never Used  . Alcohol use No     Comment: socially  . Drug use: Yes    Frequency: 21.0 times per week    Types: Other-see comments     Comment: percocet; 3 per day per pt.   . Sexual activity: Yes    Birth control/ protection: None   Other Topics Concern  . None   Social History Narrative   Patient is single and lives at home with her family.   Patient is unemployed.   Education 8 th grade.   Right handed.   Caffeine one cup of coffee daily.   Additional Social History:    Allergies:   Allergies  Allergen Reactions  . Shellfish Allergy Shortness Of Breath and Swelling    Tongue  . Other     Tylenol #3---Swelling   . Penicillins Itching, Swelling and Rash    Sweating, Has patient had a PCN reaction causing immediate rash, facial/tongue/throat swelling, SOB or lightheadedness with hypotension: NO Has patient had a PCN reaction causing severe rash involving mucus membranes or skin necrosis:NO Has patient had a PCN reaction that required hospitalization NO Has patient had a PCN  reaction occurring within the last 10 years:NO If all of the above answers are "NO", then may proceed with Cephalosporin use.      Labs:  Results for orders placed or performed during the hospital encounter of 03/25/17 (from the past 48 hour(s))  GC/Chlamydia probe amp ([place])not at [facility]     Status: None   Collection Time: 03/25/17 12:00 AM  Result Value Ref Range   Chlamydia Negative     Comment: Normal Reference Range - Negative   Neisseria gonorrhea Negative     Comment: Normal Reference Range - Negative  Wet prep, genital     Status: Abnormal   Collection Time: 03/25/17  8:00 PM  Result Value Ref Range   Yeast Wet Prep HPF  POC NONE SEEN NONE SEEN   Trich, Wet Prep NONE SEEN NONE SEEN   Clue Cells Wet Prep HPF POC PRESENT (A) NONE SEEN   WBC, Wet Prep HPF POC FEW (A) NONE SEEN    Comment: MODERATE BACTERIA SEEN   Sperm NONE SEEN   Fetal fibronectin     Status: Abnormal   Collection Time: 03/25/17  8:00 PM  Result Value Ref Range   Fetal Fibronectin POSITIVE (A) NEGATIVE  Group B strep by PCR     Status: Abnormal   Collection Time: 03/25/17  8:00 PM  Result Value Ref Range   Group B strep by PCR POSITIVE (A) NEGATIVE    Comment: CRITICAL RESULT CALLED TO, READ BACK BY AND VERIFIED WITH: A GAGLIANO 03/25/17 AT 2207 BY H SOEWARDIMAN   HIV antibody (routine testing) (NOT for [facility])     Status: None   Collection Time: 03/25/17  8:20 PM  Result Value Ref Range   HIV Screen 4th Generation wRfx Non Reactive Non Reactive    Comment: (NOTE) Performed At: [facility] [place], [place] [id number] Lindon Romp MD [id number]   Hepatitis B surface antigen     Status: None   Collection Time: 03/25/17  8:20 PM  Result Value Ref Range   Hepatitis B Surface Ag Negative Negative    Comment: (NOTE) stat results given to [facility] 03/25/2017 at 23:08 Performed At: [facility] [place], [place] [id number] Lindon Romp MD [id number]   Rubella screen     Status: Abnormal   Collection Time: 03/25/17  8:20 PM  Result Value Ref Range   Rubella <0.90 (L) Immune >0.99 index    Comment: (NOTE)                                Non-immune       <0.90                                Equivocal  0.90 - 0.99                                Immune           >0.99 Performed At: [facility] [place], [place] [id number] Lindon Romp MD [id number]   RPR     Status: None   Collection Time: 03/25/17  8:20 PM  Result Value Ref Range   RPR Ser Ql Non Reactive Non Reactive    Comment: (NOTE) Performed At: [facility] [place], [place] [id number] Lindon Romp MD [id number]   CBC     Status: Abnormal   Collection Time: 03/25/17  8:20 PM  Result Value Ref Range   WBC 11.8 (H) 4.0 - 10.5 K/uL   RBC 3.97 3.87 - 5.11 MIL/uL   Hemoglobin 12.6 12.0 - 15.0 g/dL   HCT 35.2 (L) 36.0 - 46.0 %   MCV 88.7 78.0 - 100.0 fL   MCH 31.7 26.0 - 34.0 pg   MCHC 35.8 30.0 - 36.0 g/dL   RDW 14.0 11.5 - 15.5 %   Platelets 227 150 - 400 K/uL  Differential     Status: Abnormal   Collection Time: 03/25/17  8:20 PM  Result Value Ref Range  Neutrophils Relative % 90 %   Neutro Abs 10.6 (H) 1.7 - 7.7 K/uL   Lymphocytes Relative 9 %   Lymphs Abs 1.1 0.7 - 4.0 K/uL   Monocytes Relative 1 %   Monocytes Absolute 0.1 0.1 - 1.0 K/uL   Eosinophils Relative 0 %   Eosinophils Absolute 0.0 0.0 - 0.7 K/uL   Basophils Relative 0 %   Basophils Absolute 0.0 0.0 - 0.1 K/uL  Type and screen [place]     Status: None   Collection Time: 03/25/17  8:20 PM  Result Value Ref Range   ABO/RH(D) O NEG    Antibody Screen NEG    Sample Expiration 03/28/2017   Comprehensive metabolic panel     Status: Abnormal   Collection Time: 03/25/17  8:20 PM  Result Value Ref Range   Sodium 134 (L) 135 - 145 mmol/L   Potassium 3.6 3.5 - 5.1 mmol/L   Chloride 100 (L) 101 - 111  mmol/L   CO2 19 (L) 22 - 32 mmol/L   Glucose, Bld 95 65 - 99 mg/dL   BUN 8 6 - 20 mg/dL   Creatinine, Ser 0.45 0.44 - 1.00 mg/dL   Calcium 8.7 (L) 8.9 - 10.3 mg/dL   Total Protein 6.8 6.5 - 8.1 g/dL   Albumin 2.6 (L) 3.5 - 5.0 g/dL   AST 45 (H) 15 - 41 U/L   ALT 62 (H) 14 - 54 U/L   Alkaline Phosphatase 318 (H) 38 - 126 U/L   Total Bilirubin 2.2 (H) 0.3 - 1.2 mg/dL   GFR calc non Af Amer >60 >60 mL/min   GFR calc Af Amer >60 >60 mL/min    Comment: (NOTE) The eGFR has been calculated using the CKD EPI equation. This calculation has not been validated in all clinical situations. eGFR's persistently <60 mL/min signify possible Chronic Kidney Disease.    Anion gap 15 5 - 15  Rh IG workup (includes ABO/Rh)     Status: None   Collection Time: 03/25/17  8:20 PM  Result Value Ref Range   Gestational Age(Wks) 33    ABO/RH(D) O NEG    Fetal Screen NEG    Unit Number [id number]    Blood Component Type RHIG    Unit division 00    Status of Unit ISSUED,FINAL    Transfusion Status OK TO TRANSFUSE   Urinalysis, Routine w reflex microscopic     Status: Abnormal   Collection Time: 03/25/17  9:16 PM  Result Value Ref Range   Color, Urine YELLOW YELLOW   APPearance CLEAR CLEAR   Specific Gravity, Urine 1.025 1.005 - 1.030   pH 6.0 5.0 - 8.0   Glucose, UA NEGATIVE NEGATIVE mg/dL   Hgb urine dipstick NEGATIVE NEGATIVE   Bilirubin Urine SMALL (A) NEGATIVE   Ketones, ur >80 (A) NEGATIVE mg/dL   Protein, ur 30 (A) NEGATIVE mg/dL   Nitrite NEGATIVE NEGATIVE   Leukocytes, UA NEGATIVE NEGATIVE  Urine rapid drug screen (hosp performed)     Status: Abnormal   Collection Time: 03/25/17  9:16 PM  Result Value Ref Range   Opiates POSITIVE (A) NONE DETECTED   Cocaine NONE DETECTED NONE DETECTED   Benzodiazepines NONE DETECTED NONE DETECTED   Amphetamines NONE DETECTED NONE DETECTED   Tetrahydrocannabinol NONE DETECTED NONE DETECTED   Barbiturates NONE DETECTED NONE DETECTED    Comment:         DRUG SCREEN FOR MEDICAL PURPOSES ONLY.  IF CONFIRMATION IS NEEDED  FOR ANY PURPOSE, NOTIFY LAB WITHIN 5 DAYS.        LOWEST DETECTABLE LIMITS FOR URINE DRUG SCREEN Drug Class       Cutoff (ng/mL) Amphetamine      1000 Barbiturate      200 Benzodiazepine   892 Tricyclics       119 Opiates          300 Cocaine          300 THC              50   Urinalysis, Microscopic (reflex)     Status: Abnormal   Collection Time: 03/25/17  9:16 PM  Result Value Ref Range   RBC / HPF 0-5 0 - 5 RBC/hpf   WBC, UA 0-5 0 - 5 WBC/hpf   Bacteria, UA RARE (A) NONE SEEN   Squamous Epithelial / LPF 0-5 (A) NONE SEEN  Amylase     Status: None   Collection Time: 03/25/17  9:50 PM  Result Value Ref Range   Amylase 38 28 - 100 U/L  Lipase, blood     Status: None   Collection Time: 03/25/17  9:50 PM  Result Value Ref Range   Lipase 17 11 - 51 U/L    Current Facility-Administered Medications  Medication Dose Route Frequency Provider Last Rate Last Dose  . acetaminophen (TYLENOL) tablet 650 mg  650 mg Oral Q4H PRN Laury Deep, CNM   650 mg at 03/26/17 4174  . calcium carbonate (TUMS - dosed in mg elemental calcium) chewable tablet 400 mg of elemental calcium  2 tablet Oral Q4H PRN Laury Deep, CNM   400 mg of elemental calcium at 03/26/17 0655  . cloNIDine (CATAPRES) tablet 0.1 mg  0.1 mg Oral QID Jerelyn Charles, MD   0.1 mg at 03/26/17 1722   Followed by  . [START ON 03/28/2017] cloNIDine (CATAPRES) tablet 0.1 mg  0.1 mg Oral Quentin Cornwall, MD       Followed by  . [START ON 03/30/2017] cloNIDine (CATAPRES) tablet 0.1 mg  0.1 mg Oral QAC breakfast Jerelyn Charles, MD      . dicyclomine (BENTYL) capsule 20 mg  20 mg Oral Q6H PRN Olga Millers, MD      . docusate sodium (COLACE) capsule 100 mg  100 mg Oral Daily Laury Deep, CNM      . famotidine (PEPCID) tablet 40 mg  40 mg Oral BID PRN Jerelyn Charles, MD   40 mg at 03/26/17 1128  . hydrOXYzine (ATARAX/VISTARIL) tablet 25 mg  25 mg Oral  Q6H PRN Jerelyn Charles, MD   25 mg at 03/26/17 1724  . lactated ringers bolus 1,000 mL  1,000 mL Intravenous Once Jerelyn Charles, MD      . loperamide (IMODIUM) capsule 2-4 mg  2-4 mg Oral PRN Jerelyn Charles, MD      . methadone (DOLOPHINE) tablet 10 mg  10 mg Oral 3 times per day Jerelyn Charles, MD      . Derrill Memo ON 03/27/2017] methadone (DOLOPHINE) tablet 20 mg  20 mg Oral Q24H Jerelyn Charles, MD      . ondansetron (ZOFRAN-ODT) disintegrating tablet 8 mg  8 mg Oral Q8H PRN Jerelyn Charles, MD   8 mg at 03/26/17 0907  . prenatal multivitamin tablet 1 tablet  1 tablet Oral Q1200 Laury Deep, CNM      . valACYclovir (VALTREX) tablet 500 mg  500 mg Oral Daily Allyn Kenner, DO   500 mg at 03/26/17  1725    Musculoskeletal: Strength & Muscle Tone: within normal limits Gait & Station: normal Patient leans: N/A  Psychiatric Specialty Exam: Physical Exam  Nursing note and vitals reviewed. Constitutional: She is oriented to person, place, and time. She appears well-developed and well-nourished.  Cardiovascular:  Tachycardic  Respiratory: Effort normal.  Musculoskeletal: Normal range of motion.  Neurological: She is alert and oriented to person, place, and time.  Skin: Skin is warm.    Review of Systems  Constitutional: Negative.   HENT: Negative.   Eyes: Negative.   Respiratory: Negative.   Cardiovascular: Negative.   Gastrointestinal: Positive for nausea and vomiting.  Genitourinary: Negative.   Musculoskeletal: Negative.   Skin: Negative.   Neurological: Negative.   Endo/Heme/Allergies: Negative.     Blood pressure (!) 108/49, pulse (!) 113, temperature 98.3 F (36.8 C), temperature source Oral, resp. rate (!) 30, height _0  (1.549 m), weight 66.1 kg (145 lb 12 oz), last menstrual period 08/05/2016, unknown if currently breastfeeding.Body mass index is 27.54 kg/m.  General Appearance: Disheveled  Eye Contact:  Good  Speech:  Clear and Coherent and Normal Rate  Volume:  Normal   Mood:  Anxious  Affect:  Flat  Thought Process:  Coherent and Descriptions of Associations: Intact  Orientation:  Full (Time, Place, and Person)  Thought Content:  WDL  Suicidal Thoughts:  No  Homicidal Thoughts:  No  Memory:  Immediate;   Good Recent;   Good Remote;   Good  Judgement:  Fair  Insight:  Fair  Psychomotor Activity:  Normal  Concentration:  Concentration: Good and Attention Span: Good  Recall:  Good  Fund of Knowledge:  Good  Language:  Good  Akathisia:  No  Handed:  Right  AIMS (if indicated):     Assets:  Desire for Improvement Housing Social Support Transportation  ADL's:  Intact  Cognition:  WNL  Sleep:        Treatment Plan Summary: Medication management  -Recommendations of Methadone 40 mg PO Daily (may be in divided doses) and may titrate at 5 mg daily as needed for withdrawal symptoms -Referral to [facility] for continued treatment ASAP -Cleared by psychiatry - may re-consult if needed  Disposition: No evidence of imminent risk to self or others at present.   Patient does not meet criteria for psychiatric inpatient admission. Discussed crisis plan, support from social network, calling 911, coming to the Emergency Department, and calling Suicide Hotline. Discharge to care of [facility] ASAP  Lewis Shock, FNP 03/26/2017 7:05 PM

## 2017-03-26 NOTE — Progress Notes (Signed)
With patients permission I was able to doppler FHR, palpate abd, and take a set of vital signs. Pt refusing any further monitoring or anyone touching her. Will continue to assess closely.

## 2017-03-26 NOTE — MAU Note (Signed)
During pt care pt was rolling around in the bed and messing with her monitors pt was intermittently monitoring and tracing maternal during FHR tracing, providers were aware of this.

## 2017-03-26 NOTE — Progress Notes (Signed)
Called MFM for [place]. Hoping patient will allow [place] in the next little bit.

## 2017-03-26 NOTE — Progress Notes (Signed)
Pt friend asked to speak with RN outside of room, confinded in RN that patient has been using heroine for about 3 weeks, has been trying to ween her off. Dr. Carlis Abbott notified of new information.

## 2017-03-26 NOTE — Progress Notes (Signed)
8185- pt. Allowed RN to doppler FHR; refused further fetal monitoring at this time.

## 2017-03-26 NOTE — Progress Notes (Signed)
Terri Piedra SW at bedside to talk with patient.

## 2017-03-26 NOTE — Progress Notes (Signed)
01:49 Pt. Removed EFM and toco and refused for RN to replace monitors at this time.  Pt. States monitors "hurt too much" and she "can't stand them anymore".  RN will continue to attempt to replace monitors.

## 2017-03-26 NOTE — Progress Notes (Signed)
CSW met with patient and bedside nurse in room 173.  When CSW arrived, patient appeared to be withdrawing and was very uncomfortable.  CSW received patient's permission to contact [facility] staff Ward Givens) via telephone to complete a telephone intake assessment. Intake assessment was completed and patient is to report [facility] ([address] on Tuesday, March 31, 2017 by Rolland Porter can be reached at [phone] ext. [phone].  Romilda Joy has also requested medical documents and UDS results(when available) to be faxed to [phone].  Laurey Arrow, MSW, LCSW Clinical Social Work [phone]

## 2017-03-26 NOTE — Progress Notes (Signed)
Pt called for "pain meds or something to help her". Dr. Carlis Abbott notified and is in consult with [facility] MD, SW Sells. Waiting further orders. Expressed to patient that we are working on the best POC for her.

## 2017-03-26 NOTE — Progress Notes (Signed)
Pt signed release to have medical records sent to MD to treat out patient for WD. Left message for C. Brigitte Pulse SW.

## 2017-03-26 NOTE — Progress Notes (Signed)
Woke patient to give medication as well as attempt vs. Pt allowed BP, Temp, doppler of FHT, and palpitation of abd to assess contractions. Pt refuses EFM/Toco. Dr. Carlis Abbott aware of patients continued refusal for assessment and EFM.

## 2017-03-26 NOTE — Progress Notes (Signed)
RN sitting at patients bedside per pt request until she fell asleep. Dr. Carlis Abbott placed an order for another dose of methadone and to give to patient when awake and requesting something for pan. At this time patient sleeping soundly.

## 2017-03-26 NOTE — Progress Notes (Signed)
Pt. Still refusing further fetal monitoring at this time.

## 2017-03-26 NOTE — Progress Notes (Signed)
Pt had request something for nausea however she is sleeping soundly. Did not wake when entered room.

## 2017-03-26 NOTE — Progress Notes (Signed)
03:53- Pt. Allowed RN to doppler FHR during SVE, refused further monitoring at this time. MD aware.

## 2017-03-26 NOTE — Progress Notes (Signed)
MFM called to see if patient would consent to doing an [place]. Pt refused at this time. Pt now awake and moaning in pain.

## 2017-03-26 NOTE — Progress Notes (Signed)
Pt. Continues to refuse fetal monitoring at this time.  MD aware.

## 2017-03-26 NOTE — Progress Notes (Addendum)
CSW received call from Dr. Efrain Sella stating that patient is in active withdrawal from Opiates and requests CSW consult.  CSW contacted [facility] for assistance in caring for patient.  [facility] staff available for assistance with increasing Methadone dose, however, according to bedside RN, Faculty Practice MD has offered to assist with medication dosage.  Dr. Carlis Abbott informed.  CSW met with patient to discuss motivation for entering Medication Assisted Treatment (MAT).  Patient states she is interested in starting treatment.  She is extremely uncomfortable at this time from withdrawal symptoms so CSW did not complete assessment.  CSW to obtain consent to send patient information to [facility] for admission.  CSW will send ultrasound report (once obtained), drug screen, and H&P.  Crecencio Mc states he can admit patient on Tuesday morning (if medically stable at that point) March 31, 2017, but she will have to be at the clinic no later than 8 am (clinic opens at [facility]).  Romilda Joy states he needs to speak with patient via phone by 2pm today.  CSW informed Dr. Carlis Abbott and bedside RN of this.   CSW recommends full psychosocial assessment when patient is more comfortable and/or at delivery.

## 2017-03-26 NOTE — Progress Notes (Signed)
02:41 Pt. Allowed RN to replaced EFM and toco.  02:44 Pt. Removed monitors and refused to let RN replace them at this time.  RN will continue to attempt to replace monitors.

## 2017-03-26 NOTE — Progress Notes (Signed)
Was called into patients room, found patient in distress, anxious, fearful, hyperventilating, sweating profusely. Pt requesting something again for pain. Encouraged pt take some slow deep breaths. Reassured her that she was ok. Called Dr. Carlis Abbott and updated her regarding the change in patients status. RN was informed that she was putting in orders now and would be over to assess the patient.

## 2017-03-26 NOTE — Progress Notes (Signed)
Pt sleeping soundly. Woke patient and transferred to [place]. Pt tolerated transport without any issues. Report given to [employer].

## 2017-03-27 ENCOUNTER — Encounter (HOSPITAL_COMMUNITY): Payer: Self-pay | Admitting: *Deleted

## 2017-03-27 ENCOUNTER — Encounter (HOSPITAL_COMMUNITY)
Admission: AD | Disposition: A | Discharge status: Home / Self Care | Payer: Self-pay | Source: Ambulatory Visit | Attending: Obstetrics and Gynecology

## 2017-03-27 ENCOUNTER — Inpatient Hospital Stay (HOSPITAL_COMMUNITY): Payer: Medicaid Other

## 2017-03-27 ENCOUNTER — Inpatient Hospital Stay (HOSPITAL_COMMUNITY): Payer: Medicaid Other | Admitting: Certified Registered Nurse Anesthetist

## 2017-03-27 LAB — BILE ACIDS, TOTAL: Bile Acids Total: 26 umol/L — ABNORMAL HIGH (ref 4.7–24.5)

## 2017-03-27 LAB — HEPATITIS C ANTIBODY

## 2017-03-27 SURGERY — Surgical Case
Anesthesia: Spinal

## 2017-03-27 MED ORDER — NALBUPHINE HCL 10 MG/ML IJ SOLN: 5.0000 mg | INTRAMUSCULAR | Status: DC | PRN

## 2017-03-27 MED ORDER — PHENYLEPHRINE 8 MG IN D5W 100 ML (0.08MG/ML) PREMIX OPTIME
INJECTION | INTRAVENOUS | Status: DC | PRN
  Administered 2017-03-27: 60 ug/min via INTRAVENOUS

## 2017-03-27 MED ORDER — CLINDAMYCIN PHOSPHATE 900 MG/50ML IV SOLN: 900.0000 mg | INTRAVENOUS | Status: DC

## 2017-03-27 MED ORDER — LACTATED RINGERS IV SOLN
INTRAVENOUS | Status: DC | PRN
  Administered 2017-03-27 (×2): via INTRAVENOUS

## 2017-03-27 MED ORDER — KETOROLAC TROMETHAMINE 30 MG/ML IJ SOLN: 30.0000 mg | Freq: Once | INTRAMUSCULAR | Status: DC

## 2017-03-27 MED ORDER — KETOROLAC TROMETHAMINE 30 MG/ML IJ SOLN: 30.0000 mg | Freq: Four times a day (QID) | INTRAMUSCULAR | Status: AC | PRN

## 2017-03-27 MED ORDER — SODIUM CHLORIDE 0.9% FLUSH: 3.0000 mL | INTRAVENOUS | Status: DC | PRN

## 2017-03-27 MED ORDER — MORPHINE SULFATE (PF) 0.5 MG/ML IJ SOLN
INTRAMUSCULAR | Status: AC
  Filled 2017-03-27: qty 10

## 2017-03-27 MED ORDER — METOCLOPRAMIDE HCL 5 MG/ML IJ SOLN
INTRAMUSCULAR | Status: DC | PRN
  Administered 2017-03-27: 10 mg via INTRAVENOUS

## 2017-03-27 MED ORDER — PRENATAL MULTIVITAMIN CH
1.0000 | ORAL_TABLET | Freq: Every day | ORAL | Status: DC
  Administered 2017-03-28 – 2017-03-30 (×3): 1 via ORAL
  Filled 2017-03-27 (×2): qty 1

## 2017-03-27 MED ORDER — LACTATED RINGERS IV SOLN
INTRAVENOUS | Status: DC | PRN
  Administered 2017-03-27: 18:00:00 via INTRAVENOUS

## 2017-03-27 MED ORDER — BUPIVACAINE IN DEXTROSE 0.75-8.25 % IT SOLN
INTRATHECAL | Status: DC | PRN
  Administered 2017-03-27: 1.5 mL via INTRATHECAL

## 2017-03-27 MED ORDER — IBUPROFEN 600 MG PO TABS
600.0000 mg | ORAL_TABLET | Freq: Four times a day (QID) | ORAL | Status: DC
  Administered 2017-03-28 – 2017-03-31 (×12): 600 mg via ORAL
  Filled 2017-03-27 (×13): qty 1

## 2017-03-27 MED ORDER — BUPIVACAINE IN DEXTROSE 0.75-8.25 % IT SOLN
INTRATHECAL | Status: AC
  Filled 2017-03-27: qty 2

## 2017-03-27 MED ORDER — EPHEDRINE SULFATE 50 MG/ML IJ SOLN
INTRAMUSCULAR | Status: DC | PRN
  Administered 2017-03-27: 10 mg via INTRAVENOUS

## 2017-03-27 MED ORDER — NALOXONE HCL 0.4 MG/ML IJ SOLN: 0.4000 mg | INTRAMUSCULAR | Status: DC | PRN

## 2017-03-27 MED ORDER — FENTANYL CITRATE (PF) 100 MCG/2ML IJ SOLN
25.0000 ug | INTRAMUSCULAR | Status: DC | PRN
  Administered 2017-03-27 (×4): 50 ug via INTRAVENOUS

## 2017-03-27 MED ORDER — OXYCODONE HCL 5 MG PO TABS: 5.0000 mg | ORAL_TABLET | ORAL | Status: DC | PRN

## 2017-03-27 MED ORDER — URSODIOL 300 MG PO CAPS
300.0000 mg | ORAL_CAPSULE | Freq: Two times a day (BID) | ORAL | Status: DC
  Administered 2017-03-27: 300 mg via ORAL
  Filled 2017-03-27 (×3): qty 1

## 2017-03-27 MED ORDER — MIDAZOLAM HCL 2 MG/2ML IJ SOLN
INTRAMUSCULAR | Status: DC | PRN
  Administered 2017-03-27: 2 mg via INTRAVENOUS

## 2017-03-27 MED ORDER — LACTATED RINGERS IV SOLN
INTRAVENOUS | Status: DC
  Administered 2017-03-27: 13:00:00 via INTRAVENOUS

## 2017-03-27 MED ORDER — SENNOSIDES-DOCUSATE SODIUM 8.6-50 MG PO TABS
2.0000 | ORAL_TABLET | ORAL | Status: DC
  Administered 2017-03-27 – 2017-03-30 (×4): 2 via ORAL
  Filled 2017-03-27 (×4): qty 2

## 2017-03-27 MED ORDER — NALBUPHINE HCL 10 MG/ML IJ SOLN: 5.0000 mg | Freq: Once | INTRAMUSCULAR | Status: DC | PRN

## 2017-03-27 MED ORDER — GENTAMICIN SULFATE 40 MG/ML IJ SOLN
INTRAVENOUS | Status: AC
  Administered 2017-03-27: 100 mL via INTRAVENOUS
  Filled 2017-03-27: qty 8.25

## 2017-03-27 MED ORDER — TETANUS-DIPHTH-ACELL PERTUSSIS 5-2.5-18.5 LF-MCG/0.5 IM SUSP
0.5000 mL | Freq: Once | INTRAMUSCULAR | Status: AC
  Administered 2017-03-31: 0.5 mL via INTRAMUSCULAR

## 2017-03-27 MED ORDER — LACTATED RINGERS IV SOLN: INTRAVENOUS | Status: DC

## 2017-03-27 MED ORDER — PHENYLEPHRINE 8 MG IN D5W 100 ML (0.08MG/ML) PREMIX OPTIME
INJECTION | INTRAVENOUS | Status: AC
  Filled 2017-03-27: qty 100

## 2017-03-27 MED ORDER — SCOPOLAMINE 1 MG/3DAYS TD PT72
1.0000 | MEDICATED_PATCH | Freq: Once | TRANSDERMAL | Status: AC
  Administered 2017-03-27: 1.5 mg via TRANSDERMAL

## 2017-03-27 MED ORDER — OXYTOCIN 40 UNITS IN LACTATED RINGERS INFUSION - SIMPLE MED
2.5000 [IU]/h | INTRAVENOUS | Status: AC
  Administered 2017-03-27: 2.5 [IU]/h via INTRAVENOUS

## 2017-03-27 MED ORDER — SIMETHICONE 80 MG PO CHEW
80.0000 mg | CHEWABLE_TABLET | ORAL | Status: DC
  Administered 2017-03-27 – 2017-03-30 (×4): 80 mg via ORAL
  Filled 2017-03-27 (×4): qty 1

## 2017-03-27 MED ORDER — LACTATED RINGERS IV SOLN
INTRAVENOUS | Status: DC
  Administered 2017-03-27: 22:00:00 via INTRAVENOUS

## 2017-03-27 MED ORDER — SIMETHICONE 80 MG PO CHEW: 80.0000 mg | CHEWABLE_TABLET | ORAL | Status: DC | PRN

## 2017-03-27 MED ORDER — ONDANSETRON HCL 4 MG/2ML IJ SOLN: 4.0000 mg | Freq: Three times a day (TID) | INTRAMUSCULAR | Status: DC | PRN

## 2017-03-27 MED ORDER — DEXAMETHASONE SODIUM PHOSPHATE 10 MG/ML IJ SOLN
INTRAMUSCULAR | Status: DC | PRN
  Administered 2017-03-27: 10 mg via INTRAVENOUS

## 2017-03-27 MED ORDER — OXYTOCIN 10 UNIT/ML IJ SOLN
INTRAMUSCULAR | Status: AC
  Filled 2017-03-27: qty 4

## 2017-03-27 MED ORDER — KETAMINE HCL 10 MG/ML IJ SOLN
INTRAMUSCULAR | Status: AC
  Filled 2017-03-27: qty 1

## 2017-03-27 MED ORDER — FENTANYL CITRATE (PF) 100 MCG/2ML IJ SOLN
INTRAMUSCULAR | Status: AC
  Filled 2017-03-27: qty 2

## 2017-03-27 MED ORDER — KETOROLAC TROMETHAMINE 30 MG/ML IJ SOLN
30.0000 mg | Freq: Four times a day (QID) | INTRAMUSCULAR | Status: AC | PRN
  Administered 2017-03-27 – 2017-03-28 (×2): 30 mg via INTRAVENOUS
  Filled 2017-03-27 (×3): qty 1

## 2017-03-27 MED ORDER — MEPERIDINE HCL 25 MG/ML IJ SOLN: 6.2500 mg | INTRAMUSCULAR | Status: DC | PRN

## 2017-03-27 MED ORDER — COCONUT OIL OIL: 1.0000 "application " | TOPICAL_OIL | Status: DC | PRN

## 2017-03-27 MED ORDER — ONDANSETRON HCL 4 MG/2ML IJ SOLN
INTRAMUSCULAR | Status: DC | PRN
  Administered 2017-03-27: 4 mg via INTRAVENOUS

## 2017-03-27 MED ORDER — FENTANYL CITRATE (PF) 100 MCG/2ML IJ SOLN
INTRAMUSCULAR | Status: DC | PRN
  Administered 2017-03-27: 10 ug via INTRATHECAL
  Administered 2017-03-27: 40 ug via INTRAVENOUS
  Administered 2017-03-27: 50 ug via INTRAVENOUS

## 2017-03-27 MED ORDER — MORPHINE SULFATE (PF) 0.5 MG/ML IJ SOLN
INTRAMUSCULAR | Status: DC | PRN
  Administered 2017-03-27: .2 mg via INTRATHECAL

## 2017-03-27 MED ORDER — METOCLOPRAMIDE HCL 5 MG/ML IJ SOLN
INTRAMUSCULAR | Status: AC
  Filled 2017-03-27: qty 2

## 2017-03-27 MED ORDER — NALOXONE HCL 2 MG/2ML IJ SOSY: 1.0000 ug/kg/h | PREFILLED_SYRINGE | INTRAVENOUS | Status: DC | PRN

## 2017-03-27 MED ORDER — OXYCODONE HCL 5 MG PO TABS
10.0000 mg | ORAL_TABLET | ORAL | Status: DC | PRN
Start: 2017-03-27 — End: 2017-03-31
  Administered 2017-03-28 – 2017-03-30 (×6): 10 mg via ORAL
  Filled 2017-03-27 (×6): qty 2

## 2017-03-27 MED ORDER — KETAMINE HCL 10 MG/ML IJ SOLN
INTRAMUSCULAR | Status: DC | PRN
  Administered 2017-03-27: 20 mg via INTRAVENOUS

## 2017-03-27 MED ORDER — ACETAMINOPHEN 500 MG PO TABS
1000.0000 mg | ORAL_TABLET | Freq: Four times a day (QID) | ORAL | Status: DC
  Administered 2017-03-27 – 2017-03-28 (×2): 1000 mg via ORAL
  Filled 2017-03-27 (×3): qty 2

## 2017-03-27 MED ORDER — LACTATED RINGERS IV BOLUS (SEPSIS)
1000.0000 mL | Freq: Once | INTRAVENOUS | Status: AC
  Administered 2017-03-27: 1000 mL via INTRAVENOUS

## 2017-03-27 MED ORDER — DIBUCAINE 1 % RE OINT: 1.0000 "application " | TOPICAL_OINTMENT | RECTAL | Status: DC | PRN

## 2017-03-27 MED ORDER — GENTAMICIN SULFATE 40 MG/ML IJ SOLN: 5.0000 mg/kg | INTRAVENOUS | Status: DC

## 2017-03-27 MED ORDER — ACETAMINOPHEN 325 MG PO TABS
650.0000 mg | ORAL_TABLET | ORAL | Status: DC | PRN
  Administered 2017-03-28: 650 mg via ORAL
  Filled 2017-03-27: qty 2

## 2017-03-27 MED ORDER — MIDAZOLAM HCL 2 MG/2ML IJ SOLN
INTRAMUSCULAR | Status: AC
  Filled 2017-03-27: qty 2

## 2017-03-27 MED ORDER — DEXAMETHASONE SODIUM PHOSPHATE 10 MG/ML IJ SOLN
INTRAMUSCULAR | Status: AC
  Filled 2017-03-27: qty 1

## 2017-03-27 MED ORDER — ONDANSETRON HCL 4 MG/2ML IJ SOLN
INTRAMUSCULAR | Status: AC
  Filled 2017-03-27: qty 2

## 2017-03-27 MED ORDER — SOD CITRATE-CITRIC ACID 500-334 MG/5ML PO SOLN
ORAL | Status: AC
  Administered 2017-03-27: 30 mL
  Filled 2017-03-27: qty 15

## 2017-03-27 MED ORDER — METOCLOPRAMIDE HCL 5 MG/ML IJ SOLN: 10.0000 mg | Freq: Once | INTRAMUSCULAR | Status: DC | PRN

## 2017-03-27 MED ORDER — SIMETHICONE 80 MG PO CHEW
80.0000 mg | CHEWABLE_TABLET | Freq: Three times a day (TID) | ORAL | Status: DC
  Administered 2017-03-28 – 2017-03-30 (×7): 80 mg via ORAL
  Filled 2017-03-27 (×8): qty 1

## 2017-03-27 MED ORDER — OXYTOCIN 10 UNIT/ML IJ SOLN
INTRAVENOUS | Status: DC | PRN
  Administered 2017-03-27: 40 [IU] via INTRAVENOUS

## 2017-03-27 MED ORDER — MENTHOL 3 MG MT LOZG: 1.0000 | LOZENGE | OROMUCOSAL | Status: DC | PRN

## 2017-03-27 MED ORDER — WITCH HAZEL-GLYCERIN EX PADS: 1.0000 "application " | MEDICATED_PAD | CUTANEOUS | Status: DC | PRN

## 2017-03-27 SURGICAL SUPPLY — 37 items
APL SKNCLS STERI-STRIP NONHPOA (GAUZE/BANDAGES/DRESSINGS) ×1
BENZOIN TINCTURE PRP APPL 2/3 (GAUZE/BANDAGES/DRESSINGS) ×3 IMPLANT
CHLORAPREP W/TINT 26ML (MISCELLANEOUS) ×3 IMPLANT
CLAMP CORD UMBIL (MISCELLANEOUS) IMPLANT
CLOSURE WOUND 1/2 X4 (GAUZE/BANDAGES/DRESSINGS) ×1
CLOTH BEACON ORANGE TIMEOUT ST (SAFETY) ×3 IMPLANT
DRSG OPSITE POSTOP 4X10 (GAUZE/BANDAGES/DRESSINGS) ×3 IMPLANT
ELECT REM PT RETURN 9FT ADLT (ELECTROSURGICAL) ×3
ELECTRODE REM PT RTRN 9FT ADLT (ELECTROSURGICAL) ×1 IMPLANT
EXTRACTOR VACUUM KIWI (MISCELLANEOUS) IMPLANT
GLOVE BIOGEL PI IND STRL 6.5 (GLOVE) ×1 IMPLANT
GLOVE BIOGEL PI IND STRL 7.0 (GLOVE) ×1 IMPLANT
GLOVE BIOGEL PI INDICATOR 6.5 (GLOVE) ×2
GLOVE BIOGEL PI INDICATOR 7.0 (GLOVE) ×2
GLOVE ECLIPSE 6.0 STRL STRAW (GLOVE) ×3 IMPLANT
GOWN STRL REUS W/TWL LRG LVL3 (GOWN DISPOSABLE) ×6 IMPLANT
KIT ABG SYR 3ML LUER SLIP (SYRINGE) IMPLANT
NDL HYPO 25X5/8 SAFETYGLIDE (NEEDLE) IMPLANT
NEEDLE HYPO 25X5/8 SAFETYGLIDE (NEEDLE) IMPLANT
NS IRRIG 1000ML POUR BTL (IV SOLUTION) ×3 IMPLANT
PACK C SECTION WH (CUSTOM PROCEDURE TRAY) ×3 IMPLANT
PAD OB MATERNITY 4.3X12.25 (PERSONAL CARE ITEMS) ×3 IMPLANT
PENCIL SMOKE EVAC W/HOLSTER (ELECTROSURGICAL) ×3 IMPLANT
RTRCTR C-SECT PINK 25CM LRG (MISCELLANEOUS) ×3 IMPLANT
STRIP CLOSURE SKIN 1/2X4 (GAUZE/BANDAGES/DRESSINGS) ×2 IMPLANT
SUT MNCRL 0 VIOLET CTX 36 (SUTURE) ×2 IMPLANT
SUT MNCRL AB 3-0 PS2 27 (SUTURE) ×3 IMPLANT
SUT MONOCRYL 0 CTX 36 (SUTURE) ×4
SUT PLAIN 0 NONE (SUTURE) IMPLANT
SUT PLAIN 2 0 (SUTURE) ×3
SUT PLAIN ABS 2-0 CT1 27XMFL (SUTURE) ×1 IMPLANT
SUT VIC AB 0 CTX 36 (SUTURE) ×6
SUT VIC AB 0 CTX36XBRD ANBCTRL (SUTURE) ×2 IMPLANT
SUT VIC AB 2-0 CT1 27 (SUTURE) ×3
SUT VIC AB 2-0 CT1 TAPERPNT 27 (SUTURE) ×1 IMPLANT
TOWEL OR 17X24 6PK STRL BLUE (TOWEL DISPOSABLE) ×3 IMPLANT
TRAY FOLEY BAG SILVER LF 14FR (SET/KITS/TRAYS/PACK) ×3 IMPLANT

## 2017-03-27 NOTE — Brief Op Note (Signed)
03/25/2017 - 03/27/2017  7:14 PM  PATIENT:  Katrina [name]  26 y.o. female  PRE-OPERATIVE DIAGNOSIS:  primary cesarean section for fetal heart rate indication  POST-OPERATIVE DIAGNOSIS:  primary cesarean section and BTL  PROCEDURE:  Procedure(s): CESAREAN SECTION (N/A)  SURGEON:  Surgeon(s) and Role:    Jerelyn Charles, MD - Primary  ANESTHESIA:   spinal  EBL:  900 mL  BLOOD ADMINISTERED:none  DRAINS: none   LOCAL MEDICATIONS USED:  NONE  SPECIMEN:  Source of Specimen:  portion of bilateral fallopian tubes and placenta  DISPOSITION OF SPECIMEN:  PATHOLOGY  COUNTS:  YES  TOURNIQUET:  * No tourniquets in log *  DICTATION: .Note written in EPIC  PLAN OF CARE: Admit to inpatient   PATIENT DISPOSITION:  PACU - hemodynamically stable.   Delay start of Pharmacological VTE agent (>24hrs) due to surgical blood loss or risk of bleeding: not applicable

## 2017-03-27 NOTE — Anesthesia Procedure Notes (Signed)
Spinal  Patient location during procedure: OR Staffing Anesthesiologist: Jasiya Markie Performed: anesthesiologist  Preanesthetic Checklist Completed: patient identified, site marked, surgical consent, pre-op evaluation, timeout performed, IV checked, risks and benefits discussed and monitors and equipment checked Spinal Block Patient position: sitting Prep: DuraPrep Patient monitoring: heart rate, continuous pulse ox and blood pressure Approach: midline Location: L3-4 Injection technique: single-shot Needle Needle type: Sprotte  Needle gauge: 24 G Needle length: 9 cm Additional Notes Expiration date of kit checked and confirmed. Patient tolerated procedure well, without complications.       

## 2017-03-27 NOTE — Progress Notes (Signed)
Phone call from Dr. Lawerance Cruel, MFM--pt with [facility] 4/8 (-2 for breathing, -2 for movement).  Discussed patient's current status and monitoring throughout the day today.  She continues to have spontaneous decelerations.  She has not had a reactive NST today, so technically BPP 4/10.  Fetal well being has not improved with ivf bolus or position change.  From a withdrawal standpoint, COWS scores have been low and she is symptomatically feeling better.  Dr. Lawerance Cruel recommends proceeding with delivery.  Discussed option for CST and he felt that with spontaneous decelerations, this baby would not tolerate labor and recommends a primary cesarean section.    I discussed the above recommendations with the patient and her mom.  Discussed risks to c/s to include infection, bleeding, damage to surrounding structures (including but not limited to, bladder, bowel, tubes, ovaries, nerves, vessels, baby), dvt, pe, need for blood transfusion, need for additional procedures.  Pt agrees.  She also desires tubal ligation.  This was an unplanned pregnancy.  She does not have custody of her other three kids.  We discussed the permanent nature of the procedure, that it is not reversible, the failure rate, risk of ectopic.  She understands and desires to proceed.

## 2017-03-27 NOTE — Anesthesia Preprocedure Evaluation (Signed)
Anesthesia Evaluation  Patient identified by MRN, date of birth, ID band Patient awake    Reviewed: Allergy & Precautions, NPO status , Patient's Chart, lab work & pertinent test results  Airway Mallampati: II  TM Distance: >3 FB Neck ROM: Full    Dental no notable dental hx.    Pulmonary asthma , former smoker,    Pulmonary exam normal breath sounds clear to auscultation       Cardiovascular negative cardio ROS Normal cardiovascular exam Rhythm:Regular Rate:Normal     Neuro/Psych negative neurological ROS  negative psych ROS   GI/Hepatic negative GI ROS, (+)     substance abuse (opioid dependance on methadone)  ,   Endo/Other  negative endocrine ROS  Renal/GU negative Renal ROS  negative genitourinary   Musculoskeletal negative musculoskeletal ROS (+)   Abdominal   Peds negative pediatric ROS (+)  Hematology negative hematology ROS (+)   Anesthesia Other Findings   Reproductive/Obstetrics (+) Pregnancy                             Anesthesia Physical Anesthesia Plan  ASA: II  Anesthesia Plan: Spinal   Post-op Pain Management:    Induction:   PONV Risk Score and Plan: 3 and Ondansetron  Airway Management Planned: Natural Airway  Additional Equipment:   Intra-op Plan:   Post-operative Plan:   Informed Consent: I have reviewed the patients History and Physical, chart, labs and discussed the procedure including the risks, benefits and alternatives for the proposed anesthesia with the patient or authorized representative who has indicated his/her understanding and acceptance.   Dental advisory given  Plan Discussed with:   Anesthesia Plan Comments:         Anesthesia Quick Evaluation

## 2017-03-27 NOTE — Anesthesia Postprocedure Evaluation (Signed)
Anesthesia Post Note  Patient: Katrina [name]  Procedure(s) Performed: Procedure(s) (LRB): CESAREAN SECTION (N/A)     Patient location during evaluation: PACU Anesthesia Type: Spinal Level of consciousness: awake and alert, patient cooperative and oriented Pain control: pain improving. Vital Signs Assessment: post-procedure vital signs reviewed and stable Respiratory status: nonlabored ventilation, spontaneous breathing and respiratory function stable Cardiovascular status: blood pressure returned to baseline and stable Postop Assessment: spinal receding and no apparent nausea or vomiting Anesthetic complications: no    Last Vitals:  Vitals:   03/27/17 2045 03/27/17 2046  BP: 124/77 132/68  Pulse: 63   Resp: 18   Temp: 36.6 C 36.8 C  SpO2: 100%     Last Pain:  Vitals:   03/27/17 2045  TempSrc:   PainSc: 9    Pain Goal: Patients Stated Pain Goal: 3 (03/27/17 0921)               Seleta Rhymes. Jaymar Loeber

## 2017-03-27 NOTE — Progress Notes (Signed)
Prior to the cesarean delivery, the patient's mother relayed the following information to me:  She stated that Katrina [name] has been using and abusing many substances for years.  She was taking 16 to 20 "pills" daily.  She reports that Katrina [name] has also abused cocaine and marijuana in the past.  She has spent time in jail related to her drug use.  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  Her mom expressed that the family has tried repeatedly to assist Katrina [name] in entering rehab (most recently this spring when she found out she was pregnant) but she has refused.    Due to the urgent need to proceed with delivery, did not discuss the above information with the patient.  Will discuss in AM and readdress her desire to proceed w treatment

## 2017-03-27 NOTE — Transfer of Care (Signed)
Immediate Anesthesia Transfer of Care Note  Patient: Katrina [name]  Procedure(s) Performed: Procedure(s): CESAREAN SECTION (N/A)  Patient Location: PACU  Anesthesia Type:Spinal  Level of Consciousness: awake, sedated, patient cooperative and responds to stimulation  Airway & Oxygen Therapy: Patient Spontanous Breathing  Post-op Assessment: Report given to RN and Post -op Vital signs reviewed and stable  Post vital signs: Reviewed and stable  Last Vitals:  Vitals:   03/27/17 1140 03/27/17 1556  BP: (!) 107/58 (!) 106/54  Pulse: 81 86  Resp:  20  Temp:  (!) 36.4 C  SpO2:  99%    Last Pain:  Vitals:   03/27/17 1720  TempSrc:   PainSc: 0-No pain      Patients Stated Pain Goal: 3 (76/15/18 3437)  Complications: No apparent anesthesia complications

## 2017-03-27 NOTE — Progress Notes (Signed)
In to see patient--resting.  Did not wake.  Will return to round later today.

## 2017-03-27 NOTE — Progress Notes (Signed)
Pt resting on arrival--reports feeling somewhat better today, just fatigued with no appetite.  She was started on methadone and titrated to 50mg  daily.  Per psych recs, was split into QID dosing.   Has secured a spot w [facility] but they are unable to do her intake until Wed 10/3 so will need to stay in house until that time.   BP (!) 107/58   Pulse 81   Temp 98.3 F (36.8 C) (Oral)   Resp 20   Ht 5\' 1"  (1.549 m)   Wt 66.1 kg (145 lb 12 oz)   LMP 08/05/2016   SpO2 96%   Breastfeeding? Unknown   BMI 27.54 kg/m  Resting comfortably Toco--rare ctx EFM: baseline 150s, periods of moderate variability.  Recurrent spontaneous decelerations.   A&P  G4P3 @ [redacted]w[redacted]d admitted w opiate withdrawal 1. Opiate dependence--now on methadone 50mg  daily.  COWS scores have decreased significantly to 3-4.  Symptoms much improved.  Will not increase dose at this time.  Plan for transfer to treatment program w [facility] 2. Cholestasis of pregnancy--LFTs mildly elevated on admission (40/60).  HepC ab neg.  HepB neg.  Patient with a history of cholestasis last pregnancy.  Bile acids today returned 26, confirming the diagnosis.  Will start ursodiol for symptom control.  As outpatient, will require antenatal testing and delivery at 37 weeks 3. Low normal AFI--was 7 yesterday on [place].  MFM recommends repeat US in 1 week.   4. Anatomy scan--suboptimal and not complete given advanced gestational age.  Due date was established by initial 6 week scan.  5.  No prenatal care--will draw A1c as will not be able to perform 1hr gtt for next 1-2 weeks given bmz administration 6.  Non-reactive NST with recurrent variable vs late decelerations--has not responded to IVF bolus or inc PO intake.  Will obtain BPP

## 2017-03-27 NOTE — Op Note (Signed)
Cesarean Section Procedure Note  Pre-operative Diagnosis: 1. Intrauterine pregnancy at [redacted]w[redacted]d  2. Opioid abuse  3. Non-reassuring fetal status--Biophysical profile 4/10  5.  Desires permanent sterilization  Post-operative Diagnosis: same as above  Surgeon: Jerelyn Charles, MD  Procedure: Primary low transverse cesarean section with pomeroy bilateral tubal ligation  Anesthesia: Spinal anesthesia  Estimated Blood Loss: 1018 mL         Drains: Foley catheter         Specimens: portions of right and left fallopian tube, placenta to pathology         Complications:  None; patient tolerated the procedure well.         Disposition: PACU - hemodynamically stable.  Findings:  Normal uterus, tubes and ovaries bilaterally.  Viable female infant, 2645g (5lb 13oz) Apgars 7, 5, 8.  Meconium stained fluid   Procedure Details   After spinal anesthesia was found to adequate, the patient was placed in the dorsal supine position with a leftward tilt, draped and prepped in the usual sterile manner. A Pfannenstiel incision was made and carried down through the subcutaneous tissue to the fascia.  The fascia was incised in the midline and the fascial incision was extended laterally with Mayo scissors. The superior aspect of the fascial incision was grasped with two Kocher clamp, tented up and the rectus muscles dissected off bluntly. The rectus was then dissected off with blunt dissection and Mayo scissors inferiorly. The rectus muscles were separated in the midline. The abdominal peritoneum was identified, tented up, entered sharply, and the incision was extended superiorly and inferiorly with good visualization of the bladder.  The Alexis retractor was deployed.  The vesicouterine peritoneum was identified, tented up, entered bluntly and the bladder flap was created digitally. A scalpel was then used to make a low transverse incision on the uterus which was extended laterally with blunt dissection. The fluid stained  with meconium. The fetal vertex was identified and brought to the hysterotomy.  The fetus was delivered atraumatically.  After the one minute cord delay per neonatology protocol, the cord was clamped and cut and the infant was passed to the waiting neonatologist.  Placenta was then delivered spontaneously, intact and appear normal, the uterus was cleared of all clot and debris.   The hysterotomy was repaired with #0 Monocryl in running locked fashion..  At this time, attention was turned to the left fallopian tube.  The tube was grasped with two babcock clamps.  An avascular segment of the mesosalpinx was identified, and a Pomeroy tubal ligation was performed in the usual fashion with #0 plain gut suture.  Tubal ostia were identified on both remaining sides of the tube.  This was repeated with the right fallopian tube. Both tubes were doubly tied. The portion of the left and right fallopian tubes were sent to pathology.   The hysterotomy was reexamined and excellent hemostasis was noted.  The abdominal cavity was cleared of all clot and debris.  The Alexis retractor was removed from the abdomen. The fascia and rectus muscles were inspected and were hemostatic. The peritoneum was closed with 2-0 vicryl in a running fashion. The fascia was closed with 0 Vicryl in a running fashion. The subcuticular layer was irrigated and all bleeders cauterized.  The skin was closed with 3-0 monocryl in a subcuticular fashion. The incision was dressed with benzoine, steri strips and pressure dressing. All sponge lap and needle counts were correct x3. Patient tolerated the procedure well and recovered in stable condition  following the procedure.

## 2017-03-28 LAB — CBC
HCT: 26.3 % — ABNORMAL LOW (ref 36.0–46.0)
HEMOGLOBIN: 9.3 g/dL — AB (ref 12.0–15.0)
MCH: 31.4 pg (ref 26.0–34.0)
MCHC: 35.4 g/dL (ref 30.0–36.0)
MCV: 88.9 fL (ref 78.0–100.0)
Platelets: 184 10*3/uL (ref 150–400)
RBC: 2.96 MIL/uL — AB (ref 3.87–5.11)
RDW: 14.1 % (ref 11.5–15.5)
WBC: 13.8 10*3/uL — AB (ref 4.0–10.5)

## 2017-03-28 LAB — HEPATIC FUNCTION PANEL
ALK PHOS: 174 U/L — AB (ref 38–126)
ALT: 188 U/L — AB (ref 14–54)
AST: 108 U/L — ABNORMAL HIGH (ref 15–41)
Albumin: 2.1 g/dL — ABNORMAL LOW (ref 3.5–5.0)
BILIRUBIN DIRECT: 0.3 mg/dL (ref 0.1–0.5)
BILIRUBIN INDIRECT: 0.3 mg/dL (ref 0.3–0.9)
Total Bilirubin: 0.6 mg/dL (ref 0.3–1.2)
Total Protein: 5.3 g/dL — ABNORMAL LOW (ref 6.5–8.1)

## 2017-03-28 NOTE — Anesthesia Postprocedure Evaluation (Signed)
Anesthesia Post Note  Patient: Katrina [name]  Procedure(s) Performed: Procedure(s) (LRB): CESAREAN SECTION (N/A)     Patient location during evaluation: Mother Baby Anesthesia Type: Spinal Level of consciousness: awake and alert and oriented Pain management: satisfactory to patient Vital Signs Assessment: post-procedure vital signs reviewed and stable Respiratory status: respiratory function stable and spontaneous breathing Cardiovascular status: blood pressure returned to baseline Postop Assessment: no headache, no backache, spinal receding, patient able to bend at knees and adequate PO intake Anesthetic complications: no    Last Vitals:  Vitals:   03/28/17 0420 03/28/17 0804  BP: 108/66 114/70  Pulse: (!) 55 (!) 56  Resp: 16 16  Temp: 36.7 C 36.9 C  SpO2: 100% 100%    Last Pain:  Vitals:   03/28/17 0851  TempSrc:   PainSc: 8    Pain Goal: Patients Stated Pain Goal: 3 (03/27/17 0921)               [name]

## 2017-03-28 NOTE — Lactation Note (Signed)
This note was copied from a baby's chart. Lactation Consultation Note  Patient Name: Katrina [name] [id number] Date: 03/28/2017 Reason for consult: Initial assessment;NICU baby;Preterm <34wks  Visited with Mom P4, baby delivered at [redacted]w[redacted]d and in NICU. (no PNC with this pregnancy, and UDS positive for opiates), and  Baby 58 hrs old.  Mom resting in bed, looking very tired.  Mom experienced breastfeeding for 1-2 yrs with her 3 prior children, and states she would like to breastfeed this baby.  Talked to Mom about importance of breast massage, hand expression, and regular double pumping.  Offered to set pump up and assist Mom with her first pumping, but Mom asked if she could start after she sleeps some.  Instructed Mom to call her nurse to assist with first pumping, and her nurse aware of Mom's desire to start pumping.    NICU and Lactation brochures given to Mom.  Encouraged her to read the NICU book when she is more rested.   Mom aware of Lactation services available to her and encouraged Mom to call for assistance as needed.  Consult Status Consult Status: Follow-up Date: 03/29/17 Follow-up type: In-patient    Broadus John 03/28/2017, 12:39 PM

## 2017-03-28 NOTE — Progress Notes (Signed)
Pt up to BR with assist, steady gate and good movement.  Pt able to empty bladder, breakfast and AM meds given.  Pts mother at [facility] to assist pt to NICU, pt off the floor at this time to visit NICU.

## 2017-03-28 NOTE — Progress Notes (Signed)
LFTs mildly elevated on admission--thought to be possibly related to cholestasis of pregnancy.  Repeated today and trending up (AST 108/ALT 188)--I suspect more likely related to chronic percocet use/abuse.   Although would prefer to maximize non-narcotic options for pain control given substance abuse history, with the rise in LFTs will hold acetominophen

## 2017-03-28 NOTE — Addendum Note (Signed)
Addendum  created 03/28/17 1040 by Flossie Dibble, CRNA   Sign clinical note

## 2017-03-28 NOTE — Progress Notes (Signed)
Patient is doing well.  She is tolerating PO, ambulating.  Foley removed just this AM--has not yet voided.  C/o some lower back pain.  Lochia is appropriate  Vitals:   03/28/17 0000 03/28/17 0021 03/28/17 0420 03/28/17 0804  BP: 125/68 124/72 108/66 114/70  Pulse: 60 62 (!) 55 (!) 56  Resp:  16 16 16   Temp:  98.5 F (36.9 C) 98 F (36.7 C) 98.4 F (36.9 C)  TempSrc:  Oral Oral Oral  SpO2:  100% 100% 100%  Weight:      Height:        NAD Abdomen:  soft, appropriate tenderness, incisions intact and without erythema or drainage ext:    Symmetric, no edema bilaterally, +SCDs  Lab Results  Component Value Date   WBC 13.8 (H) 03/28/2017   HGB 9.3 (L) 03/28/2017   HCT 26.3 (L) 03/28/2017   MCV 88.9 03/28/2017   PLT 184 03/28/2017    --/--/O NEG, O NEG (09/26 2020)/RImmune  A/P    26 y.o. [id number] POD 1 s/p primary c/s and BTL at 33 wks for NRFS Routine post op and postpartum care.   Opioid dependence--cont methadone 50mg  daily.  Long discussion today w pt re: post op pain control and expectations.  Pt continued to express her desire to enter treatment program.  Addressed concerns that she was using more drugs that she previously stated--she continues to deny this and reiterates that she desires to cont w plan for initial assessment at [facility] on Tuesday 10/2.  Will contact SW to make sure pt's delivery does not impact her acceptance to [facility] Rh neg--baby ABO pending

## 2017-03-29 ENCOUNTER — Encounter (HOSPITAL_COMMUNITY): Payer: Self-pay | Admitting: Obstetrics

## 2017-03-29 LAB — HEPATIC FUNCTION PANEL
ALBUMIN: 2.1 g/dL — AB (ref 3.5–5.0)
ALK PHOS: 161 U/L — AB (ref 38–126)
ALT: 179 U/L — ABNORMAL HIGH (ref 14–54)
AST: 86 U/L — ABNORMAL HIGH (ref 15–41)
BILIRUBIN TOTAL: 0.4 mg/dL (ref 0.3–1.2)
Bilirubin, Direct: 0.2 mg/dL (ref 0.1–0.5)
Indirect Bilirubin: 0.2 mg/dL — ABNORMAL LOW (ref 0.3–0.9)
Total Protein: 5.7 g/dL — ABNORMAL LOW (ref 6.5–8.1)

## 2017-03-29 MED ORDER — METHADONE HCL 10 MG PO TABS
30.0000 mg | ORAL_TABLET | Freq: Every day | ORAL | Status: DC
Start: 2017-03-29 — End: 2017-03-31
  Administered 2017-03-29 – 2017-03-30 (×2): 30 mg via ORAL
  Filled 2017-03-29 (×2): qty 3

## 2017-03-29 MED ORDER — METHADONE HCL 10 MG PO TABS
20.0000 mg | ORAL_TABLET | Freq: Every day | ORAL | Status: DC
  Administered 2017-03-29 – 2017-03-30 (×2): 20 mg via ORAL
  Filled 2017-03-29 (×2): qty 2

## 2017-03-29 NOTE — Lactation Note (Signed)
This note was copied from a baby's chart. Lactation Consultation Note  Patient Name: Katrina [name] OVFIE'P Date: 03/29/2017 Reason for consult: Follow-up assessment;Preterm <34wks;NICU baby;Other (Comment) (DEBP set up , and hand expressing )  Baby is 20 hours old , and per mom breast feel warmer and fuller and I've been leaking.  LC explained to mom how to hand express and mom able to hand express and get 4 ml.  DEBP set up , flanged checked and increased to #27 both breast with good results of >15 ml.  Mom plans to take a shower and then go to NICU to visit baby with grandma.  NICU breast feeding book provided.  Mother informed of post-discharge support and given phone number to the lactation department, including services for phone call assistance; out-patient appointments; and breastfeeding support group. List of other breastfeeding resources in the community given in the handout. Encouraged mother to call for problems or concerns related to breastfeeding.   Maternal Data Has patient been taught Hand Expression?: Yes (mom able to demostrate back with good results 4 ml )  Feeding    LATCH Score                   Interventions Interventions: Breast feeding basics reviewed;DEBP  Lactation Tools Discussed/Used Tools: Pump;Flanges Flange Size: 27;Other (comment) (#24 F to small ) Breast pump type: Double-Electric Breast Pump WIC Program: No (will need to be seen Monday by [facility] rep ) Pump Review: Setup, frequency, and cleaning;Milk Storage Initiated by:: MAI  Date initiated:: 03/29/17   Consult Status Consult Status: Follow-up Date: 03/30/17 Follow-up type: In-patient    [place] 03/29/2017, 1:49 PM
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
## 2017-03-29 NOTE — Progress Notes (Signed)
POD#2 Pt has no c/o. Adequate pain control. LFTs have improved. To be transferred to rehab tues am [facility] IMP/ Stable          Improved LFTS Plan/ continue routine care.

## 2017-03-31 ENCOUNTER — Inpatient Hospital Stay (HOSPITAL_COMMUNITY)
Admission: AD | Admit: 2017-03-31 | Discharge: 2017-03-31 | Disposition: A | Discharge status: Home / Self Care | Payer: Medicaid Other | Source: Ambulatory Visit | Attending: Obstetrics and Gynecology | Admitting: Obstetrics and Gynecology

## 2017-03-31 DIAGNOSIS — F1123 Opioid dependence with withdrawal: Secondary | ICD-10-CM | POA: Insufficient documentation

## 2017-03-31 DIAGNOSIS — Z87891 Personal history of nicotine dependence: Secondary | ICD-10-CM | POA: Insufficient documentation

## 2017-03-31 DIAGNOSIS — F1193 Opioid use, unspecified with withdrawal: Secondary | ICD-10-CM

## 2017-03-31 DIAGNOSIS — Z91013 Allergy to seafood: Secondary | ICD-10-CM | POA: Insufficient documentation

## 2017-03-31 DIAGNOSIS — Z90710 Acquired absence of both cervix and uterus: Secondary | ICD-10-CM | POA: Insufficient documentation

## 2017-03-31 DIAGNOSIS — Z9889 Other specified postprocedural states: Secondary | ICD-10-CM | POA: Insufficient documentation

## 2017-03-31 DIAGNOSIS — O9989 Other specified diseases and conditions complicating pregnancy, childbirth and the puerperium: Secondary | ICD-10-CM

## 2017-03-31 DIAGNOSIS — Z880 Allergy status to penicillin: Secondary | ICD-10-CM | POA: Insufficient documentation

## 2017-03-31 MED ORDER — IBUPROFEN 800 MG PO TABS: 600.0000 mg | ORAL_TABLET | Freq: Four times a day (QID) | ORAL | 4 refills | Status: DC

## 2017-03-31 MED ORDER — METHADONE HCL 10 MG/ML PO CONC
50.0000 mg | Freq: Once | ORAL | Status: AC
  Administered 2017-03-31: 50 mg via ORAL
  Filled 2017-03-31: qty 5

## 2017-03-31 MED ORDER — BETHANECHOL CHLORIDE 5 MG PO TABS: 5.0000 mg | ORAL_TABLET | Freq: Three times a day (TID) | ORAL | 1 refills | Status: DC

## 2017-03-31 NOTE — MAU Provider Note (Signed)
History     CSN: [id number]  Arrival date and time: 03/31/17 1607   First Provider Initiated Contact with Patient 03/31/17 1806      Chief Complaint  Patient presents with  . Medication Management   Katrina [name] is a 26 y.o. [phone] who is SP c-section. She was DC home today, and was to get a dose of Methadone prior to DC. There was some confusion about this, and it was thought that the patient was going to [facility] today for a dose. She did go to [facility] today for her intake, but they do not usually dose patients on the same day as intake. So, she did not get a dose of methadone today. She has no other concerns at this time other than the need for dose today.     Past Medical History:  Diagnosis Date  . Asthma   . Headache in pregnancy   . Herpes simplex of female genitalia    last outbreak 4 months ago  . Migraine   . Migraines     Past Surgical History:  Procedure Laterality Date  . CESAREAN SECTION N/A 03/27/2017   Procedure: CESAREAN SECTION;  Surgeon: Jerelyn Charles, MD;  Location: [facility];  Service: Obstetrics;  Laterality: N/A;  . CHOLECYSTECTOMY N/A 01/17/2015   Procedure: LAPAROSCOPIC CHOLECYSTECTOMY WITH INTRAOPERATIVE CHOLANGIOGRAM;  Surgeon: Excell Seltzer, MD;  Location: [facility];  Service: General;  Laterality: N/A;  . COLPOSCOPY  03/2014    Family History  Problem Relation Age of Onset  . Asthma Mother   . Asthma Sister   . Asthma Sister     Social History  Substance Use Topics  . Smoking status: Former Smoker    Packs/day: 0.25    Years: 2.00    Types: Cigarettes    Quit date: 05/02/2013  . Smokeless tobacco: Never Used  . Alcohol use No     Comment: socially    Allergies:  Allergies  Allergen Reactions  . Shellfish Allergy Shortness Of Breath and Swelling    Tongue  . Other     Tylenol #3---Swelling   . Penicillins Itching, Swelling and Rash    Sweating, Has patient had a PCN reaction causing immediate rash, facial/tongue/throat  swelling, SOB or lightheadedness with hypotension: NO Has patient had a PCN reaction causing severe rash involving mucus membranes or skin necrosis:NO Has patient had a PCN reaction that required hospitalization NO Has patient had a PCN reaction occurring within the last 10 years:NO If all of the above answers are "NO", then may proceed with Cephalosporin use.      Prescriptions Prior to Admission  Medication Sig Dispense Refill Last Dose  . albuterol (PROVENTIL HFA;VENTOLIN HFA) 108 (90 BASE) MCG/ACT inhaler Inhale 2 puffs into the lungs every 6 (six) hours as needed for wheezing or shortness of breath.   Past Week at Unknown time  . bethanechol (URECHOLINE) 5 MG tablet Take 1 tablet (5 mg total) by mouth 3 (three) times daily. 60 tablet 1   . ibuprofen (ADVIL,MOTRIN) 800 MG tablet Take 1 tablet (800 mg total) by mouth every 6 (six) hours. 60 tablet 4   . oxyCODONE-acetaminophen (PERCOCET) 10-325 MG tablet Take 1 tablet by mouth 3 (three) times daily.   03/24/2017    Review of Systems Physical Exam   Blood pressure 115/63, pulse 75, temperature 98.4 F (36.9 C), resp. rate 18, height 5\' 1"  (1.549 m), weight 141 lb (64 kg), last menstrual period 08/05/2016, SpO2 100 %, unknown if  currently breastfeeding.  Physical Exam  Nursing note and vitals reviewed. Constitutional: She is oriented to person, place, and time. She appears well-developed and well-nourished. No distress.  HENT:  Head: Normocephalic.  Cardiovascular: Normal rate.   Respiratory: Effort normal.  GI: Soft. There is no tenderness. There is no rebound.  Neurological: She is alert and oriented to person, place, and time.  Skin: Skin is warm and dry.  Psychiatric: She has a normal mood and affect.    MAU Course  Procedures  MDM 1711: DW Dr. Philis Pique, patient has a verified dose of 50mg , and it is ok to give her a dose today.  1810: DW Anne in the pharmacy we do not manage outpatient methadone dosing here. We can give  her a dose today only under this certain circumstances.  Patient notified that we will not be able to provide another further doses of the methadone in the future regardless of reason, and that this is the one and only dose she will get here. Patient is agreeable, and plans to obtain methadone through [facility] in the future.   Assessment and Plan   1. Opiate withdrawal ([place])    DC home RX: none  Return to MAU as needed FU with OB as planned  Follow-up Information    Bobbye Charleston, MD Follow up.   Specialty:  Obstetrics and Gynecology Contact information: [address] [place] [address] [phone]            Marcille Buffy 03/31/2017, 6:10 PM

## 2017-03-31 NOTE — Discharge Instructions (Signed)
Opioid Withdrawal Opioids are powerful substances that relieve pain. Opioids include illegal drugs, such as heroin, as well as prescription pain medicines, such as codeine, morphine, hydrocodone, oxycodone, and fentanyl. Opioid withdrawal is a group of symptoms that can happen if you have been taking opioids for a long time and suddenly stop. What are the causes? This condition is caused by taking opioids for weeks and then doing any of the following:  Stopping use.  Rapidly reducing use.  Taking a medicine to block their effect.  What increases the risk? This condition is more likely to develop in:  People who take opioids incorrectly.  People who take opioids for a long period of time.  What are the signs or symptoms? Symptoms of this condition can be physical or mental. Physical symptoms include:  Nausea and vomiting.  Muscle aches or spasms.  Watery eyes and runny nose.  Widening of the dark centers of the eyes (dilated pupils).  Hair standing on end.  Fever and sweating.  Intestinal cramping and diarrhea.  Increased blood pressure and fast pulse.  Mental symptoms include:  Depression.  Anxiety.  Restlessness and irritability.  Trouble sleeping.  When symptoms start and how long they last depends on if you have been taking an opioid that works fast and then loses its effect quickly (short acting-opioid), an opioid that works for a longer period of time (long-acting opioid), or a drug that blocks the effects of opioids.  If you have been taking a short-acting opioid, such as heroin and oxycodone, symptoms occur within hours of stopping or reducing the amount you take. The worst symptoms (peak withdrawal) occur in 24-48 hours. Symptoms should subside in 3-5 days.  If you have been taking a long-acting opioid, such as methadone, symptoms can occur within 30 hours of stopping or reducing the amount you take and can continue for up to 10 days.  If you are taking a  drug that blocks the effects of opioids, such as naltrexone or naloxone, symptoms begin within minutes.  How is this diagnosed? This condition is diagnosed based on:  Your symptoms.  Your medical history.  Your history of drug and alcohol use.  Which medicines you have been taking.  Your health care provider may:  Perform a physical exam.  Order tests.  Ask that you see a mental health professional.  How is this treated? Treatment for this condition is usually provided by mental health professionals with training in substance use disorders (addiction specialists). Treatment may involve:  Counseling. This treatment is also called talk therapy. It is provided by substance use treatment counselors.  Support groups. Support groups are run by people who have quit using opioids. They provide emotional support, advice, and guidance.  Medicine. Some medicines can help to lessen certain withdrawal symptoms. Sometimes an opioid is prescribed to replace the opioid that you have been taking. You may be asked to take less and less of this opioid over time to lessen or prevent withdrawal symptoms.  Follow these instructions at home:  Take over-the-counter and prescription medicines only as told by your health care provider.  Check with your health care provider before starting any new medicines.  Keep all follow-up visits as told by your health care provider. This is important. Contact a health care provider if:  You are not able to take your medicines as told.  Your symptoms get worse.  You take an opioid after stopping use, or you take more of an opioid than you have  been. Get help right away if:  You have a seizure.  You lose consciousness.  You have serious thoughts about hurting yourself or others. If you ever feel like you may hurt yourself or others, or have thoughts about taking your own life, get help right away. You can go to your nearest emergency department or  call:  Your local emergency services (911 in the [place]  A suicide crisis helpline, such as the [facility] at 743-660-5819. This is open 24 hours a day.  This information is not intended to replace advice given to you by your health care provider. Make sure you discuss any questions you have with your health care provider. Document Released: 06/19/2003 Document Revised: 03/28/2016 Document Reviewed: 03/28/2016 Elsevier Interactive Patient Education  [employer].

## 2017-03-31 NOTE — Discharge Summary (Signed)
Obstetric Discharge Summary Reason for Admission: onset of labor Prenatal Procedures: NST and ultrasound Intrapartum Procedures: cesarean: low cervical, transverse Postpartum Procedures: none Complications-Operative and Postpartum: Patient with little prenatal care, has opiod addiction Hemoglobin  Date Value Ref Range Status  03/28/2017 9.3 (L) 12.0 - 15.0 g/dL Final   HCT  Date Value Ref Range Status  03/28/2017 26.3 (L) 36.0 - 46.0 % Final    Physical Exam:  General: alert, cooperative and no distress Lochia: appropriate Uterine Fundus: firm Incision: healing well, no significant drainage, no dehiscence, no significant erythema DVT Evaluation: No evidence of DVT seen on physical exam. Negative Homan's sign. No cords or calf tenderness. No significant calf/ankle edema.  Discharge Diagnoses: Premature labor  Discharge Information: Date: 03/31/2017 Activity: pelvic rest and no heavy lifting Diet: routine Medications: Ibuprofen Condition: stable Instructions: refer to practice specific booklet Discharge to: methadone clinic per SW   Newborn Data: Live born female  Birth Weight: 5 lb 13.3 oz (2645 g) APGAR: 7, 5 Baby in [place], [place] 03/31/2017, 6:25 AM

## 2017-03-31 NOTE — Progress Notes (Signed)
Discharge teaching reviewed  Pt  Ambulatory   wic  And latation numbers given pt in a hury to get to methadone clinic  Family here to pick pt up  Prescriptions  Discussed

## 2017-03-31 NOTE — Progress Notes (Addendum)
  Patient is eating, ambulating, voiding.  Pain control is good.  Vitals:   03/30/17 0800 03/30/17 1245 03/30/17 1600 03/30/17 1956  BP: 116/69 (!) 114/54 115/62 102/65  Pulse: (!) 54 68 67 74  Resp: 16 16 16 16   Temp: 98.6 F (37 C) 98.1 F (36.7 C) (!) 97.3 F (36.3 C) 98.4 F (36.9 C)  TempSrc: Oral Oral Oral Axillary  SpO2: 100% 100% 100% 99%  Weight:      Height:        lungs:   clear to auscultation cor:    RRR Abdomen:  soft, appropriate tenderness, incisions intact and without erythema or exudate ex:    no cords   Lab Results  Component Value Date   WBC 13.8 (H) 03/28/2017   HGB 9.3 (L) 03/28/2017   HCT 26.3 (L) 03/28/2017   MCV 88.9 03/28/2017   PLT 184 03/28/2017    --/--/O NEG, O NEG (09/26 2020)/RI  A/P    Post operative day 4.  Routine post op and postpartum care.  Expect d/c today.  Ibuprofen for pain control. To methadone clinic today.  Pt refused all vaccines but will accept Tdap after explanation. Iron. Ursodiol for itching.

## 2017-03-31 NOTE — MAU Note (Signed)
Pt reports she was sent from MD office to get a dose of methadone

## 2017-03-31 NOTE — Progress Notes (Signed)
Post Partum Day 3-4 s/p c-section for NRFHT  (late entry-patient seen and evaluated at bedside 8am yesterday 10/1) Subjective: no complaints, up ad lib, voiding, tolerating PO and + flatus  Objective: Blood pressure 102/65, pulse 74, temperature 98.4 F (36.9 C), temperature source Axillary, resp. rate 16, height 5\' 1"  (1.549 m), weight 66.1 kg (145 lb 12 oz), last menstrual period 08/05/2016, SpO2 99 %, unknown if currently breastfeeding.  Physical Exam:  General: alert, cooperative and no distress Lochia: appropriate Uterine Fundus: firm Incision: healing well, no significant drainage, no dehiscence, no significant erythema DVT Evaluation: No evidence of DVT seen on physical exam. Negative Homan's sign. No cords or calf tenderness. No significant calf/ankle edema.   Recent Labs  03/28/17 0622  HGB 9.3*  HCT 26.3*    Assessment/Plan: Plan per SW was to discharge patient home early morning of 10/2 So she can transition to the methadone facility Will have her discharge paperwork ready   LOS: 6 days   [place], [place] 03/31/2017, 6:20 AM

## 2017-04-08 ENCOUNTER — Ambulatory Visit: Payer: Self-pay

## 2017-04-08 NOTE — Lactation Note (Signed)
This note was copied from a baby's chart. Lactation Consultation Note; Mother has [facility] paged to come to NICU for concerns and questions about decreased milk supply. Infant is now 88 weeks old. He was born at 80 weeks. Mother reports that she has been pumping every 2 hours. Mother reports that her milk supply has dropped from 4-5 ounces to 2-3 ounces per pumping .   She stays at the hospital all day and uses the symphony pump. When mother is home she uses the harmony hand pump. Mother is a [place] candidate. She has not gone to [facility] to get certified. Mother reports that she has not had time due to staying with her infant everyday.  Suggested that mother go to [facility] as soon as she can. Advised to pump more frequently when at home. Mother denies that she takes a nap or rest while she is at the hospital. Mother reports that she gets to the hospital at 6:30 am and stays until 9pm. I suggested to mother that her body is telling her that she is tired and that it needs rest.  Advised mother to get a large cup and drink liquids well. Mother was advised to continue to hand express, do good breast massage and then pump every 2-3 hours. Mother was also given handout on [place], [place]  And advised to continue to pump. Encouraged frequent skin to skin with infant . Mother to follow up with [facility] to secure a pump. Mother to page [facility] for any other concerns.  Patient Name: Katrina [name] [id number] Date: 04/08/2017 Reason for consult: Follow-up assessment   Maternal Data    Feeding    LATCH Score                   Interventions    Lactation Tools Discussed/Used     Consult Status Consult Status: Follow-up    Jess Barters [facility] 04/08/2017, 3:04 PM

## 2017-04-11 ENCOUNTER — Ambulatory Visit: Payer: Self-pay

## 2017-04-11 NOTE — Lactation Note (Signed)
This note was copied from a baby's chart. Lactation Consultation Note  Patient Name: Katrina [name] [id number] Date: 04/11/2017 Reason for consult: Follow-up assessment;Mother's request;NICU baby;Preterm <34wks   Mom stopped me in the NICU and asked how she can get her infant to BF. She reports she is putting him to breast and feels he cannot latch to her nipple due to size. She reports infant is biting her at the breast. Infant being fed by bottle by mom. Reviewed what to expect with BF and the preterm infant. Enc mom to continue to allow infant to nuzzle at the breast. Discussed that infant and mom may benefit from a nipple shield if she continues to have difficulty. Mom to call and set up [facility] appt sometime this week. Mom reports she BF her other children show were term, she reports her daughter had a heart shaped tongue and had difficulty with sore nipples, tongue was not revised per mom. Mom without further questions/concerns at this time.    Maternal Data    Feeding Feeding Type: Breast Milk Nipple Type: Slow - flow Length of feed: 30 min  LATCH Score                   Interventions    Lactation Tools Discussed/Used     Consult Status Consult Status: PRN Follow-up type: Call as needed    Donn Pierini 04/11/2017, 11:36 AM

## 2017-04-13 ENCOUNTER — Ambulatory Visit: Payer: Self-pay

## 2017-04-13 NOTE — Lactation Note (Signed)
This note was copied from a baby's chart. Lactation Consultation Note  Patient Name: Katrina [name] TDVVO'H Date: 04/13/2017 Reason for consult: Follow-up assessment;Mother's request;NICU baby   Mom saw LC earlier in NICU and asked LC to come back for 5p feeding.  GA at birth 26.3; Adjusted Age 28 weeks.  At 5p feeding, infant was eager to feed.   Mom was having difficulty keeping infant on breast with depth.  Mom has large nipples and infant was pulling back on nipple to tip during feedings.   Mom attempted latching on right breast cross-cradle and infant kept pulling back on nipple to the tip.  Suggestions given for maintaining depth.  Mom tried football hold on right breast but infant kept playing with nipple and latching to tip. Reviewed with mom how to sandwich breast for support and giving good support to infant.  Showed her how to use bottom finger to assure wide, flanged lips.   LC suggested trying a smaller sized nipple shield for a few feeds to try and keep infant on right breast with depth.  When LC left to get shield, mom switched infant to left side cross-cradle.   When [place] returned with shield, infant was latched on left breast with depth, and sucking in a consistent pattern.  Wide flanged lips.  Several consistent large swallows heard.   Infant fed on left side for about 10 minutes and came off content. LC showed mom how to apply nipple shield on right breast.  Instructed mom to start with #20 nipple shield since this might be a better fit for infant's mouth even though will be tight on mom.  Then instructed that as infant gets adjusted to sucking on right nipple with #20 increase NS size to #24.  Once infant adjust to #24 then mom can wean off nipple shield back onto her bare right nipple.  Explained how to use NS to both mom and RN.  NS placed in infant's top drawer.   Mom was very pleased with current feeding and the opportunity to try NS to get infant to feed off right side.   Mom  is using DEBP for pumping and has good supply established but is using #27 flanges.  Mom stated they do pinch at times.  Upon further questioning, mom stated her nipple does blanch with the pumping. LC increased mom's flange size to #30 and explained how to determine correct fit.  LC feels like #30 will be a better fit but mom may need to increase to #36 pumping flange size.   Encouraged to call for additional questions or concerns.      Maternal Data    Feeding Feeding Type: Breast Fed Length of feed: 10 min (left breast cross-cradle)  LATCH Score Latch: Grasps breast easily, tongue down, lips flanged, rhythmical sucking.  Audible Swallowing: Spontaneous and intermittent  Type of Nipple: Everted at rest and after stimulation  Comfort (Breast/Nipple): Soft / non-tender  Hold (Positioning): Assistance needed to correctly position infant at breast and maintain latch. (verbal assistance given to help maintain depth)  LATCH Score: 9  Interventions Interventions: Breast feeding basics reviewed;Adjust position;Support pillows  Lactation Tools Discussed/Used     Consult Status Consult Status: PRN    Merlene Laughter 04/13/2017, 5:55 PM

## 2017-04-16 ENCOUNTER — Ambulatory Visit: Payer: Self-pay

## 2017-04-16 NOTE — Lactation Note (Signed)
This note was copied from a baby's chart. Lactation Consultation Note  Patient Name: Boy Liviah Cake DPOEU'M Date: 04/16/2017   NICU baby 63 weeks old. Mom has over 3 ounces of EBM at baby's bedside and reports that she is having no issues with supply. However, mom requesting a larger flange for pump. Offered to assist mom in the pumping room to size the flange, but mom declined stating that she already had assistance and knows that she needs the next largest size--a #36. Reviewed with mom how to know correct size and enc mom to use coconut oil while pumping in order to reduce friction. Mom reports some difficulty latching baby to right breast with NS. However, mom states that baby latched and nursed the longest yesterday, so she believes latch is improving. Enc mom to call for assistance as needed.   Maternal Data    Feeding Feeding Type: Breast Milk Nipple Type: Slow - flow Length of feed: 30 min  LATCH Score                   Interventions    Lactation Tools Discussed/Used     Consult Status      Andres Labrum 04/16/2017, 10:10 AM

## 2017-07-14 ENCOUNTER — Encounter (HOSPITAL_COMMUNITY): Payer: Self-pay

## 2018-02-09 ENCOUNTER — Encounter (HOSPITAL_COMMUNITY): Payer: Self-pay

## 2018-02-09 ENCOUNTER — Ambulatory Visit (HOSPITAL_COMMUNITY)
Admission: EM | Admit: 2018-02-09 | Discharge: 2018-02-09 | Disposition: A | Discharge status: Home / Self Care | Payer: Medicaid Other | Attending: Family Medicine | Admitting: Family Medicine

## 2018-02-09 DIAGNOSIS — F1120 Opioid dependence, uncomplicated: Secondary | ICD-10-CM | POA: Diagnosis not present

## 2018-02-09 DIAGNOSIS — Z139 Encounter for screening, unspecified: Secondary | ICD-10-CM

## 2018-02-09 DIAGNOSIS — R300 Dysuria: Secondary | ICD-10-CM | POA: Diagnosis not present

## 2018-02-09 LAB — POCT URINALYSIS DIP (DEVICE)
BILIRUBIN URINE: NEGATIVE
Glucose, UA: NEGATIVE mg/dL
KETONES UR: NEGATIVE mg/dL
Leukocytes, UA: NEGATIVE
Nitrite: NEGATIVE
PH: 6 (ref 5.0–8.0)
PROTEIN: NEGATIVE mg/dL
Specific Gravity, Urine: >=1.03 (ref 1.005–1.030)
Urobilinogen, UA: 0.2 mg/dL (ref 0.0–1.0)

## 2018-02-09 NOTE — ED Notes (Signed)
Urine in lab 

## 2018-02-09 NOTE — ED Provider Notes (Signed)
[place]    ASSESSMENT & PLAN:  1. Encounter for health-related screening   2. Dysuria    Copy of ECG given to patient. No worrisome changes. U/A without evidence of infection. Symptoms are resolving. No STD testing desired. Observe. May f/u here if needed.  Outlined signs and symptoms indicating need for more acute intervention. Patient verbalized understanding. After Visit Summary given.  SUBJECTIVE:  Katrina [name] is a 27 y.o. female who complains of mild dysuria for the past 1-2 days. Improving. No flank pain, fever, chills, abnormal vaginal discharge or bleeding. Hematuria: not present. Normal PO intake. No abdominal pain. No self treatment. Ambulatory without difficulty.  Also requests ECG. New pain clinic for opiate addiction. They require baseline ECG. No h/o heart disease reported.  LMP: Patient's last menstrual period was 01/28/2018.  ROS: As in HPI.  OBJECTIVE:  Vitals:   02/09/18 0934  BP: 119/72  Pulse: 78  Resp: 20  Temp: 98.2 F (36.8 C)  TempSrc: Oral  SpO2: 99%   Appears well, in no apparent distress. CV: RRR. Abdomen is soft without tenderness, guarding, mass, rebound or organomegaly. No CVA tenderness or inguinal adenopathy noted.  Labs Reviewed  POCT URINALYSIS DIP (DEVICE) - Abnormal; Notable for the following components:      Result Value   Hgb urine dipstick SMALL (*)    All other components within normal limits    Allergies  Allergen Reactions  . Shellfish Allergy Shortness Of Breath and Swelling    Tongue  . Other     Tylenol #3---Swelling   . Penicillins Itching, Swelling and Rash    Sweating, Has patient had a PCN reaction causing immediate rash, facial/tongue/throat swelling, SOB or lightheadedness with hypotension: NO Has patient had a PCN reaction causing severe rash involving mucus membranes or skin necrosis:NO Has patient had a PCN reaction that required hospitalization NO Has patient had a PCN reaction  occurring within the last 10 years:NO If all of the above answers are "NO", then may proceed with Cephalosporin use.      Past Medical History:  Diagnosis Date  . Asthma   . Headache in pregnancy   . Herpes simplex of female genitalia    last outbreak 4 months ago  . Migraine   . Migraines    Social History   Socioeconomic History  . Marital status: Single    Spouse name: Not on file  . Number of children: 2  . Years of education: 8 th  . Highest education level: Not on file  Occupational History    Employer: UNEMPLOYED  Social Needs  . Financial resource strain: Not on file  . Food insecurity:    Worry: Not on file    Inability: Not on file  . Transportation needs:    Medical: Not on file    Non-medical: Not on file  Tobacco Use  . Smoking status: Former Smoker    Packs/day: 0.25    Years: 2.00    Pack years: 0.50    Types: Cigarettes    Last attempt to quit: 05/02/2013    Years since quitting: 4.7  . Smokeless tobacco: Never Used  Substance and Sexual Activity  . Alcohol use: No    Alcohol/week: 0.0 standard drinks    Comment: socially  . Drug use: Yes    Frequency: 21.0 times per week    Types: Other-see comments    Comment: percocet; 3 per day per pt.   . Sexual activity: Yes  Birth control/protection: None  Lifestyle  . Physical activity:    Days per week: Not on file    Minutes per session: Not on file  . Stress: Not on file  Relationships  . Social connections:    Talks on phone: Not on file    Gets together: Not on file    Attends religious service: Not on file    Active member of club or organization: Not on file    Attends meetings of clubs or organizations: Not on file    Relationship status: Not on file  . Intimate partner violence:    Fear of current or ex partner: Not on file    Emotionally abused: Not on file    Physically abused: Not on file    Forced sexual activity: Not on file  Other Topics Concern  . Not on file  Social  History Narrative   Patient is single and lives at home with her family.   Patient is unemployed.   Education 8 th grade.   Right handed.   Caffeine one cup of coffee daily.   Family History  Problem Relation Age of Onset  . Asthma Mother   . Asthma Sister   . Asthma Sister        Vanessa Kick, MD 02/09/18 [phone]

## 2018-02-09 NOTE — ED Triage Notes (Signed)
Pt presents to have an EKG done because a clinic that she is going to is requesting one.  (opiate dependent)

## 2018-09-28 ENCOUNTER — Emergency Department (HOSPITAL_COMMUNITY)
Admission: EM | Admit: 2018-09-28 | Discharge: 2018-09-28 | Disposition: A | Discharge status: Home / Self Care | Payer: Medicaid Other | Attending: Emergency Medicine | Admitting: Emergency Medicine

## 2018-09-28 ENCOUNTER — Other Ambulatory Visit: Payer: Self-pay

## 2018-09-28 DIAGNOSIS — R202 Paresthesia of skin: Secondary | ICD-10-CM

## 2018-09-28 DIAGNOSIS — Z87891 Personal history of nicotine dependence: Secondary | ICD-10-CM | POA: Insufficient documentation

## 2018-09-28 DIAGNOSIS — R208 Other disturbances of skin sensation: Secondary | ICD-10-CM | POA: Insufficient documentation

## 2018-09-28 DIAGNOSIS — J45909 Unspecified asthma, uncomplicated: Secondary | ICD-10-CM | POA: Insufficient documentation

## 2018-09-28 DIAGNOSIS — Z79899 Other long term (current) drug therapy: Secondary | ICD-10-CM | POA: Insufficient documentation

## 2018-09-28 MED ORDER — PYRETHRINS-PIPERONYL BUTOXIDE 0.33-4 % EX SHAM: MEDICATED_SHAMPOO | Freq: Once | CUTANEOUS | 0 refills | Status: AC

## 2018-09-28 MED ORDER — HYDROXYZINE HCL 25 MG PO TABS: 25.0000 mg | ORAL_TABLET | Freq: Four times a day (QID) | ORAL | 0 refills | Status: DC | PRN

## 2018-09-28 MED ORDER — PERMETHRIN 5 % EX CREA: TOPICAL_CREAM | CUTANEOUS | 1 refills | Status: DC

## 2018-09-28 NOTE — ED Triage Notes (Signed)
Per pt she stated" she has bugs in her skin and are coming out of her skin. Pt says they are all over her. She says they are little black bug on her scabs. Pt stated " she feels like they are inside of her and need to come out. "She scrubbed her body with sea salt and an oil and they were all over her."

## 2018-09-28 NOTE — ED Provider Notes (Signed)
[facility] EMERGENCY DEPARTMENT Provider Note   CSN: [id number] Arrival date & time: 09/28/18  0602    History   Chief Complaint Chief Complaint  Patient presents with  . Insect Bite    HPI Katrina [name] is a 28 y.o. female.     Patient presents with approximately 7 days of sensation of skin crawling across her entire body.  She states that in addition to this sensation she has seen insects crawling on and out of her skin in several different areas.  They seem to be most prevalent in her hair.  She has also seen larvae in her stool.  She has been picking at her skin and seeing insects crawling leg wounds that she is forming.  Patient states that she went to the store and bought a "tea tree everything" and has been using these products on her skin.  She has also been applying sea salt to her skin.  She states that this made the sensation worse.  She denies any fevers, nausea, vomiting, diarrhea.  She denies any drug use or alcohol use.       Past Medical History:  Diagnosis Date  . Asthma   . Headache in pregnancy   . Herpes simplex of female genitalia    last outbreak 4 months ago  . Migraine   . Migraines     Patient Active Problem List   Diagnosis Date Noted  . Opiate dependence ([place]) 03/26/2017  . No prenatal care in current pregnancy in third trimester 03/25/2017  . Right knee pain 09/27/2015  . Biliary colic 62/22/9798  . Recurrent biliary colic 92/04/9416  . Cholestasis of pregnancy in third trimester 01/03/2015  . [redacted] weeks gestation of pregnancy   . Gestational hypertension   . Pregnancy 12/19/2014  . Migraine without aura and without status migrainosus, not intractable 08/21/2014  . Pregnancy complication 40/81/4481  . Low-lying placenta   . Preterm premature rupture of membranes with onset of labor more than 24 hours following rupture in second trimester   . Vaginal bleeding in pregnancy   . [redacted] weeks gestation of pregnancy   . Amniotic  fluid leaking   . Pregnant 05/26/2014  . Smoker 05/26/2014  . Breast lump on right side at 10 o'clock position 03/21/2014  . 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 CESAREAN SECTION N/A 03/27/2017   Procedure: CESAREAN SECTION;  Surgeon: Jerelyn Charles, MD;  Location: [facility];  Service: Obstetrics;  Laterality: N/A;  . CHOLECYSTECTOMY N/A 01/17/2015   Procedure: LAPAROSCOPIC CHOLECYSTECTOMY WITH INTRAOPERATIVE CHOLANGIOGRAM;  Surgeon: Excell Seltzer, MD;  Location: [facility];  Service: General;  Laterality: N/A;  . COLPOSCOPY  03/2014     OB History    Gravida  4   Para  4   Term  3   Preterm  1   AB  0   Living  4     SAB  0   TAB  0   Ectopic  0   Multiple  0   Live Births  4            Home Medications    Prior to Admission medications   Medication Sig Start Date End Date Taking? Authorizing Provider  albuterol (PROVENTIL HFA;VENTOLIN HFA) 108 (90 BASE) MCG/ACT inhaler Inhale 2 puffs into the lungs every 6 (six)  hours as needed for wheezing or shortness of breath.    [provider]  bethanechol (URECHOLINE) 5 MG tablet Take 1 tablet (5 mg total) by mouth 3 (three) times daily. 03/31/17   Bobbye Charleston, MD  hydrOXYzine (ATARAX/VISTARIL) 25 MG tablet Take 1 tablet (25 mg total) by mouth every 6 (six) hours as needed (for skin crawling sensation). 09/28/18   Carlisle Cater, PA-C  ibuprofen (ADVIL,MOTRIN) 800 MG tablet Take 1 tablet (800 mg total) by mouth every 6 (six) hours. 03/31/17   Bobbye Charleston, MD  oxyCODONE-acetaminophen (PERCOCET) 10-325 MG tablet Take 1 tablet by mouth 3 (three) times daily.    [provider]  permethrin (ELIMITE) 5 % cream Apply to affected area once, repeat in 1 week if not improved 09/28/18   Carlisle Cater, PA-C  pyrethrins-piperonyl  butoxide (RID LICE KILLING SHAMPOO) 0.33-4 % shampoo Apply topically once for 1 dose. 09/28/18 09/28/18  Carlisle Cater, PA-C    Family History Family History  Problem Relation Age of Onset  . Asthma Mother   . Asthma Sister   . Asthma Sister     Social History Social History   Tobacco Use  . Smoking status: Former Smoker    Packs/day: 0.25    Years: 2.00    Pack years: 0.50    Types: Cigarettes    Last attempt to quit: 05/02/2013    Years since quitting: 5.4  . Smokeless tobacco: Never Used  Substance Use Topics  . Alcohol use: No    Alcohol/week: 0.0 standard drinks    Comment: socially  . Drug use: Yes    Frequency: 21.0 times per week    Types: Other-see comments    Comment: percocet; 3 per day per pt.      Allergies   Shellfish allergy; Other; and Penicillins   Review of Systems Review of Systems  Constitutional: Negative for fever.  HENT: Negative for rhinorrhea and sore throat.   Eyes: Negative for redness.  Respiratory: Negative for cough.   Cardiovascular: Negative for chest pain.  Gastrointestinal: Negative for abdominal pain, diarrhea, nausea and vomiting.  Genitourinary: Negative for dysuria.  Musculoskeletal: Negative for myalgias.  Skin: Positive for wound. Negative for rash.  Neurological: Negative for headaches.     Physical Exam Updated Vital Signs BP (!) 147/86 (BP Location: Right Arm)   Pulse 87   Temp 97.9 F (36.6 C) (Oral)   Resp 16   SpO2 100%   Physical Exam Vitals signs and nursing note reviewed.  Constitutional:      Appearance: She is well-developed.  HENT:     Head: Normocephalic and atraumatic.  Eyes:     Conjunctiva/sclera: Conjunctivae normal.  Neck:     Musculoskeletal: Normal range of motion and neck supple.  Pulmonary:     Effort: No respiratory distress.  Skin:    General: Skin is warm and dry.     Comments: Patient has several small skin lesions on her legs and arms where she has been pinching the skin.    Neurological:     Mental Status: She is alert.  Psychiatric:        Attention and Perception: Attention normal.        Mood and Affect: Mood is anxious.        Speech: Speech normal.        Behavior: Behavior normal.      ED Treatments / Results  Labs (all labs ordered are listed, but only abnormal results are displayed) Labs  Reviewed - No data to display  EKG None  Radiology No results found.  Procedures Procedures (including critical care time)  Medications Ordered in ED Medications - No data to display   Initial Impression / Assessment and Plan / ED Course  I have reviewed the triage vital signs and the nursing notes.  Pertinent labs & imaging results that were available during my care of the patient were reviewed by me and considered in my medical decision making (see chart for details).        Patient seen and examined.  Patient with what is likely formication symptoms.  At one point during the exam she becomes very anxious and points in her skin saying that she sees 1 of the worms.  On my exam, this is a small black thread that is easily wiped away.  Will try hydroxyzine for the skin crawling sensation to see if this helps improve the way that she feels.  Patient will be also given a trial of permethrin to help eliminate any possible infestation.  Discussed with patient that if her symptoms do not improve that she should follow-up with her primary care doctor.  Vital signs reviewed and are as follows: BP (!) 147/86 (BP Location: Right Arm)   Pulse 87   Temp 97.9 F (36.6 C) (Oral)   Resp 16   SpO2 100%     Final Clinical Impressions(s) / ED Diagnoses   Final diagnoses:  Sensation of skin crawling   Patient with formication symptoms today.  She appears anxious but does not appear to be responding to internal stimuli.  She is adamant that she sees insects on her skin and feels her skin crawling however she is not otherwise altered and does not have  bizarre affect.  She converses normally.  We will try symptomatic control at this point.  No indications for involuntary commitment.  Strongly encourage PCP follow-up if symptoms do not improve.  ED Discharge Orders         Ordered    permethrin (ELIMITE) 5 % cream     09/28/18 0639    pyrethrins-piperonyl butoxide (RID LICE KILLING SHAMPOO) 0.33-4 % shampoo   Once     09/28/18 0639    hydrOXYzine (ATARAX/VISTARIL) 25 MG tablet  Every 6 hours PRN     09/28/18 0639           Carlisle Cater, PA-C 09/28/18 0658    Ward, Delice Bison, DO 09/28/18 [phone]

## 2018-09-28 NOTE — Discharge Instructions (Signed)
Please read and follow all provided instructions.  Your diagnoses today include:  1. Sensation of skin crawling     Tests performed today include:  Vital signs. See below for your results today.   Medications prescribed:   Hydroxyzine - antihistamine  You can find this medication over-the-counter.   This medication will make you drowsy. DO NOT drive or perform any activities that require you to be awake and alert if taking this.   Permethrin cream - Apply to entire body before bed and wash off in morning, repeat in one week if not resolved.  Home care instructions:  Follow any educational materials contained in this packet.  Follow-up instructions: Please follow-up with your primary care provider as needed for further evaluation of your symptoms.  Return instructions:   Please return to the Emergency Department if you experience worsening symptoms.   Please return if you have any other emergent concerns.  Additional Information:  Your vital signs today were: BP (!) 147/86 (BP Location: Right Arm)    Pulse 87    Temp 97.9 F (36.6 C) (Oral)    Resp 16    SpO2 100%  If your blood pressure (BP) was elevated above 135/85 this visit, please have this repeated by your doctor within one month. ---------------

## 2018-10-15 ENCOUNTER — Telehealth: Payer: Self-pay | Admitting: Family Medicine

## 2018-10-15 NOTE — Telephone Encounter (Signed)
Patient called in stating that she would like to be tested for STDs after speaking with Antoinette we are postponing these appointments for now and the can either go to [facility] or urgent care. Patient verbalized understanding.

## 2019-01-08 ENCOUNTER — Encounter (HOSPITAL_COMMUNITY): Payer: Self-pay

## 2019-01-08 ENCOUNTER — Emergency Department (HOSPITAL_COMMUNITY): Admission: EM | Admit: 2019-01-08 | Discharge: 2019-01-08 | Discharge status: Against Medical Advice | Payer: Self-pay

## 2019-01-08 ENCOUNTER — Emergency Department (HOSPITAL_COMMUNITY)
Admission: EM | Admit: 2019-01-08 | Discharge: 2019-01-08 | Disposition: A | Discharge status: Home / Self Care | Payer: Self-pay | Attending: Emergency Medicine | Admitting: Emergency Medicine

## 2019-01-08 ENCOUNTER — Other Ambulatory Visit: Payer: Self-pay

## 2019-01-08 ENCOUNTER — Emergency Department (HOSPITAL_COMMUNITY): Payer: Self-pay

## 2019-01-08 DIAGNOSIS — R112 Nausea with vomiting, unspecified: Secondary | ICD-10-CM | POA: Insufficient documentation

## 2019-01-08 DIAGNOSIS — R0602 Shortness of breath: Secondary | ICD-10-CM

## 2019-01-08 DIAGNOSIS — R7989 Other specified abnormal findings of blood chemistry: Secondary | ICD-10-CM

## 2019-01-08 DIAGNOSIS — R0600 Dyspnea, unspecified: Secondary | ICD-10-CM | POA: Insufficient documentation

## 2019-01-08 DIAGNOSIS — N3001 Acute cystitis with hematuria: Secondary | ICD-10-CM | POA: Insufficient documentation

## 2019-01-08 DIAGNOSIS — R197 Diarrhea, unspecified: Secondary | ICD-10-CM | POA: Insufficient documentation

## 2019-01-08 DIAGNOSIS — Z87891 Personal history of nicotine dependence: Secondary | ICD-10-CM | POA: Insufficient documentation

## 2019-01-08 DIAGNOSIS — J45909 Unspecified asthma, uncomplicated: Secondary | ICD-10-CM | POA: Insufficient documentation

## 2019-01-08 DIAGNOSIS — R945 Abnormal results of liver function studies: Secondary | ICD-10-CM | POA: Insufficient documentation

## 2019-01-08 DIAGNOSIS — F1110 Opioid abuse, uncomplicated: Secondary | ICD-10-CM | POA: Insufficient documentation

## 2019-01-08 LAB — COMPREHENSIVE METABOLIC PANEL
ALT: 265 U/L — ABNORMAL HIGH (ref 0–44)
AST: 135 U/L — ABNORMAL HIGH (ref 15–41)
Albumin: 4.1 g/dL (ref 3.5–5.0)
Alkaline Phosphatase: 123 U/L (ref 38–126)
Anion gap: 9 (ref 5–15)
BUN: 10 mg/dL (ref 6–20)
CO2: 25 mmol/L (ref 22–32)
Calcium: 9.3 mg/dL (ref 8.9–10.3)
Chloride: 103 mmol/L (ref 98–111)
Creatinine, Ser: 0.63 mg/dL (ref 0.44–1.00)
GFR calc Af Amer: >60 mL/min (ref >60)
GFR calc non Af Amer: >60 mL/min (ref >60)
Glucose, Bld: 108 mg/dL — ABNORMAL HIGH (ref 70–99)
Potassium: 3.8 mmol/L (ref 3.5–5.1)
Sodium: 137 mmol/L (ref 135–145)
Total Bilirubin: 0.6 mg/dL (ref 0.3–1.2)
Total Protein: 8.5 g/dL — ABNORMAL HIGH (ref 6.5–8.1)

## 2019-01-08 LAB — URINALYSIS, ROUTINE W REFLEX MICROSCOPIC
Bilirubin Urine: NEGATIVE
Glucose, UA: NEGATIVE mg/dL
Ketones, ur: NEGATIVE mg/dL
Leukocytes,Ua: NEGATIVE
Nitrite: POSITIVE — AB
Protein, ur: 100 mg/dL — AB
Specific Gravity, Urine: 1.028 (ref 1.005–1.030)
pH: 5 (ref 5.0–8.0)

## 2019-01-08 LAB — CBC WITH DIFFERENTIAL/PLATELET
Abs Immature Granulocytes: 0.05 10*3/uL (ref 0.00–0.07)
Basophils Absolute: 0 10*3/uL (ref 0.0–0.1)
Basophils Relative: 0 %
Eosinophils Absolute: 0.3 10*3/uL (ref 0.0–0.5)
Eosinophils Relative: 3 %
HCT: 42.6 % (ref 36.0–46.0)
Hemoglobin: 15.2 g/dL — ABNORMAL HIGH (ref 12.0–15.0)
Immature Granulocytes: 1 %
Lymphocytes Relative: 24 %
Lymphs Abs: 2.2 10*3/uL (ref 0.7–4.0)
MCH: 31.7 pg (ref 26.0–34.0)
MCHC: 35.7 g/dL (ref 30.0–36.0)
MCV: 88.9 fL (ref 80.0–100.0)
Monocytes Absolute: 0.6 10*3/uL (ref 0.1–1.0)
Monocytes Relative: 7 %
Neutro Abs: 5.9 10*3/uL (ref 1.7–7.7)
Neutrophils Relative %: 65 %
Platelets: 318 10*3/uL (ref 150–400)
RBC: 4.79 MIL/uL (ref 3.87–5.11)
RDW: 13 % (ref 11.5–15.5)
WBC: 9.1 10*3/uL (ref 4.0–10.5)
nRBC: 0 % (ref 0.0–0.2)

## 2019-01-08 LAB — LIPASE, BLOOD: Lipase: 24 U/L (ref 11–51)

## 2019-01-08 LAB — PREGNANCY, URINE: Preg Test, Ur: NEGATIVE

## 2019-01-08 MED ORDER — DEXAMETHASONE SODIUM PHOSPHATE 10 MG/ML IJ SOLN
10.0000 mg | Freq: Once | INTRAMUSCULAR | Status: AC
  Administered 2019-01-08: 10 mg via INTRAVENOUS
  Filled 2019-01-08: qty 1

## 2019-01-08 MED ORDER — FLUTICASONE PROPIONATE 50 MCG/ACT NA SUSP: 1.0000 | Freq: Every day | NASAL | 0 refills | Status: DC

## 2019-01-08 MED ORDER — ALBUTEROL SULFATE HFA 108 (90 BASE) MCG/ACT IN AERS
2.0000 | INHALATION_SPRAY | Freq: Once | RESPIRATORY_TRACT | Status: AC
  Administered 2019-01-08: 2 via RESPIRATORY_TRACT
  Filled 2019-01-08: qty 6.7

## 2019-01-08 MED ORDER — SODIUM CHLORIDE 0.9 % IV BOLUS: 1000.0000 mL | Freq: Once | INTRAVENOUS | Status: DC

## 2019-01-08 MED ORDER — AEROCHAMBER PLUS FLO-VU LARGE MISC
Status: AC
  Filled 2019-01-08: qty 1

## 2019-01-08 MED ORDER — ONDANSETRON HCL 4 MG/2ML IJ SOLN
4.0000 mg | Freq: Once | INTRAMUSCULAR | Status: AC
  Administered 2019-01-08: 4 mg via INTRAVENOUS
  Filled 2019-01-08: qty 2

## 2019-01-08 MED ORDER — FLUCONAZOLE 150 MG PO TABS
150.0000 mg | ORAL_TABLET | Freq: Once | ORAL | Status: AC
  Administered 2019-01-08: 150 mg via ORAL
  Filled 2019-01-08: qty 1

## 2019-01-08 MED ORDER — BENZONATATE 100 MG PO CAPS: 100.0000 mg | ORAL_CAPSULE | Freq: Three times a day (TID) | ORAL | 0 refills | Status: DC

## 2019-01-08 MED ORDER — AEROCHAMBER PLUS FLO-VU MEDIUM MISC
1.0000 | Freq: Once | Status: AC
  Administered 2019-01-08: 1
  Filled 2019-01-08: qty 1

## 2019-01-08 MED ORDER — NAPROXEN 500 MG PO TABS: 500.0000 mg | ORAL_TABLET | Freq: Two times a day (BID) | ORAL | 0 refills | Status: DC

## 2019-01-08 MED ORDER — ONDANSETRON 4 MG PO TBDP: 4.0000 mg | ORAL_TABLET | Freq: Three times a day (TID) | ORAL | 0 refills | Status: DC | PRN

## 2019-01-08 MED ORDER — DICYCLOMINE HCL 10 MG PO CAPS
20.0000 mg | ORAL_CAPSULE | Freq: Once | ORAL | Status: AC
  Administered 2019-01-08: 20 mg via ORAL
  Filled 2019-01-08: qty 2

## 2019-01-08 MED ORDER — NITROFURANTOIN MONOHYD MACRO 100 MG PO CAPS: 100.0000 mg | ORAL_CAPSULE | Freq: Two times a day (BID) | ORAL | 0 refills | Status: DC

## 2019-01-08 NOTE — ED Provider Notes (Signed)
[facility] EMERGENCY DEPARTMENT Provider Note   CSN: [id number] Arrival date & time: 01/08/19  1207     History   Chief Complaint Chief Complaint  Patient presents with  . Shortness of Breath    HPI Katrina [name] is a 28 y.o. female with a hx of asthma, migraines, & prior cholecystectomy who presents to the ED w/ complaints of respiratory & GI sxs x 1 week. Patient reports congestion, productive cough of phlegm sputum, intermittent dyspnea, chest pain, nausea, vomiting, diarrhea, subjective fever, & chills. She has also had change in smell/taste. Sxs are constant. Chest pain initially only w/ coughing, now fairly constant & worse w/ coughing/palpation, her breathing is improved w/ [place] of her inhaler at home- otherwise no specific alleviating/aggravating factors to her symptoms. Her boyfriend is sick with similar sxs & has been around someone with confirmed COVID19. She had an outpatient COVID test 3 days prior which has not resulted yet.   Denies abdominal pain, hematemesis, melena, hematochezia, or dysuria. Denies leg pain/swelling, hemoptysis, recent surgery/trauma, recent long travel, hormone use, personal hx of cancer, or hx of DVT/PE.         HPI  Past Medical History:  Diagnosis Date  . Asthma   . Headache in pregnancy   . Herpes simplex of female genitalia    last outbreak 4 months ago  . Migraine   . Migraines     Patient Active Problem List   Diagnosis Date Noted  . Opiate dependence ([employer]) 03/26/2017  . No prenatal care in current pregnancy in third trimester 03/25/2017  . Right knee pain 09/27/2015  . Biliary colic 62/26/3335  . Recurrent biliary colic 45/62/5638  . Cholestasis of pregnancy in third trimester 01/03/2015  . [redacted] weeks gestation of pregnancy   . Gestational hypertension   . Pregnancy 12/19/2014  . Migraine without aura and without status migrainosus, not intractable 08/21/2014  . Pregnancy complication 93/73/4287  . Low-lying  placenta   . Preterm premature rupture of membranes with onset of labor more than 24 hours following rupture in second trimester   . Vaginal bleeding in pregnancy   . [redacted] weeks gestation of pregnancy   . Amniotic fluid leaking   . Pregnant 05/26/2014  . Smoker 05/26/2014  . Breast lump on right side at 10 o'clock position 03/21/2014  . 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 CESAREAN SECTION N/A 03/27/2017   Procedure: CESAREAN SECTION;  Surgeon: Jerelyn Charles, MD;  Location: [facility];  Service: Obstetrics;  Laterality: N/A;  . CHOLECYSTECTOMY N/A 01/17/2015   Procedure: LAPAROSCOPIC CHOLECYSTECTOMY WITH INTRAOPERATIVE CHOLANGIOGRAM;  Surgeon: Excell Seltzer, MD;  Location: [facility];  Service: General;  Laterality: N/A;  . COLPOSCOPY  03/2014     OB History    Gravida  4   Para  4   Term  3   Preterm  1   AB  0   Living  4     SAB  0   TAB  0   Ectopic  0   Multiple  0   Live Births  4            Home Medications    Prior to Admission medications   Medication Sig Start Date End Date Taking? Authorizing Provider  albuterol (PROVENTIL HFA;VENTOLIN HFA) 108 (90 BASE) MCG/ACT inhaler Inhale  2 puffs into the lungs every 6 (six) hours as needed for wheezing or shortness of breath.    [provider]  bethanechol (URECHOLINE) 5 MG tablet Take 1 tablet (5 mg total) by mouth 3 (three) times daily. 03/31/17   Bobbye Charleston, MD  hydrOXYzine (ATARAX/VISTARIL) 25 MG tablet Take 1 tablet (25 mg total) by mouth every 6 (six) hours as needed (for skin crawling sensation). 09/28/18   Carlisle Cater, PA-C  ibuprofen (ADVIL,MOTRIN) 800 MG tablet Take 1 tablet (800 mg total) by mouth every 6 (six) hours. 03/31/17   Bobbye Charleston, MD  oxyCODONE-acetaminophen (PERCOCET) 10-325 MG tablet Take 1  tablet by mouth 3 (three) times daily.    [provider]  permethrin (ELIMITE) 5 % cream Apply to affected area once, repeat in 1 week if not improved 09/28/18   Carlisle Cater, PA-C    Family History Family History  Problem Relation Age of Onset  . Asthma Mother   . Asthma Sister   . Asthma Sister     Social History Social History   Tobacco Use  . Smoking status: Former Smoker    Packs/day: 0.25    Years: 2.00    Pack years: 0.50    Types: Cigarettes    Quit date: 05/02/2013    Years since quitting: 5.6  . Smokeless tobacco: Never Used  Substance Use Topics  . Alcohol use: No    Alcohol/week: 0.0 standard drinks    Comment: socially  . Drug use: Yes    Frequency: 21.0 times per week    Types: Other-see comments    Comment: percocet; 3 per day per pt.      Allergies   Shellfish allergy, Other, and Penicillins   Review of Systems Review of Systems  Constitutional: Positive for chills, fatigue and fever (subjective).  HENT: Positive for congestion. Negative for sore throat.   Respiratory: Positive for cough and shortness of breath. Negative for wheezing.   Cardiovascular: Positive for chest pain.  Gastrointestinal: Positive for diarrhea, nausea and vomiting. Negative for abdominal pain, blood in stool and constipation.  Genitourinary: Negative for dysuria, vaginal bleeding and vaginal discharge.  All other systems reviewed and are negative.  Physical Exam Updated Vital Signs BP 119/82   Pulse (!) 102   Temp 98.1 F (36.7 C) (Oral)   Resp 18   Ht '5\' 1"'  (1.549 m)   Wt 64 kg   SpO2 100%   BMI 26.66 kg/m   Physical Exam Vitals signs and nursing note reviewed.  Constitutional:      General: She is not in acute distress.    Appearance: She is well-developed.  HENT:     Head: Normocephalic and atraumatic.     Right Ear: Ear canal normal. Tympanic membrane is not perforated, erythematous, retracted or bulging.     Left Ear: Ear canal normal.  Tympanic membrane is not perforated, erythematous, retracted or bulging.     Ears:     Comments: No mastoid erythema/swelling/tenderness.     Nose:     Right Sinus: No maxillary sinus tenderness or frontal sinus tenderness.     Left Sinus: No maxillary sinus tenderness or frontal sinus tenderness.     Mouth/Throat:     Pharynx: Uvula midline. No oropharyngeal exudate or posterior oropharyngeal erythema.     Comments: Posterior oropharynx is symmetric appearing. Patient tolerating own secretions without difficulty. No trismus. No drooling. No hot potato voice. No swelling beneath the tongue, submandibular compartment is soft.  Eyes:  General:        Right eye: No discharge.        Left eye: No discharge.     Conjunctiva/sclera: Conjunctivae normal.     Pupils: Pupils are equal, round, and reactive to light.  Neck:     Musculoskeletal: Normal range of motion and neck supple. No edema or neck rigidity.  Cardiovascular:     Rate and Rhythm: Regular rhythm. Tachycardia present.     Heart sounds: No murmur.     Comments: HR 96-108 on monitor on my initial eval.  Pulmonary:     Effort: Pulmonary effort is normal. No respiratory distress.     Breath sounds: Normal breath sounds. No wheezing, rhonchi or rales.  Chest:     Chest wall: Tenderness (L anterior/posterior chest wall which reproduces patient's chest pain) present. No deformity, crepitus or edema.  Abdominal:     General: There is no distension.     Palpations: Abdomen is soft.     Tenderness: There is no abdominal tenderness. There is no guarding or rebound.  Musculoskeletal:     Right lower leg: She exhibits no tenderness. No edema.     Left lower leg: She exhibits no tenderness. No edema.  Lymphadenopathy:     Cervical: No cervical adenopathy.  Skin:    General: Skin is warm and dry.     Findings: No rash.  Neurological:     Mental Status: She is alert.  Psychiatric:        Behavior: Behavior normal.    ED Treatments  / Results  Labs (all labs ordered are listed, but only abnormal results are displayed) Labs Reviewed  COMPREHENSIVE METABOLIC PANEL - Abnormal; Notable for the following components:      Result Value   Glucose, Bld 108 (*)    Total Protein 8.5 (*)    AST 135 (*)    ALT 265 (*)    All other components within normal limits  CBC WITH DIFFERENTIAL/PLATELET - Abnormal; Notable for the following components:   Hemoglobin 15.2 (*)    All other components within normal limits  URINALYSIS, ROUTINE W REFLEX MICROSCOPIC - Abnormal; Notable for the following components:   Color, Urine AMBER (*)    APPearance HAZY (*)    Hgb urine dipstick MODERATE (*)    Protein, ur 100 (*)    Nitrite POSITIVE (*)    Bacteria, UA MANY (*)    All other components within normal limits  PREGNANCY, URINE  LIPASE, BLOOD    EKG None  Radiology Dg Chest Portable 1 View  Result Date: 01/08/2019 CLINICAL DATA:  Chest pain and shortness of breath for a couple of days. EXAM: PORTABLE CHEST 1 VIEW COMPARISON:  CT chest 01/31/2015.  PA and lateral chest 05/28/2013. FINDINGS: Lungs clear. Heart size normal. No pneumothorax or pleural fluid. No bony abnormality. IMPRESSION: Normal chest. Electronically Signed   By: Inge Rise M.D.   On: 01/08/2019 13:07    Procedures Procedures (including critical care time)  Medications Ordered in ED Medications  sodium chloride 0.9 % bolus 1,000 mL (1,000 mLs Intravenous Refused 01/08/19 1540)  fluconazole (DIFLUCAN) tablet 150 mg (has no administration in time range)  dexamethasone (DECADRON) injection 10 mg (has no administration in time range)  albuterol (VENTOLIN HFA) 108 (90 Base) MCG/ACT inhaler 2 puff (2 puffs Inhalation Given 01/08/19 1346)  AeroChamber Plus Flo-Vu Medium MISC 1 each (1 each Other Given 01/08/19 1540)  ondansetron (ZOFRAN) injection 4 mg (4 mg Intravenous  Given 01/08/19 1346)  dicyclomine (BENTYL) capsule 20 mg (20 mg Oral Given 01/08/19 1346)      Initial Impression / Assessment and Plan / ED Course  I have reviewed the triage vital signs and the nursing notes.  Pertinent labs & imaging results that were available during my care of the patient were reviewed by me and considered in my medical decision making (see chart for details).   Patient presents to the ED w/ respiratory & GI sxs x 1 week. Nontoxic appearing, initially mildly tachycardic, vitals otherwise WNL. On exam patient does not have signs of AOM, AOE, or mastoiditis, oropharynx is clear- doubt strep, no meningismus. Lungs CTA, no wheezing, however given hx of asthma will give 2 puffs of albuterol. Abdomen is soft & non-tender w/o peritoneal signs. Plan for labs, CXR, & monitoring. Symptomatic management.    CBC: No leukocytosis. Hgb mildly elevated- suspect degree of hemoconcentration. No anemia.  CMP: AST/ALT elevation somewhat similar to prior, no RUQ abdominal tenderness T bili & alk phos WNL, patient denies EtOH abuse or frequent use of tylenol- will need outpatient work up. No electrolyte derangement. Renal function preserved.  Lipase: WNL Preg test: Negative UA: concerning for infection w/ positive nitrites, many bacteria & moderate hgb--- patient w/o urinary sxs but w/ N/V feel it is appropriate to tx for UTI. She denies vaginal discharge/bleeding or concern for STDs- offered pelvic she declined, will add on GC/chlamydia to urine for precautionary purposes, no trich on UA, does show yeast- will give diflucan in the ED(discussed safety of this w/ pharmacist allison given patient's LFTs & she is in agreement) CXR: No acute abnormality, no infiltrate to suggest bacterial pneumonia  Chest pain reproducible on palpation. Low risk wells- doubt PE, additionally do not suspect ACS, CXR w/o PTX. She is not wheezing, lung exam does not seem consistent w/ asthma exacerbation requiring steroids to go home with.   Repeat abdominal exam w/o peritoneal signs- doubt appendicitis,  perf/obstruction, diverticulitis, or pancreatitis. She is s/p cholecystectomy. No pelvic pain/abdominal pain, w/o complaints of vaginal discharge, non-tender belly exam-  Doubt PID. Preg test negative- doubt ectopic.   UA w/ concern for infection will tx w/ marcorbid. Urine gc/chlamydia pending.   With exception of UA her sxs overall seem consistent w/ viral process. Her covid outpatient test is pending. She is not in respiratory distress, ambulatory SpO2 100% on RA ( I personally ambulated patient ), able to tolerate PO in the ED & overall seems appropriate for discharge. Given possibility of covid & her hx of asthma will give 1 dose of decadron in the ED. Discharge home with symptomatic care & quarantine instructions. I discussed results, treatment plan, need for follow-up, and return precautions with the patient. Provided opportunity for questions, patient confirmed understanding and is in agreement with plan.    Katrina [name] was evaluated in Emergency Department on 01/08/2019 for the symptoms described in the history of present illness. He/she was evaluated in the context of the global COVID-19 pandemic, which necessitated consideration that the patient might be at risk for infection with the SARS-CoV-2 virus that causes COVID-19. Institutional protocols and algorithms that pertain to the evaluation of patients at risk for COVID-19 are in a state of rapid change based on information released by regulatory bodies including the CDC and federal and state organizations. These policies and algorithms were followed during the patient's care in the ED.   Vitals:   01/08/19 1325 01/08/19 1400  BP:  109/73  Pulse:  88  Resp:  16  Temp: 98.1 F (36.7 C)   SpO2:  100%    Final Clinical Impressions(s) / ED Diagnoses   Final diagnoses:  Nausea vomiting and diarrhea  Shortness of breath  Acute cystitis with hematuria  Elevated LFTs    ED Discharge Orders         Ordered    fluticasone (FLONASE)  50 MCG/ACT nasal spray  Daily     01/08/19 1536    benzonatate (TESSALON) 100 MG capsule  Every 8 hours     01/08/19 1536    ondansetron (ZOFRAN ODT) 4 MG disintegrating tablet  Every 8 hours PRN     01/08/19 1536    naproxen (NAPROSYN) 500 MG tablet  2 times daily     01/08/19 1536    nitrofurantoin, macrocrystal-monohydrate, (MACROBID) 100 MG capsule  2 times daily     01/08/19 1538           Janautica Netzley, [place], PA-C 01/08/19 1543    Charlesetta Shanks, MD 01/09/19 [phone]

## 2019-01-08 NOTE — Discharge Instructions (Addendum)
You were seen in the emergency department today for nausea, vomiting, diarrhea, cough, and trouble breathing.  Your chest x-ray did not show signs of pneumonia or other concerning abnormalities. Your blood work showed that your liver function tests are elevated, this is somewhat consistent with prior labs you have had done, please discuss with your primary care for further evaluation.  Your urine showed findings concerning for a urinary tract infection- we are sending you home with macrobid- an antibiotic to treat this. Your urine also showed some yeast- we have treated this with oral medicine in the ER. We have added on a urine culture as well as gonorrhea/chlamydia testing to  your urine- we will call you if these are abnormal.   We are sending you home with additional medicine to treat your symptoms: Flonase: This is a medicine to help with congestion, use 1 spray per nostril daily. Tessalon: This is a medicine to treat cough, use every 8 hours as needed for coughing Zofran: This is a medicine to treat nausea, use this every 8 hours as needed for nausea and vomiting Naproxen:this is a nonsteroidal anti-inflammatory medication that will help with pain and swelling. Be sure to take this medication as prescribed with food, 1 pill every 12 hours,  It should be taken with food, as it can cause stomach upset, and more seriously, stomach bleeding. Do not take other nonsteroidal anti-inflammatory medications with this such as Advil, Motrin, Aleve, Mobic, Goodie Powder, or Motrin.    We have prescribed you new medication(s) today. Discuss the medications prescribed today with your pharmacist as they can have adverse effects and interactions with your other medicines including over the counter and prescribed medications. Seek medical evaluation if you start to experience new or abnormal symptoms after taking one of these medicines, seek care immediately if you start to experience difficulty breathing, feeling  of your throat closing, facial swelling, or rash as these could be indications of a more serious allergic reaction  Please continue to use your albuterol inhaler 1 to 2 puffs every 4-6 hours as needed for shortness of breath.  We have given you Decadron, steroid, to also help with your breathing while you are in the ER.  We are instructing patients being investigated for coivd to quarantine themselves for 14 days. You may be able to discontinue self quarantine if the following conditions are met:   Persons with COVID-19 who have symptoms and were directed to care for themselves at home may discontinue home isolation under the  following conditions: - It has been at least 7 days have passed since symptoms first appeared. - AND at least 3 days (72 hours) have passed since recovery defined as resolution of fever without the use of fever-reducing medications and improvement in respiratory symptoms (e.g., cough, shortness of breath)  Please follow the below quarantine instructions.   Please follow up with primary care within 3-5 days for re-evaluation- call prior to going to the office to make them aware of your symptoms. Return to the ER for new or worsening symptoms including but not limited to increased work of breathing, fever, chest pain, passing out, or any other concerns.       Person Under Monitoring Name: Katrina [name]  Location: [place] [address]   Infection Prevention Recommendations for Individuals Confirmed to have, or Being Evaluated for, 2019 Novel Coronavirus (COVID-19) Infection Who Receive Care at Home  Individuals who are confirmed to have, or are being evaluated for, COVID-19  should follow the prevention steps below until a healthcare provider or local or state health department says they can return to normal activities.  Stay home except to get medical care You should restrict activities outside your home, except for getting medical care. Do not go to  work, school, or public areas, and do not use public transportation or taxis.  Call ahead before visiting your doctor Before your medical appointment, call the healthcare provider and tell them that you have, or are being evaluated for, COVID-19 infection. This will help the healthcare providers office take steps to keep other people from getting infected. Ask your healthcare provider to call the local or state health department.  Monitor your symptoms Seek prompt medical attention if your illness is worsening (e.g., difficulty breathing). Before going to your medical appointment, call the healthcare provider and tell them that you have, or are being evaluated for, COVID-19 infection. Ask your healthcare provider to call the local or state health department.  Wear a facemask You should wear a facemask that covers your nose and mouth when you are in the same room with other people and when you visit a healthcare provider. People who live with or visit you should also wear a facemask while they are in the same room with you.  Separate yourself from other people in your home As much as possible, you should stay in a different room from other people in your home. Also, you should use a separate bathroom, if available.  Avoid sharing household items You should not share dishes, drinking glasses, cups, eating utensils, towels, bedding, or other items with other people in your home. After using these items, you should wash them thoroughly with soap and water.  Cover your coughs and sneezes Cover your mouth and nose with a tissue when you cough or sneeze, or you can cough or sneeze into your sleeve. Throw used tissues in a lined trash can, and immediately wash your hands with soap and water for at least 20 seconds or use an alcohol-based hand rub.  Wash your [facility] your hands often and thoroughly with soap and water for at least 20 seconds. You can use an alcohol-based hand sanitizer if  soap and water are not available and if your hands are not visibly dirty. Avoid touching your eyes, nose, and mouth with unwashed hands.   Prevention Steps for Caregivers and Household Members of Individuals Confirmed to have, or Being Evaluated for, COVID-19 Infection Being Cared for in the Home  If you live with, or provide care at home for, a person confirmed to have, or being evaluated for, COVID-19 infection please follow these guidelines to prevent infection:  Follow healthcare providers instructions Make sure that you understand and can help the patient follow any healthcare provider instructions for all care.  Provide for the patients basic needs You should help the patient with basic needs in the home and provide support for getting groceries, prescriptions, and other personal needs.  Monitor the patients symptoms If they are getting sicker, call his or her medical provider and tell them that the patient has, or is being evaluated for, COVID-19 infection. This will help the healthcare providers office take steps to keep other people from getting infected. Ask the healthcare provider to call the local or state health department.  Limit the number of people who have contact with the patient If possible, have only one caregiver for the patient. Other household members should stay in another home or place of  residence. If this is not possible, they should stay in another room, or be separated from the patient as much as possible. Use a separate bathroom, if available. Restrict visitors who do not have an essential need to be in the home.  Keep older adults, very young children, and other sick people away from the patient Keep older adults, very young children, and those who have compromised immune systems or chronic health conditions away from the patient. This includes people with chronic heart, lung, or kidney conditions, diabetes, and cancer.  Ensure good ventilation Make  sure that shared spaces in the home have good air flow, such as from an air conditioner or an opened window, weather permitting.  Wash your hands often Wash your hands often and thoroughly with soap and water for at least 20 seconds. You can use an alcohol based hand sanitizer if soap and water are not available and if your hands are not visibly dirty. Avoid touching your eyes, nose, and mouth with unwashed hands. Use disposable paper towels to dry your hands. If not available, use dedicated cloth towels and replace them when they become wet.  Wear a facemask and gloves Wear a disposable facemask at all times in the room and gloves when you touch or have contact with the patients blood, body fluids, and/or secretions or excretions, such as sweat, saliva, sputum, nasal mucus, vomit, urine, or feces.  Ensure the mask fits over your nose and mouth tightly, and do not touch it during use. Throw out disposable facemasks and gloves after using them. Do not reuse. Wash your hands immediately after removing your facemask and gloves. If your personal clothing becomes contaminated, carefully remove clothing and launder. Wash your hands after handling contaminated clothing. Place all used disposable facemasks, gloves, and other waste in a lined container before disposing them with other household waste. Remove gloves and wash your hands immediately after handling these items.  Do not share dishes, glasses, or other household items with the patient Avoid sharing household items. You should not share dishes, drinking glasses, cups, eating utensils, towels, bedding, or other items with a patient who is confirmed to have, or being evaluated for, COVID-19 infection. After the person uses these items, you should wash them thoroughly with soap and water.  Wash laundry thoroughly Immediately remove and wash clothes or bedding that have blood, body fluids, and/or secretions or excretions, such as sweat, saliva,  sputum, nasal mucus, vomit, urine, or feces, on them. Wear gloves when handling laundry from the patient. Read and follow directions on labels of laundry or clothing items and detergent. In general, wash and dry with the warmest temperatures recommended on the label.  Clean all areas the individual has used often Clean all touchable surfaces, such as counters, tabletops, doorknobs, bathroom fixtures, toilets, phones, keyboards, tablets, and bedside tables, every day. Also, clean any surfaces that may have blood, body fluids, and/or secretions or excretions on them. Wear gloves when cleaning surfaces the patient has come in contact with. Use a diluted bleach solution (e.g., dilute bleach with 1 part bleach and 10 parts water) or a household disinfectant with a label that says EPA-registered for coronaviruses. To make a bleach solution at home, add 1 tablespoon of bleach to 1 quart (4 cups) of water. For a larger supply, add  cup of bleach to 1 gallon (16 cups) of water. Read labels of cleaning products and follow recommendations provided on product labels. Labels contain instructions for safe and effective use of the  cleaning product including precautions you should take when applying the product, such as wearing gloves or eye protection and making sure you have good ventilation during use of the product. Remove gloves and wash hands immediately after cleaning.  Monitor yourself for signs and symptoms of illness Caregivers and household members are considered close contacts, should monitor their health, and will be asked to limit movement outside of the home to the extent possible. Follow the monitoring steps for close contacts listed on the symptom monitoring form.   ? If you have additional questions, contact your local health department or call the epidemiologist on call at [phone] (available 24/7). ? This guidance is subject to change. For the most up-to-date guidance from [facility], please  refer to their website: [email or web address]

## 2019-01-08 NOTE — ED Triage Notes (Signed)
Pt arrives POV for eval of CP/SOB x 1 week w/ N/V/D, fatigue and general malaise. Pt reports her BF had contact w/ someone who was known to be covid +. She states "I think I got Covid, it went away, and then I got it again". Pt reports ongoing int. SOB.

## 2019-01-08 NOTE — ED Notes (Signed)
Patient verbalizes understanding of discharge instructions. Opportunity for questioning and answers were provided. Armband removed by staff, pt discharged from ED.  

## 2019-01-11 ENCOUNTER — Telehealth (HOSPITAL_COMMUNITY): Payer: Self-pay

## 2019-01-11 LAB — URINE CULTURE: Culture: >=100000 — AB

## 2019-01-12 ENCOUNTER — Ambulatory Visit (INDEPENDENT_AMBULATORY_CARE_PROVIDER_SITE_OTHER)
Admission: RE | Admit: 2019-01-12 | Discharge: 2019-01-12 | Disposition: A | Discharge status: Home / Self Care | Payer: Self-pay | Source: Ambulatory Visit

## 2019-01-12 ENCOUNTER — Telehealth: Payer: Self-pay | Admitting: *Deleted

## 2019-01-12 DIAGNOSIS — R1011 Right upper quadrant pain: Secondary | ICD-10-CM

## 2019-01-12 NOTE — Telephone Encounter (Signed)
Post ED Visit - Positive Culture Follow-up  Culture report reviewed by antimicrobial stewardship pharmacist: [place] Team []  Elenor Quinones, Pharm.D. []  Heide Guile, Pharm.D., BCPS AQ-ID []  Parks Neptune, Pharm.D., BCPS []  Alycia Rossetti, Pharm.D., BCPS []  [place], Pharm.D., BCPS, AAHIVP []  Legrand Como, Pharm.D., BCPS, AAHIVP []  Salome Arnt, PharmD, BCPS []  Johnnette Gourd, PharmD, BCPS []  Hughes Better, PharmD, BCPS []  Leeroy Cha, PharmD []  Laqueta Linden, PharmD, BCPS []  Albertina Parr, PharmD  [place] Team []  Leodis Sias, PharmD []  Lindell Spar, PharmD []  Royetta Asal, PharmD []  Graylin Shiver, Rph []  Rema Fendt) Glennon Mac, PharmD []  Arlyn Dunning, PharmD []  Netta Cedars, PharmD []  Dia Sitter, PharmD []  Leone Haven, PharmD []  Gretta Arab, PharmD []  Theodis Shove, PharmD []  Peggyann Juba, PharmD []  Reuel Boom, PharmD Gorden Harms, PharmD  Positive urine culture Treated with Nitrofurantoin Monohyd Macro, organism sensitive to the same and no further patient follow-up is required at this time.  Harlon Flor [facility] 01/12/2019, 12:22 PM

## 2019-01-12 NOTE — Discharge Instructions (Addendum)
Recommending further evaluation and management of RUQ abdominal pain at [facility].  Patient aware and in agreement with this plan.  Will call EMS if she does not feel comfortable driving herself to the ED.

## 2019-01-12 NOTE — ED Provider Notes (Signed)
[facility]    Virtual Visit via Video Note:  Katrina [name]  initiated request for Telemedicine visit with [facility] team. I connected with Katrina [name]  on 01/12/2019 at 2:14 PM  for a synchronized telemedicine visit using a video enabled HIPPA compliant telemedicine application. I verified that I am speaking with Katrina [name]  using two identifiers. Lestine Box, PA-C  was physically located in a [place] Urgent care site and Katrina [name] was located at a different location.   The limitations of evaluation and management by telemedicine as well as the availability of in-person appointments were discussed. Patient was informed that she  may incur a bill ( including co-pay) for this virtual visit encounter. Katrina [name]  expressed understanding and gave verbal consent to proceed with virtual visit.   [id number] 01/12/19 Arrival Time: 92  CC: ABDOMINAL DISCOMFORT  SUBJECTIVE:  Katrina [name] is a 28 y.o. female hx significant for asthma, HA, HSV, and migraines, who presents with complaint of RUQ abdominal pain x 5 days ago.  Denies a precipitating event, trauma, close contacts with similar symptoms, recent travel or antibiotic use.  Tested positive for COVID 2 weeks ago, symptoms have improved.  Describes pain as constant and sharp in character.  Has not tried OTC medications.  Symptoms made worse with laying down.  Reports similar symptoms in the past related to gallbladder disease, had cholecystectomy.  Last BM yesterday, less than normal and with diarrhea.  Complains of associated nausea, decreased appetite, facial swelling, jaundice, pale colored stools, and diarrhea.     Denies fever, chills, vomiting, chest pain, SOB, constipation, hematochezia, melena, flank pain, loss of bowel or bladder function.  Currently being treated for UTI with macrobid.    No LMP recorded.  ROS: As per HPI.  Past Medical History:  Diagnosis Date  . Asthma   .  Headache in pregnancy   . Herpes simplex of female genitalia    last outbreak 4 months ago  . Migraine   . Migraines    Past Surgical History:  Procedure Laterality Date  . CESAREAN SECTION N/A 03/27/2017   Procedure: CESAREAN SECTION;  Surgeon: Jerelyn Charles, MD;  Location: [place];  Service: Obstetrics;  Laterality: N/A;  . CHOLECYSTECTOMY N/A 01/17/2015   Procedure: LAPAROSCOPIC CHOLECYSTECTOMY WITH INTRAOPERATIVE CHOLANGIOGRAM;  Surgeon: Excell Seltzer, MD;  Location: [facility];  Service: General;  Laterality: N/A;  . COLPOSCOPY  03/2014   Allergies  Allergen Reactions  . Shellfish Allergy Shortness Of Breath and Swelling    Tongue  . Other     Tylenol #3---Swelling   . Penicillins Itching, Swelling and Rash    Sweating, Has patient had a PCN reaction causing immediate rash, facial/tongue/throat swelling, SOB or lightheadedness with hypotension: NO Has patient had a PCN reaction causing severe rash involving mucus membranes or skin necrosis:NO Has patient had a PCN reaction that required hospitalization NO Has patient had a PCN reaction occurring within the last 10 years:NO If all of the above answers are "NO", then may proceed with Cephalosporin use.     No current facility-administered medications on file prior to encounter.    Current Outpatient Medications on File Prior to Encounter  Medication Sig Dispense Refill  . albuterol (PROVENTIL HFA;VENTOLIN HFA) 108 (90 BASE) MCG/ACT inhaler Inhale 2 puffs into the lungs every 6 (six) hours as needed for wheezing or shortness of breath.    . nitrofurantoin, macrocrystal-monohydrate, (MACROBID) 100 MG capsule  Take 1 capsule (100 mg total) by mouth 2 (two) times daily. 10 capsule 0  . ondansetron (ZOFRAN ODT) 4 MG disintegrating tablet Take 1 tablet (4 mg total) by mouth every 8 (eight) hours as needed for nausea or vomiting. 5 tablet 0  . [DISCONTINUED] fluticasone (FLONASE) 50 MCG/ACT nasal spray Place 1 spray into both  nostrils daily. 16 g 0    OBJECTIVE:  There were no vitals filed for this visit.  General appearance: alert; no distress Eyes: EOMI grossly HENT: normocephalic; atraumatic Neck: supple with FROM Lungs: normal respiratory effort; speaking in full sentences without difficulty Extremities: moves extremities without difficulty Skin: No obvious rashes Neurologic: No facial asymmetries Psychological: alert and cooperative; anxious mood and affect   ASSESSMENT & PLAN:  1. Right upper quadrant pain     No orders of the defined types were placed in this encounter.  Recommending further evaluation and management of RUQ abdominal pain at [facility].  Patient aware and in agreement with this plan.  Will call EMS if she does not feel comfortable driving herself to the ED.    I discussed the assessment and treatment plan with the patient. The patient was provided an opportunity to ask questions and all were answered. The patient agreed with the plan and demonstrated an understanding of the instructions.  I provided 20 minutes of non-face-to-face time during this encounter.  [place], PA-C  01/12/2019 2:14 PM    Lestine Box, PA-C 01/12/19 1417

## 2019-01-18 ENCOUNTER — Telehealth (INDEPENDENT_AMBULATORY_CARE_PROVIDER_SITE_OTHER): Payer: Self-pay | Admitting: Primary Care

## 2019-01-18 ENCOUNTER — Encounter (INDEPENDENT_AMBULATORY_CARE_PROVIDER_SITE_OTHER): Payer: Self-pay | Admitting: Primary Care

## 2019-01-18 ENCOUNTER — Other Ambulatory Visit: Payer: Self-pay

## 2019-01-24 ENCOUNTER — Ambulatory Visit (HOSPITAL_COMMUNITY)
Admission: EM | Admit: 2019-01-24 | Discharge: 2019-01-24 | Disposition: A | Discharge status: Other Acute Inpatient Hospital | Payer: Self-pay

## 2019-01-24 ENCOUNTER — Emergency Department (HOSPITAL_COMMUNITY)
Admission: EM | Admit: 2019-01-24 | Discharge: 2019-01-24 | Disposition: A | Discharge status: Home / Self Care | Payer: Self-pay | Attending: Emergency Medicine | Admitting: Emergency Medicine

## 2019-01-24 ENCOUNTER — Other Ambulatory Visit: Payer: Self-pay

## 2019-01-24 ENCOUNTER — Emergency Department (HOSPITAL_COMMUNITY): Admission: EM | Admit: 2019-01-24 | Discharge: 2019-01-24 | Discharge status: Absent without leave | Payer: Self-pay

## 2019-01-24 ENCOUNTER — Encounter (HOSPITAL_COMMUNITY): Payer: Self-pay

## 2019-01-24 DIAGNOSIS — R079 Chest pain, unspecified: Secondary | ICD-10-CM | POA: Insufficient documentation

## 2019-01-24 DIAGNOSIS — Z5321 Procedure and treatment not carried out due to patient leaving prior to being seen by health care provider: Secondary | ICD-10-CM | POA: Insufficient documentation

## 2019-01-24 LAB — BASIC METABOLIC PANEL
Anion gap: 10 (ref 5–15)
BUN: 12 mg/dL (ref 6–20)
CO2: 24 mmol/L (ref 22–32)
Calcium: 8.9 mg/dL (ref 8.9–10.3)
Chloride: 105 mmol/L (ref 98–111)
Creatinine, Ser: 0.86 mg/dL (ref 0.44–1.00)
GFR calc Af Amer: >60 mL/min (ref >60)
GFR calc non Af Amer: >60 mL/min (ref >60)
Glucose, Bld: 114 mg/dL — ABNORMAL HIGH (ref 70–99)
Potassium: 3.6 mmol/L (ref 3.5–5.1)
Sodium: 139 mmol/L (ref 135–145)

## 2019-01-24 LAB — I-STAT BETA HCG BLOOD, ED (MC, WL, AP ONLY): I-stat hCG, quantitative: <5 m[IU]/mL (ref <5)

## 2019-01-24 LAB — CBC
HCT: 37.6 % (ref 36.0–46.0)
Hemoglobin: 13.2 g/dL (ref 12.0–15.0)
MCH: 32 pg (ref 26.0–34.0)
MCHC: 35.1 g/dL (ref 30.0–36.0)
MCV: 91 fL (ref 80.0–100.0)
Platelets: 329 10*3/uL (ref 150–400)
RBC: 4.13 MIL/uL (ref 3.87–5.11)
RDW: 13.4 % (ref 11.5–15.5)
WBC: 7.4 10*3/uL (ref 4.0–10.5)
nRBC: 0 % (ref 0.0–0.2)

## 2019-01-24 LAB — D-DIMER, QUANTITATIVE: D-Dimer, Quant: 0.36 ug/mL-FEU (ref 0.00–0.50)

## 2019-01-24 LAB — TROPONIN I (HIGH SENSITIVITY): Troponin I (High Sensitivity): <2 ng/L (ref <18)

## 2019-01-24 MED ORDER — SODIUM CHLORIDE 0.9% FLUSH: 3.0000 mL | Freq: Once | INTRAVENOUS | Status: DC

## 2019-01-24 NOTE — ED Notes (Signed)
Pt wants to return to ED for further eval; pt was seen and per PL pt either to return to ED or to go home as no other  Tests could be offered at [facility]; pt verbalized understanding and ambulated out of [facility]

## 2019-01-24 NOTE — ED Triage Notes (Signed)
Pt present abdominal and chest pain. Symptoms started 3 days ago. Pt states she is having some SOB.

## 2019-01-24 NOTE — ED Notes (Signed)
Pt was visibly upset at the news that she was to be seen and processed again from the start. Pt claimed that she would prefer to drive to [place] instead as she knows she will be seen Immediately. Charge Nurse notified.

## 2019-01-24 NOTE — ED Triage Notes (Signed)
PER Pt on July 11 was dx with Covid and is still having SOB and is having to use her inhaler more than normal. Pt is also saying in the upper right lung area is hurting more. Pt has no fevers.

## 2019-01-24 NOTE — ED Notes (Addendum)
Pt was at [facility] and walked to [facility] to see if she could be seen there.  Unable to undischarge pt because she was taken out and then placed in Epic again.

## 2019-01-25 ENCOUNTER — Encounter (HOSPITAL_COMMUNITY): Payer: Self-pay | Admitting: Emergency Medicine

## 2019-01-25 ENCOUNTER — Other Ambulatory Visit: Payer: Self-pay

## 2019-01-25 ENCOUNTER — Emergency Department (HOSPITAL_COMMUNITY)
Admission: EM | Admit: 2019-01-25 | Discharge: 2019-01-25 | Discharge status: Against Medical Advice | Payer: Self-pay | Attending: Emergency Medicine | Admitting: Emergency Medicine

## 2019-01-25 ENCOUNTER — Emergency Department (HOSPITAL_COMMUNITY): Payer: Self-pay

## 2019-01-25 DIAGNOSIS — R0781 Pleurodynia: Secondary | ICD-10-CM

## 2019-01-25 DIAGNOSIS — Z87891 Personal history of nicotine dependence: Secondary | ICD-10-CM | POA: Insufficient documentation

## 2019-01-25 DIAGNOSIS — Z8616 Personal history of COVID-19: Secondary | ICD-10-CM

## 2019-01-25 DIAGNOSIS — J45909 Unspecified asthma, uncomplicated: Secondary | ICD-10-CM | POA: Insufficient documentation

## 2019-01-25 DIAGNOSIS — R0789 Other chest pain: Secondary | ICD-10-CM | POA: Insufficient documentation

## 2019-01-25 DIAGNOSIS — U071 COVID-19: Secondary | ICD-10-CM | POA: Insufficient documentation

## 2019-01-25 DIAGNOSIS — Z8619 Personal history of other infectious and parasitic diseases: Secondary | ICD-10-CM

## 2019-01-25 LAB — HEPATIC FUNCTION PANEL
ALT: 29 U/L (ref 0–44)
AST: 20 U/L (ref 15–41)
Albumin: 4.1 g/dL (ref 3.5–5.0)
Alkaline Phosphatase: 75 U/L (ref 38–126)
Bilirubin, Direct: 0.1 mg/dL (ref 0.0–0.2)
Indirect Bilirubin: 0.5 mg/dL (ref 0.3–0.9)
Total Bilirubin: 0.6 mg/dL (ref 0.3–1.2)
Total Protein: 7.5 g/dL (ref 6.5–8.1)

## 2019-01-25 MED ORDER — PROMETHAZINE HCL 25 MG PO TABS
25.0000 mg | ORAL_TABLET | Freq: Once | ORAL | Status: AC
  Administered 2019-01-25: 25 mg via ORAL
  Filled 2019-01-25: qty 1

## 2019-01-25 MED ORDER — FAMOTIDINE 20 MG PO TABS
20.0000 mg | ORAL_TABLET | Freq: Once | ORAL | Status: AC
  Administered 2019-01-25: 04:00:00 20 mg via ORAL
  Filled 2019-01-25: qty 1

## 2019-01-25 NOTE — ED Provider Notes (Signed)
[facility] DEPT Provider Note   CSN: [id number] Arrival date & time: 01/25/19  0013    History   Chief Complaint Chief Complaint  Patient presents with  . Shortness of Breath    HPI Katrina [name] is a 28 y.o. female with a history of opioid dependence, asthma, and migraines who presents to the emergency department with a chief complaint of shortness of breath.  The patient reports that she was diagnosed with COVID-19 on July 11.  She reports that initially she was having severe shortness of breath, but this subsided until 3 days ago.  She reports associated pleuritic chest pain.  She reports that she has been having progressively worsening shortness of breath over the last 3 days.  Reports that she has been using her home albuterol inhaler almost every hour.  She denies cough, fever, chills, nausea, vomiting, or diarrhea.  She also reports that she has been having some upper abdominal pain.  No urinary symptoms, vaginal discharge, back pain.   Patient initially went to [facility] then left and checked under urgent care, but left before seeing seen then left before being seen at [facility] and came to [facility] long ER.     The history is provided by the patient. No language interpreter was used.    Past Medical History:  Diagnosis Date  . Asthma   . Headache in pregnancy   . Herpes simplex of female genitalia    last outbreak 4 months ago  . Migraine   . Migraines     Patient Active Problem List   Diagnosis Date Noted  . Opiate dependence ([place]) 03/26/2017  . No prenatal care in current pregnancy in third trimester 03/25/2017  . Right knee pain 09/27/2015  . Biliary colic 86/57/8469  . Recurrent biliary colic 62/95/2841  . Cholestasis of pregnancy in third trimester 01/03/2015  . [redacted] weeks gestation of pregnancy   . Gestational hypertension   . Pregnancy 12/19/2014  . Migraine without aura and without status migrainosus, not  intractable 08/21/2014  . Pregnancy complication 32/44/0102  . Low-lying placenta   . Preterm premature rupture of membranes with onset of labor more than 24 hours following rupture in second trimester   . Vaginal bleeding in pregnancy   . [redacted] weeks gestation of pregnancy   . Amniotic fluid leaking   . Pregnant 05/26/2014  . Smoker 05/26/2014  . Breast lump on right side at 10 o'clock position 03/21/2014  . 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 CESAREAN SECTION N/A 03/27/2017   Procedure: CESAREAN SECTION;  Surgeon: Jerelyn Charles, MD;  Location: [place];  Service: Obstetrics;  Laterality: N/A;  . CHOLECYSTECTOMY N/A 01/17/2015   Procedure: LAPAROSCOPIC CHOLECYSTECTOMY WITH INTRAOPERATIVE CHOLANGIOGRAM;  Surgeon: Excell Seltzer, MD;  Location: [facility];  Service: General;  Laterality: N/A;  . COLPOSCOPY  03/2014     OB History    Gravida  4   Para  4   Term  3   Preterm  1   AB  0   Living  4     SAB  0   TAB  0   Ectopic  0   Multiple  0   Live Births  4            Home Medications    Prior  to Admission medications   Medication Sig Start Date End Date Taking? Authorizing Provider  albuterol (PROVENTIL HFA;VENTOLIN HFA) 108 (90 BASE) MCG/ACT inhaler Inhale 2 puffs into the lungs every 6 (six) hours as needed for wheezing or shortness of breath.   Yes [provider]  nitrofurantoin, macrocrystal-monohydrate, (MACROBID) 100 MG capsule Take 1 capsule (100 mg total) by mouth 2 (two) times daily. Patient not taking: Reported on 01/25/2019 01/08/19   Petrucelli, Samantha R, PA-C  ondansetron (ZOFRAN ODT) 4 MG disintegrating tablet Take 1 tablet (4 mg total) by mouth every 8 (eight) hours as needed for nausea or vomiting. Patient not taking: Reported on 01/25/2019 01/08/19    Petrucelli, Aldona Bar R, PA-C  fluticasone (FLONASE) 50 MCG/ACT nasal spray Place 1 spray into both nostrils daily. 01/08/19 01/12/19  Petrucelli, Glynda Jaeger, PA-C    Family History Family History  Problem Relation Age of Onset  . Asthma Mother   . Asthma Sister   . Asthma Sister     Social History Social History   Tobacco Use  . Smoking status: Former Smoker    Packs/day: 0.25    Years: 2.00    Pack years: 0.50    Types: Cigarettes    Quit date: 05/02/2013    Years since quitting: 5.7  . Smokeless tobacco: Never Used  Substance Use Topics  . Alcohol use: No    Alcohol/week: 0.0 standard drinks    Comment: socially  . Drug use: Yes    Frequency: 21.0 times per week    Types: Other-see comments    Comment: percocet; 3 per day per pt.      Allergies   Shellfish allergy, Other, and Penicillins   Review of Systems Review of Systems  Constitutional: Negative for activity change, chills and fever.  HENT: Negative for congestion and sore throat.   Eyes: Negative for visual disturbance.  Respiratory: Negative for shortness of breath and wheezing.   Cardiovascular: Positive for chest pain. Negative for palpitations and leg swelling.  Gastrointestinal: Positive for abdominal pain. Negative for diarrhea, nausea and vomiting.  Genitourinary: Negative for dysuria.  Musculoskeletal: Negative for back pain.  Skin: Negative for rash.  Allergic/Immunologic: Negative for immunocompromised state.  Neurological: Negative for speech difficulty, numbness and headaches.  Psychiatric/Behavioral: Negative for confusion.     Physical Exam Updated Vital Signs BP 96/86   Pulse 71   Temp 98.5 F (36.9 C) (Oral)   Resp 16   Ht 5\' 1"  (1.549 m)   Wt 77.1 kg   LMP 01/08/2019   SpO2 99%   BMI 32.12 kg/m   Physical Exam Vitals signs and nursing note reviewed.  Constitutional:      General: She is not in acute distress.    Appearance: She is not ill-appearing, toxic-appearing or  diaphoretic.  HENT:     Head: Normocephalic.  Eyes:     Conjunctiva/sclera: Conjunctivae normal.  Neck:     Musculoskeletal: Neck supple.  Cardiovascular:     Rate and Rhythm: Normal rate and regular rhythm.     Heart sounds: No murmur. No friction rub. No gallop.   Pulmonary:     Effort: Pulmonary effort is normal. No respiratory distress.     Breath sounds: No stridor. No wheezing, rhonchi or rales.  Chest:     Chest wall: No tenderness.  Abdominal:     General: There is no distension.     Palpations: Abdomen is soft. There is no mass.     Tenderness: There is  no abdominal tenderness. There is no right CVA tenderness, left CVA tenderness, guarding or rebound.     Hernia: No hernia is present.  Musculoskeletal:     Right lower leg: No edema.     Left lower leg: No edema.  Skin:    General: Skin is warm.     Findings: No rash.  Neurological:     Mental Status: She is alert.  Psychiatric:        Behavior: Behavior normal.      ED Treatments / Results  Labs (all labs ordered are listed, but only abnormal results are displayed) Labs Reviewed  HEPATIC FUNCTION PANEL    EKG None  Radiology Dg Chest Portable 1 View  Result Date: 01/25/2019 CLINICAL DATA:  COVID-19 positive, short of breath EXAM: PORTABLE CHEST 1 VIEW COMPARISON:  01/08/2019 FINDINGS: The heart size and mediastinal contours are within normal limits. Both lungs are clear. The visualized skeletal structures are unremarkable. IMPRESSION: No active disease. Electronically Signed   By: Donavan Foil M.D.   On: 01/25/2019 03:59    Procedures Procedures (including critical care time)  Medications Ordered in ED Medications  famotidine (PEPCID) tablet 20 mg (20 mg Oral Given 01/25/19 0358)  promethazine (PHENERGAN) tablet 25 mg (25 mg Oral Given 01/25/19 0358)     Initial Impression / Assessment and Plan / ED Course  I have reviewed the triage vital signs and the nursing notes.  Pertinent labs & imaging  results that were available during my care of the patient were reviewed by me and considered in my medical decision making (see chart for details).        28 year old female with a history of opioid dependence, asthma, and migraines who presents to the emergency department with shortness of breath, pleuritic chest pain, and abdominal pain over the last 3 days.  The patient was diagnosed with COVID-19 on July 11 and initially had severe symptoms, but reports that these resolved until 3 days ago.  No constitutional symptoms.  Labs reviewed from earlier tonight that were ordered at [facility].  They are reassuring.  D-dimer is negative so low suspicion for PE.  She has had biliary colic in the past and a hepatic function panel was not ordered.  These have been ordered and are unremarkable.  Chest x-ray is unremarkable.  She has had no fever, hypoxia, tachypnea, or tachycardia since arrival in the ER.  After chest x-ray and hepatic function panel resulted, I returned to the patient's room to inform her of these results, but she had eloped from the department.  I suspect that she may have been having pleurisy secondary to recent COVID-19 infection, but I was unable to discuss this or her results with the patient given her departure.  Final Clinical Impressions(s) / ED Diagnoses   Final diagnoses:  History of 2019 novel coronavirus disease (COVID-19)  Pleuritic chest pain    ED Discharge Orders    None       Tasmia Blumer A, PA-C 01/25/19 0859    Rolland Porter, MD 01/29/19 [phone]

## 2019-01-25 NOTE — ED Triage Notes (Signed)
Pt reports having increasing shortness of breath since being dx with COVID-19 on 01/06/2019 pt also reporting abd pain.

## 2019-01-26 ENCOUNTER — Ambulatory Visit: Payer: Self-pay | Attending: Family Medicine | Admitting: Family Medicine

## 2019-01-29 ENCOUNTER — Other Ambulatory Visit: Payer: Self-pay

## 2019-01-29 ENCOUNTER — Encounter (HOSPITAL_COMMUNITY): Payer: Self-pay

## 2019-01-29 ENCOUNTER — Emergency Department (HOSPITAL_COMMUNITY): Payer: Self-pay

## 2019-01-29 ENCOUNTER — Emergency Department (HOSPITAL_COMMUNITY)
Admission: EM | Admit: 2019-01-29 | Discharge: 2019-01-29 | Disposition: A | Discharge status: Home / Self Care | Payer: Self-pay | Attending: Emergency Medicine | Admitting: Emergency Medicine

## 2019-01-29 DIAGNOSIS — J45909 Unspecified asthma, uncomplicated: Secondary | ICD-10-CM | POA: Insufficient documentation

## 2019-01-29 DIAGNOSIS — R002 Palpitations: Secondary | ICD-10-CM | POA: Insufficient documentation

## 2019-01-29 DIAGNOSIS — R5381 Other malaise: Secondary | ICD-10-CM | POA: Insufficient documentation

## 2019-01-29 DIAGNOSIS — Z87891 Personal history of nicotine dependence: Secondary | ICD-10-CM | POA: Insufficient documentation

## 2019-01-29 LAB — I-STAT BETA HCG BLOOD, ED (MC, WL, AP ONLY): I-stat hCG, quantitative: <5 m[IU]/mL (ref <5)

## 2019-01-29 LAB — COMPREHENSIVE METABOLIC PANEL
ALT: 67 U/L — ABNORMAL HIGH (ref 0–44)
AST: 30 U/L (ref 15–41)
Albumin: 4.2 g/dL (ref 3.5–5.0)
Alkaline Phosphatase: 69 U/L (ref 38–126)
Anion gap: 10 (ref 5–15)
BUN: 8 mg/dL (ref 6–20)
CO2: 22 mmol/L (ref 22–32)
Calcium: 9.2 mg/dL (ref 8.9–10.3)
Chloride: 104 mmol/L (ref 98–111)
Creatinine, Ser: 0.69 mg/dL (ref 0.44–1.00)
GFR calc Af Amer: >60 mL/min (ref >60)
GFR calc non Af Amer: >60 mL/min (ref >60)
Glucose, Bld: 104 mg/dL — ABNORMAL HIGH (ref 70–99)
Potassium: 3.6 mmol/L (ref 3.5–5.1)
Sodium: 136 mmol/L (ref 135–145)
Total Bilirubin: 0.6 mg/dL (ref 0.3–1.2)
Total Protein: 7.6 g/dL (ref 6.5–8.1)

## 2019-01-29 LAB — CBC
HCT: 38.7 % (ref 36.0–46.0)
Hemoglobin: 13.7 g/dL (ref 12.0–15.0)
MCH: 31.8 pg (ref 26.0–34.0)
MCHC: 35.4 g/dL (ref 30.0–36.0)
MCV: 89.8 fL (ref 80.0–100.0)
Platelets: 281 10*3/uL (ref 150–400)
RBC: 4.31 MIL/uL (ref 3.87–5.11)
RDW: 13.2 % (ref 11.5–15.5)
WBC: 11.5 10*3/uL — ABNORMAL HIGH (ref 4.0–10.5)
nRBC: 0 % (ref 0.0–0.2)

## 2019-01-29 LAB — LIPASE, BLOOD: Lipase: 21 U/L (ref 11–51)

## 2019-01-29 MED ORDER — SODIUM CHLORIDE 0.9% FLUSH: 3.0000 mL | Freq: Once | INTRAVENOUS | Status: DC

## 2019-01-29 NOTE — ED Notes (Signed)
Pt given dc instructions and pt verbalizes understanding. Pt ambulatory.

## 2019-01-29 NOTE — Discharge Instructions (Signed)
It was our pleasure to provide your ER care today - we hope that you feel better.  Your lab work appears good. Your chest xray is normal.  Your blood pressure is also good/normal, and your heart rhythm is normal.  It is possible your symptoms relate to continued recovery from your recent covid infection.  Follow up with primary care doctor in the next 1-2 weeks.  Return to ER if worse, new symptoms, high fevers, trouble breathing, severe pain, or other concern.

## 2019-01-29 NOTE — ED Provider Notes (Signed)
[facility] EMERGENCY DEPARTMENT Provider Note   CSN: [id number] Arrival date & time: 01/29/19  1027     History   Chief Complaint Chief Complaint  Patient presents with  . HTN/ abd. pain    HPI Katrina [name] is a 28 y.o. female.     Patient c/o concern about bp being high, also states intermittent palpitations in the past few weeks, and states has felt intermittently sob and nauseated since covid diagnosis 01/06/19. States her earlier covid symptoms, including fever and cough have resolved. With palpitations, denies syncope. States occasional has chest discomfort that lasts 2-3 seconds.  Denies headache. No neck pain or stiffness. No exertional cp or discomfort. No pleuritic pain. Denies abd pain or nvd. No dysuria or gu c/o. No fevers.   The history is provided by the patient.    Past Medical History:  Diagnosis Date  . Asthma   . Headache in pregnancy   . Herpes simplex of female genitalia    last outbreak 4 months ago  . Migraine   . Migraines     Patient Active Problem List   Diagnosis Date Noted  . Opiate dependence ([place]) 03/26/2017  . No prenatal care in current pregnancy in third trimester 03/25/2017  . Right knee pain 09/27/2015  . Biliary colic 76/28/3151  . Recurrent biliary colic 76/16/0737  . Cholestasis of pregnancy in third trimester 01/03/2015  . [redacted] weeks gestation of pregnancy   . Gestational hypertension   . Pregnancy 12/19/2014  . Migraine without aura and without status migrainosus, not intractable 08/21/2014  . Pregnancy complication 10/62/6948  . Low-lying placenta   . Preterm premature rupture of membranes with onset of labor more than 24 hours following rupture in second trimester   . Vaginal bleeding in pregnancy   . [redacted] weeks gestation of pregnancy   . Amniotic fluid leaking   . Pregnant 05/26/2014  . Smoker 05/26/2014  . Breast lump on right side at 10 o'clock position 03/21/2014  . Atypical squamous cell changes of  undetermined significance (ASCUS) on cervical cytology with positive high risk human papilloma virus (HPV) 03/21/2014    Past Surgical History:  Procedure Laterality Date  . CESAREAN SECTION N/A 03/27/2017   Procedure: CESAREAN SECTION;  Surgeon: Jerelyn Charles, MD;  Location: [place];  Service: Obstetrics;  Laterality: N/A;  . CHOLECYSTECTOMY N/A 01/17/2015   Procedure: LAPAROSCOPIC CHOLECYSTECTOMY WITH INTRAOPERATIVE CHOLANGIOGRAM;  Surgeon: Excell Seltzer, MD;  Location: [facility];  Service: General;  Laterality: N/A;  . COLPOSCOPY  03/2014     OB History    Gravida  4   Para  4   Term  3   Preterm  1   AB  0   Living  4     SAB  0   TAB  0   Ectopic  0   Multiple  0   Live Births  4            Home Medications    Prior to Admission medications   Medication Sig Start Date End Date Taking? Authorizing Provider  albuterol (PROVENTIL HFA;VENTOLIN HFA) 108 (90 BASE) MCG/ACT inhaler Inhale 2 puffs into the lungs every 6 (six) hours as needed for wheezing or shortness of breath.    [provider]  nitrofurantoin, macrocrystal-monohydrate, (MACROBID) 100 MG capsule Take 1 capsule (100 mg total) by mouth 2 (two) times daily. Patient not taking: Reported on 01/25/2019 01/08/19   Petrucelli, Aldona Bar R, PA-C  ondansetron (  ZOFRAN ODT) 4 MG disintegrating tablet Take 1 tablet (4 mg total) by mouth every 8 (eight) hours as needed for nausea or vomiting. Patient not taking: Reported on 01/25/2019 01/08/19   Petrucelli, Aldona Bar R, PA-C  fluticasone (FLONASE) 50 MCG/ACT nasal spray Place 1 spray into both nostrils daily. 01/08/19 01/12/19  Petrucelli, Glynda Jaeger, PA-C    Family History Family History  Problem Relation Age of Onset  . Asthma Mother   . Asthma Sister   . Asthma Sister     Social History Social History   Tobacco Use  . Smoking status: Former Smoker    Packs/day: 0.25    Years: 2.00    Pack years: 0.50    Types: Cigarettes    Quit  date: 05/02/2013    Years since quitting: 5.7  . Smokeless tobacco: Never Used  Substance Use Topics  . Alcohol use: No    Alcohol/week: 0.0 standard drinks    Comment: socially  . Drug use: Yes    Frequency: 21.0 times per week    Types: Other-see comments    Comment: percocet; 3 per day per pt.      Allergies   Shellfish allergy, Other, and Penicillins   Review of Systems Review of Systems  Constitutional: Negative for chills and fever.  HENT: Negative for sore throat.   Eyes: Negative for visual disturbance.  Respiratory: Negative for cough.   Cardiovascular: Positive for palpitations. Negative for leg swelling.  Gastrointestinal: Positive for nausea. Negative for abdominal pain and vomiting.  Endocrine: Negative for polyuria.  Genitourinary: Negative for dysuria and flank pain.  Musculoskeletal: Negative for back pain and neck pain.  Skin: Negative for rash.  Neurological: Negative for headaches.  Hematological: Does not bruise/bleed easily.  Psychiatric/Behavioral: Negative for confusion.     Physical Exam Updated Vital Signs BP (!) 140/92 (BP Location: Left Arm)   Pulse (!) 103   Temp 98.7 F (37.1 C) (Oral)   Resp 18   LMP 01/08/2019   SpO2 99%   Physical Exam Vitals signs and nursing note reviewed.  Constitutional:      Appearance: Normal appearance. She is well-developed.  HENT:     Head: Atraumatic.     Nose: Nose normal.     Mouth/Throat:     Mouth: Mucous membranes are moist.  Eyes:     General: No scleral icterus.    Conjunctiva/sclera: Conjunctivae normal.     Pupils: Pupils are equal, round, and reactive to light.  Neck:     Musculoskeletal: Normal range of motion and neck supple. No neck rigidity or muscular tenderness.     Vascular: No carotid bruit.     Trachea: No tracheal deviation.  Cardiovascular:     Rate and Rhythm: Normal rate and regular rhythm.     Pulses: Normal pulses.     Heart sounds: Normal heart sounds. No murmur. No  friction rub. No gallop.   Pulmonary:     Effort: Pulmonary effort is normal. No respiratory distress.     Breath sounds: Normal breath sounds.  Abdominal:     General: Bowel sounds are normal. There is no distension.     Palpations: Abdomen is soft.     Tenderness: There is no abdominal tenderness. There is no guarding.  Genitourinary:    Comments: No cva tenderness.  Musculoskeletal:        General: No swelling or tenderness.     Right lower leg: No edema.     Left lower leg:  No edema.  Skin:    General: Skin is warm and dry.     Findings: No rash.  Neurological:     Mental Status: She is alert.     Comments: Alert, speech normal. Steady gait.   Psychiatric:        Mood and Affect: Mood normal.      ED Treatments / Results  Labs (all labs ordered are listed, but only abnormal results are displayed) Results for orders placed or performed during the hospital encounter of 01/29/19  Lipase, blood  Result Value Ref Range   Lipase 21 11 - 51 U/L  Comprehensive metabolic panel  Result Value Ref Range   Sodium 136 135 - 145 mmol/L   Potassium 3.6 3.5 - 5.1 mmol/L   Chloride 104 98 - 111 mmol/L   CO2 22 22 - 32 mmol/L   Glucose, Bld 104 (H) 70 - 99 mg/dL   BUN 8 6 - 20 mg/dL   Creatinine, Ser 0.69 0.44 - 1.00 mg/dL   Calcium 9.2 8.9 - 10.3 mg/dL   Total Protein 7.6 6.5 - 8.1 g/dL   Albumin 4.2 3.5 - 5.0 g/dL   AST 30 15 - 41 U/L   ALT 67 (H) 0 - 44 U/L   Alkaline Phosphatase 69 38 - 126 U/L   Total Bilirubin 0.6 0.3 - 1.2 mg/dL   GFR calc non Af Amer >60 >60 mL/min   GFR calc Af Amer >60 >60 mL/min   Anion gap 10 5 - 15  CBC  Result Value Ref Range   WBC 11.5 (H) 4.0 - 10.5 K/uL   RBC 4.31 3.87 - 5.11 MIL/uL   Hemoglobin 13.7 12.0 - 15.0 g/dL   HCT 38.7 36.0 - 46.0 %   MCV 89.8 80.0 - 100.0 fL   MCH 31.8 26.0 - 34.0 pg   MCHC 35.4 30.0 - 36.0 g/dL   RDW 13.2 11.5 - 15.5 %   Platelets 281 150 - 400 K/uL   nRBC 0.0 0.0 - 0.2 %  I-Stat beta hCG blood, ED  Result  Value Ref Range   I-stat hCG, quantitative <5.0 <5 mIU/mL   Comment 3           Dg Chest Portable 1 View  Result Date: 01/25/2019 CLINICAL DATA:  COVID-19 positive, short of breath EXAM: PORTABLE CHEST 1 VIEW COMPARISON:  01/08/2019 FINDINGS: The heart size and mediastinal contours are within normal limits. Both lungs are clear. The visualized skeletal structures are unremarkable. IMPRESSION: No active disease. Electronically Signed   By: Donavan Foil M.D.   On: 01/25/2019 03:59   Dg Chest Portable 1 View  Result Date: 01/08/2019 CLINICAL DATA:  Chest pain and shortness of breath for a couple of days. EXAM: PORTABLE CHEST 1 VIEW COMPARISON:  CT chest 01/31/2015.  PA and lateral chest 05/28/2013. FINDINGS: Lungs clear. Heart size normal. No pneumothorax or pleural fluid. No bony abnormality. IMPRESSION: Normal chest. Electronically Signed   By: Inge Rise M.D.   On: 01/08/2019 13:07    EKG EKG Interpretation  Date/Time:  Saturday January 29 2019 12:34:15 EDT Ventricular Rate:  78 PR Interval:    QRS Duration: 91 QT Interval:  392 QTC Calculation: 447 R Axis:   49 Text Interpretation:  Sinus rhythm Normal ECG No significant change since last tracing Confirmed by Lajean Saver [phone]) on 01/29/2019 1:42:30 PM   Radiology Dg Chest Port 1 View  Result Date: 01/29/2019 CLINICAL DATA:  Shortness of breath and chest  pain. History of asthma. EXAM: PORTABLE CHEST 1 VIEW COMPARISON:  01/25/2019 FINDINGS: The cardiac silhouette, mediastinal and hilar contours are normal. The lungs are clear. No pleural effusions. The bony thorax is intact. IMPRESSION: No acute cardiopulmonary findings. Electronically Signed   By: Marijo Sanes M.D.   On: 01/29/2019 13:15    Procedures Procedures (including critical care time)  Medications Ordered in ED Medications  sodium chloride flush (NS) 0.9 % injection 3 mL (3 mLs Intravenous Not Given 01/29/19 1200)     Initial Impression / Assessment and Plan /  ED Course  I have reviewed the triage vital signs and the nursing notes.  Pertinent labs & imaging results that were available during my care of the patient were reviewed by me and considered in my medical decision making (see chart for details).  Iv ns. Labs. Cxr. Ecg.  Cardiac monitor.   Reviewed nursing notes and prior charts for additional history. Other recent ED for similar symptoms - on prior visit, ddimer normal, cxr neg acute.   Labs reviewed today by me - hgb normal, chem normal.  CXR reviewed by me - no pna.  Recheck pt - nsr, hr 70's, pulse ox 100%, no increased wob.  Patient appears stable for d/c.   Rec pcp f/u.    Final Clinical Impressions(s) / ED Diagnoses   Final diagnoses:  None    ED Discharge Orders    None       Lajean Saver, MD 01/29/19 1344

## 2019-01-29 NOTE — ED Triage Notes (Signed)
Patient complains of checking her BP the past few days and finding it high. On arrival alert and oriented, complains of epigastric pain with nausea. Also complains of fatigue and general malaise. States LMP 2 weeks ago

## 2019-01-29 NOTE — ED Notes (Signed)
Pt stated she didn't have to urinate as of yet left urine cup at beside.

## 2019-05-27 ENCOUNTER — Other Ambulatory Visit: Payer: Self-pay

## 2019-05-27 ENCOUNTER — Inpatient Hospital Stay (HOSPITAL_COMMUNITY)
Admission: AD | Admit: 2019-05-27 | Discharge: 2019-05-27 | Disposition: A | Discharge status: Home / Self Care | Payer: Self-pay | Attending: Obstetrics and Gynecology | Admitting: Obstetrics and Gynecology

## 2019-05-27 ENCOUNTER — Ambulatory Visit (HOSPITAL_COMMUNITY)
Admission: EM | Admit: 2019-05-27 | Discharge: 2019-05-27 | Disposition: A | Discharge status: Home / Self Care | Payer: Self-pay | Attending: Family Medicine | Admitting: Family Medicine

## 2019-05-27 ENCOUNTER — Encounter (HOSPITAL_COMMUNITY): Payer: Self-pay

## 2019-05-27 DIAGNOSIS — Z825 Family history of asthma and other chronic lower respiratory diseases: Secondary | ICD-10-CM | POA: Insufficient documentation

## 2019-05-27 DIAGNOSIS — Z7952 Long term (current) use of systemic steroids: Secondary | ICD-10-CM | POA: Insufficient documentation

## 2019-05-27 DIAGNOSIS — Z9049 Acquired absence of other specified parts of digestive tract: Secondary | ICD-10-CM | POA: Insufficient documentation

## 2019-05-27 DIAGNOSIS — N898 Other specified noninflammatory disorders of vagina: Secondary | ICD-10-CM | POA: Insufficient documentation

## 2019-05-27 DIAGNOSIS — J45909 Unspecified asthma, uncomplicated: Secondary | ICD-10-CM | POA: Insufficient documentation

## 2019-05-27 DIAGNOSIS — Z9109 Other allergy status, other than to drugs and biological substances: Secondary | ICD-10-CM | POA: Insufficient documentation

## 2019-05-27 DIAGNOSIS — Z202 Contact with and (suspected) exposure to infections with a predominantly sexual mode of transmission: Secondary | ICD-10-CM | POA: Insufficient documentation

## 2019-05-27 DIAGNOSIS — Z880 Allergy status to penicillin: Secondary | ICD-10-CM | POA: Insufficient documentation

## 2019-05-27 DIAGNOSIS — N760 Acute vaginitis: Secondary | ICD-10-CM | POA: Insufficient documentation

## 2019-05-27 DIAGNOSIS — Z91013 Allergy to seafood: Secondary | ICD-10-CM | POA: Insufficient documentation

## 2019-05-27 DIAGNOSIS — Z87891 Personal history of nicotine dependence: Secondary | ICD-10-CM | POA: Insufficient documentation

## 2019-05-27 LAB — POCT URINALYSIS DIP (DEVICE)
Bilirubin Urine: NEGATIVE
Glucose, UA: NEGATIVE mg/dL
Ketones, ur: NEGATIVE mg/dL
Leukocytes,Ua: NEGATIVE
Nitrite: NEGATIVE
Protein, ur: NEGATIVE mg/dL
Specific Gravity, Urine: >=1.03 (ref 1.005–1.030)
Urobilinogen, UA: 0.2 mg/dL (ref 0.0–1.0)
pH: 5.5 (ref 5.0–8.0)

## 2019-05-27 LAB — POCT PREGNANCY, URINE: Preg Test, Ur: NEGATIVE

## 2019-05-27 LAB — HIV ANTIBODY (ROUTINE TESTING W REFLEX): HIV Screen 4th Generation wRfx: NONREACTIVE

## 2019-05-27 MED ORDER — AZITHROMYCIN 250 MG PO TABS
ORAL_TABLET | ORAL | Status: AC
  Filled 2019-05-27: qty 4

## 2019-05-27 MED ORDER — CEFTRIAXONE SODIUM 250 MG IJ SOLR
250.0000 mg | Freq: Once | INTRAMUSCULAR | Status: AC
  Administered 2019-05-27: 19:00:00 250 mg via INTRAMUSCULAR

## 2019-05-27 MED ORDER — CEFTRIAXONE SODIUM 250 MG IJ SOLR
INTRAMUSCULAR | Status: AC
  Filled 2019-05-27: qty 250

## 2019-05-27 MED ORDER — AZITHROMYCIN 250 MG PO TABS
1000.0000 mg | ORAL_TABLET | Freq: Once | ORAL | Status: AC
  Administered 2019-05-27: 1000 mg via ORAL

## 2019-05-27 NOTE — MAU Note (Signed)
After triage, pt said she was going to her car briefly. I told her we'd see her when she came back. Looked for patient in lobby 3x. Provider aware. Pt not pregnant.

## 2019-05-27 NOTE — ED Triage Notes (Addendum)
Pt states she is having white vaginal discharged, vaginal itchy, nausea, burning sensation when urinating x 5 days. Pt states having painful sores in her genitalia x 5 days. Pt reports lower abdominal, [facility].  pain x 5 days   Pt reports her boyfriend is having penile discharge x 15 days aprox.

## 2019-05-27 NOTE — ED Provider Notes (Signed)
[place]    CSN: [id number] Arrival date & time: 05/27/19  1801      History   Chief Complaint Chief Complaint  Patient presents with  . Exposure to STD  . Vaginal Discharge  . Vaginal Itching  . Mouth Lesions  . Dysuria  . Abdominal Pain    HPI COSMA POLLINO is a 28 y.o. female.   Lujean Amel presents with complaints of vaginal discharge, burning and swelling. Occasional nausea and decreased appetite. Noted symptoms for the past 5 days. Sexually active with her boyfriend who she recently found has another partner and has not been using condoms. She states she has seen concerning things such as discharge and lesions to her partners penis. She has had trich, yeast, gonorrhea and herpes in the past. LMP was two months ago. Went to [facility] hospital prior to this encounter with negative pregnancy test. No urinary symptoms.     ROS per HPI, negative if not otherwise mentioned.      Past Medical History:  Diagnosis Date  . Asthma   . Headache in pregnancy   . Herpes simplex of female genitalia    last outbreak 4 months ago  . Migraine   . Migraines     Patient Active Problem List   Diagnosis Date Noted  . Opiate dependence ([place]) 03/26/2017  . No prenatal care in current pregnancy in third trimester 03/25/2017  . Right knee pain 09/27/2015  . Biliary colic Q000111Q  . Recurrent biliary colic Q000111Q  . Cholestasis of pregnancy in third trimester 01/03/2015  . [redacted] weeks gestation of pregnancy   . Gestational hypertension   . Pregnancy 12/19/2014  . Migraine without aura and without status migrainosus, not intractable 08/21/2014  . Pregnancy complication [id number]  . Low-lying placenta   . Preterm premature rupture of membranes with onset of labor more than 24 hours following rupture in second trimester   . Vaginal bleeding in pregnancy   . [redacted] weeks gestation of pregnancy   . Amniotic fluid leaking   . Pregnant 05/26/2014  . Smoker  05/26/2014  . Breast lump on right side at 10 o'clock position 03/21/2014  . 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 CESAREAN SECTION N/A 03/27/2017   Procedure: CESAREAN SECTION;  Surgeon: Jerelyn Charles, MD;  Location: [place];  Service: Obstetrics;  Laterality: N/A;  . CHOLECYSTECTOMY N/A 01/17/2015   Procedure: LAPAROSCOPIC CHOLECYSTECTOMY WITH INTRAOPERATIVE CHOLANGIOGRAM;  Surgeon: Excell Seltzer, MD;  Location: [facility];  Service: General;  Laterality: N/A;  . COLPOSCOPY  03/2014    OB History    Gravida  4   Para  4   Term  3   Preterm  1   AB  0   Living  4     SAB  0   TAB  0   Ectopic  0   Multiple  0   Live Births  4            Home Medications    Prior to Admission medications   Medication Sig Start Date End Date Taking? Authorizing Provider  albuterol (PROVENTIL HFA;VENTOLIN HFA) 108 (90 BASE) MCG/ACT inhaler Inhale 2 puffs into the lungs every 6 (six) hours as needed for wheezing or shortness of breath.    [provider]  nitrofurantoin, macrocrystal-monohydrate, (MACROBID) 100 MG capsule  Take 1 capsule (100 mg total) by mouth 2 (two) times daily. Patient not taking: Reported on 01/25/2019 01/08/19   Petrucelli, Samantha R, PA-C  ondansetron (ZOFRAN ODT) 4 MG disintegrating tablet Take 1 tablet (4 mg total) by mouth every 8 (eight) hours as needed for nausea or vomiting. Patient not taking: Reported on 01/25/2019 01/08/19   Petrucelli, Aldona Bar R, PA-C  fluticasone (FLONASE) 50 MCG/ACT nasal spray Place 1 spray into both nostrils daily. 01/08/19 01/12/19  Petrucelli, Glynda Jaeger, PA-C    Family History Family History  Problem Relation Age of Onset  . Asthma Mother   . Asthma Sister   . Asthma Sister     Social History Social History   Tobacco Use  . Smoking  status: Former Smoker    Packs/day: 0.25    Years: 2.00    Pack years: 0.50    Types: Cigarettes    Quit date: 05/02/2013    Years since quitting: 6.0  . Smokeless tobacco: Never Used  Substance Use Topics  . Alcohol use: No    Alcohol/week: 0.0 standard drinks    Comment: socially  . Drug use: Yes    Frequency: 21.0 times per week    Types: Other-see comments    Comment: percocet; 3 per day per pt.      Allergies   Shellfish allergy, Other, and Penicillins   Review of Systems Review of Systems   Physical Exam Triage Vital Signs ED Triage Vitals  Enc Vitals Group     BP 05/27/19 1816 118/82     Pulse Rate 05/27/19 1816 92     Resp 05/27/19 1816 17     Temp 05/27/19 1816 98.7 F (37.1 C)     Temp Source 05/27/19 1816 Oral     SpO2 05/27/19 1816 97 %     Weight --      Height --      Head Circumference --      Peak Flow --      Pain Score 05/27/19 1814 10     Pain Loc --      Pain Edu? --      Excl. in [place]? --    No data found.  Updated Vital Signs BP 118/82 (BP Location: Left Arm)   Pulse 92   Temp 98.7 F (37.1 C) (Oral)   Resp 17   LMP  (Within Months) Comment: 2 months ago   SpO2 97%   Breastfeeding No   Visual Acuity Right Eye Distance:   Left Eye Distance:   Bilateral Distance:    Right Eye Near:   Left Eye Near:    Bilateral Near:     Physical Exam Constitutional:      General: She is not in acute distress.    Appearance: She is well-developed.  Cardiovascular:     Rate and Rhythm: Normal rate.  Pulmonary:     Effort: Pulmonary effort is normal.  Genitourinary:    General: Normal vulva.     Cervix: No cervical bleeding.     Comments: Mild amount of clear/white creamy vaginal discharge within vault noted  Skin:    General: Skin is warm and dry.  Neurological:     Mental Status: She is alert and oriented to person, place, and time.      UC Treatments / Results  Labs (all labs ordered are listed, but only abnormal results are  displayed) Labs Reviewed  POCT URINALYSIS DIP (DEVICE) - Abnormal; Notable for the following components:  Result Value   Hgb urine dipstick MODERATE (*)    All other components within normal limits  HIV ANTIBODY (ROUTINE TESTING W REFLEX)  RPR  CERVICOVAGINAL ANCILLARY ONLY    EKG   Radiology No results found.  Procedures Procedures (including critical care time)  Medications Ordered in UC Medications  azithromycin (ZITHROMAX) tablet 1,000 mg (1,000 mg Oral Given 05/27/19 1924)  cefTRIAXone (ROCEPHIN) injection 250 mg (250 mg Intramuscular Given 05/27/19 1924)  azithromycin (ZITHROMAX) 250 MG tablet (has no administration in time range)  cefTRIAXone (ROCEPHIN) 250 MG injection (has no administration in time range)    Initial Impression / Assessment and Plan / UC Course  I have reviewed the triage vital signs and the nursing notes.  Pertinent labs & imaging results that were available during my care of the patient were reviewed by me and considered in my medical decision making (see chart for details).     Exam is reassuring. Empiric treatment with rocephin and azithromycin provided here tonight. Vaginal cytology collected and pending. Return precautions provided. Safe sex encouraged. Patient verbalized understanding and agreeable to plan.   Final Clinical Impressions(s) / UC Diagnoses   Final diagnoses:  Vaginal discharge  Acute vaginitis     Discharge Instructions     We have treated you today for gonorrhea and chlamydia.  Will notify of any positive findings and if any changes to treatment are needed.  Please withhold from intercourse for the next week. Please use condoms to prevent STD's.     ED Prescriptions    None     PDMP not reviewed this encounter.   Zigmund Gottron, NP 05/27/19 1932

## 2019-05-27 NOTE — MAU Note (Signed)
Pt states suspects she has a vaginal infection. Symptoms are burning, swelling and discharge. States her boyfriend has discharge, bumps and swelling on his penis. Hasn't used any protection. Pt has had BLT in 2018. Unsure if pregnant, hasn't had period in 2 months.

## 2019-05-27 NOTE — Progress Notes (Signed)
Patient called from lobby, by provider, and is not present/no answer.  Nurse informed that patient not present.    Maryann Conners MSN, CNM Advanced Practice Provider, [facility]

## 2019-05-27 NOTE — Discharge Instructions (Signed)
We have treated you today for gonorrhea and chlamydia.  Will notify of any positive findings and if any changes to treatment are needed.  Please withhold from intercourse for the next week. Please use condoms to prevent STD's.

## 2019-05-28 LAB — RPR: RPR Ser Ql: NONREACTIVE

## 2019-06-02 ENCOUNTER — Telehealth: Payer: Self-pay | Admitting: Emergency Medicine

## 2019-06-02 LAB — CERVICOVAGINAL ANCILLARY ONLY
Candida vaginitis: NEGATIVE
Chlamydia: NEGATIVE
Neisseria Gonorrhea: NEGATIVE
Trichomonas: NEGATIVE

## 2019-06-02 NOTE — Telephone Encounter (Signed)
STD negative. Unable to determine if positive for bacterial vaginosis. If still symptomatic, follow up is recommended.  Attempted to reach patient, phone just rang and rang. Mychart notified.

## 2019-06-30 ENCOUNTER — Other Ambulatory Visit (HOSPITAL_COMMUNITY): Payer: Self-pay | Admitting: *Deleted

## 2019-06-30 DIAGNOSIS — N6320 Unspecified lump in the left breast, unspecified quadrant: Secondary | ICD-10-CM

## 2019-07-21 ENCOUNTER — Ambulatory Visit (HOSPITAL_COMMUNITY): Payer: Self-pay

## 2019-07-21 ENCOUNTER — Other Ambulatory Visit: Payer: Self-pay

## 2019-09-19 ENCOUNTER — Ambulatory Visit (HOSPITAL_COMMUNITY)
Admission: EM | Admit: 2019-09-19 | Discharge: 2019-09-19 | Disposition: A | Discharge status: Home / Self Care | Payer: Self-pay | Attending: Family Medicine | Admitting: Family Medicine

## 2019-09-19 ENCOUNTER — Other Ambulatory Visit: Payer: Self-pay

## 2019-09-19 ENCOUNTER — Encounter (HOSPITAL_COMMUNITY): Payer: Self-pay | Admitting: Family Medicine

## 2019-09-19 ENCOUNTER — Telehealth: Payer: Self-pay | Admitting: *Deleted

## 2019-09-19 ENCOUNTER — Ambulatory Visit: Payer: Self-pay | Admitting: *Deleted

## 2019-09-19 DIAGNOSIS — R112 Nausea with vomiting, unspecified: Secondary | ICD-10-CM | POA: Insufficient documentation

## 2019-09-19 DIAGNOSIS — R197 Diarrhea, unspecified: Secondary | ICD-10-CM | POA: Insufficient documentation

## 2019-09-19 LAB — CBC WITH DIFFERENTIAL/PLATELET
Abs Immature Granulocytes: 0.02 10*3/uL (ref 0.00–0.07)
Basophils Absolute: 0 10*3/uL (ref 0.0–0.1)
Basophils Relative: 0 %
Eosinophils Absolute: 0.8 10*3/uL — ABNORMAL HIGH (ref 0.0–0.5)
Eosinophils Relative: 7 %
HCT: 38.5 % (ref 36.0–46.0)
Hemoglobin: 14 g/dL (ref 12.0–15.0)
Immature Granulocytes: 0 %
Lymphocytes Relative: 13 %
Lymphs Abs: 1.4 10*3/uL (ref 0.7–4.0)
MCH: 31.2 pg (ref 26.0–34.0)
MCHC: 36.4 g/dL — ABNORMAL HIGH (ref 30.0–36.0)
MCV: 85.7 fL (ref 80.0–100.0)
Monocytes Absolute: 0.6 10*3/uL (ref 0.1–1.0)
Monocytes Relative: 5 %
Neutro Abs: 7.9 10*3/uL — ABNORMAL HIGH (ref 1.7–7.7)
Neutrophils Relative %: 75 %
Platelets: 316 10*3/uL (ref 150–400)
RBC: 4.49 MIL/uL (ref 3.87–5.11)
RDW: 12.5 % (ref 11.5–15.5)
WBC: 10.7 10*3/uL — ABNORMAL HIGH (ref 4.0–10.5)
nRBC: 0 % (ref 0.0–0.2)

## 2019-09-19 LAB — COMPREHENSIVE METABOLIC PANEL
ALT: 20 U/L (ref 0–44)
AST: 17 U/L (ref 15–41)
Albumin: 3.3 g/dL — ABNORMAL LOW (ref 3.5–5.0)
Alkaline Phosphatase: 55 U/L (ref 38–126)
Anion gap: 6 (ref 5–15)
BUN: 9 mg/dL (ref 6–20)
CO2: 28 mmol/L (ref 22–32)
Calcium: 8.6 mg/dL — ABNORMAL LOW (ref 8.9–10.3)
Chloride: 103 mmol/L (ref 98–111)
Creatinine, Ser: 0.69 mg/dL (ref 0.44–1.00)
GFR calc Af Amer: >60 mL/min (ref >60)
GFR calc non Af Amer: >60 mL/min (ref >60)
Glucose, Bld: 97 mg/dL (ref 70–99)
Potassium: 3.8 mmol/L (ref 3.5–5.1)
Sodium: 137 mmol/L (ref 135–145)
Total Bilirubin: 1.4 mg/dL — ABNORMAL HIGH (ref 0.3–1.2)
Total Protein: 6.2 g/dL — ABNORMAL LOW (ref 6.5–8.1)

## 2019-09-19 LAB — LIPASE, BLOOD: Lipase: 16 U/L (ref 11–51)

## 2019-09-19 MED ORDER — ONDANSETRON 8 MG PO TBDP: 8.0000 mg | ORAL_TABLET | Freq: Three times a day (TID) | ORAL | 0 refills | Status: AC | PRN

## 2019-09-19 MED ORDER — LOPERAMIDE HCL 2 MG PO CAPS: 2.0000 mg | ORAL_CAPSULE | Freq: Four times a day (QID) | ORAL | 0 refills | Status: AC | PRN

## 2019-09-19 MED ORDER — FAMOTIDINE 20 MG PO TABS: 20.0000 mg | ORAL_TABLET | Freq: Two times a day (BID) | ORAL | 0 refills | Status: AC

## 2019-09-19 NOTE — Telephone Encounter (Signed)
Patient called and said she has started having a bowel movement during her sleep ,she says she has also been vomiting this has been going on for 3 days ,please when she goes to the bathroom she says only liquid comes out .please call her at [phone]

## 2019-09-19 NOTE — Telephone Encounter (Signed)
Patient states she may have food poisoning- she ate at cook out (Friday night- symptoms started that morning). Patient had 2 episodes of incontinent bowel, vomiting yesterday. Patient is having abdominal pain. Patient used OTC medication- Pepto bismol .  Reason for Disposition . [1] MILD-MODERATE pain AND [2] constant AND [3] present > 2 hours  Answer Assessment - Initial Assessment Questions 1. LOCATION: "Where does it hurt?"      R upper 2. RADIATION: "Does the pain shoot anywhere else?" (e.g., chest, back)     In intestinal area 3. ONSET: "When did the pain begin?" (e.g., minutes, hours or days ago)      Saturday morning 4. SUDDEN: "Gradual or sudden onset?"     sudden 5. PATTERN "Does the pain come and go, or is it constant?"    - If constant: "Is it getting better, staying the same, or worsening?"      (Note: Constant means the pain never goes away completely; most serious pain is constant and it progresses)     - If intermittent: "How long does it last?" "Do you have pain now?"     (Note: Intermittent means the pain goes away completely between bouts)     constant 6. SEVERITY: "How bad is the pain?"  (e.g., Scale 1-10; mild, moderate, or severe)   - MILD (1-3): doesn't interfere with normal activities, abdomen soft and not tender to touch    - MODERATE (4-7): interferes with normal activities or awakens from sleep, tender to touch    - SEVERE (8-10): excruciating pain, doubled over, unable to do any normal activities      7- moderate 7. RECURRENT SYMPTOM: "Have you ever had this type of abdominal pain before?" If so, ask: "When was the last time?" and "What happened that time?"      Yes- gallbladder  8. CAUSE: "What do you think is causing the abdominal pain?"     Bad food 9. RELIEVING/AGGRAVATING FACTORS: "What makes it better or worse?" (e.g., movement, antacids, bowel movement)     No- patient can't eat 10. OTHER SYMPTOMS: "Has there been any vomiting, diarrhea, constipation,  or urine problems?"       Vomiting, uncontrolled diarrhea 11. PREGNANCY: "Is there any chance you are pregnant?" "When was your last menstrual period?"       No- LMP- 3/5  Protocols used: ABDOMINAL PAIN - [facility]

## 2019-09-19 NOTE — ED Provider Notes (Signed)
[place]    CSN: [id number] Arrival date & time: 09/19/19  1343      History   Chief Complaint Chief Complaint  Patient presents with  . Abdominal Pain    HPI Katrina [name] is a 29 y.o. female.   29 yo established [facility] female patient with abdominal pain, vomiting, diarrhea and episode of incontinence while sleeping.  Onset two days ago.  Notes some upper abdominal crampy pain.  Unemployed at present.  Worried about her liver or pancreas   PMHx:   H/o biliary colic and S/P cholecystectomy   H/O opiate dependence   Note from 1 year ago: Katrina [name] is a 29 y.o. female hx significant for asthma, HA, HSV, and migraines, who presents with complaint of RUQ abdominal pain x 5 days ago.  Denies a precipitating event, trauma, close contacts with similar symptoms, recent travel or antibiotic use.  Tested positive for COVID 2 weeks ago, symptoms have improved.  Describes pain as constant and sharp in character.  Has not tried OTC medications.  Symptoms made worse with laying down.  Reports similar symptoms in the past related to gallbladder disease, had cholecystectomy.  Last BM yesterday, less than normal and with diarrhea.  Complains of associated nausea, decreased appetite, facial swelling, jaundice, pale colored stools, and diarrhea.        Past Medical History:  Diagnosis Date  . Asthma   . Headache in pregnancy   . Herpes simplex of female genitalia    last outbreak 4 months ago  . Migraine   . Migraines     Patient Active Problem List   Diagnosis Date Noted  . Opiate dependence ([place]) 03/26/2017    03/25/2017    09/27/2015  . Biliary colic Q000111Q  . Recurrent biliary colic Q000111Q  .  [id number]  .    .    .  12/19/2014  . Migraine without aura and without status migrainosus, not intractable 08/21/2014  .  08/21/2014  .    .    .    .    .    .  05/26/2014  . Smoker 05/26/2014  . Breast lump on right side at 10 o'clock position  03/21/2014  . 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 CESAREAN SECTION N/A 03/27/2017   Procedure: CESAREAN SECTION;  Surgeon: Jerelyn Charles, MD;  Location: [place];  Service: Obstetrics;  Laterality: N/A;  . CHOLECYSTECTOMY N/A 01/17/2015   Procedure: LAPAROSCOPIC CHOLECYSTECTOMY WITH INTRAOPERATIVE CHOLANGIOGRAM;  Surgeon: Excell Seltzer, MD;  Location: [facility];  Service: General;  Laterality: N/A;  . COLPOSCOPY  03/2014    OB History    Gravida  4   Para  4   Term  3   Preterm  1   AB  0   Living  4     SAB  0   TAB  0   Ectopic  0   Multiple  0   Live Births  4            Home Medications    Prior to Admission medications   Medication Sig Start Date End Date Taking? Authorizing Provider  bismuth subsalicylate (PEPTO BISMOL) 262 MG/15ML suspension Take 30 mLs by mouth every 6 (six) hours as needed.   Yes [provider]  albuterol (PROVENTIL HFA;VENTOLIN HFA)  108 (90 BASE) MCG/ACT inhaler Inhale 2 puffs into the lungs every 6 (six) hours as needed for wheezing or shortness of breath.    [provider]  famotidine (PEPCID) 20 MG tablet Take 1 tablet (20 mg total) by mouth 2 (two) times daily. 09/19/19   Robyn Haber, MD  loperamide (IMODIUM) 2 MG capsule Take 1 capsule (2 mg total) by mouth 4 (four) times daily as needed for diarrhea or loose stools. 09/19/19   Robyn Haber, MD  ondansetron (ZOFRAN-ODT) 8 MG disintegrating tablet Take 1 tablet (8 mg total) by mouth every 8 (eight) hours as needed for nausea. 09/19/19   Robyn Haber, MD  fluticasone (FLONASE) 50 MCG/ACT nasal spray Place 1 spray into both nostrils daily. 01/08/19 01/12/19  Petrucelli, Glynda Jaeger, PA-C    Family History Family History  Problem Relation Age of Onset  . Asthma Mother   .  Asthma Sister   . Asthma Sister     Social History Social History   Tobacco Use  . Smoking status: Current Every Day Smoker    Packs/day: 0.25    Years: 2.00    Pack years: 0.50    Types: Cigarettes    Last attempt to quit: 05/02/2013    Years since quitting: 6.3  . Smokeless tobacco: Never Used  Substance Use Topics  . Alcohol use: Not Currently    Alcohol/week: 0.0 standard drinks    Comment: socially  . Drug use: Not Currently    Frequency: 21.0 times per week    Types: Other-see comments    Comment: percocet; 3 per day per pt.      Allergies   Shellfish allergy, Other, and Penicillins   Review of Systems Review of Systems  Constitutional: Negative.   HENT: Negative.   Respiratory: Negative for chest tightness and shortness of breath.   Cardiovascular: Negative for chest pain.  Gastrointestinal: Positive for abdominal pain, diarrhea, nausea and vomiting.  Genitourinary: Negative.   All other systems reviewed and are negative.    Physical Exam Triage Vital Signs ED Triage Vitals  Enc Vitals Group     BP      Pulse      Resp      Temp      Temp src      SpO2      Weight      Height      Head Circumference      Peak Flow      Pain Score      Pain Loc      Pain Edu?      Excl. in [place]?    No data found.  Updated Vital Signs BP 114/78 (BP Location: Right Arm)   Pulse 94   Temp 97.8 F (36.6 C) (Oral)   Resp 18   LMP 09/02/2019   SpO2 98%    Physical Exam   UC Treatments / Results  Labs (all labs ordered are listed, but only abnormal results are displayed) Labs Reviewed  COMPREHENSIVE METABOLIC PANEL  CBC WITH DIFFERENTIAL/PLATELET  LIPASE, BLOOD    EKG   Radiology No results found.  Procedures Procedures (including critical care time)  Medications Ordered in UC Medications - No data to display  Initial Impression / Assessment and Plan / UC Course  I have reviewed the triage vital signs and the nursing notes.  Pertinent  labs & imaging results that were available during my care of the patient were reviewed by me and considered in  my medical decision making (see chart for details).    Final Clinical Impressions(s) / UC Diagnoses   Final diagnoses:  Nausea vomiting and diarrhea     Discharge Instructions     Clear liquids for the next 24 hours  If symptoms worsen, go to emergency department  If you're not improving, return for follow up    ED Prescriptions    Medication Sig Dispense Auth. Provider   famotidine (PEPCID) 20 MG tablet Take 1 tablet (20 mg total) by mouth 2 (two) times daily. 30 tablet Robyn Haber, MD   loperamide (IMODIUM) 2 MG capsule Take 1 capsule (2 mg total) by mouth 4 (four) times daily as needed for diarrhea or loose stools. 12 capsule Robyn Haber, MD   ondansetron (ZOFRAN-ODT) 8 MG disintegrating tablet Take 1 tablet (8 mg total) by mouth every 8 (eight) hours as needed for nausea. 12 tablet Robyn Haber, MD     I have reviewed the PDMP during this encounter.   Robyn Haber, MD 09/19/19 1441

## 2019-09-19 NOTE — Telephone Encounter (Signed)
    Documentation    Patient called and said she has started having a bowel movement during her sleep ,she says she has also been vomiting this has been going on for 3 days ,please when she goes to the bathroom she says only liquid comes out .please call her at [phone]      Documentation

## 2019-09-19 NOTE — Telephone Encounter (Signed)
See triage note.

## 2019-09-19 NOTE — Discharge Instructions (Addendum)
Clear liquids for the next 24 hours  If symptoms worsen, go to emergency department  If you're not improving, return for follow up

## 2019-09-19 NOTE — ED Triage Notes (Signed)
Patient has had abdominal pain and vomiting and diarrhea since Saturday.  Patient reports having had a bowel movement while asleep

## 2019-09-20 ENCOUNTER — Other Ambulatory Visit: Payer: Self-pay

## 2019-09-27 ENCOUNTER — Ambulatory Visit: Payer: Self-pay

## 2019-09-29 ENCOUNTER — Other Ambulatory Visit: Payer: Self-pay

## 2019-10-05 ENCOUNTER — Encounter (HOSPITAL_COMMUNITY): Payer: Self-pay

## 2019-10-05 ENCOUNTER — Emergency Department (HOSPITAL_COMMUNITY)
Admission: EM | Admit: 2019-10-05 | Discharge: 2019-10-05 | Disposition: A | Discharge status: Home / Self Care | Payer: Self-pay | Attending: Emergency Medicine | Admitting: Emergency Medicine

## 2019-10-05 DIAGNOSIS — Z20822 Contact with and (suspected) exposure to covid-19: Secondary | ICD-10-CM | POA: Insufficient documentation

## 2019-10-05 DIAGNOSIS — R112 Nausea with vomiting, unspecified: Secondary | ICD-10-CM | POA: Insufficient documentation

## 2019-10-05 DIAGNOSIS — F19920 Other psychoactive substance use, unspecified with intoxication, uncomplicated: Secondary | ICD-10-CM | POA: Insufficient documentation

## 2019-10-05 DIAGNOSIS — Z79899 Other long term (current) drug therapy: Secondary | ICD-10-CM | POA: Insufficient documentation

## 2019-10-05 DIAGNOSIS — F17210 Nicotine dependence, cigarettes, uncomplicated: Secondary | ICD-10-CM | POA: Insufficient documentation

## 2019-10-05 DIAGNOSIS — J45909 Unspecified asthma, uncomplicated: Secondary | ICD-10-CM | POA: Insufficient documentation

## 2019-10-05 LAB — COMPREHENSIVE METABOLIC PANEL
ALT: 25 U/L (ref 0–44)
AST: 20 U/L (ref 15–41)
Albumin: 3.1 g/dL — ABNORMAL LOW (ref 3.5–5.0)
Alkaline Phosphatase: 49 U/L (ref 38–126)
Anion gap: 6 (ref 5–15)
BUN: 7 mg/dL (ref 6–20)
CO2: 26 mmol/L (ref 22–32)
Calcium: 8.3 mg/dL — ABNORMAL LOW (ref 8.9–10.3)
Chloride: 109 mmol/L (ref 98–111)
Creatinine, Ser: 0.64 mg/dL (ref 0.44–1.00)
GFR calc Af Amer: >60 mL/min (ref >60)
GFR calc non Af Amer: >60 mL/min (ref >60)
Glucose, Bld: 102 mg/dL — ABNORMAL HIGH (ref 70–99)
Potassium: 4 mmol/L (ref 3.5–5.1)
Sodium: 141 mmol/L (ref 135–145)
Total Bilirubin: 0.6 mg/dL (ref 0.3–1.2)
Total Protein: 5.5 g/dL — ABNORMAL LOW (ref 6.5–8.1)

## 2019-10-05 LAB — CBC
HCT: 35.2 % — ABNORMAL LOW (ref 36.0–46.0)
Hemoglobin: 12.3 g/dL (ref 12.0–15.0)
MCH: 31.4 pg (ref 26.0–34.0)
MCHC: 34.9 g/dL (ref 30.0–36.0)
MCV: 89.8 fL (ref 80.0–100.0)
Platelets: 251 10*3/uL (ref 150–400)
RBC: 3.92 MIL/uL (ref 3.87–5.11)
RDW: 12.9 % (ref 11.5–15.5)
WBC: 7.2 10*3/uL (ref 4.0–10.5)
nRBC: 0 % (ref 0.0–0.2)

## 2019-10-05 LAB — I-STAT BETA HCG BLOOD, ED (MC, WL, AP ONLY): I-stat hCG, quantitative: <5 m[IU]/mL (ref <5)

## 2019-10-05 LAB — LIPASE, BLOOD: Lipase: 16 U/L (ref 11–51)

## 2019-10-05 LAB — POC SARS CORONAVIRUS 2 AG -  ED: SARS Coronavirus 2 Ag: NEGATIVE

## 2019-10-05 MED ORDER — ALUM & MAG HYDROXIDE-SIMETH 200-200-20 MG/5ML PO SUSP
30.0000 mL | Freq: Once | ORAL | Status: AC
  Administered 2019-10-05: 30 mL via ORAL
  Filled 2019-10-05: qty 30

## 2019-10-05 MED ORDER — SODIUM CHLORIDE 0.9 % IV BOLUS
500.0000 mL | Freq: Once | INTRAVENOUS | Status: AC
  Administered 2019-10-05: 19:00:00 500 mL via INTRAVENOUS

## 2019-10-05 MED ORDER — SODIUM CHLORIDE 0.9% FLUSH
3.0000 mL | Freq: Once | INTRAVENOUS | Status: AC
  Administered 2019-10-05: 3 mL via INTRAVENOUS

## 2019-10-05 MED ORDER — ONDANSETRON HCL 4 MG/2ML IJ SOLN
4.0000 mg | Freq: Once | INTRAMUSCULAR | Status: AC
  Administered 2019-10-05: 4 mg via INTRAVENOUS
  Filled 2019-10-05: qty 2

## 2019-10-05 MED ORDER — FAMOTIDINE IN NACL 20-0.9 MG/50ML-% IV SOLN
20.0000 mg | Freq: Once | INTRAVENOUS | Status: AC
  Administered 2019-10-05: 20 mg via INTRAVENOUS
  Filled 2019-10-05: qty 50
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
## 2019-10-05 NOTE — Discharge Instructions (Addendum)
Please avoid using street drugs like heroin, methamphetamines, or opioids.  These can cause you to become very sick, can lead to infections, and can lead to overdoses which can cause death.

## 2019-10-05 NOTE — ED Notes (Signed)
[employer] states pt is able to leave on her own after being discharged.
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
## 2021-12-28 ENCOUNTER — Other Ambulatory Visit: Payer: Self-pay

## 2021-12-28 ENCOUNTER — Inpatient Hospital Stay (HOSPITAL_COMMUNITY): Payer: Self-pay

## 2021-12-28 ENCOUNTER — Emergency Department (HOSPITAL_COMMUNITY): Payer: Self-pay

## 2021-12-28 ENCOUNTER — Encounter (HOSPITAL_COMMUNITY): Payer: Self-pay | Admitting: Student

## 2021-12-28 ENCOUNTER — Inpatient Hospital Stay (HOSPITAL_COMMUNITY)
Admission: EM | Admit: 2021-12-28 | Discharge: 2022-01-02 | DRG: 871 | Discharge status: Against Medical Advice | Payer: Self-pay | Attending: Internal Medicine | Admitting: Internal Medicine

## 2021-12-28 DIAGNOSIS — E876 Hypokalemia: Secondary | ICD-10-CM | POA: Diagnosis present

## 2021-12-28 DIAGNOSIS — R6521 Severe sepsis with septic shock: Secondary | ICD-10-CM | POA: Diagnosis present

## 2021-12-28 DIAGNOSIS — Y929 Unspecified place or not applicable: Secondary | ICD-10-CM

## 2021-12-28 DIAGNOSIS — R571 Hypovolemic shock: Secondary | ICD-10-CM | POA: Diagnosis present

## 2021-12-28 DIAGNOSIS — R162 Hepatomegaly with splenomegaly, not elsewhere classified: Secondary | ICD-10-CM | POA: Diagnosis present

## 2021-12-28 DIAGNOSIS — N390 Urinary tract infection, site not specified: Secondary | ICD-10-CM | POA: Diagnosis present

## 2021-12-28 DIAGNOSIS — F17210 Nicotine dependence, cigarettes, uncomplicated: Secondary | ICD-10-CM | POA: Diagnosis present

## 2021-12-28 DIAGNOSIS — K831 Obstruction of bile duct: Secondary | ICD-10-CM | POA: Diagnosis present

## 2021-12-28 DIAGNOSIS — Z880 Allergy status to penicillin: Secondary | ICD-10-CM

## 2021-12-28 DIAGNOSIS — A411 Sepsis due to other specified staphylococcus: Principal | ICD-10-CM | POA: Diagnosis present

## 2021-12-28 DIAGNOSIS — E8809 Other disorders of plasma-protein metabolism, not elsewhere classified: Secondary | ICD-10-CM | POA: Diagnosis present

## 2021-12-28 DIAGNOSIS — N179 Acute kidney failure, unspecified: Secondary | ICD-10-CM | POA: Diagnosis present

## 2021-12-28 DIAGNOSIS — Z79899 Other long term (current) drug therapy: Secondary | ICD-10-CM

## 2021-12-28 DIAGNOSIS — E871 Hypo-osmolality and hyponatremia: Secondary | ICD-10-CM | POA: Diagnosis present

## 2021-12-28 DIAGNOSIS — T50995A Adverse effect of other drugs, medicaments and biological substances, initial encounter: Secondary | ICD-10-CM | POA: Diagnosis present

## 2021-12-28 DIAGNOSIS — B002 Herpesviral gingivostomatitis and pharyngotonsillitis: Secondary | ICD-10-CM | POA: Diagnosis present

## 2021-12-28 DIAGNOSIS — G928 Other toxic encephalopathy: Secondary | ICD-10-CM | POA: Diagnosis present

## 2021-12-28 DIAGNOSIS — Z825 Family history of asthma and other chronic lower respiratory diseases: Secondary | ICD-10-CM

## 2021-12-28 DIAGNOSIS — A419 Sepsis, unspecified organism: Principal | ICD-10-CM | POA: Diagnosis present

## 2021-12-28 DIAGNOSIS — R21 Rash and other nonspecific skin eruption: Secondary | ICD-10-CM | POA: Diagnosis present

## 2021-12-28 DIAGNOSIS — Z5329 Procedure and treatment not carried out because of patient's decision for other reasons: Secondary | ICD-10-CM | POA: Diagnosis present

## 2021-12-28 DIAGNOSIS — K7460 Unspecified cirrhosis of liver: Secondary | ICD-10-CM | POA: Diagnosis present

## 2021-12-28 DIAGNOSIS — D649 Anemia, unspecified: Secondary | ICD-10-CM | POA: Diagnosis present

## 2021-12-28 DIAGNOSIS — F1120 Opioid dependence, uncomplicated: Secondary | ICD-10-CM | POA: Diagnosis present

## 2021-12-28 DIAGNOSIS — Z91013 Allergy to seafood: Secondary | ICD-10-CM

## 2021-12-28 DIAGNOSIS — Z888 Allergy status to other drugs, medicaments and biological substances status: Secondary | ICD-10-CM

## 2021-12-28 LAB — CBC WITH DIFFERENTIAL/PLATELET
Abs Immature Granulocytes: 0.14 10*3/uL — ABNORMAL HIGH (ref 0.00–0.07)
Basophils Absolute: 0 10*3/uL (ref 0.0–0.1)
Basophils Relative: 1 %
Eosinophils Absolute: 0 10*3/uL (ref 0.0–0.5)
Eosinophils Relative: 0 %
HCT: 37.8 % (ref 36.0–46.0)
Hemoglobin: 13 g/dL (ref 12.0–15.0)
Immature Granulocytes: 2 %
Lymphocytes Relative: 5 %
Lymphs Abs: 0.3 10*3/uL — ABNORMAL LOW (ref 0.7–4.0)
MCH: 29.6 pg (ref 26.0–34.0)
MCHC: 34.4 g/dL (ref 30.0–36.0)
MCV: 86.1 fL (ref 80.0–100.0)
Monocytes Absolute: 0 10*3/uL — ABNORMAL LOW (ref 0.1–1.0)
Monocytes Relative: 1 %
Neutro Abs: 5.3 10*3/uL (ref 1.7–7.7)
Neutrophils Relative %: 91 %
Platelets: 158 10*3/uL (ref 150–400)
RBC: 4.39 MIL/uL (ref 3.87–5.11)
RDW: 13.8 % (ref 11.5–15.5)
WBC: 5.8 10*3/uL (ref 4.0–10.5)
nRBC: 0.3 % — ABNORMAL HIGH (ref 0.0–0.2)

## 2021-12-28 LAB — COMPREHENSIVE METABOLIC PANEL
ALT: 24 U/L (ref 0–44)
ALT: 22 U/L (ref 0–44)
AST: 42 U/L — ABNORMAL HIGH (ref 15–41)
AST: 35 U/L (ref 15–41)
Albumin: 3.3 g/dL — ABNORMAL LOW (ref 3.5–5.0)
Albumin: 2.3 g/dL — ABNORMAL LOW (ref 3.5–5.0)
Alkaline Phosphatase: 127 U/L — ABNORMAL HIGH (ref 38–126)
Alkaline Phosphatase: 69 U/L (ref 38–126)
Anion gap: 11 (ref 5–15)
Anion gap: 9 (ref 5–15)
BUN: 21 mg/dL — ABNORMAL HIGH (ref 6–20)
BUN: 24 mg/dL — ABNORMAL HIGH (ref 6–20)
CO2: 20 mmol/L — ABNORMAL LOW (ref 22–32)
CO2: 19 mmol/L — ABNORMAL LOW (ref 22–32)
Calcium: 7.8 mg/dL — ABNORMAL LOW (ref 8.9–10.3)
Calcium: 7.1 mg/dL — ABNORMAL LOW (ref 8.9–10.3)
Chloride: 109 mmol/L (ref 98–111)
Chloride: 112 mmol/L — ABNORMAL HIGH (ref 98–111)
Creatinine, Ser: 1.59 mg/dL — ABNORMAL HIGH (ref 0.44–1.00)
Creatinine, Ser: 1.76 mg/dL — ABNORMAL HIGH (ref 0.44–1.00)
GFR, Estimated: 44 mL/min — ABNORMAL LOW (ref >60)
GFR, Estimated: 39 mL/min — ABNORMAL LOW (ref >60)
Glucose, Bld: 88 mg/dL (ref 70–99)
Glucose, Bld: 130 mg/dL — ABNORMAL HIGH (ref 70–99)
Potassium: 2.3 mmol/L — CL (ref 3.5–5.1)
Potassium: 2.9 mmol/L — ABNORMAL LOW (ref 3.5–5.1)
Sodium: 140 mmol/L (ref 135–145)
Sodium: 140 mmol/L (ref 135–145)
Total Bilirubin: 6 mg/dL — ABNORMAL HIGH (ref 0.3–1.2)
Total Bilirubin: 4.3 mg/dL — ABNORMAL HIGH (ref 0.3–1.2)
Total Protein: 6.5 g/dL (ref 6.5–8.1)
Total Protein: 4.8 g/dL — ABNORMAL LOW (ref 6.5–8.1)

## 2021-12-28 LAB — DIC (DISSEMINATED INTRAVASCULAR COAGULATION)PANEL
D-Dimer, Quant: >20 ug/mL-FEU — ABNORMAL HIGH (ref 0.00–0.50)
Fibrinogen: 94 mg/dL — CL (ref 210–475)
INR: 2.3 — ABNORMAL HIGH (ref 0.8–1.2)
Platelets: 109 10*3/uL — ABNORMAL LOW (ref 150–400)
Prothrombin Time: 24.8 seconds — ABNORMAL HIGH (ref 11.4–15.2)
Smear Review: NONE SEEN
aPTT: 66 seconds — ABNORMAL HIGH (ref 24–36)

## 2021-12-28 LAB — RAPID URINE DRUG SCREEN, HOSP PERFORMED
Amphetamines: POSITIVE — AB
Barbiturates: NOT DETECTED
Benzodiazepines: NOT DETECTED
Cocaine: NOT DETECTED
Opiates: POSITIVE — AB
Tetrahydrocannabinol: NOT DETECTED

## 2021-12-28 LAB — LIPASE, BLOOD
Lipase: 25 U/L (ref 11–51)
Lipase: 23 U/L (ref 11–51)

## 2021-12-28 LAB — TYPE AND SCREEN
ABO/RH(D): O NEG
Antibody Screen: NEGATIVE

## 2021-12-28 LAB — MAGNESIUM: Magnesium: 1.2 mg/dL — ABNORMAL LOW (ref 1.7–2.4)

## 2021-12-28 LAB — BILIRUBIN, FRACTIONATED(TOT/DIR/INDIR)
Bilirubin, Direct: 2.5 mg/dL — ABNORMAL HIGH (ref 0.0–0.2)
Indirect Bilirubin: 1.7 mg/dL — ABNORMAL HIGH (ref 0.3–0.9)
Total Bilirubin: 4.2 mg/dL — ABNORMAL HIGH (ref 0.3–1.2)

## 2021-12-28 LAB — URINALYSIS, ROUTINE W REFLEX MICROSCOPIC
Bilirubin Urine: NEGATIVE
Glucose, UA: NEGATIVE mg/dL
Ketones, ur: NEGATIVE mg/dL
Nitrite: NEGATIVE
Protein, ur: >=300 mg/dL — AB
Specific Gravity, Urine: 1.012 (ref 1.005–1.030)
pH: 5 (ref 5.0–8.0)

## 2021-12-28 LAB — LACTIC ACID, PLASMA
Lactic Acid, Venous: 5 mmol/L (ref 0.5–1.9)
Lactic Acid, Venous: 4 mmol/L (ref 0.5–1.9)
Lactic Acid, Venous: 3.6 mmol/L (ref 0.5–1.9)
Lactic Acid, Venous: 2.7 mmol/L (ref 0.5–1.9)

## 2021-12-28 LAB — HEPATIC FUNCTION PANEL
ALT: 23 U/L (ref 0–44)
AST: 31 U/L (ref 15–41)
Albumin: 2.4 g/dL — ABNORMAL LOW (ref 3.5–5.0)
Alkaline Phosphatase: 74 U/L (ref 38–126)
Bilirubin, Direct: 2.7 mg/dL — ABNORMAL HIGH (ref 0.0–0.2)
Indirect Bilirubin: 1.8 mg/dL — ABNORMAL HIGH (ref 0.3–0.9)
Total Bilirubin: 4.5 mg/dL — ABNORMAL HIGH (ref 0.3–1.2)
Total Protein: 5.1 g/dL — ABNORMAL LOW (ref 6.5–8.1)

## 2021-12-28 LAB — PROTIME-INR
INR: 2.2 — ABNORMAL HIGH (ref 0.8–1.2)
Prothrombin Time: 24.3 seconds — ABNORMAL HIGH (ref 11.4–15.2)

## 2021-12-28 LAB — APTT: aPTT: 70 seconds — ABNORMAL HIGH (ref 24–36)

## 2021-12-28 LAB — RETICULOCYTES
Immature Retic Fract: 5.7 % (ref 2.3–15.9)
RBC.: 3.42 MIL/uL — ABNORMAL LOW (ref 3.87–5.11)
Retic Count, Absolute: 46.2 10*3/uL (ref 19.0–186.0)
Retic Ct Pct: 1.4 % (ref 0.4–3.1)

## 2021-12-28 LAB — I-STAT BETA HCG BLOOD, ED (MC, WL, AP ONLY): I-stat hCG, quantitative: <5 m[IU]/mL (ref <5)

## 2021-12-28 LAB — DIRECT ANTIGLOBULIN TEST (NOT AT ARMC)
DAT, IgG: NEGATIVE
DAT, complement: NEGATIVE

## 2021-12-28 LAB — HIV ANTIBODY (ROUTINE TESTING W REFLEX): HIV Screen 4th Generation wRfx: NONREACTIVE

## 2021-12-28 LAB — CK: Total CK: 134 U/L (ref 38–234)

## 2021-12-28 LAB — MRSA NEXT GEN BY PCR, NASAL: MRSA by PCR Next Gen: NOT DETECTED

## 2021-12-28 LAB — LACTATE DEHYDROGENASE: LDH: 280 U/L — ABNORMAL HIGH (ref 98–192)

## 2021-12-28 MED ORDER — VANCOMYCIN HCL IN DEXTROSE 1-5 GM/200ML-% IV SOLN
1000.0000 mg | Freq: Once | INTRAVENOUS | Status: AC
  Administered 2021-12-28: 1000 mg via INTRAVENOUS
  Filled 2021-12-28: qty 200

## 2021-12-28 MED ORDER — LACTATED RINGERS IV BOLUS
1000.0000 mL | Freq: Once | INTRAVENOUS | Status: AC
  Administered 2021-12-28: 1000 mL via INTRAVENOUS

## 2021-12-28 MED ORDER — NOREPINEPHRINE 4 MG/250ML-% IV SOLN
0.0000 ug/min | INTRAVENOUS | Status: DC
  Administered 2021-12-28: 14 ug/min via INTRAVENOUS
  Administered 2021-12-28: 15 ug/min via INTRAVENOUS
  Filled 2021-12-28 (×2): qty 250

## 2021-12-28 MED ORDER — HYDROMORPHONE HCL 1 MG/ML IJ SOLN
0.5000 mg | INTRAMUSCULAR | Status: DC | PRN
  Administered 2021-12-28: 0.5 mg via INTRAVENOUS
  Filled 2021-12-28: qty 1

## 2021-12-28 MED ORDER — CIPROFLOXACIN IN D5W 400 MG/200ML IV SOLN
400.0000 mg | Freq: Once | INTRAVENOUS | Status: AC
  Administered 2021-12-28: 400 mg via INTRAVENOUS
  Filled 2021-12-28: qty 200

## 2021-12-28 MED ORDER — VANCOMYCIN HCL 750 MG/150ML IV SOLN
750.0000 mg | INTRAVENOUS | Status: DC
  Administered 2021-12-28: 750 mg via INTRAVENOUS
  Filled 2021-12-28: qty 150

## 2021-12-28 MED ORDER — KETAMINE BOLUS VIA INFUSION
0.3500 mg/kg | Freq: Once | INTRAVENOUS | Status: AC
  Administered 2021-12-28: 25.31 mg via INTRAVENOUS
  Filled 2021-12-28: qty 30

## 2021-12-28 MED ORDER — ORAL CARE MOUTH RINSE: 15.0000 mL | OROMUCOSAL | Status: DC | PRN

## 2021-12-28 MED ORDER — SODIUM CHLORIDE 0.9% FLUSH: 9.0000 mL | INTRAVENOUS | Status: DC | PRN

## 2021-12-28 MED ORDER — SODIUM CHLORIDE 0.9 % IV SOLN
2.0000 g | Freq: Two times a day (BID) | INTRAVENOUS | Status: DC
  Administered 2021-12-28 – 2021-12-29 (×2): 2 g via INTRAVENOUS
  Filled 2021-12-28 (×2): qty 12.5

## 2021-12-28 MED ORDER — POLYETHYLENE GLYCOL 3350 17 G PO PACK: 17.0000 g | PACK | Freq: Every day | ORAL | Status: DC | PRN

## 2021-12-28 MED ORDER — SODIUM CHLORIDE 0.9 % IV SOLN
0.1000 mg/kg/h | INTRAVENOUS | Status: DC
  Administered 2021-12-28 – 2021-12-30 (×4): 0.5 mg/kg/h via INTRAVENOUS
  Filled 2021-12-28 (×4): qty 10

## 2021-12-28 MED ORDER — DIPHENHYDRAMINE HCL 12.5 MG/5ML PO ELIX: 12.5000 mg | ORAL_SOLUTION | Freq: Four times a day (QID) | ORAL | Status: DC | PRN

## 2021-12-28 MED ORDER — LACTATED RINGERS IV SOLN: INTRAVENOUS | Status: DC

## 2021-12-28 MED ORDER — ONDANSETRON HCL 4 MG/2ML IJ SOLN
4.0000 mg | Freq: Four times a day (QID) | INTRAMUSCULAR | Status: DC | PRN
  Administered 2021-12-28 – 2022-01-02 (×11): 4 mg via INTRAVENOUS
  Filled 2021-12-28 (×11): qty 2

## 2021-12-28 MED ORDER — METRONIDAZOLE 500 MG/100ML IV SOLN
500.0000 mg | Freq: Once | INTRAVENOUS | Status: AC
  Administered 2021-12-28: 500 mg via INTRAVENOUS
  Filled 2021-12-28: qty 100

## 2021-12-28 MED ORDER — POTASSIUM CHLORIDE CRYS ER 20 MEQ PO TBCR
40.0000 meq | EXTENDED_RELEASE_TABLET | Freq: Once | ORAL | Status: AC
  Administered 2021-12-28: 40 meq via ORAL
  Filled 2021-12-28: qty 2

## 2021-12-28 MED ORDER — ONDANSETRON HCL 4 MG/2ML IJ SOLN
4.0000 mg | Freq: Once | INTRAMUSCULAR | Status: AC
  Administered 2021-12-28: 4 mg via INTRAVENOUS
  Filled 2021-12-28: qty 2

## 2021-12-28 MED ORDER — VANCOMYCIN HCL IN DEXTROSE 750-5 MG/150ML-% IV SOLN
750.0000 mg | INTRAVENOUS | Status: DC
  Filled 2021-12-28: qty 150

## 2021-12-28 MED ORDER — POTASSIUM CHLORIDE 10 MEQ/100ML IV SOLN
10.0000 meq | INTRAVENOUS | Status: AC
  Administered 2021-12-28 (×3): 10 meq via INTRAVENOUS
  Filled 2021-12-28 (×3): qty 100

## 2021-12-28 MED ORDER — ACETAMINOPHEN 325 MG PO TABS
650.0000 mg | ORAL_TABLET | Freq: Four times a day (QID) | ORAL | Status: DC | PRN
  Administered 2021-12-28: 650 mg via ORAL
  Filled 2021-12-28: qty 2

## 2021-12-28 MED ORDER — IOHEXOL 300 MG/ML  SOLN
100.0000 mL | Freq: Once | INTRAMUSCULAR | Status: AC | PRN
  Administered 2021-12-28: 100 mL via INTRAVENOUS

## 2021-12-28 MED ORDER — DOCUSATE SODIUM 100 MG PO CAPS: 100.0000 mg | ORAL_CAPSULE | Freq: Two times a day (BID) | ORAL | Status: DC | PRN

## 2021-12-28 MED ORDER — SODIUM CHLORIDE 0.9 % IV BOLUS
500.0000 mL | Freq: Once | INTRAVENOUS | Status: AC
  Administered 2021-12-28: 500 mL via INTRAVENOUS

## 2021-12-28 MED ORDER — POTASSIUM CHLORIDE 10 MEQ/100ML IV SOLN
10.0000 meq | INTRAVENOUS | Status: AC
  Administered 2021-12-28 (×4): 10 meq via INTRAVENOUS
  Filled 2021-12-28 (×4): qty 100

## 2021-12-28 MED ORDER — HYDROMORPHONE HCL 1 MG/ML IJ SOLN
0.5000 mg | Freq: Once | INTRAMUSCULAR | Status: AC
  Administered 2021-12-28: 0.5 mg via INTRAVENOUS
  Filled 2021-12-28: qty 1

## 2021-12-28 MED ORDER — HYDROMORPHONE HCL 1 MG/ML IJ SOLN
1.0000 mg | Freq: Once | INTRAMUSCULAR | Status: AC
  Administered 2021-12-28: 1 mg via INTRAVENOUS
  Filled 2021-12-28: qty 1

## 2021-12-28 MED ORDER — SODIUM CHLORIDE 0.9% FLUSH
10.0000 mL | Freq: Two times a day (BID) | INTRAVENOUS | Status: DC
  Administered 2021-12-28 – 2022-01-01 (×10): 10 mL

## 2021-12-28 MED ORDER — SODIUM CHLORIDE 0.9 % IV SOLN
250.0000 mL | INTRAVENOUS | Status: DC
  Administered 2021-12-30 – 2022-01-01 (×2): 250 mL via INTRAVENOUS

## 2021-12-28 MED ORDER — HYDROMORPHONE 1 MG/ML IV SOLN
INTRAVENOUS | Status: DC
  Administered 2021-12-28: 30 mg via INTRAVENOUS
  Administered 2021-12-29: 0.6 mg via INTRAVENOUS
  Administered 2021-12-29: 0.9 mg via INTRAVENOUS
  Administered 2021-12-30: 0.6 mg via INTRAVENOUS
  Administered 2021-12-30: 1.2 mg via INTRAVENOUS
  Administered 2021-12-30 (×2): 0.6 mg via INTRAVENOUS
  Administered 2021-12-30 – 2021-12-31 (×2): 0.3 mg via INTRAVENOUS
  Administered 2021-12-31: 1.8 mg via INTRAVENOUS
  Administered 2022-01-01 (×2): 0.9 mg via INTRAVENOUS
  Administered 2022-01-02: 1.5 mg via INTRAVENOUS
  Filled 2021-12-28 (×3): qty 30

## 2021-12-28 MED ORDER — DEXMEDETOMIDINE HCL IN NACL 200 MCG/50ML IV SOLN: 0.4000 ug/kg/h | INTRAVENOUS | Status: DC

## 2021-12-28 MED ORDER — HYDROCORTISONE SOD SUC (PF) 100 MG IJ SOLR
50.0000 mg | Freq: Once | INTRAMUSCULAR | Status: AC
  Administered 2021-12-28: 50 mg via INTRAVENOUS
  Filled 2021-12-28: qty 2

## 2021-12-28 MED ORDER — VITAMIN K1 10 MG/ML IJ SOLN
10.0000 mg | Freq: Once | INTRAMUSCULAR | Status: AC
  Administered 2021-12-28: 10 mg via INTRAVENOUS
  Filled 2021-12-28: qty 1

## 2021-12-28 MED ORDER — NALOXONE HCL 0.4 MG/ML IJ SOLN
0.4000 mg | INTRAMUSCULAR | Status: DC | PRN
  Filled 2021-12-28: qty 1

## 2021-12-28 MED ORDER — DEXMEDETOMIDINE HCL IN NACL 200 MCG/50ML IV SOLN
0.4000 ug/kg/h | INTRAVENOUS | Status: DC
  Administered 2021-12-28: 0.4 ug/kg/h via INTRAVENOUS
  Filled 2021-12-28: qty 50

## 2021-12-28 MED ORDER — DIPHENHYDRAMINE HCL 50 MG/ML IJ SOLN
12.5000 mg | Freq: Four times a day (QID) | INTRAMUSCULAR | Status: DC | PRN
  Administered 2021-12-30 – 2021-12-31 (×2): 12.5 mg via INTRAVENOUS
  Filled 2021-12-28 (×2): qty 1

## 2021-12-28 MED ORDER — METHYLPREDNISOLONE SODIUM SUCC 125 MG IJ SOLR: 125.0000 mg | Freq: Once | INTRAMUSCULAR | Status: DC

## 2021-12-28 MED ORDER — LACTATED RINGERS IV BOLUS
500.0000 mL | Freq: Once | INTRAVENOUS | Status: AC
  Administered 2021-12-28: 500 mL via INTRAVENOUS

## 2021-12-28 MED ORDER — CHLORHEXIDINE GLUCONATE CLOTH 2 % EX PADS
6.0000 | MEDICATED_PAD | Freq: Every day | CUTANEOUS | Status: DC
  Administered 2021-12-28 – 2022-01-01 (×5): 6 via TOPICAL

## 2021-12-28 MED ORDER — NOREPINEPHRINE 16 MG/250ML-% IV SOLN
0.0000 ug/min | INTRAVENOUS | Status: DC
  Administered 2021-12-28: 40 ug/min via INTRAVENOUS
  Administered 2021-12-29: 9 ug/min via INTRAVENOUS
  Filled 2021-12-28 (×2): qty 250

## 2021-12-28 MED ORDER — SODIUM CHLORIDE 0.9 % IV SOLN: INTRAVENOUS | Status: DC | PRN

## 2021-12-28 MED ORDER — NOREPINEPHRINE 4 MG/250ML-% IV SOLN
0.0000 ug/min | INTRAVENOUS | Status: DC
  Administered 2021-12-28: 2 ug/min via INTRAVENOUS
  Filled 2021-12-28: qty 250

## 2021-12-28 MED ORDER — NOREPINEPHRINE 4 MG/250ML-% IV SOLN: 2.0000 ug/min | INTRAVENOUS | Status: DC

## 2021-12-28 MED ORDER — SODIUM CHLORIDE 0.9 % IV BOLUS
1000.0000 mL | Freq: Once | INTRAVENOUS | Status: AC
  Administered 2021-12-28: 1000 mL via INTRAVENOUS

## 2021-12-28 MED ORDER — OXYCODONE HCL 5 MG/5ML PO SOLN
10.0000 mg | Freq: Once | ORAL | Status: AC
  Administered 2021-12-28: 10 mg via ORAL
  Filled 2021-12-28: qty 10

## 2021-12-28 MED ORDER — SODIUM CHLORIDE 0.9% FLUSH: 10.0000 mL | INTRAVENOUS | Status: DC | PRN

## 2021-12-28 MED ORDER — METRONIDAZOLE 500 MG/100ML IV SOLN
500.0000 mg | Freq: Two times a day (BID) | INTRAVENOUS | Status: DC
  Administered 2021-12-28 – 2021-12-29 (×2): 500 mg via INTRAVENOUS
  Filled 2021-12-28 (×2): qty 100

## 2021-12-28 NOTE — Progress Notes (Signed)
Pt continuosuly complaining of abd pain mainly upper left quadrant. Dr. Verlee Monte notified. Dilaudid given multiple times with no relief. MD ordered Dildudid PCA pump. Still no relief. GI consulted. Pt looks more lethargic & sweating, moaning. Complaining of constant abd pain. Dry heaving. Eyes are now jaundice as well as skin. Pt still on levo for soft BP. Precedex started for comfort/restlessness.Vitals stable at this time. MRI ordered.

## 2021-12-28 NOTE — H&P (Signed)
NAME:  Katrina [name], MRN:  [id number], DOB:  Jan 22, 1991, LOS: 0 ADMISSION DATE:  12/28/2021, CONSULTATION DATE:  12/28/21 REFERRING MD:  Sedonia Small, CHIEF COMPLAINT:  abdominal pain   History of Present Illness:  31yF with history of asthma, HSV, migraine, heroin use, cholecystectomy who presented to [facility] ED this morning for nausea, vomiting and epigastric pain. After she used heroin, woke up feeling hot, nauseated. She says it overall feels just like it did when she required cholecystectomy in 2016.    Here she was found to be in shock and received 4L total crystalloid boluses, started on LR MIVF, levo at 14 mcg/min, vanc/cefepime/flagyl with concern for sepsis and biliary source vs bacteremia vs pancreatitis. Femoral central line placed in ED.   Otherwise pertinent review of systems is negative.  Pertinent  Medical History  IVDU Cholecystectomy  Asthma HSV Migraines  Significant Hospital Events: Including procedures, antibiotic start and stop dates in addition to other pertinent events   12/28/21 admitted started on ABX, aggressive crystalloid resuscitation  Interim History / Subjective:    Objective   Blood pressure 121/85, pulse (!) 127, temperature (!) 100.8 F (38.2 C), temperature source Oral, resp. rate (!) 26, height '5\' 1"'$  (1.549 m), weight 72.3 kg, SpO2 99 %.        Intake/Output Summary (Last 24 hours) at 12/28/2021 1037 Last data filed at 12/28/2021 0946 Gross per 24 hour  Intake 1941.67 ml  Output --  Net 1941.67 ml   Filed Weights   12/28/21 0906  Weight: 72.3 kg    Examination: General appearance: 31 y.o., female, alternating uncomfortable and sleepy Eyes: PERRL, tracking appropriately HENT: NCAT; dry MM Neck: Trachea midline; no lymphadenopathy, no JVD Lungs: CTAB, no crackles, no wheeze, with normal respiratory effort CV: tachy RR, no murmur  Abdomen: Soft, non-distended, tender diffusely but especially over RUQ/epigastrium Extremities: some track marks,  warm Neuro: drowsy but attends to conversation, no focal deficit   Resolved Hospital Problem list     Assessment & Plan:  # Shock Hypovolemic and likely septic shock with potential biliary (?cholangitis) vs bacteremia as source vs distributive shock from pancreatitis (though lipase WNL).  - vanc, cefepime, flagyl follow cultures and narrow as able - give another 500cc LR, reassess  - wean levo for MAP 65 - follow LFTs, if bili rising and still in a great deal of abdominal pain will involve GI  # Lactic acidosis - trending down, if worsening abdominal pain/exam then repeat  # AKI - ctm response to fluid resuscitation - avoid nephrotoxins  # Transaminitis # Hyperbilirubinemia Cholestasis of sepsis vs cholangitis - trend LFTs - ABX as above  # Hypokalemia # Hypomagnesemia - correct electrolytes  # Thrombocytopenia - CTM   Best Practice (right click and "Reselect all SmartList Selections" daily)   Diet/type: NPO w/ oral meds DVT prophylaxis: SCD GI prophylaxis: N/A Lines: Central line Foley:  Yes, and it is still needed Code Status:  full code Last date of multidisciplinary goals of care discussion [attempted to call boyfriend and mother but could only leave voicemail]  Labs   CBC: Recent Labs  Lab 12/28/21 0350 12/28/21 1017  WBC 5.8  --   NEUTROABS 5.3  --   HGB 13.0  --   HCT 37.8  --   MCV 86.1  --   PLT 158 109*    Basic Metabolic Panel: Recent Labs  Lab 12/28/21 0350 12/28/21 0801  NA 140  --   K 2.3*  --  CL 109  --   CO2 20*  --   GLUCOSE 88  --   BUN 21*  --   CREATININE 1.59*  --   CALCIUM 7.8*  --   MG  --  1.2*   GFR: Estimated Creatinine Clearance: 45.8 mL/min (A) (by C-G formula based on SCr of 1.59 mg/dL (H)). Recent Labs  Lab 12/28/21 0350 12/28/21 0647 12/28/21 0927  WBC 5.8  --   --   LATICACIDVEN 5.0* 4.0* 3.6*    Liver Function Tests: Recent Labs  Lab 12/28/21 0350  AST 42*  ALT 24  ALKPHOS 127*  BILITOT 6.0*   PROT 6.5  ALBUMIN 3.3*   Recent Labs  Lab 12/28/21 0350  LIPASE 25   No results for input(s): "AMMONIA" in the last 168 hours.  ABG    Component Value Date/Time   TCO2 24 02/08/2013 0033     Coagulation Profile: Recent Labs  Lab 12/28/21 0350 12/28/21 1017  INR 2.2* PENDING    Cardiac Enzymes: No results for input(s): "CKTOTAL", "CKMB", "CKMBINDEX", "TROPONINI" in the last 168 hours.  HbA1C: No results found for: "HGBA1C"  CBG: No results for input(s): "GLUCAP" in the last 168 hours.  Review of Systems:   12 point review of systems is negative except as in HPI  Past Medical History:  She,  has a past medical history of Asthma, Headache in pregnancy, Herpes simplex of female genitalia, Migraine, and Migraines.   Surgical History:   Past Surgical History:  Procedure Laterality Date   CESAREAN SECTION N/A 03/27/2017   Procedure: CESAREAN SECTION;  Surgeon: Jerelyn Charles, MD;  Location: [facility];  Service: Obstetrics;  Laterality: N/A;   CHOLECYSTECTOMY N/A 01/17/2015   Procedure: LAPAROSCOPIC CHOLECYSTECTOMY WITH INTRAOPERATIVE CHOLANGIOGRAM;  Surgeon: Excell Seltzer, MD;  Location: [facility];  Service: General;  Laterality: N/A;   COLPOSCOPY  03/2014     Social History:   reports that she has been smoking cigarettes. She has a 0.50 pack-year smoking history. She has never used smokeless tobacco. She reports that she does not currently use alcohol. She reports that she does not currently use drugs after having used the following drugs: Other-see comments. Frequency: 21.00 times per week.   Family History:  Her family history includes Asthma in her mother, sister, and sister.   Allergies Allergies  Allergen Reactions   Shellfish Allergy Shortness Of Breath and Swelling    Tongue   Other     Tylenol #3---Swelling    Penicillins Itching, Swelling and Rash    Sweating, Has patient had a PCN reaction causing immediate rash, facial/tongue/throat  swelling, SOB or lightheadedness with hypotension: NO Has patient had a PCN reaction causing severe rash involving mucus membranes or skin necrosis:NO Has patient had a PCN reaction that required hospitalization NO Has patient had a PCN reaction occurring within the last 10 years:NO If all of the above answers are "NO", then may proceed with Cephalosporin use.       Home Medications  Prior to Admission medications   Medication Sig Start Date End Date Taking? Authorizing Provider  famotidine (PEPCID) 20 MG tablet Take 1 tablet (20 mg total) by mouth 2 (two) times daily. Patient not taking: Reported on 10/05/2019 09/19/19   Robyn Haber, MD  loperamide (IMODIUM) 2 MG capsule Take 1 capsule (2 mg total) by mouth 4 (four) times daily as needed for diarrhea or loose stools. Patient not taking: Reported on 10/05/2019 09/19/19   Robyn Haber, MD  ondansetron (  ZOFRAN-ODT) 8 MG disintegrating tablet Take 1 tablet (8 mg total) by mouth every 8 (eight) hours as needed for nausea. Patient not taking: Reported on 10/05/2019 09/19/19   Robyn Haber, MD  fluticasone [facility]) 50 MCG/ACT nasal spray Place 1 spray into both nostrils daily. 01/08/19 01/12/19  Petrucelli, Glynda Jaeger, PA-C     Critical care time: 45 minutes

## 2021-12-28 NOTE — Progress Notes (Signed)
A consult was received from an ED physician for vancomycin per pharmacy dosing.  The patient's profile has been reviewed for ht/wt/allergies/indication/available labs.   A one time order has been placed for vancomycin 1g.  Further antibiotics/pharmacy consults should be ordered by admitting physician if indicated.                       Thank you, Peggyann Juba, PharmD, BCPS 12/28/2021  4:32 AM

## 2021-12-28 NOTE — Progress Notes (Signed)
Received pt around 10am this morning. Pt has ran a fever of 100.8. Ice was placed. MD aware. Tempt rechecked & tempt 101. MD ordered prn tylenol given. Tempt now 65.8

## 2021-12-28 NOTE — Progress Notes (Addendum)
Pharmacy Antibiotic Note  Katrina [name] is a 31 y.o. female admitted on 12/28/2021 with sepsis.  Pharmacy has been consulted for vancomycin and cefepime dosing; Flagyl per MD. Unable to obtain current weight; using estimated weight per ED RN of 65 kg.  Plan: Vancomycin 1000 mg IV now, then 750 mg IV q24 hr (est AUC 456 based on SCr 1.59; Vd 0.72) Measure vancomycin AUC at steady state as indicated SCr daily x3 given AKI on admission F/u updated ht/wt    Temp (24hrs), Avg:100.6 F (38.1 C), Min:99.9 F (37.7 C), Max:101.3 F (38.5 C)  Recent Labs  Lab 12/28/21 0350 12/28/21 0647  WBC 5.8  --   CREATININE 1.59*  --   LATICACIDVEN 5.0* 4.0*    CrCl cannot be calculated (Unknown ideal weight.).    Allergies  Allergen Reactions   Shellfish Allergy Shortness Of Breath and Swelling    Tongue   Other     Tylenol #3---Swelling    Penicillins Itching, Swelling and Rash    Sweating, Has patient had a PCN reaction causing immediate rash, facial/tongue/throat swelling, SOB or lightheadedness with hypotension: NO Has patient had a PCN reaction causing severe rash involving mucus membranes or skin necrosis:NO Has patient had a PCN reaction that required hospitalization NO Has patient had a PCN reaction occurring within the last 10 years:NO If all of the above answers are "NO", then may proceed with Cephalosporin use.      Antimicrobials this admission: 7/1 vancomycin >>  7/1 cefepime >>  7/1 Flagyl >>   Dose adjustments this admission: N/a  Microbiology results: 7/1 BCx: sent 7/1 UCx: sent    Thank you for allowing pharmacy to be a part of this patient's care.  Louine Tenpenny A 12/28/2021 7:58 AM

## 2021-12-28 NOTE — Progress Notes (Signed)
Pt being followed by [facility] for Sepsis protocol. 

## 2021-12-28 NOTE — ED Provider Notes (Signed)
.  Critical Care  Performed by: Maudie Flakes, MD Authorized by: Maudie Flakes, MD   Critical care provider statement:    Critical care time (minutes):  45   Critical care was necessary to treat or prevent imminent or life-threatening deterioration of the following conditions:  Shock   Critical care was time spent personally by me on the following activities:  Development of treatment plan with patient or surrogate, discussions with consultants, evaluation of patient's response to treatment, examination of patient, ordering and review of laboratory studies, ordering and review of radiographic studies, ordering and performing treatments and interventions, pulse oximetry, re-evaluation of patient's condition and review of old charts .Central Line  Date/Time: 12/28/2021 6:37 AM  Performed by: Maudie Flakes, MD Authorized by: Maudie Flakes, MD   Consent:    Consent obtained:  Emergent situation Universal protocol:    Patient identity confirmed:  Hospital-assigned identification number Pre-procedure details:    Indication(s): central venous access     Hand hygiene: Hand hygiene performed prior to insertion     Sterile barrier technique: All elements of maximal sterile technique followed     Skin preparation:  Chlorhexidine   Skin preparation agent: Skin preparation agent completely dried prior to procedure   Anesthesia:    Anesthesia method:  Local infiltration   Local anesthetic:  Lidocaine 1% w/o epi Procedure details:    Location:  R femoral   Patient position:  Supine   Procedural supplies:  Triple lumen   Catheter size:  7 Fr   Landmarks identified: yes     Ultrasound guidance: yes     Ultrasound guidance timing: real time     Sterile ultrasound techniques: Sterile gel and sterile probe covers were used     Number of attempts:  1   Successful placement: yes   Post-procedure details:    Post-procedure:  Dressing applied and line sutured   Assessment:  Blood return through  all ports and free fluid flow   Procedure completion:  Tolerated well, no immediate complications     Maudie Flakes, MD 12/28/21 [phone]

## 2021-12-28 NOTE — Progress Notes (Signed)
[facility] Progress Note Patient Name: Katrina [name] DOB: 1991-05-03 MRN: [id number]   Date of Service  12/28/2021  HPI/Events of Note  Pt complaining of pain, but refuses to push PCA pump button to get due dilaudid.   eICU Interventions  Spoke with pt, she was saying that she had received morphine before and that helped her a lot. Explained that dilaudid is also an opiate, and that we could try it first before switching medications.  She agreed for now.         [place] 12/28/2021, 11:31 PM

## 2021-12-28 NOTE — Progress Notes (Signed)
RT attempted Aline but pt pulled back and flow stopped. Tried again but was not successful. RN and MD aware. Next shift RT aware.

## 2021-12-28 NOTE — ED Provider Notes (Signed)
[facility] DEPT Provider Note   CSN: [id number] Arrival date & time: 12/28/21  0245     History  No chief complaint on file.   Katrina [name] is a 31 y.o. female.  HPI  Patient with medical history including asthma, migraines, presents to the with complaints of stomach pains nausea and vomiting.  Patient states that pain started today, pain is in her epigastric region, does not radiate, is remained constant, she associated nausea and vomiting denies hematemesis or coffee-ground emesis, states she is still passing gas and having normal bowel movements denies melena or hematochezia, she has had a cholecystectomy, she denies alcohol use or NSAID use, she has no other significant abdominal history.  She states that she did heroin today, states that this is recreational, denies any suicidal homicidal rations denies any hallucinations or delusions.  Patient not have any other complaints at this time.    Home Medications Prior to Admission medications   Medication Sig Start Date End Date Taking? Authorizing Provider  famotidine (PEPCID) 20 MG tablet Take 1 tablet (20 mg total) by mouth 2 (two) times daily. Patient not taking: Reported on 10/05/2019 09/19/19   Robyn Haber, MD  loperamide (IMODIUM) 2 MG capsule Take 1 capsule (2 mg total) by mouth 4 (four) times daily as needed for diarrhea or loose stools. Patient not taking: Reported on 10/05/2019 09/19/19   Robyn Haber, MD  ondansetron (ZOFRAN-ODT) 8 MG disintegrating tablet Take 1 tablet (8 mg total) by mouth every 8 (eight) hours as needed for nausea. Patient not taking: Reported on 10/05/2019 09/19/19   Robyn Haber, MD  fluticasone [facility]) 50 MCG/ACT nasal spray Place 1 spray into both nostrils daily. 01/08/19 01/12/19  Petrucelli, Samantha R, PA-C      Allergies    Shellfish allergy, Other, and Penicillins    Review of Systems   Review of Systems  Constitutional:  Negative for chills and fever.   Respiratory:  Negative for shortness of breath.   Cardiovascular:  Negative for chest pain.  Gastrointestinal:  Positive for abdominal pain, nausea and vomiting.  Neurological:  Negative for headaches.  Psychiatric/Behavioral:  Negative for self-injury and suicidal ideas.     Physical Exam Updated Vital Signs BP (!) 85/42   Pulse (!) 116   Temp 99.9 F (37.7 C) (Rectal)   Resp (!) 27   SpO2 100%  Physical Exam Vitals and nursing note reviewed.  Constitutional:      General: She is not in acute distress.    Appearance: She is not ill-appearing.     Comments: Disheveled, covered in emesis and stool  HENT:     Head: Normocephalic and atraumatic.     Ears:     Comments: No deformity of the head presents, no raccoon eyes or battle sign noted    Nose: No congestion.     Mouth/Throat:     Mouth: Mucous membranes are dry.     Pharynx: Oropharynx is clear.     Comments: No trismus no torticollis no oral trauma noted. Eyes:     Extraocular Movements: Extraocular movements intact.     Conjunctiva/sclera: Conjunctivae normal.     Pupils: Pupils are equal, round, and reactive to light.  Cardiovascular:     Rate and Rhythm: Regular rhythm. Tachycardia present.     Pulses: Normal pulses.     Heart sounds: No murmur heard.    No friction rub. No gallop.  Pulmonary:     Effort: No respiratory distress.  Breath sounds: No wheezing, rhonchi or rales.  Abdominal:     Palpations: Abdomen is soft.     Tenderness: There is abdominal tenderness. There is guarding.     Comments: Abdomen nondistended, tender in the epigastric region, with guarding no rebound tenderness or peritoneal sign negative Murphy sign McBurney point.  Musculoskeletal:     Right lower leg: No edema.     Left lower leg: No edema.     Comments: Spine was palpated was nontender to palpation no step-off deformities noted.  All 4 extremities were palpated they are nontender.  Skin:    General: Skin is warm and dry.   Neurological:     Mental Status: She is alert.     Comments: Cranial nerves II through XII grossly intact no difficulty with word finding,  following two-step commands no unilateral weakness present.  Psychiatric:        Mood and Affect: Mood normal.     Comments: Alert and orient x4, slightly somnolent my exam but arousable, denies suicidal homicidal ideations does not appear to be responding to internal stimuli.     ED Results / Procedures / Treatments   Labs (all labs ordered are listed, but only abnormal results are displayed) Labs Reviewed  LACTIC ACID, PLASMA - Abnormal; Notable for the following components:      Result Value   Lactic Acid, Venous 5.0 (*)    All other components within normal limits  COMPREHENSIVE METABOLIC PANEL - Abnormal; Notable for the following components:   Potassium 2.3 (*)    CO2 20 (*)    BUN 21 (*)    Creatinine, Ser 1.59 (*)    Calcium 7.8 (*)    Albumin 3.3 (*)    AST 42 (*)    Alkaline Phosphatase 127 (*)    Total Bilirubin 6.0 (*)    GFR, Estimated 44 (*)    All other components within normal limits  CBC WITH DIFFERENTIAL/PLATELET - Abnormal; Notable for the following components:   nRBC 0.3 (*)    Lymphs Abs 0.3 (*)    Monocytes Absolute 0.0 (*)    Abs Immature Granulocytes 0.14 (*)    All other components within normal limits  PROTIME-INR - Abnormal; Notable for the following components:   Prothrombin Time 24.3 (*)    INR 2.2 (*)    All other components within normal limits  APTT - Abnormal; Notable for the following components:   aPTT 70 (*)    All other components within normal limits  URINALYSIS, ROUTINE W REFLEX MICROSCOPIC - Abnormal; Notable for the following components:   Color, Urine AMBER (*)    APPearance CLOUDY (*)    Hgb urine dipstick SMALL (*)    Protein, ur >=300 (*)    Leukocytes,Ua TRACE (*)    Bacteria, UA MANY (*)    All other components within normal limits  RAPID URINE DRUG SCREEN, HOSP PERFORMED -  Abnormal; Notable for the following components:   Opiates POSITIVE (*)    Amphetamines POSITIVE (*)    All other components within normal limits  CULTURE, BLOOD (ROUTINE X 2)  CULTURE, BLOOD (ROUTINE X 2)  URINE CULTURE  LIPASE, BLOOD  LACTIC ACID, PLASMA  MAGNESIUM  I-STAT BETA HCG BLOOD, ED ([facility], [facility], [facility] ONLY)    EKG EKG Interpretation  Date/Time:  Saturday December 28 2021 03:20:19 EDT Ventricular Rate:  131 PR Interval:  138 QRS Duration: 92 QT Interval:  313 QTC Calculation: 462 R Axis:  48 Text Interpretation: Sinus tachycardia RSR' in V1 or V2, probably normal variant Confirmed by Gerlene Fee [phone]) on 12/28/2021 5:09:53 AM  Radiology CT Head Wo Contrast  Result Date: 12/28/2021 CLINICAL DATA:  Altered mental status EXAM: CT HEAD WITHOUT CONTRAST TECHNIQUE: Contiguous axial images were obtained from the base of the skull through the vertex without intravenous contrast. RADIATION DOSE REDUCTION: This exam was performed according to the departmental dose-optimization program which includes automated exposure control, adjustment of the mA and/or kV according to patient size and/or use of iterative reconstruction technique. COMPARISON:  05/10/2013 FINDINGS: Brain: No evidence of acute infarction, hemorrhage, hydrocephalus, extra-axial collection or mass lesion/mass effect. Vascular: No hyperdense vessel or unexpected calcification. Skull: Normal. Negative for fracture or focal lesion. Sinuses/Orbits: No acute finding. Other: None. IMPRESSION: No acute intracranial abnormality noted. Electronically Signed   By: Inez Catalina M.D.   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 Both lungs are clear. The visualized skeletal structures are unremarkable. IMPRESSION: No active disease.  Electronically Signed   By: Inez Catalina M.D.   On: 12/28/2021 03:41    Procedures .Critical Care  Performed by: Marcello Fennel, PA-C Authorized by: Marcello Fennel, PA-C   Critical care provider statement:    Critical care time (minutes):  60   Critical care time was exclusive of:  Separately billable procedures and treating other patients   Critical care was necessary to treat or prevent imminent or life-threatening deterioration of the following conditions:  Shock and sepsis   Critical care was time spent personally by me on the following activities:  Development of treatment plan with patient or surrogate, discussions with consultants, evaluation of patient's response to treatment, examination of patient, ordering and review of laboratory studies, ordering and review of radiographic studies, ordering and performing treatments and interventions, pulse oximetry, re-evaluation of patient's condition and review of old charts   I assumed direction of critical care for this patient from another provider in my specialty: no     Care discussed with: admitting provider       Medications Ordered in ED Medications  lactated ringers infusion (has no administration in time range)  potassium chloride 10 mEq in 100 mL IVPB (10 mEq Intravenous New Bag/Given 12/28/21 0558)  norepinephrine (LEVOPHED) 1m in 2558m(0.016 mg/mL) premix infusion (10 mcg/min Intravenous Rate/Dose Change 12/28/21 0630)  sodium chloride flush (NS) 0.9 % injection 10-40 mL (has no administration in time range)  sodium chloride flush (NS) 0.9 % injection 10-40 mL (has no administration in time range)  Chlorhexidine Gluconate Cloth 2 % PADS 6 each (has no administration in time range)  0.9 %  sodium chloride infusion (has no administration in time range)  sodium chloride 0.9 % bolus 1,000 mL (0 mLs Intravenous Stopped 12/28/21 0612)  ciprofloxacin (CIPRO) IVPB 400 mg (0 mg Intravenous Stopped 12/28/21 0612)  metroNIDAZOLE  (FLAGYL) IVPB 500 mg (0 mg Intravenous Stopped 12/28/21 0612)  vancomycin (VANCOCIN) IVPB 1000 mg/200 mL premix (0 mg Intravenous Stopped 12/28/21 0612)  potassium chloride SA (KLOR-CON M) CR tablet 40 mEq (40 mEq Oral Given 12/28/21 0547)  iohexol (OMNIPAQUE) 300 MG/ML solution 100 mL (100 mLs Intravenous Contrast Given 12/28/21 0523)  lactated ringers bolus 1,000 mL (1,000 mLs Intravenous Bolus 12/28/21 0610)  lactated ringers bolus  1,000 mL (1,000 mLs Intravenous Bolus 12/28/21 0545)    ED Course/ Medical Decision Making/ A&P                           Medical Decision Making Amount and/or Complexity of Data Reviewed Labs: ordered. Radiology: ordered. ECG/medicine tests: ordered.  Risk OTC drugs. Prescription drug management.   This patient presents to the ED for concern of abdominal pain nausea vomiting, this involves an extensive number of treatment options, and is a complaint that carries with it a high risk of complications and morbidity.  The differential diagnosis includes bowel obstruction, diverticulitis, volvulus, perforated stomach ulcer    Additional history obtained:  Additional history obtained from EMS External records from outside source obtained and reviewed including N/A   Co morbidities that complicate the patient evaluation  Polysubstance dependency  Social Determinants of Health:  N/A    Lab Tests:  I Ordered, and personally interpreted labs.  The pertinent results include: CBC unremarkable, UA shows trace leukocytes many bacteria, i-STAT hCG less than 5 rapid urine drug seen positive for opiates and amphetamines, APTT elevated at 70, prothrombin time 24.3 INR 2.2   Imaging Studies ordered:  I ordered imaging studies including CT head, chest x-ray, CT on pelvis I independently visualized and interpreted imaging which showed CT head as well as a chest x-ray both negative acute findings, CT abdomen pelvis shows signs concerning for pancreatitis, as well as  evidence concerning for cirrhosis. I agree with the radiologist interpretation   Cardiac Monitoring:  The patient was maintained on a cardiac monitor.  I personally viewed and interpreted the cardiac monitored which showed an underlying rhythm of: EKG without signs of ischemia   Medicines ordered and prescription drug management:  I ordered medication including fluids I have reviewed the patients home medicines and have made adjustments as needed  Critical Interventions:  Septic shock-started on broad-spectrum antibiotics, started on pressors, and femoral line has been placed.   Reevaluation:  Presents with abdominal pain, she is notably tachycardic and has an oral temperature of 101, concern for possible sepsis, will order sepsis lab work-up, obtain CT head chest x-ray as well as CT on pelvis for further evaluation.  Reassessment patient's blood pressure continued to drop, she is in the 80s, second liter of LR was ordered will continue to monitor.  Activate code sepsis, started on broad-spectrum antibiotics and continue to monitor  Despite second liter of fluids patient remained hypotensive, will start on Levophed and place central line.  After essentially was placed in receiving Levophed BP has improved, will consult with critical care for admission.  Consultations Obtained:  I requested consultation with the Dr. Tacy Learn of critical care,  and discussed lab and imaging findings as well as pertinent plan - they recommend:  will admit the patient  Test Considered:  N/A  Rule out I have low suspicion for intracranial head bleed or mass CT imaging for this.  Low suspicion for CVA she has no focal deficit present my exam.  I have low suspicion for bowel obstruction, diverticulitis, perforated stomach ulcer, pyelo-, kidney stone CT imaging is negative these findings.  I have low suspicion for AAA or dissection as presentation atypical.  I have low suspicion for cholangitis as she is  no elevation liver enzymes, alk phos, CT imaging is negative these findings.    Dispostion and problem list  After consideration of the diagnostic results and the patients response to treatment,  I feel that the patent would benefit from admission.  Sepsis with organ failure-unclear etiology, possible from pancreatitis with necrosis, she started on broad-spectrum antibiotics, started on pressors has femoral line as well as an art line will need further evaluation. Hypokalemia-likely secondary due to severe nausea and vomiting, receiving IV.               Final Clinical Impression(s) / ED Diagnoses Final diagnoses:  Sepsis with acute organ dysfunction and septic shock, due to unspecified organism, unspecified type [facility])  Hypokalemia    Rx / DC Orders ED Discharge Orders     None         Marcello Fennel, PA-C 12/28/21 6151    Maudie Flakes, MD 12/28/21 [phone]

## 2021-12-28 NOTE — ED Notes (Signed)
Respiratory team called.

## 2021-12-28 NOTE — Consult Note (Signed)
Reason for Consult: Concerns over cholangitis Referring Physician: Critical care team  Katrina [name] is an 31 y.o. female.  HPI: Patient seen and examined and discussed with the critical care team and hospital computer reviewed including previous cholecystectomy and Intra-Op cholangiogram in 2016 and she has not had any GI issues in years and gallbladder problems do not run in the family and she was fine yesterday when she went to bed however woke up with upper abdominal pain with some nausea and fever and diarrhea and presented to the emergency room in shock and we are consulted for assistance  Past Medical History:  Diagnosis Date   Asthma    Headache in pregnancy    Herpes simplex of female genitalia    last outbreak 4 months ago   Migraine    Migraines     Past Surgical History:  Procedure Laterality Date   CESAREAN SECTION N/A 03/27/2017   Procedure: CESAREAN SECTION;  Surgeon: Jerelyn Charles, MD;  Location: [facility];  Service: Obstetrics;  Laterality: N/A;   CHOLECYSTECTOMY N/A 01/17/2015   Procedure: LAPAROSCOPIC CHOLECYSTECTOMY WITH INTRAOPERATIVE CHOLANGIOGRAM;  Surgeon: Excell Seltzer, MD;  Location: [facility];  Service: General;  Laterality: N/A;   COLPOSCOPY  03/2014    Family History  Problem Relation Age of Onset   Asthma Mother    Asthma Sister    Asthma Sister     Social History:  reports that she has been smoking cigarettes. She has a 0.50 pack-year smoking history. She has never used smokeless tobacco. She reports that she does not currently use alcohol. She reports current drug use. Frequency: 21.00 times per week. Drugs: Other-see comments, Amphetamines, and Heroin.  Allergies:  Allergies  Allergen Reactions   Shellfish Allergy Shortness Of Breath and Swelling    Tongue   Other     Tylenol #3---Swelling    Penicillins Itching, Swelling and Rash    Sweating, Has patient had a PCN reaction causing immediate rash, facial/tongue/throat swelling, SOB  or lightheadedness with hypotension: NO Has patient had a PCN reaction causing severe rash involving mucus membranes or skin necrosis:NO Has patient had a PCN reaction that required hospitalization NO Has patient had a PCN reaction occurring within the last 10 years:NO If all of the above answers are "NO", then may proceed with Cephalosporin use.      Medications: I have reviewed the patient's current medications.  Results for orders placed or performed during the hospital encounter of 12/28/21 (from the past 48 hour(s))  Lactic acid, plasma     Status: Abnormal   Collection Time: 12/28/21  3:50 AM  Result Value Ref Range   Lactic Acid, Venous 5.0 (HH) 0.5 - 1.9 mmol/L    Comment: CRITICAL RESULT CALLED TO, READ BACK BY AND VERIFIED WITH: BRATU,D. 12/28/21 '@0507'$  BY SEEL,M. Performed at [facility], [place] [address] [place], [place] [address]   Comprehensive metabolic panel     Status: Abnormal   Collection Time: 12/28/21  3:50 AM  Result Value Ref Range   Sodium 140 135 - 145 mmol/L   Potassium 2.3 (LL) 3.5 - 5.1 mmol/L    Comment: CRITICAL RESULT CALLED TO, READ BACK BY AND VERIFIED WITH: BRATU,D. 12/28/21 '@0510'$  BY SEEL,M.    Chloride 109 98 - 111 mmol/L   CO2 20 (L) 22 - 32 mmol/L   Glucose, Bld 88 70 - 99 mg/dL    Comment: Glucose reference range applies only to samples taken after fasting for at least  8 hours.   BUN 21 (H) 6 - 20 mg/dL   Creatinine, Ser 1.59 (H) 0.44 - 1.00 mg/dL   Calcium 7.8 (L) 8.9 - 10.3 mg/dL   Total Protein 6.5 6.5 - 8.1 g/dL   Albumin 3.3 (L) 3.5 - 5.0 g/dL   AST 42 (H) 15 - 41 U/L   ALT 24 0 - 44 U/L   Alkaline Phosphatase 127 (H) 38 - 126 U/L   Total Bilirubin 6.0 (H) 0.3 - 1.2 mg/dL   GFR, Estimated 44 (L) >60 mL/min    Comment: (NOTE) Calculated using the CKD-EPI Creatinine Equation (2021)    Anion gap 11 5 - 15    Comment: Performed at [facility], [place] [address], [place], [place] [address]  CBC with  Differential     Status: Abnormal   Collection Time: 12/28/21  3:50 AM  Result Value Ref Range   WBC 5.8 4.0 - 10.5 K/uL   RBC 4.39 3.87 - 5.11 MIL/uL   Hemoglobin 13.0 12.0 - 15.0 g/dL   HCT 37.8 36.0 - 46.0 %   MCV 86.1 80.0 - 100.0 fL   MCH 29.6 26.0 - 34.0 pg   MCHC 34.4 30.0 - 36.0 g/dL   RDW 13.8 11.5 - 15.5 %   Platelets 158 150 - 400 K/uL   nRBC 0.3 (H) 0.0 - 0.2 %   Neutrophils Relative % 91 %   Neutro Abs 5.3 1.7 - 7.7 K/uL   Lymphocytes Relative 5 %   Lymphs Abs 0.3 (L) 0.7 - 4.0 K/uL   Monocytes Relative 1 %   Monocytes Absolute 0.0 (L) 0.1 - 1.0 K/uL   Eosinophils Relative 0 %   Eosinophils Absolute 0.0 0.0 - 0.5 K/uL   Basophils Relative 1 %   Basophils Absolute 0.0 0.0 - 0.1 K/uL   Immature Granulocytes 2 %   Abs Immature Granulocytes 0.14 (H) 0.00 - 0.07 K/uL    Comment: Performed at [facility], [place] [address], [place], [place] [address]  Protime-INR     Status: Abnormal   Collection Time: 12/28/21  3:50 AM  Result Value Ref Range   Prothrombin Time 24.3 (H) 11.4 - 15.2 seconds   INR 2.2 (H) 0.8 - 1.2    Comment: (NOTE) INR goal varies based on device and disease states. Performed at [facility], [place] [address] [place], [place] [address]   APTT     Status: Abnormal   Collection Time: 12/28/21  3:50 AM  Result Value Ref Range   aPTT 70 (H) 24 - 36 seconds    Comment:        IF BASELINE aPTT IS ELEVATED, SUGGEST PATIENT RISK ASSESSMENT BE USED TO DETERMINE APPROPRIATE ANTICOAGULANT THERAPY. Performed at [facility], [place] [address], [place], [place] [address]   Blood Culture (routine x 2)     Status: None (Preliminary result)   Collection Time: 12/28/21  3:50 AM   Specimen: BLOOD  Result Value Ref Range   Specimen Description      BLOOD LEFT ANTECUBITAL Performed at [facility], [place] [address] [place], [place] [address]    Special Requests      BOTTLES DRAWN AEROBIC  AND ANAEROBIC Blood Culture adequate volume Performed at [place] [address] [place], [place] [address]    Culture      NO GROWTH < 12 HOURS Performed at [place],  [place] [address]    Report Status PENDING   Blood Culture (routine x 2)     Status: None (Preliminary result)   Collection Time: 12/28/21  3:50 AM   Specimen: BLOOD  Result Value Ref Range   Specimen Description      BLOOD BLOOD RIGHT WRIST Performed at [place] [address], [place], [place] [address]    Special Requests      BOTTLES DRAWN AEROBIC AND ANAEROBIC Blood Culture adequate volume Performed at [place] [address], [place], [place] [address]    Culture      NO GROWTH < 12 HOURS Performed at [place] [address] [place], [place] [address]    Report Status PENDING   Lipase, blood     Status: None   Collection Time: 12/28/21  3:50 AM  Result Value Ref Range   Lipase 25 11 - 51 U/L    Comment: Performed at [facility], [place] [address] [place], [place] [address]  Urinalysis, Routine w reflex microscopic     Status: Abnormal   Collection Time: 12/28/21  4:01 AM  Result Value Ref Range   Color, Urine AMBER (A) YELLOW    Comment: BIOCHEMICALS MAY BE AFFECTED BY COLOR   APPearance CLOUDY (A) CLEAR   Specific Gravity, Urine 1.012 1.005 - 1.030   pH 5.0 5.0 - 8.0   Glucose, UA NEGATIVE NEGATIVE mg/dL   Hgb urine dipstick SMALL (A) NEGATIVE   Bilirubin Urine NEGATIVE NEGATIVE   Ketones, ur NEGATIVE NEGATIVE mg/dL   Protein, ur >=300 (A) NEGATIVE mg/dL   Nitrite NEGATIVE NEGATIVE   Leukocytes,Ua TRACE (A) NEGATIVE   RBC / HPF 0-5 0 - 5 RBC/hpf   WBC, UA 0-5 0 - 5 WBC/hpf   Bacteria, UA MANY (A) NONE SEEN   Squamous Epithelial / LPF 6-10 0 - 5   Mucus PRESENT    Granular Casts, UA PRESENT     Comment: Performed at [facility], [place]  [address], [place], [place] [address]  I-Stat beta hCG blood, ED     Status: None   Collection Time: 12/28/21  4:01 AM  Result Value Ref Range   I-stat hCG, quantitative <5.0 <5 mIU/mL   Comment 3            Comment:   GEST. AGE      CONC.  (mIU/mL)   <=1 WEEK        5 - 50     2 WEEKS       50 - 500     3 WEEKS       100 - 10,000     4 WEEKS     1,000 - 30,000        FEMALE AND NON-PREGNANT FEMALE:     LESS THAN 5 mIU/mL   Urine rapid drug screen (hosp performed)     Status: Abnormal   Collection Time: 12/28/21  4:01 AM  Result Value Ref Range   Opiates POSITIVE (A) NONE DETECTED   Cocaine NONE DETECTED NONE DETECTED   Benzodiazepines NONE DETECTED NONE DETECTED   Amphetamines POSITIVE (A) NONE DETECTED   Tetrahydrocannabinol NONE DETECTED NONE DETECTED   Barbiturates NONE DETECTED NONE DETECTED    Comment: (NOTE) DRUG SCREEN FOR MEDICAL PURPOSES ONLY.  IF CONFIRMATION IS NEEDED FOR ANY PURPOSE, NOTIFY LAB WITHIN 5 DAYS.  LOWEST DETECTABLE LIMITS FOR URINE DRUG SCREEN Drug Class  Cutoff (ng/mL) Amphetamine and metabolites    1000 Barbiturate and metabolites    200 Benzodiazepine                 366 Tricyclics and metabolites     300 Opiates and metabolites        300 Cocaine and metabolites        300 THC                            50 Performed at [facility], [place] [address] [place], [place] [address]   Lactic acid, plasma     Status: Abnormal   Collection Time: 12/28/21  6:47 AM  Result Value Ref Range   Lactic Acid, Venous 4.0 (HH) 0.5 - 1.9 mmol/L    Comment: CRITICAL VALUE NOTED.  VALUE IS CONSISTENT WITH PREVIOUSLY REPORTED AND CALLED VALUE. Performed at [facility], [place] [address], [place], [place] [address]   Magnesium     Status: Abnormal   Collection Time: 12/28/21  8:01 AM  Result Value Ref Range   Magnesium 1.2 (L) 1.7 - 2.4 mg/dL    Comment: Performed at [facility], [place]  [address], [place], [place] [address]  Lactic acid, plasma     Status: Abnormal   Collection Time: 12/28/21  9:27 AM  Result Value Ref Range   Lactic Acid, Venous 3.6 (HH) 0.5 - 1.9 mmol/L    Comment: CRITICAL VALUE NOTED.  VALUE IS CONSISTENT WITH PREVIOUSLY REPORTED AND CALLED VALUE. Performed at [facility], [place] [address], [place], [place] [address]   MRSA Next Gen by PCR, Nasal     Status: None   Collection Time: 12/28/21 10:05 AM   Specimen: Nasal Mucosa; Nasal Swab  Result Value Ref Range   MRSA by PCR Next Gen NOT DETECTED NOT DETECTED    Comment: (NOTE) The GeneXpert MRSA Assay (FDA approved for NASAL specimens only), is one component of a comprehensive MRSA colonization surveillance program. It is not intended to diagnose MRSA infection nor to guide or monitor treatment for MRSA infections. Test performance is not FDA approved in patients less than 36 years old. Performed at [facility], [place] [address] [place], [place] [address]   DIC Panel ONCE - STAT     Status: Abnormal   Collection Time: 12/28/21 10:17 AM  Result Value Ref Range   Prothrombin Time 24.8 (H) 11.4 - 15.2 seconds   INR 2.3 (H) 0.8 - 1.2    Comment: (NOTE) INR goal varies based on device and disease states.    aPTT 66 (H) 24 - 36 seconds    Comment:        IF BASELINE aPTT IS ELEVATED, SUGGEST PATIENT RISK ASSESSMENT BE USED TO DETERMINE APPROPRIATE ANTICOAGULANT THERAPY.    Fibrinogen 94 (LL) 210 - 475 mg/dL    Comment: REPEATED TO VERIFY CRITICAL RESULT CALLED TO, READ BACK BY AND VERIFIED WITH: GARCIA,K RN ON 12/28/21 @ 1202 BY GOLSONM (NOTE) Fibrinogen results may be underestimated in patients receiving thrombolytic therapy.    D-Dimer, Quant >20.00 (H) 0.00 - 0.50 ug/mL-FEU    Comment: (NOTE) At the manufacturer cut-off value of 0.5 g/mL FEU, this assay has a negative predictive value of 95-100%.This assay is intended for use in conjunction with a  clinical pretest probability (PTP) assessment model to exclude pulmonary embolism (PE) and deep venous thrombosis (DVT) in outpatients suspected of  PE or DVT. Results should be correlated with clinical presentation.    Platelets 109 (L) 150 - 400 K/uL   Smear Review NO SCHISTOCYTES SEEN     Comment: Performed at [facility], [place] [address], [place], [place] [address]  Comprehensive metabolic panel     Status: Abnormal   Collection Time: 12/28/21 12:30 PM  Result Value Ref Range   Sodium 140 135 - 145 mmol/L   Potassium 2.9 (L) 3.5 - 5.1 mmol/L    Comment: DELTA CHECK NOTED   Chloride 112 (H) 98 - 111 mmol/L   CO2 19 (L) 22 - 32 mmol/L   Glucose, Bld 130 (H) 70 - 99 mg/dL    Comment: Glucose reference range applies only to samples taken after fasting for at least 8 hours.   BUN 24 (H) 6 - 20 mg/dL   Creatinine, Ser 1.76 (H) 0.44 - 1.00 mg/dL   Calcium 7.1 (L) 8.9 - 10.3 mg/dL   Total Protein 4.8 (L) 6.5 - 8.1 g/dL   Albumin 2.3 (L) 3.5 - 5.0 g/dL   AST 35 15 - 41 U/L   ALT 22 0 - 44 U/L   Alkaline Phosphatase 69 38 - 126 U/L   Total Bilirubin 4.3 (H) 0.3 - 1.2 mg/dL   GFR, Estimated 39 (L) >60 mL/min    Comment: (NOTE) Calculated using the CKD-EPI Creatinine Equation (2021)    Anion gap 9 5 - 15    Comment: Performed at [facility], [place] [address], [place], [place] [address]  CK     Status: None   Collection Time: 12/28/21 12:30 PM  Result Value Ref Range   Total CK 134 38 - 234 U/L    Comment: Performed at [facility], [place] [address] [place], [place] [address]    CT ABDOMEN PELVIS W CONTRAST  Result Date: 12/28/2021 CLINICAL DATA:  31 year old female with history of acute onset of nonlocalized abdominal pain. Patient was found on the side of the road covered in feces and vomit. EXAM: CT ABDOMEN AND PELVIS WITH CONTRAST TECHNIQUE: Multidetector CT imaging of the abdomen and pelvis was performed using the standard  protocol following bolus administration of intravenous contrast. RADIATION DOSE REDUCTION: This exam was performed according to the departmental dose-optimization program which includes automated exposure control, adjustment of the mA and/or kV according to patient size and/or use of iterative reconstruction technique. CONTRAST:  175m OMNIPAQUE IOHEXOL 300 MG/ML  SOLN COMPARISON:  CT the abdomen and pelvis 08/13/2016. FINDINGS: Lower chest: Unremarkable. Hepatobiliary: Slight nodular contour of the liver, suggests early changes of cirrhosis. Ill-defined area of low attenuation adjacent to the falciform ligament in segment 4B of the liver, similar to the prior examination, most compatible with a benign perfusion anomaly and/or focal fatty infiltration. No other suspicious appearing hepatic lesions. Diffuse periportal edema is noted. Status post cholecystectomy. No intra or extrahepatic biliary ductal dilatation. Pancreas: No pancreatic mass. No pancreatic ductal dilatation. Small amount of fluid adjacent to the distal body and tail of the pancreas. No well-defined peripancreatic fluid collections to suggest pseudocyst. Spleen: Spleen is enlarged measuring 10.1 x 5.4 x 17.7 cm (estimated splenic volume of 483 mL). Adrenals/Urinary Tract: Bilateral kidneys and adrenal glands are normal in appearance. No hydroureteronephrosis. Urinary bladder is unremarkable in appearance. Stomach/Bowel: The appearance of the stomach is unremarkable. No pathologic dilatation of small bowel or colon. Normal appendix. Vascular/Lymphatic: No significant atherosclerotic disease, aneurysm or dissection noted in the abdominal or pelvic vasculature.  No lymphadenopathy noted in the abdomen or pelvis. Reproductive: Uterus and ovaries are grossly unremarkable in appearance. Other: Ill-defined fluid attenuation in the retroperitoneum most adjacent to the distal body and tail of the pancreas, and extending into the left perinephric region. No  significant volume of ascites. No pneumoperitoneum. Musculoskeletal: There are no aggressive appearing lytic or blastic lesions noted in the visualized portions of the skeleton. IMPRESSION: 1. Fluid attenuation adjacent to the distal body and tail of the pancreas extending into the adjacent portions of the retroperitoneum, concerning for potential pancreatitis. 2. Diffuse periportal edema in the liver. This is nonspecific, but correlation with liver function tests is recommended. 3. Morphologic changes in the liver suggesting early cirrhosis. 4. Splenomegaly, which could be a sign of developing portal hypertension. 5. Additional incidental findings, as above. Electronically Signed   By: Vinnie Langton M.D.   On: 12/28/2021 06:12   CT Head Wo Contrast  Result Date: 12/28/2021 CLINICAL DATA:  Altered mental status EXAM: CT HEAD WITHOUT CONTRAST TECHNIQUE: Contiguous axial images were obtained from the base of the skull through the vertex without intravenous contrast. RADIATION DOSE REDUCTION: This exam was performed according to the departmental dose-optimization program which includes automated exposure control, adjustment of the mA and/or kV according to patient size and/or use of iterative reconstruction technique. COMPARISON:  05/10/2013 FINDINGS: Brain: No evidence of acute infarction, hemorrhage, hydrocephalus, extra-axial collection or mass lesion/mass effect. Vascular: No hyperdense vessel or unexpected calcification. Skull: Normal. Negative for fracture or focal lesion. Sinuses/Orbits: No acute finding. Other: None. IMPRESSION: No acute intracranial abnormality noted. Electronically Signed   By: Inez Catalina M.D.   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 Both lungs are clear. The visualized  skeletal structures are unremarkable. IMPRESSION: No active disease. Electronically Signed   By: Inez Catalina M.D.   On: 12/28/2021 03:41    ROS negative except above Blood pressure 98/69, pulse (!) 122, temperature 98.6 F (37 C), temperature source Oral, resp. rate (!) 37, height '5\' 1"'$  (1.549 m), weight 72.3 kg, SpO2 96 %. Physical Exam patient stable and answering questions appropriately no acute distress abdomen to me is actually soft and I am not making the pain any worse with palpation nor is there any rebound CT reviewed Labs reviewed transaminases normal bili increased INR BUN and creatinine increased white count normal increase lactic acid bili increased lipase normal previous Intra-Op cholangiogram reviewed without abnormality  Assessment/Plan: Sepsis in a patient with IV drug abuse concerns over questionable bile duct etiology Plan: When she is stable would proceed with a MRI and MRCP of the bile ducts to rule out CBD stones and in the meantime consider vitamin K or FFP if she begins to bleed for her coagulopathy and DIC and also consider fractionating her bilirubin and continue antibiotics and supportive care and await cultures per critical care doctor and call me sooner if GI question or problem otherwise we will check on tomorrow  [facility] E 12/28/2021, 3:39 PM

## 2021-12-28 NOTE — ED Triage Notes (Signed)
Pt BIB EMS. Pt was found on the side of the road covered in feces and vomit. Pt took heroin but does not know the time. Pt answers all questions w/ "I'm not sure" and "I cant".  Initial Vitals  150 hr 70/30  Last set of vitals 96/37 130  182 cbg 16 rr 100% room air  C/o abdominal pain  Normal saline 89m and '4mg'$  of zofran given

## 2021-12-28 NOTE — Procedures (Signed)
Arterial Catheter Insertion Procedure Note  MARCILLE BARMAN  [id number]  1990-11-06  Date:12/28/21  Time:12:47 PM    Provider Performing: Maryjane Hurter    Procedure: Insertion of Arterial Line [phone]) with US guidance ([id number])   Indication(s) Blood pressure monitoring and/or need for frequent ABGs  Consent Risks of the procedure as well as the alternatives and risks of each were explained to the patient and/or caregiver.  Consent for the procedure was obtained and is signed in the bedside chart  Anesthesia 1% lidocaine   Time Out Verified patient identification, verified procedure, site/side was marked, verified correct patient position, special equipment/implants available, medications/allergies/relevant history reviewed, required imaging and test results available.   Sterile Technique Maximal sterile technique including full sterile barrier drape, hand hygiene, sterile gown, sterile gloves, mask, hair covering, sterile ultrasound probe cover (if used).   Procedure Description Area of catheter insertion was cleaned with chlorhexidine and draped in sterile fashion. With real-time ultrasound guidance an arterial catheter was placed into the right femoral artery.  Appropriate arterial tracings confirmed on monitor.        Complications/Tolerance None; patient tolerated the procedure well.   EBL Minimal   Specimen(s) None

## 2021-12-29 ENCOUNTER — Inpatient Hospital Stay (HOSPITAL_COMMUNITY): Payer: Self-pay

## 2021-12-29 DIAGNOSIS — R0602 Shortness of breath: Secondary | ICD-10-CM

## 2021-12-29 LAB — COMPREHENSIVE METABOLIC PANEL
ALT: 23 U/L (ref 0–44)
AST: 29 U/L (ref 15–41)
Albumin: 2.5 g/dL — ABNORMAL LOW (ref 3.5–5.0)
Alkaline Phosphatase: 78 U/L (ref 38–126)
Anion gap: 9 (ref 5–15)
BUN: 24 mg/dL — ABNORMAL HIGH (ref 6–20)
CO2: 17 mmol/L — ABNORMAL LOW (ref 22–32)
Calcium: 7.3 mg/dL — ABNORMAL LOW (ref 8.9–10.3)
Chloride: 117 mmol/L — ABNORMAL HIGH (ref 98–111)
Creatinine, Ser: 1.18 mg/dL — ABNORMAL HIGH (ref 0.44–1.00)
GFR, Estimated: >60 mL/min (ref >60)
Glucose, Bld: 84 mg/dL (ref 70–99)
Potassium: 3.4 mmol/L — ABNORMAL LOW (ref 3.5–5.1)
Sodium: 143 mmol/L (ref 135–145)
Total Bilirubin: 5.2 mg/dL — ABNORMAL HIGH (ref 0.3–1.2)
Total Protein: 5.4 g/dL — ABNORMAL LOW (ref 6.5–8.1)

## 2021-12-29 LAB — ECHOCARDIOGRAM COMPLETE
Area-P 1/2: 5.54 cm2
Calc EF: 60.8 %
Height: 61 in
S' Lateral: 3.1 cm
Single Plane A2C EF: 60.1 %
Single Plane A4C EF: 63 %
Weight: 2550.28 oz

## 2021-12-29 LAB — BLOOD CULTURE ID PANEL (REFLEXED) - BCID2

## 2021-12-29 LAB — CBC WITH DIFFERENTIAL/PLATELET
Abs Immature Granulocytes: 2.2 10*3/uL — ABNORMAL HIGH (ref 0.00–0.07)
Basophils Absolute: 0.2 10*3/uL — ABNORMAL HIGH (ref 0.0–0.1)
Basophils Relative: 1 %
Eosinophils Absolute: 0 10*3/uL (ref 0.0–0.5)
Eosinophils Relative: 0 %
HCT: 28.4 % — ABNORMAL LOW (ref 36.0–46.0)
Hemoglobin: 10.2 g/dL — ABNORMAL LOW (ref 12.0–15.0)
Immature Granulocytes: 7 %
Lymphocytes Relative: 2 %
Lymphs Abs: 0.7 10*3/uL (ref 0.7–4.0)
MCH: 30.2 pg (ref 26.0–34.0)
MCHC: 35.9 g/dL (ref 30.0–36.0)
MCV: 84 fL (ref 80.0–100.0)
Monocytes Absolute: 1.2 10*3/uL — ABNORMAL HIGH (ref 0.1–1.0)
Monocytes Relative: 4 %
Neutro Abs: 28 10*3/uL — ABNORMAL HIGH (ref 1.7–7.7)
Neutrophils Relative %: 86 %
Platelets: 106 10*3/uL — ABNORMAL LOW (ref 150–400)
RBC: 3.38 MIL/uL — ABNORMAL LOW (ref 3.87–5.11)
RDW: 13.8 % (ref 11.5–15.5)
WBC: 32.2 10*3/uL — ABNORMAL HIGH (ref 4.0–10.5)
nRBC: 0 % (ref 0.0–0.2)

## 2021-12-29 LAB — MAGNESIUM
Magnesium: 1.1 mg/dL — ABNORMAL LOW (ref 1.7–2.4)
Magnesium: 2.4 mg/dL (ref 1.7–2.4)

## 2021-12-29 LAB — PROTIME-INR
INR: 2.2 — ABNORMAL HIGH (ref 0.8–1.2)
Prothrombin Time: 24.6 seconds — ABNORMAL HIGH (ref 11.4–15.2)

## 2021-12-29 MED ORDER — IOHEXOL 350 MG/ML SOLN
100.0000 mL | Freq: Once | INTRAVENOUS | Status: AC | PRN
  Administered 2021-12-29: 100 mL via INTRAVENOUS

## 2021-12-29 MED ORDER — VANCOMYCIN HCL IN DEXTROSE 1-5 GM/200ML-% IV SOLN
1000.0000 mg | INTRAVENOUS | Status: DC
  Administered 2021-12-29: 1000 mg via INTRAVENOUS
  Filled 2021-12-29: qty 200

## 2021-12-29 MED ORDER — MAGNESIUM SULFATE 4 GM/100ML IV SOLN
4.0000 g | Freq: Once | INTRAVENOUS | Status: AC
  Administered 2021-12-29: 4 g via INTRAVENOUS
  Filled 2021-12-29: qty 100

## 2021-12-29 MED ORDER — POTASSIUM CHLORIDE 10 MEQ/50ML IV SOLN
10.0000 meq | INTRAVENOUS | Status: AC
  Administered 2021-12-29 (×4): 10 meq via INTRAVENOUS
  Filled 2021-12-29 (×4): qty 50

## 2021-12-29 MED ORDER — LACTATED RINGERS IV BOLUS
500.0000 mL | Freq: Once | INTRAVENOUS | Status: AC
  Administered 2021-12-29: 500 mL via INTRAVENOUS

## 2021-12-29 MED ORDER — SODIUM CHLORIDE (PF) 0.9 % IJ SOLN
INTRAMUSCULAR | Status: AC
  Filled 2021-12-29: qty 50

## 2021-12-29 MED ORDER — SODIUM CHLORIDE 0.9 % IV SOLN
2.0000 g | Freq: Three times a day (TID) | INTRAVENOUS | Status: DC
  Administered 2021-12-29 – 2021-12-31 (×6): 2 g via INTRAVENOUS
  Filled 2021-12-29 (×6): qty 12.5

## 2021-12-29 MED ORDER — METOCLOPRAMIDE HCL 5 MG/ML IJ SOLN
5.0000 mg | Freq: Four times a day (QID) | INTRAMUSCULAR | Status: DC | PRN
Start: 2021-12-29 — End: 2022-01-02
  Administered 2021-12-29 – 2022-01-01 (×9): 5 mg via INTRAVENOUS
  Filled 2021-12-29 (×9): qty 2

## 2021-12-29 MED ORDER — MAGNESIUM SULFATE 2 GM/50ML IV SOLN
2.0000 g | Freq: Once | INTRAVENOUS | Status: AC
  Administered 2021-12-29: 2 g via INTRAVENOUS
  Filled 2021-12-29: qty 50

## 2021-12-29 NOTE — Progress Notes (Signed)
PCCM Brief Progress Note  Patient and I agree that risk of missed diagnosis of bowel ischemia outweighs theoretical risk of kidney injury. CTA abdomen/pelvis ordered.   [place]

## 2021-12-29 NOTE — Progress Notes (Signed)
NAME:  Katrina [name], MRN:  [id number], DOB:  Oct 25, 1990, LOS: 1 ADMISSION DATE:  12/28/2021, CONSULTATION DATE:  12/28/21 REFERRING MD:  Sedonia Small, CHIEF COMPLAINT:  abdominal pain   History of Present Illness:  31yF with history of asthma, HSV, migraine, heroin use, cholecystectomy who presented to [facility] ED this morning for nausea, vomiting and epigastric pain. After she used heroin, woke up feeling hot, nauseated. She says it overall feels just like it did when she required cholecystectomy in 2016.    Here she was found to be in shock and received 4L total crystalloid boluses, started on LR MIVF, levo at 14 mcg/min, vanc/cefepime/flagyl with concern for sepsis and biliary source vs bacteremia vs pancreatitis. Femoral central line placed in ED.   Otherwise pertinent review of systems is negative.  Pertinent  Medical History  IVDU Cholecystectomy  Asthma HSV Migraines  Significant Hospital Events: Including procedures, antibiotic start and stop dates in addition to other pertinent events   12/28/21 admitted started on ABX, aggressive crystalloid resuscitation  Interim History / Subjective:   Looks less uncomfortable today although she insists her abdominal pain is worse today.   Objective   Blood pressure (!) 99/18, pulse (!) 108, temperature 99.4 F (37.4 C), temperature source Oral, resp. rate (!) 22, height '5\' 1"'$  (1.549 m), weight 72.3 kg, SpO2 98 %.    FiO2 (%):  [26 %] 26 %   Intake/Output Summary (Last 24 hours) at 12/29/2021 0746 Last data filed at 12/29/2021 0175 Gross per 24 hour  Intake 5949.66 ml  Output 400 ml  Net 5549.66 ml   Filed Weights   12/28/21 0906 12/29/21 0500  Weight: 72.3 kg 72.3 kg    Examination: General appearance: 31 y.o., female, less agitated today Eyes: PERRL, tracking appropriately HENT: NCAT; dry MM Neck: Trachea midline; no lymphadenopathy, no JVD Lungs: CTAB, no crackles, no wheeze, with normal respiratory effort CV: tachy RR, no murmur   Abdomen: Soft, non-distended, still tender diffusely but especially over RUQ/epigastrium. I think less so today though.  Extremities: some track marks, warm Neuro: drowsy but attends to conversation, no focal deficit   Resolved Hospital Problem list     Assessment & Plan:  # Shock # Polymicrobial bacteremia Hypovolemic and likely septic shock with bacteremia as source vs less likely cholangitis vs distributive shock from pancreatitis (though lipase still WNL) or bowel ischemia. US liver with hypoechoic lesion that could represent abscess alternatively and does have R pleural effusion which could be infected. - f/u CTA abdomen/pelvis - vanc, cefepime, flagyl follow cultures and narrow as able - check TTE, repeat BCx - give another 500cc LR, reassess  - wean levo for MAP 65 - GI consulted appreciate assistance  # Lactic acidosis - follow trend  # Leukocytosis Manifestation of sepsis/possible developing foci of infection in setting BSI but also got steroids in ED - CTM   # AKI - ctm response to fluid resuscitation - avoid nephrotoxins  # Transaminitis # Hyperbilirubinemia Cholestasis of sepsis vs less likely cholangitis, liver abscess - trend LFTs - ABX as above  # Hypokalemia # Hypomagnesemia - correct electrolytes  # Thrombocytopenia - CTM  # Pain and chronic opioid use: - dilaudid PCA encouraged - pain dose ketamine infusion    Best Practice (right click and "Reselect all SmartList Selections" daily)   Diet/type: NPO w/ oral meds DVT prophylaxis: SCD GI prophylaxis: N/A Lines: Central line Foley:  Yes, and it is still needed Code Status:  full code Last date  of multidisciplinary goals of care discussion Aletha Halim update mother and boyfriend later today]  Critical care time: 40 minutes

## 2021-12-29 NOTE — Progress Notes (Signed)
PHARMACY - PHYSICIAN COMMUNICATION CRITICAL VALUE ALERT - BLOOD CULTURE IDENTIFICATION (BCID)  Katrina [name] is an 31 y.o. female who presented to [facility] on 12/28/2021 with a chief complaint of N/V  & epigastric p ain  Assessment:  1 of 4 bottles, aerobic, staphylococcus epidermidis & staphylococcus lugdunesis both methicillin resistant  Name of physician (or Provider) Contacted: Leighton Parody, RN @ [facility] E link  Current antibiotics: vancomycin, cefepime, flagyl  Changes to prescribed antibiotics recommended:  Patient is on recommended antibiotics - No changes needed  Results for orders placed or performed during the hospital encounter of 12/28/21  Blood Culture ID Panel (Reflexed) (Collected: 12/28/2021  3:50 AM)  Result Value Ref Range   Enterococcus faecalis NOT DETECTED NOT DETECTED   Enterococcus Faecium NOT DETECTED NOT DETECTED   Listeria monocytogenes NOT DETECTED NOT DETECTED   Staphylococcus species DETECTED (A) NOT DETECTED   Staphylococcus aureus (BCID) NOT DETECTED NOT DETECTED   Staphylococcus epidermidis DETECTED (A) NOT DETECTED   Staphylococcus lugdunensis DETECTED (A) NOT DETECTED   Streptococcus species NOT DETECTED NOT DETECTED   Streptococcus agalactiae NOT DETECTED NOT DETECTED   Streptococcus pneumoniae NOT DETECTED NOT DETECTED   Streptococcus pyogenes NOT DETECTED NOT DETECTED   A.calcoaceticus-baumannii NOT DETECTED NOT DETECTED   Bacteroides fragilis NOT DETECTED NOT DETECTED   Enterobacterales NOT DETECTED NOT DETECTED   Enterobacter cloacae complex NOT DETECTED NOT DETECTED   Escherichia coli NOT DETECTED NOT DETECTED   Klebsiella aerogenes NOT DETECTED NOT DETECTED   Klebsiella oxytoca NOT DETECTED NOT DETECTED   Klebsiella pneumoniae NOT DETECTED NOT DETECTED   Proteus species NOT DETECTED NOT DETECTED   Salmonella species NOT DETECTED NOT DETECTED   Serratia marcescens NOT DETECTED NOT DETECTED   Haemophilus influenzae NOT DETECTED NOT DETECTED    Neisseria meningitidis NOT DETECTED NOT DETECTED   Pseudomonas aeruginosa NOT DETECTED NOT DETECTED   Stenotrophomonas maltophilia NOT DETECTED NOT DETECTED   Candida albicans NOT DETECTED NOT DETECTED   Candida auris NOT DETECTED NOT DETECTED   Candida glabrata NOT DETECTED NOT DETECTED   Candida krusei NOT DETECTED NOT DETECTED   Candida parapsilosis NOT DETECTED NOT DETECTED   Candida tropicalis NOT DETECTED NOT DETECTED   Cryptococcus neoformans/gattii NOT DETECTED NOT DETECTED   Methicillin resistance mecA/C DETECTED (A) NOT [place], Pharm.D 12/29/2021 2:57 AM

## 2021-12-29 NOTE — Progress Notes (Signed)
Pharmacy Antibiotic Note  [name] is a 31 y.o. female admitted on 12/28/2021 with sepsis.  Pharmacy has been consulted for vancomycin and cefepime dosing; Flagyl per MD. Unable to obtain current weight; using estimated weight per ED RN of 65 kg.  Today, 12/29/2021: Afebrile since midnight WBC markedly up today from WNL; only got hydrocortisone x 1 SCr improved; not yet to baseline Multiple staph spp in 1/4 BCx bottles CT abd negative for pancreatitis or bowel ischemia E coli in UCx but UA appears contaminated  Plan: Increase vancomycin to 1000 mg IV q24 hr (est AUC 455 based on SCr 1.18; Vd 0.65 w/ borderline BMI) Measure vancomycin AUC at steady state as indicated SCr daily x3 given AKI on admission Increase Cefepime to 2g IV q8 hr Flagyl per MD; dosing appropriate  Height: '5\' 1"'$  (154.9 cm) Weight: 72.3 kg (159 lb 6.3 oz) IBW/kg (Calculated) : 47.8  Temp (24hrs), Avg:98.8 F (37.1 C), Min:98.2 F (36.8 C), Max:99.4 F (37.4 C)  Recent Labs  Lab 12/28/21 0350 12/28/21 0647 12/28/21 0927 12/28/21 1230 12/28/21 1848 12/29/21 0332  WBC 5.8  --   --   --   --  32.2*  CREATININE 1.59*  --   --  1.76*  --  1.18*  LATICACIDVEN 5.0* 4.0* 3.6*  --  2.7*  --      Estimated Creatinine Clearance: 61.7 mL/min (A) (by C-G formula based on SCr of 1.18 mg/dL (H)).    Allergies  Allergen Reactions   Shellfish Allergy Shortness Of Breath and Swelling    Tongue   Other     Tylenol #3---Swelling    Penicillins Itching, Swelling and Rash    Sweating, Has patient had a PCN reaction causing immediate rash, facial/tongue/throat swelling, SOB or lightheadedness with hypotension: NO Has patient had a PCN reaction causing severe rash involving mucus membranes or skin necrosis:NO Has patient had a PCN reaction that required hospitalization NO Has patient had a PCN reaction occurring within the last 10 years:NO If all of the above answers are "NO", then may proceed with Cephalosporin  use.      Antimicrobials this admission: 7/1 vancomycin >>  7/1 cefepime >>  7/1 Flagyl >>   Dose adjustments this admission: 7/2 incr vanc 750 >> 1000 mg q24; cefepime 2q12 >> q8 w/ improved CrCl  Microbiology results: 7/1 BCx: 1/4 MRSE + staph lugdunensis 7/1 UCx: 100 k E col (UA appears contaminated)    Thank you for allowing pharmacy to be a part of this patient's care.  Carrol Hougland A 12/29/2021 1:35 PM

## 2021-12-29 NOTE — Progress Notes (Signed)
  Echocardiogram 2D Echocardiogram has been performed.  Katrina [name] 12/29/2021, 10:02 AM

## 2021-12-29 NOTE — Progress Notes (Signed)
Katrina [name] 8:57 AM  Subjective: Patient seen and examined earlier this morning and discussed with ICU team and she actually looks more stable is more calm still having abdominal pain without vomiting did move her bowels yesterday without blood had a fever yesterday but not today and no new complaints  Objective: Vital signs stable still on some pressors although decreased dose no acute distress this morning abdomen again to me is soft with minimal tenderness no rebound rare bowel sounds labs and ultrasound reviewed BUN and creatinine improved transaminases still normal bili slightly up at least some is indirect white count increased significantly lactic acid decreased 1 out of 4 blood cultures positive for probable skin bacteria but not usual bile duct causes yesterday's ultrasounds reviewed platelet count decreased   Assessment: Sepsis questionable etiology  Plan: I think her presentation is probably more of bowel ischemia with in my opinion her pain out of proportion to physical exam and again I do not feel cholangitis is playing a role and agree with CTA or MRA per ICU team and will continue to follow and if positive for intestinal ischemia would question septic emboli versus hypotensive causes  [facility] E  office [phone] After 5PM or if no answer call [phone]

## 2021-12-29 NOTE — Progress Notes (Addendum)
[facility] ADULT ICU REPLACEMENT PROTOCOL   The patient does apply for the [facility] Adult ICU Electrolyte Replacment Protocol based on the criteria listed below:   1.Exclusion criteria: TCTS patients, ECMO patients, and Dialysis patients 2. Is GFR >/= 30 ml/min? Yes.    Patient's GFR today is >60 3. Is SCr </= 2? Yes.   Patient's SCr is 1.18 mg/dL 4. Did SCr increase >/= 0.5 in 24 hours? No. 5.Pt's weight >40kg  Yes.   6. Abnormal electrolyte(s): >60  7. Electrolytes replaced per protocol K, Mag 8.  Call MD STAT for K+ </= 2.5, Phos </= 1, or Mag </= 1 Physician:  Dani Gobble E Marijke Guadiana 12/29/2021 5:26 AM

## 2021-12-30 DIAGNOSIS — B957 Other staphylococcus as the cause of diseases classified elsewhere: Secondary | ICD-10-CM

## 2021-12-30 DIAGNOSIS — D696 Thrombocytopenia, unspecified: Secondary | ICD-10-CM

## 2021-12-30 DIAGNOSIS — R7881 Bacteremia: Secondary | ICD-10-CM

## 2021-12-30 DIAGNOSIS — E8809 Other disorders of plasma-protein metabolism, not elsewhere classified: Secondary | ICD-10-CM

## 2021-12-30 DIAGNOSIS — F1990 Other psychoactive substance use, unspecified, uncomplicated: Secondary | ICD-10-CM

## 2021-12-30 DIAGNOSIS — D65 Disseminated intravascular coagulation [defibrination syndrome]: Secondary | ICD-10-CM

## 2021-12-30 DIAGNOSIS — G894 Chronic pain syndrome: Secondary | ICD-10-CM

## 2021-12-30 DIAGNOSIS — B958 Unspecified staphylococcus as the cause of diseases classified elsewhere: Secondary | ICD-10-CM

## 2021-12-30 DIAGNOSIS — R162 Hepatomegaly with splenomegaly, not elsewhere classified: Secondary | ICD-10-CM

## 2021-12-30 LAB — COMPREHENSIVE METABOLIC PANEL
ALT: 22 U/L (ref 0–44)
AST: 26 U/L (ref 15–41)
Albumin: 2.5 g/dL — ABNORMAL LOW (ref 3.5–5.0)
Alkaline Phosphatase: 95 U/L (ref 38–126)
Anion gap: 6 (ref 5–15)
BUN: 16 mg/dL (ref 6–20)
CO2: 22 mmol/L (ref 22–32)
Calcium: 7.6 mg/dL — ABNORMAL LOW (ref 8.9–10.3)
Chloride: 113 mmol/L — ABNORMAL HIGH (ref 98–111)
Creatinine, Ser: 0.65 mg/dL (ref 0.44–1.00)
GFR, Estimated: >60 mL/min (ref >60)
Glucose, Bld: 86 mg/dL (ref 70–99)
Potassium: 3.3 mmol/L — ABNORMAL LOW (ref 3.5–5.1)
Sodium: 141 mmol/L (ref 135–145)
Total Bilirubin: 5.2 mg/dL — ABNORMAL HIGH (ref 0.3–1.2)
Total Protein: 5.5 g/dL — ABNORMAL LOW (ref 6.5–8.1)

## 2021-12-30 LAB — URINE CULTURE: Culture: >=100000 — AB

## 2021-12-30 LAB — CBC WITH DIFFERENTIAL/PLATELET
Abs Immature Granulocytes: 1.45 10*3/uL — ABNORMAL HIGH (ref 0.00–0.07)
Basophils Absolute: 0 10*3/uL (ref 0.0–0.1)
Basophils Relative: 0 %
Eosinophils Absolute: 0 10*3/uL (ref 0.0–0.5)
Eosinophils Relative: 0 %
HCT: 26.6 % — ABNORMAL LOW (ref 36.0–46.0)
Hemoglobin: 9.2 g/dL — ABNORMAL LOW (ref 12.0–15.0)
Immature Granulocytes: 9 %
Lymphocytes Relative: 6 %
Lymphs Abs: 0.9 10*3/uL (ref 0.7–4.0)
MCH: 29.5 pg (ref 26.0–34.0)
MCHC: 34.6 g/dL (ref 30.0–36.0)
MCV: 85.3 fL (ref 80.0–100.0)
Monocytes Absolute: 0.7 10*3/uL (ref 0.1–1.0)
Monocytes Relative: 4 %
Neutro Abs: 12.9 10*3/uL — ABNORMAL HIGH (ref 1.7–7.7)
Neutrophils Relative %: 81 %
Platelets: 75 10*3/uL — ABNORMAL LOW (ref 150–400)
RBC: 3.12 MIL/uL — ABNORMAL LOW (ref 3.87–5.11)
RDW: 14.6 % (ref 11.5–15.5)
WBC: 15.9 10*3/uL — ABNORMAL HIGH (ref 4.0–10.5)
nRBC: 0 % (ref 0.0–0.2)

## 2021-12-30 LAB — PROTIME-INR
INR: 1.4 — ABNORMAL HIGH (ref 0.8–1.2)
Prothrombin Time: 16.8 seconds — ABNORMAL HIGH (ref 11.4–15.2)

## 2021-12-30 LAB — HAPTOGLOBIN: Haptoglobin: 39 mg/dL (ref 33–278)

## 2021-12-30 MED ORDER — ENSURE ENLIVE PO LIQD
237.0000 mL | Freq: Two times a day (BID) | ORAL | Status: DC
  Administered 2021-12-31 – 2022-01-01 (×2): 237 mL via ORAL

## 2021-12-30 MED ORDER — VANCOMYCIN HCL 1500 MG/300ML IV SOLN
1500.0000 mg | INTRAVENOUS | Status: DC
  Administered 2021-12-30: 1500 mg via INTRAVENOUS
  Filled 2021-12-30 (×2): qty 300

## 2021-12-30 MED ORDER — ACYCLOVIR 5 % EX OINT
TOPICAL_OINTMENT | CUTANEOUS | Status: DC
  Administered 2021-12-31 – 2022-01-01 (×6): 1 via TOPICAL
  Filled 2021-12-30: qty 15

## 2021-12-30 MED ORDER — ADULT MULTIVITAMIN W/MINERALS CH
1.0000 | ORAL_TABLET | Freq: Every day | ORAL | Status: DC
  Administered 2022-01-01: 1 via ORAL
  Filled 2021-12-30: qty 1

## 2021-12-30 MED ORDER — POTASSIUM CHLORIDE 10 MEQ/50ML IV SOLN
10.0000 meq | INTRAVENOUS | Status: AC
  Administered 2021-12-30 (×6): 10 meq via INTRAVENOUS
  Filled 2021-12-30 (×6): qty 50

## 2021-12-30 NOTE — Progress Notes (Signed)
[facility] ADULT ICU REPLACEMENT PROTOCOL   The patient does apply for the [facility] Adult ICU Electrolyte Replacment Protocol based on the criteria listed below:   1.Exclusion criteria: TCTS patients, ECMO patients, and Dialysis patients 2. Is GFR >/= 30 ml/min? Yes.    Patient's GFR today is >60 3. Is SCr </= 2? Yes.   Patient's SCr is 0.65 mg/dL 4. Did SCr increase >/= 0.5 in 24 hours? No. 5.Pt's weight >40kg  Yes.   6. Abnormal electrolyte(s): K  7. Electrolytes replaced per protocol 8.  Call MD STAT for K+ </= 2.5, Phos </= 1, or Mag </= 1 Physician:  Sherlene Shams [facility] 12/30/2021 5:31 AM

## 2021-12-30 NOTE — Progress Notes (Addendum)
NAME:  Katrina [name], MRN:  [id number], DOB:  01-15-1991, LOS: 2 ADMISSION DATE:  12/28/2021, CONSULTATION DATE:  12/28/21 REFERRING MD:  Sedonia Small, CHIEF COMPLAINT:  abdominal pain   History of Present Illness:  31yF with history of asthma, HSV, migraine, heroin use, cholecystectomy who presented to [facility] ED this morning for nausea, vomiting and epigastric pain. After she used heroin, woke up feeling hot, nauseated. She says it overall feels just like it did when she required cholecystectomy in 2016.    Here she was found to be in shock and received 4L total crystalloid boluses, started on LR MIVF, levo at 14 mcg/min, vanc/cefepime/flagyl with concern for sepsis and biliary source vs bacteremia vs pancreatitis. Femoral central line placed in ED.   Otherwise pertinent review of systems is negative.  Pertinent  Medical History  IVDU Cholecystectomy  Asthma HSV Migraines  Significant Hospital Events: Including procedures, antibiotic start and stop dates in addition to other pertinent events   12/28/21 admitted started on ABX, aggressive crystalloid resuscitation 7/3 weaned off NE overnight. Cont on ket gtt and dilaudid pca   Interim History / Subjective:   CTA a/p without evidence of bowel ischemia   C/o 10/10 pain  Objective   Blood pressure (!) 99/18, pulse 83, temperature 98.8 F (37.1 C), temperature source Oral, resp. rate (!) 30, height '5\' 1"'$  (1.549 m), weight 70.2 kg, SpO2 100 %.    FiO2 (%):  [27 %-30 %] 30 %   Intake/Output Summary (Last 24 hours) at 12/30/2021 1116 Last data filed at 12/30/2021 0700 Gross per 24 hour  Intake 1985.35 ml  Output 3200 ml  Net -1214.65 ml   Filed Weights   12/28/21 0906 12/29/21 0500 12/30/21 0400  Weight: 72.3 kg 72.3 kg 70.2 kg    Examination: General: WDWN young adult asleep NAD  HEENT: NCAT pink mm anicteric sclera. EtCO2 in place  Lungs: Even unlabored on RA. CTAb CV: regular rhythm, regular rate. Cap refill brisk  Abdomen: Soft  non-distended, normoactive x4. Epigastric pain  Extremities: no acute joint deformity. No cyanosis or clubbing Skin: scattered track marks. C/d/w Neuro: awakens to voice, very drowsy.following commands. No focal deficit   Resolved Hospital Problem list    AKI Transaminitis   Problems    Shock -- mixed septic shock and hypovolemic shock  -- improved Severe sepsis due to Polymicrobial bacteremia, methicillin resistant staphs and Ecoli UTI P -cont vanc cefepime, dc flagyl -follow Bcx - Follow white count and fever curve -cont IVF -MAP goal 65 -Will reach out to ID -- ? If there might be utility for TEE   Abdominal pain -was speculation of bowel ischemia with seemingly out of proportion pain to physical exam, imaging was fortunately without evidence of ischemia  -wonder if this is more reflective of changes to opiate use while admitted // pain in this setting P -as below, dilaudid PCA + ket gtt  -serial abdominal exams  -when taking POs, would try some GI cocktail w lido and see if that helps with the pain some. Right now too drowsy   Hyperbilirubinemia -probably Cholestasis of sepsis  P -PRN LFTs, bili -cont abx as above   Hypokalemia Hyponatremia Hypomagnesemia P - correct electrolytes  Thrombocytopenia P -trend CBC  -SCDs  Substance use disorder IVDU Opiate dependence Chronic pain P -dilaudid PCA + ket gtt. Hopefully work toward weaning ket gtt  -TOC when appropriate    Best Practice (right click and "Reselect all SmartList Selections" daily)   Diet/type:  NPO w/ oral meds DVT prophylaxis: SCD GI prophylaxis: N/A Lines: Central line Foley:  Yes, and it is still needed Code Status:  full code Last date of multidisciplinary goals of care discussion --    CRITICAL CARE Performed by: Cristal Generous   Total critical care time: 40 minutes  Critical care time was exclusive of separately billable procedures and treating other patients. Critical care  was necessary to treat or prevent imminent or life-threatening deterioration.  Critical care was time spent personally by me on the following activities: development of treatment plan with patient and/or surrogate as well as nursing, discussions with consultants, evaluation of patient's response to treatment, examination of patient, obtaining history from patient or surrogate, ordering and performing treatments and interventions, ordering and review of laboratory studies, ordering and review of radiographic studies, pulse oximetry and re-evaluation of patient's condition.  Eliseo Gum MSN, AGACNP-BC [place] for pager 12/30/2021, 11:16 AM

## 2021-12-30 NOTE — Progress Notes (Addendum)
Pharmacy Antibiotic Note  Katrina [name] is a 31 y.o. female admitted on 12/28/2021 with sepsis. Patient with IVDU, most recent heroin use on day of admission. Patient with significant abdominal pain, imaging without evidence of ischemia. Blood cultures positive 1/4 bottles methicillin resistant staph epi and staph lugdunensis. Pharmacy has been consulted for vancomycin and cefepime dosing.   Today, 12/30/2021: -Scr improved < 1.0 -WBC up to 32 yesterday, now improving -Blood cultures 1/4 bottles methicillin resistant staph epi and staph lugdunensis -Urine culture E.coli -ID to see patient - ? need for TEE given history  Plan: Increase vancomycin to 1500 mg IV q24 hr for improved renal function Continue cefepime 2 g IV q8 hr F/u ID recommendations Vancomycin levels as indicated  Height: '5\' 1"'$  (154.9 cm) Weight: 70.2 kg (154 lb 12.2 oz) IBW/kg (Calculated) : 47.8  Temp (24hrs), Avg:98.7 F (37.1 C), Min:97.8 F (36.6 C), Max:100.1 F (37.8 C)  Recent Labs  Lab 12/28/21 0350 12/28/21 0647 12/28/21 0927 12/28/21 1230 12/28/21 1848 12/29/21 0332 12/30/21 0444  WBC 5.8  --   --   --   --  32.2* 15.9*  CREATININE 1.59*  --   --  1.76*  --  1.18* 0.65  LATICACIDVEN 5.0* 4.0* 3.6*  --  2.7*  --   --      Estimated Creatinine Clearance: 89.7 mL/min (by C-G formula based on SCr of 0.65 mg/dL).    Allergies  Allergen Reactions   Shellfish Allergy Shortness Of Breath and Swelling    Tongue   Other     Tylenol #3---Swelling    Penicillins Itching, Swelling and Rash    Sweating, Has patient had a PCN reaction causing immediate rash, facial/tongue/throat swelling, SOB or lightheadedness with hypotension: NO Has patient had a PCN reaction causing severe rash involving mucus membranes or skin necrosis:NO Has patient had a PCN reaction that required hospitalization NO Has patient had a PCN reaction occurring within the last 10 years:NO If all of the above answers are "NO", then may  proceed with Cephalosporin use.      Antimicrobials this admission: 7/1 vancomycin >>  7/1 cefepime >>  7/1 Flagyl >> 7/2  Dose adjustments this admission: 7/2 incr vanc 750 >> 1000 mg q24; cefepime 2q12 >> q8 w/ improved CrCl  Microbiology results: 7/2 BCx: pending 7/1 BCx: 1/4 MRSE + staph lugdunensis 7/1 UCx: 100 k E.coli (pan sensitive)   Thank you for allowing pharmacy to be a part of this patient's care.  Tawnya Crook, PharmD, BCPS Clinical Pharmacist 12/30/2021 1:02 PM

## 2021-12-30 NOTE — TOC Initial Note (Signed)
Transition of Care [facility]) - Initial/Assessment Note    Patient Details  Name: Katrina [name] MRN: [id number] Date of Birth: 1991/03/14  Transition [facility]) CM/SW Contact:    Leeroy Cha, RN Phone Number: 12/30/2021, 7:34 AM  Clinical Narrative:                  Transition of Care [facility]) Screening Note   Patient Details  Name: Katrina [name] Date of Birth: Nov 03, 1990   Transition of Care [facility]) CM/SW Contact:    Leeroy Cha, RN Phone Number: 12/30/2021, 7:34 AM    Transition of Care Department [facility]) has reviewed patient and no TOC needs have been identified at this time. We will continue to monitor patient advancement through interdisciplinary progression rounds. If new patient transition needs arise, please place a TOC consult.    Expected Discharge Plan: Home/Self Care Barriers to Discharge: Continued Medical Work up   Patient Goals and CMS Choice Patient states their goals for this hospitalization and ongoing recovery are:: to go home CMS Medicare.gov Compare Post Acute Care list provided to:: Patient    Expected Discharge Plan and Services Expected Discharge Plan: Home/Self Care   Discharge Planning Services: CM Consult   Living arrangements for the past 2 months: Apartment                                      Prior Living Arrangements/Services Living arrangements for the past 2 months: Apartment Lives with:: Self Patient language and need for interpreter reviewed:: Yes Do you feel safe going back to the place where you live?: Yes            Criminal Activity/Legal Involvement Pertinent to Current Situation/Hospitalization: No - Comment as needed  Activities of Daily Living Home Assistive Devices/Equipment: None ADL Screening (condition at time of admission) Patient's cognitive ability adequate to safely complete daily activities?: Yes Is the patient deaf or have difficulty hearing?: No Does the patient have difficulty seeing,  even when wearing glasses/contacts?: No Does the patient have difficulty concentrating, remembering, or making decisions?: No Patient able to express need for assistance with ADLs?: Yes Does the patient have difficulty dressing or bathing?: No Independently performs ADLs?: Yes (appropriate for developmental age) Does the patient have difficulty walking or climbing stairs?: No Weakness of Legs: None Weakness of Arms/Hands: None  Permission Sought/Granted                  Emotional Assessment Appearance:: Appears stated age     Orientation: : Oriented to Place, Oriented to Self, Oriented to  Time, Oriented to Situation Alcohol / Substance Use: Not Applicable Psych Involvement: No (comment)  Admission diagnosis:  Hypokalemia [E87.6] Septic shock ([place]) [A41.9, R65.21] Sepsis with acute organ dysfunction and septic shock, due to unspecified organism, unspecified type ([place]) [A41.9, R65.21] Patient Active Problem List   Diagnosis Date Noted   Septic shock ([place]) 12/28/2021   Opiate dependence ([place]) 03/26/2017   No prenatal care in current pregnancy in third trimester 03/25/2017   Right knee pain 04/15/5101   Biliary colic 58/52/7782   Recurrent biliary colic 42/35/3614   Cholestasis of pregnancy in third trimester 01/03/2015   [redacted] weeks gestation of pregnancy    Gestational hypertension    Pregnancy 12/19/2014   Migraine without aura and without status migrainosus, not intractable 08/21/2014   Pregnancy complication 43/15/4008   Low-lying placenta  Preterm premature rupture of membranes with onset of labor more than 24 hours following rupture in second trimester    Vaginal bleeding in pregnancy    [redacted] weeks gestation of pregnancy    Amniotic fluid leaking    Pregnant 05/26/2014   Smoker 05/26/2014   Breast lump on right side at 10 o'clock position 03/21/2014   Atypical squamous cell changes of undetermined significance (ASCUS) on cervical cytology with positive high risk  human papilloma virus (HPV) 03/21/2014   PCP:  Patient, No Pcp Per Pharmacy:   [place] ([place]), [place] - [address] [place] [address] Phone: [phone] Fax: [phone]     Social Determinants of Health (SDOH) Interventions    Readmission Risk Interventions     No data to display

## 2021-12-30 NOTE — Consult Note (Addendum)
[facility]                                                                                        Patient Identification: Patient Name: Katrina [name] MRN: [id number] [place] Date: 12/28/2021  2:46 AM Today's Date: 12/30/2021 Reason for consult: Bacteremia  Requesting provider: Deetta Perla  Principal Problem:   Septic shock ([place])   Antibiotics:  Vancomycin 6/30- Ciprofloxacin 6/30 Metronidazole 6/30-7/1 Cefepime 7/1-  Lines/Hardware:  Assessment # Septic shock, resolved - off pressors   # Polymicrobial bacteremia ( Staph lugdunensis, staph epidermidis)- 1/4 bottles, difficult to interpret if this contributed to septic shock vs others   # ? 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 No concerns for mesenteric ischemia in CTA.  # Hyperbilirubinemia with normal Transaminases and ALP- Evaluated by GI, no concerns for cholangitis   # Hepatosplenomegaly/Early cirrhosis/Hypoalbuminemia # Thrombocytopenia and Coagulopathy - no reported h/o alcohol intake   Recommendations  - Continue Vancomycin, pharmacy to dose pending sensitivities ( has been requested). DC cefepime from 7/4 ( would cover for probable UTI ) - Fu repeat blood cultures  - TTE poor study to exclude endocarditis. Recommend TEE given presence of staph lugdunensis, active IVDU and splenomegaly although staph lugdunensis + 1/4 bottles  - Check Hepatitis A, B and C serology given IVDU and hepatosplenomegaly. Less likely to be related to  EBV/CMV, fungal causes.  - Fu LFTs.   Dr Gale Journey covering tomorrow   Rest of the management as per the primary team. Please call with questions or concerns.  Thank you for the consult  Katrina Oz, MD Infectious Disease Physician [facility] [address] [place], [place] [address] Phone: [phone]  Fax: [phone]  __________________________________________________________________________________________________________ HPI and Hospital Course: 31 year old female with a PMH of asthma, migraine, HSV, IV heroin use who presented to the ED on 7/1 with nausea, vomiting and epigastric pain.  She woke up feeling hot and nauseated after she used heroin. At ED found to be in shock and received IVF, vasopressors with concern for sepsis and started on broad-spectrum antibiotics.  7/1 blood cx Staph epidermidis and staph lugdunensis in 1/4 bottles  7/2 TTE with no vegetations or endocarditis but poor study  CT abdomen pelvis 7/1 fluid attenuation adjacent to the distal body and tail of the pancreas extending into the adjacent portions of the retroperitoneum concerning for potential pancreatitis.  Diffuse periportal edema in the liver.  Morphologic changes in the liver suggesting early cirrhosis.  Splenomegaly   ROS: very drowsy, arouses on verbal stimulation but goes to sleeo right back   Past Medical History:  Diagnosis Date   Asthma    Headache in pregnancy    Herpes simplex of female genitalia    last outbreak 4 months ago   Migraine    Migraines    Past Surgical History:  Procedure Laterality Date   CESAREAN SECTION N/A 03/27/2017   Procedure: CESAREAN SECTION;  Surgeon: Jerelyn Charles,  MD;  Location: [facility];  Service: Obstetrics;  Laterality: N/A;   CHOLECYSTECTOMY N/A 01/17/2015   Procedure: LAPAROSCOPIC CHOLECYSTECTOMY WITH INTRAOPERATIVE CHOLANGIOGRAM;  Surgeon: Excell Seltzer, MD;  Location: [facility];  Service: General;  Laterality: N/A;   COLPOSCOPY  03/2014     Scheduled Meds:  Chlorhexidine Gluconate Cloth  6 each Topical Daily   HYDROmorphone   Intravenous Q4H   sodium chloride flush  10-40 mL Intracatheter Q12H   Continuous Infusions:  sodium chloride     sodium  chloride 10 mL/hr at 12/30/21 0700   ceFEPime (MAXIPIME) IV Stopped (12/30/21 0556)   ketamine (KETALAR) 500 mg in sodium chloride 0.9 % 100 mL (5 mg/mL) infusion 0.5 mg/kg/hr (12/30/21 1016)   norepinephrine (LEVOPHED) Adult infusion Stopped (12/30/21 0125)   potassium chloride 10 mEq (12/30/21 1131)   vancomycin     PRN Meds:.Place/Maintain arterial line **AND** sodium chloride, acetaminophen, diphenhydrAMINE **OR** diphenhydrAMINE, docusate sodium, metoCLOPramide (REGLAN) injection, naloxone **AND** sodium chloride flush, ondansetron (ZOFRAN) IV, mouth rinse, polyethylene glycol, sodium chloride flush   Allergies  Allergen Reactions   Shellfish Allergy Shortness Of Breath and Swelling    Tongue   Other     Tylenol #3---Swelling    Penicillins Itching, Swelling and Rash    Sweating, Has patient had a PCN reaction causing immediate rash, facial/tongue/throat swelling, SOB or lightheadedness with hypotension: NO Has patient had a PCN reaction causing severe rash involving mucus membranes or skin necrosis:NO Has patient had a PCN reaction that required hospitalization NO Has patient had a PCN reaction occurring within the last 10 years:NO If all of the above answers are "NO", then may proceed with Cephalosporin use.     Social History   Socioeconomic History   Marital status: Single    Spouse name: Not on file   Number of children: 2   Years of education: 8 th   Highest education level: Not on file  Occupational History    Employer: UNEMPLOYED  Tobacco Use   Smoking status: Every Day    Packs/day: 0.25    Years: 2.00    Total pack years: 0.50    Types: Cigarettes    Last attempt to quit: 05/02/2013    Years since quitting: 8.6   Smokeless tobacco: Never  Substance and Sexual Activity   Alcohol use: Not Currently    Alcohol/week: 0.0 standard drinks of alcohol    Comment: socially   Drug use: Yes    Frequency: 21.0 times per week    Types: Other-see comments,  Amphetamines, Heroin    Comment: percocet; 3 per day per pt.    Sexual activity: Yes    Birth control/protection: None, Surgical  Other Topics Concern   Not on file  Social History Narrative   Patient is single and lives at home with her family.   Patient is unemployed.   Education 8 th grade.   Right handed.   Caffeine one cup of coffee daily.   Social Determinants of Health   Financial Resource Strain: Not on file  Food Insecurity: Not on file  Transportation Needs: Not on file  Physical Activity: Not on file  Stress: Not on file  Social Connections: Not on file  Intimate Partner Violence: Not on file   Family History  Problem Relation Age of Onset   Asthma Mother    Asthma Sister    Asthma Sister       Vitals  BP (!) 99/18 (BP Location: Left Wrist)   Pulse  83   Temp 98.8 F (37.1 C) (Oral)   Resp 20   Ht '5\' 1"'$  (1.549 m)   Wt 70.2 kg   SpO2 100%   BMI 29.24 kg/m   Physical Exam Constitutional:  on ketamine 0.'3mg'$ /kg/hr, levophed off    Comments: asleep, NAD  Cardiovascular:     Rate and Rhythm: Normal rate and regular rhythm.     Heart sounds:   Pulmonary:     Effort: Pulmonary effort is normal.     Comments: Normal breath sounds   Abdominal:     Palpations: Abdomen is soft.     Tenderness: non distended and non tender   Musculoskeletal:        General: No swelling or tenderness.   Skin:    Comments: scattered track marks in extremities   Neurological:     General: drowsy but follows commands    Pertinent Microbiology Results for orders placed or performed during the hospital encounter of 12/28/21  Blood Culture (routine x 2)     Status: None (Preliminary result)   Collection Time: 12/28/21  3:50 AM   Specimen: BLOOD  Result Value Ref Range Status   Specimen Description   Final    BLOOD LEFT ANTECUBITAL Performed at [facility] [address], [place], [place] [address]    Special Requests   Final    BOTTLES DRAWN  AEROBIC AND ANAEROBIC Blood Culture adequate volume Performed at [facility] [address] [place], [place] [address]    Culture   Final    NO GROWTH 1 DAY Performed at [facility], [place] [address], [place], [place] [address]    Report Status PENDING  Incomplete  Blood Culture (routine x 2)     Status: Abnormal (Preliminary result)   Collection Time: 12/28/21  3:50 AM   Specimen: BLOOD RIGHT WRIST  Result Value Ref Range Status   Specimen Description   Final    BLOOD RIGHT WRIST Performed at [facility] [address] [place], [place] [address]    Special Requests   Final    BOTTLES DRAWN AEROBIC AND ANAEROBIC Blood Culture adequate volume Performed at [facility] [address] [place], [place] [address]    Culture  Setup Time   Final    GRAM POSITIVE COCCI AEROBIC BOTTLE ONLY CRITICAL RESULT CALLED TO, READ BACK BY AND VERIFIED WITH: PHARMD MICHELLE BELL 12/29/21'@2'$ :29 BY TW    Culture (A)  Final    STAPHYLOCOCCUS LUGDUNENSIS STAPHYLOCOCCUS EPIDERMIDIS THE SIGNIFICANCE OF ISOLATING THIS ORGANISM FROM A SINGLE VENIPUNCTURE CANNOT BE PREDICTED WITHOUT FURTHER CLINICAL AND CULTURE CORRELATION. SUSCEPTIBILITIES AVAILABLE ONLY ON REQUEST. Performed at [facility], [place] [address], [place], [place] [address]    Report Status PENDING  Incomplete  Blood Culture ID Panel (Reflexed)     Status: Abnormal   Collection Time: 12/28/21  3:50 AM  Result Value Ref Range Status   Enterococcus faecalis NOT DETECTED NOT DETECTED Final   Enterococcus Faecium NOT DETECTED NOT DETECTED Final   Listeria monocytogenes NOT DETECTED NOT DETECTED Final   Staphylococcus species DETECTED (A) NOT DETECTED Final    Comment: CRITICAL RESULT CALLED TO, READ BACK BY AND VERIFIED WITH: PHARMD MICHELLE BELL 12/29/21'@2'$ :47 BY TW    Staphylococcus aureus (BCID) NOT DETECTED NOT DETECTED Final   Staphylococcus epidermidis DETECTED (A) NOT DETECTED  Final    Comment: Methicillin (oxacillin) resistant coagulase negative staphylococcus. Possible blood culture contaminant (unless  isolated from more than one blood culture draw or clinical case suggests pathogenicity). No antibiotic treatment is indicated for blood  culture contaminants. CRITICAL RESULT CALLED TO, READ BACK BY AND VERIFIED WITH: PHARMD MICHELLE BELL 12/29/21'@2'$ :47 BY TW    Staphylococcus lugdunensis DETECTED (A) NOT DETECTED Final    Comment: Methicillin (oxacillin) resistant coagulase negative staphylococcus. Possible blood culture contaminant (unless isolated from more than one blood culture draw or clinical case suggests pathogenicity). No antibiotic treatment is indicated for blood  culture contaminants. CRITICAL RESULT CALLED TO, READ BACK BY AND VERIFIED WITH: PHARMD MICHELLE BELL 12/29/21'@2'$ :47 BY TW    Streptococcus species NOT DETECTED NOT DETECTED Final   Streptococcus agalactiae NOT DETECTED NOT DETECTED Final   Streptococcus pneumoniae NOT DETECTED NOT DETECTED Final   Streptococcus pyogenes NOT DETECTED NOT DETECTED Final   A.calcoaceticus-baumannii NOT DETECTED NOT DETECTED Final   Bacteroides fragilis NOT DETECTED NOT DETECTED Final   Enterobacterales NOT DETECTED NOT DETECTED Final   Enterobacter cloacae complex NOT DETECTED NOT DETECTED Final   Escherichia coli NOT DETECTED NOT DETECTED Final   Klebsiella aerogenes NOT DETECTED NOT DETECTED Final   Klebsiella oxytoca NOT DETECTED NOT DETECTED Final   Klebsiella pneumoniae NOT DETECTED NOT DETECTED Final   Proteus species NOT DETECTED NOT DETECTED Final   Salmonella species NOT DETECTED NOT DETECTED Final   Serratia marcescens NOT DETECTED NOT DETECTED Final   Haemophilus influenzae NOT DETECTED NOT DETECTED Final   Neisseria meningitidis NOT DETECTED NOT DETECTED Final   Pseudomonas aeruginosa NOT DETECTED NOT DETECTED Final   Stenotrophomonas maltophilia NOT DETECTED NOT DETECTED Final   Candida albicans  NOT DETECTED NOT DETECTED Final   Candida auris NOT DETECTED NOT DETECTED Final   Candida glabrata NOT DETECTED NOT DETECTED Final   Candida krusei NOT DETECTED NOT DETECTED Final   Candida parapsilosis NOT DETECTED NOT DETECTED Final   Candida tropicalis NOT DETECTED NOT DETECTED Final   Cryptococcus neoformans/gattii NOT DETECTED NOT DETECTED Final   Methicillin resistance mecA/C DETECTED (A) NOT DETECTED Final    Comment: CRITICAL RESULT CALLED TO, READ BACK BY AND VERIFIED WITH: PHARMD MICHELLE BELL 12/29/21'@2'$ :47 BY TW Performed at [facility], [address] [place], [place] [address]   Urine Culture     Status: Abnormal   Collection Time: 12/28/21  4:01 AM   Specimen: In/Out Cath Urine  Result Value Ref Range Status   Specimen Description   Final    IN/OUT CATH URINE Performed at [facility] [address], [place], [place] [address]    Special Requests   Final    NONE Performed at [facility], [place] [address], [place], [place] [address]    Culture >=100,000 COLONIES/mL ESCHERICHIA COLI (A)  Final   Report Status 12/30/2021 FINAL  Final   Organism ID, Bacteria ESCHERICHIA COLI (A)  Final      Susceptibility   Escherichia coli - MIC*    AMPICILLIN 8 SENSITIVE Sensitive     CEFAZOLIN <=4 SENSITIVE Sensitive     CEFEPIME <=0.12 SENSITIVE Sensitive     CEFTRIAXONE <=0.25 SENSITIVE Sensitive     CIPROFLOXACIN <=0.25 SENSITIVE Sensitive     GENTAMICIN <=1 SENSITIVE Sensitive     IMIPENEM <=0.25 SENSITIVE Sensitive     NITROFURANTOIN <=16 SENSITIVE Sensitive     TRIMETH/SULFA <=20 SENSITIVE Sensitive     AMPICILLIN/SULBACTAM <=2 SENSITIVE Sensitive     PIP/TAZO <=4 SENSITIVE Sensitive     * >=100,000 COLONIES/mL ESCHERICHIA COLI  MRSA Next Gen by PCR, Nasal     Status: None   Collection Time: 12/28/21 10:05 AM   Specimen: Nasal Mucosa; Nasal Swab  Result Value Ref Range Status   MRSA by PCR Next Gen NOT DETECTED NOT DETECTED  Final    Comment: (NOTE) The GeneXpert MRSA Assay (FDA approved for NASAL specimens only), is one component of a comprehensive MRSA colonization surveillance program. It is not intended to diagnose MRSA infection nor to guide or monitor treatment for MRSA infections. Test performance is not FDA approved in patients less than 55 years old. Performed at [facility], [place] [address], [place], [place] [address]     Pertinent Lab seen by me:    Latest Ref Rng & Units 12/30/2021    4:44 AM 12/29/2021    3:32 AM 12/28/2021   10:17 AM  CBC  WBC 4.0 - 10.5 K/uL 15.9  32.2    Hemoglobin 12.0 - 15.0 g/dL 9.2  10.2    Hematocrit 36.0 - 46.0 % 26.6  28.4    Platelets 150 - 400 K/uL 75  106  109       Latest Ref Rng & Units 12/30/2021    4:44 AM 12/29/2021    3:32 AM 12/28/2021    6:48 PM  CMP  Glucose 70 - 99 mg/dL 86  84    BUN 6 - 20 mg/dL 16  24    Creatinine 0.44 - 1.00 mg/dL 0.65  1.18    Sodium 135 - 145 mmol/L 141  143    Potassium 3.5 - 5.1 mmol/L 3.3  3.4    Chloride 98 - 111 mmol/L 113  117    CO2 22 - 32 mmol/L 22  17    Calcium 8.9 - 10.3 mg/dL 7.6  7.3    Total Protein 6.5 - 8.1 g/dL 5.5  5.4  5.1   Total Bilirubin 0.3 - 1.2 mg/dL 5.2  5.2  4.5   Alkaline Phos 38 - 126 U/L 95  78  74   AST 15 - 41 U/L '26  29  31   '$ ALT 0 - 44 U/L '22  23  23      '$ Pertinent Imagings/Other Imagings Plain films and CT images have been personally visualized and interpreted; radiology reports have been reviewed. Decision making incorporated into the Impression / Recommendations.  DG CHEST PORT 1 VIEW  Result Date: 12/29/2021 CLINICAL DATA:  Hypotension.  Sepsis. EXAM: PORTABLE CHEST 1 VIEW COMPARISON:  December 28, 2021 FINDINGS: The heart size and mediastinal contours are within normal limits. Decreased lung volumes are noted. Mild atelectasis is seen within the left lung base. There is no evidence of acute infiltrate, pleural effusion or pneumothorax. The visualized skeletal structures  are unremarkable. IMPRESSION: Low lung volumes with mild left basilar atelectasis. Electronically Signed   By: Virgina Norfolk M.D.   On: 12/29/2021 19:43   CT Angio Abd/Pel w/ and/or w/o  Result Date: 12/29/2021 CLINICAL DATA:  Concern for mesenteric ischemia. Patient presented yesterday with abdominal pain. Found on the side of the road covered in feces and vomit. EXAM: CTA ABDOMEN AND PELVIS WITHOUT AND WITH CONTRAST TECHNIQUE: Multidetector CT imaging of the abdomen and pelvis was performed using the standard protocol during bolus administration of intravenous contrast. Multiplanar reconstructed images and MIPs were obtained and reviewed to evaluate the vascular anatomy. RADIATION DOSE REDUCTION: This exam was performed according to the departmental dose-optimization program which includes automated exposure control, adjustment of the  mA and/or kV according to patient size and/or use of iterative reconstruction technique. CONTRAST:  154m OMNIPAQUE IOHEXOL 350 MG/ML SOLN COMPARISON:  CT abdomen pelvis performed yesterday. FINDINGS: VASCULAR Aorta: Normal caliber aorta without aneurysm, dissection, vasculitis or significant stenosis. Celiac: Patent without evidence of aneurysm, dissection, vasculitis or significant stenosis. SMA: Patent without evidence of aneurysm, dissection, vasculitis or significant stenosis. Renals: Both renal arteries are patent without evidence of aneurysm, dissection, vasculitis, fibromuscular dysplasia or significant stenosis. IMA: Patent without evidence of aneurysm, dissection, vasculitis or significant stenosis. Inflow: Patent without evidence of aneurysm, dissection, vasculitis or significant stenosis. Femoral artery catheter lies in the right external iliac artery. Proximal Outflow: Bilateral common femoral and visualized portions of the superficial and profunda femoral arteries are patent without evidence of aneurysm, dissection, vasculitis or significant stenosis. Veins: No  obvious venous abnormality within the limitations of this arterial phase study. Femoral venous catheter inserted in the right groin, tip in the right common iliac vein. Review of the MIP images confirms the above findings. NON-VASCULAR Lower chest: Small bilateral effusions with associated dependent lower lobe atelectasis, developing since the previous day's CT. Hepatobiliary: Liver mildly enlarged, 22 cm transversely. Focal fat noted along the falciform ligament. Liver otherwise normal in attenuation. No mass or focal lesion. Status post cholecystectomy. No bile duct dilation. Periportal hypoattenuation noted on the previous day's CT has decreased. Pancreas: Pancreas normal attenuation. No mass or convincing inflammation. Fluid noted adjacent to the tail on the previous day's CT is less apparent. Spleen: Enlarged spleen, 17 cm from superior to inferior. No splenic mass or focal lesion. Adrenals/Urinary Tract: Adrenal glands are unremarkable. Kidneys are normal, without renal calculi, focal lesion, or hydronephrosis. Bladder is unremarkable. Stomach/Bowel: Normal stomach. Small bowel and colon are normal in caliber. No wall thickening. No inflammation. Normal appendix visualized. Lymphatic: No enlarged lymph nodes. Reproductive: Uterus and bilateral adnexa are unremarkable. Other: Trace amount of ascites increased from the previous day's study. Musculoskeletal: No acute or significant osseous findings. IMPRESSION: VASCULAR 1. Normal. Normal caliber aorta. No dissection. No atherosclerosis. Branch vessels are all widely patent. There are no findings that would suggest mesenteric ischemia. NON-VASCULAR 1. Mild hepatomegaly.  Splenomegaly. 2. Periportal edema noted on the previous day's exam has decreased. 3. Trace amount of ascites, increased from the prior exam. 4. No current evidence of pancreatitis. 5. Small bilateral effusions with associated dependent atelectasis, developing since the previous day's CT scan. 6.  No evidence of bowel inflammation. Electronically Signed   By: DLajean ManesM.D.   On: 12/29/2021 11:19   ECHOCARDIOGRAM COMPLETE  Result Date: 12/29/2021    ECHOCARDIOGRAM REPORT   Patient Name:   MKARYSSA AMARALDate of Exam: 12/29/2021 Medical Rec #:  [id number]     Height:       61.0 in Accession #:    [id number]    Weight:       159.4 lb Date of Birth:  1January 21, 1992     BSA:          1.715 m Patient Age:    89years       BP:           93/48 mmHg Patient Gender: F              HR:           112 bpm. Exam Location:  Inpatient Procedure: 2D Echo, Cardiac Doppler and Color Doppler Indications:    R06.02 SOB  History:  Patient has no prior history of Echocardiogram examinations.                 Risk Factors:Current Smoker. Shock. IVDU.  Sonographer:    Roseanna Rainbow RDCS Referring Phys: [id number] Hortencia Conradi [facility]  Sonographer Comments: Technically difficult study due to poor echo windows. IMPRESSIONS  1. Left ventricular ejection fraction, by estimation, is 60 to 65%. The left ventricle has normal function. The left ventricle has no regional wall motion abnormalities. Left ventricular diastolic parameters were normal.  2. Right ventricular systolic function is normal. The right ventricular size is normal. There is normal pulmonary artery systolic pressure. The estimated right ventricular systolic pressure is 37.3 mmHg.  3. The mitral valve is grossly normal. Mild mitral valve regurgitation.  4. The aortic valve is tricuspid. Aortic valve regurgitation is not visualized.  5. The inferior vena cava is normal in size with <50% respiratory variability, suggesting right atrial pressure of 8 mmHg. Comparison(s): No prior Echocardiogram. FINDINGS  Left Ventricle: Left ventricular ejection fraction, by estimation, is 60 to 65%. The left ventricle has normal function. The left ventricle has no regional wall motion abnormalities. The left ventricular internal cavity size was normal in size. There is  no left ventricular  hypertrophy. Left ventricular diastolic parameters were normal. Right Ventricle: The right ventricular size is normal. No increase in right ventricular wall thickness. Right ventricular systolic function is normal. There is normal pulmonary artery systolic pressure. The tricuspid regurgitant velocity is 2.42 m/s, and  with an assumed right atrial pressure of 8 mmHg, the estimated right ventricular systolic pressure is 42.8 mmHg. Left Atrium: Left atrial size was normal in size. Right Atrium: Right atrial size was normal in size. Pericardium: There is no evidence of pericardial effusion. Mitral Valve: The mitral valve is grossly normal. Mild mitral valve regurgitation. Tricuspid Valve: The tricuspid valve is grossly normal. Tricuspid valve regurgitation is trivial. Aortic Valve: The aortic valve is tricuspid. There is mild aortic valve annular calcification. Aortic valve regurgitation is not visualized. Pulmonic Valve: The pulmonic valve was grossly normal. Pulmonic valve regurgitation is trivial. Aorta: The aortic root is normal in size and structure. Venous: The inferior vena cava is normal in size with less than 50% respiratory variability, suggesting right atrial pressure of 8 mmHg. IAS/Shunts: No atrial level shunt detected by color flow Doppler.  LEFT VENTRICLE PLAX 2D LVIDd:         4.70 cm     Diastology LVIDs:         3.10 cm     LV e' medial:    14.00 cm/s LV PW:         0.90 cm     LV E/e' medial:  6.5 LV IVS:        0.80 cm     LV e' lateral:   15.60 cm/s LVOT diam:     2.00 cm     LV E/e' lateral: 5.8 LV SV:         63 LV SV Index:   37 LVOT Area:     3.14 cm  LV Volumes (MOD) LV vol d, MOD A2C: 78.8 ml LV vol d, MOD A4C: 84.0 ml LV vol s, MOD A2C: 31.4 ml LV vol s, MOD A4C: 31.1 ml LV SV MOD A2C:     47.3 ml LV SV MOD A4C:     84.0 ml LV SV MOD BP:      53.6 ml RIGHT VENTRICLE  IVC RV S prime:     15.00 cm/s  IVC diam: 2.10 cm TAPSE (M-mode): 2.0 cm LEFT ATRIUM           Index        RIGHT  ATRIUM           Index LA diam:      3.10 cm 1.81 cm/m   RA Area:     10.10 cm LA Vol (A2C): 27.6 ml 16.09 ml/m  RA Volume:   20.70 ml  12.07 ml/m  AORTIC VALVE LVOT Vmax:   128.50 cm/s LVOT Vmean:  90.250 cm/s LVOT VTI:    0.201 m  AORTA Ao Root diam: 2.70 cm Ao Asc diam:  2.40 cm MITRAL VALVE               TRICUSPID VALVE MV Area (PHT): 5.54 cm    TR Peak grad:   23.4 mmHg MV Decel Time: 137 msec    TR Vmax:        242.00 cm/s MV E velocity: 90.55 cm/s MV A velocity: 89.95 cm/s  SHUNTS MV E/A ratio:  1.01        Systemic VTI:  0.20 m                            Systemic Diam: 2.00 cm Rozann Lesches MD Electronically signed by Rozann Lesches MD Signature Date/Time: 12/29/2021/11:11:52 AM    Final    US LIVER DOPPLER  Result Date: 12/28/2021 CLINICAL DATA:  Right upper quadrant pain EXAM: DUPLEX ULTRASOUND OF LIVER TECHNIQUE: Color and duplex Doppler ultrasound was performed to evaluate the hepatic in-flow and out-flow vessels. COMPARISON:  Ultrasound from earlier in the same day. FINDINGS: Liver: Overall decreased echogenicity is noted. The nodularity appreciated on recent CT examination is not borne out on this exam. Area of focal fatty sparing is noted along the falciform ligament. Slight increased echogenicity is noted in the periportal regions which may be related to hepatic inflammatory change. No focal mass is noted. Main Portal Vein size: 1.2 cm Portal Vein Velocities Main Prox:  17.1 cm/sec Main Mid: 20.6 cm/sec Main Dist:  27.8 cm/sec Right: 19.7 cm/sec Left: 26.9 cm/sec Hepatic Vein Velocities Right:  16.4 cm/sec Middle:  22.5 cm/sec Left:  25.2 cm/sec IVC: Present and patent with normal respiratory phasicity. Hepatic Artery Velocity:  54.2 cm/sec Splenic Vein Velocity:  18.3 cm/sec Spleen: 13.5 cm x 6.9 cm x 13.3 cm with a total volume of 653 cm^3 (411 cm^3 is upper limit normal) Portal Vein Occlusion/Thrombus: No Splenic Vein Occlusion/Thrombus: No Ascites: None Varices: None Incidental note is  made of small right pleural effusion. IMPRESSION: Normal waveforms and directions of the hepatic and portal veins. No portal hypertensive changes are seen. Mild splenomegaly similar to that seen on prior CT. Small right pleural effusion. Changes consistent with periportal edema which may be related to underlying inflammatory change. These are similar to that seen on recent CT. Nodularity seen on prior CT is not borne out on today's exam. Electronically Signed   By: Inez Catalina M.D.   On: 12/28/2021 22:07   US Abdomen Limited RUQ (LIVER/GB)  Result Date: 12/28/2021 CLINICAL DATA:  History of prior cholecystectomy, presenting with right upper quadrant tenderness. EXAM: ULTRASOUND ABDOMEN LIMITED RIGHT UPPER QUADRANT COMPARISON:  January 14, 2015 FINDINGS: Gallbladder: The gallbladder is surgically absent. Common bile duct: Diameter: 4.1 mm Liver: A 1.5 cm x 1.5 cm x 2.3 cm  hypoechoic area is seen within the anterior aspect of the left lobe of the liver. No abnormal flow is seen within this region on color Doppler evaluation. Diffusely decreased echogenicity of the liver parenchyma is seen. Portal vein is patent on color Doppler imaging with normal direction of blood flow towards the liver. Other: Of incidental note is the presence of a right-sided pleural effusion. The study is limited secondary to motion artifact as well as the inability of the patient to take a deep breath. IMPRESSION: 1. Findings consistent with history of prior cholecystectomy. 2. Focal fatty infiltration suspected within the anterior aspect of the left lobe of the liver. Electronically Signed   By: Virgina Norfolk M.D.   On: 12/28/2021 21:40   DG CHEST PORT 1 VIEW  Result Date: 12/28/2021 CLINICAL DATA:  Left upper quadrant abdominal pain. EXAM: PORTABLE CHEST 1 VIEW COMPARISON:  One view chest x-ray 12/28/2021 at 3:12 a.m. FINDINGS: The heart size and mediastinal contours are within normal limits. Both lungs are clear. The visualized  skeletal structures are unremarkable. IMPRESSION: No active disease. Electronically Signed   By: San Morelle M.D.   On: 12/28/2021 18:35   CT ABDOMEN PELVIS W CONTRAST  Result Date: 12/28/2021 CLINICAL DATA:  31 year old female with history of acute onset of nonlocalized abdominal pain. Patient was found on the side of the road covered in feces and vomit. EXAM: CT ABDOMEN AND PELVIS WITH CONTRAST TECHNIQUE: Multidetector CT imaging of the abdomen and pelvis was performed using the standard protocol following bolus administration of intravenous contrast. RADIATION DOSE REDUCTION: This exam was performed according to the departmental dose-optimization program which includes automated exposure control, adjustment of the mA and/or kV according to patient size and/or use of iterative reconstruction technique. CONTRAST:  118m OMNIPAQUE IOHEXOL 300 MG/ML  SOLN COMPARISON:  CT the abdomen and pelvis 08/13/2016. FINDINGS: Lower chest: Unremarkable. Hepatobiliary: Slight nodular contour of the liver, suggests early changes of cirrhosis. Ill-defined area of low attenuation adjacent to the falciform ligament in segment 4B of the liver, similar to the prior examination, most compatible with a benign perfusion anomaly and/or focal fatty infiltration. No other suspicious appearing hepatic lesions. Diffuse periportal edema is noted. Status post cholecystectomy. No intra or extrahepatic biliary ductal dilatation. Pancreas: No pancreatic mass. No pancreatic ductal dilatation. Small amount of fluid adjacent to the distal body and tail of the pancreas. No well-defined peripancreatic fluid collections to suggest pseudocyst. Spleen: Spleen is enlarged measuring 10.1 x 5.4 x 17.7 cm (estimated splenic volume of 483 mL). Adrenals/Urinary Tract: Bilateral kidneys and adrenal glands are normal in appearance. No hydroureteronephrosis. Urinary bladder is unremarkable in appearance. Stomach/Bowel: The appearance of the stomach is  unremarkable. No pathologic dilatation of small bowel or colon. Normal appendix. Vascular/Lymphatic: No significant atherosclerotic disease, aneurysm or dissection noted in the abdominal or pelvic vasculature. No lymphadenopathy noted in the abdomen or pelvis. Reproductive: Uterus and ovaries are grossly unremarkable in appearance. Other: Ill-defined fluid attenuation in the retroperitoneum most adjacent to the distal body and tail of the pancreas, and extending into the left perinephric region. No significant volume of ascites. No pneumoperitoneum. Musculoskeletal: There are no aggressive appearing lytic or blastic lesions noted in the visualized portions of the skeleton. IMPRESSION: 1. Fluid attenuation adjacent to the distal body and tail of the pancreas extending into the adjacent portions of the retroperitoneum, concerning for potential pancreatitis. 2. Diffuse periportal edema in the liver. This is nonspecific, but correlation with liver function tests is recommended. 3. Morphologic changes  in the liver suggesting early cirrhosis. 4. Splenomegaly, which could be a sign of developing portal hypertension. 5. Additional incidental findings, as above. Electronically Signed   By: Vinnie Langton M.D.   On: 12/28/2021 06:12   CT Head Wo Contrast  Result Date: 12/28/2021 CLINICAL DATA:  Altered mental status EXAM: CT HEAD WITHOUT CONTRAST TECHNIQUE: Contiguous axial images were obtained from the base of the skull through the vertex without intravenous contrast. RADIATION DOSE REDUCTION: This exam was performed according to the departmental dose-optimization program which includes automated exposure control, adjustment of the mA and/or kV according to patient size and/or use of iterative reconstruction technique. COMPARISON:  05/10/2013 FINDINGS: Brain: No evidence of acute infarction, hemorrhage, hydrocephalus, extra-axial collection or mass lesion/mass effect. Vascular: No hyperdense vessel or unexpected  calcification. Skull: Normal. Negative for fracture or focal lesion. Sinuses/Orbits: No acute finding. Other: None. IMPRESSION: No acute intracranial abnormality noted. Electronically Signed   By: Inez Catalina M.D.   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 Both lungs are clear. The visualized skeletal structures are unremarkable. IMPRESSION: No active disease. Electronically Signed   By: Inez Catalina M.D.   On: 12/28/2021 03:41     I spent more than 80 minutes for this patient encounter including review of prior medical records/discussing diagnostics and treatment plan with the patient/family/coordinate care with primary/other specialits with greater than 50% of time in face to face encounter.   Electronically signed by:   Katrina Oz, MD Infectious Disease Physician [facility] Pager: [phone]

## 2021-12-30 NOTE — Progress Notes (Signed)
Subjective: Patient appears very sleepy, is on IV Dilaudid and IV ketamine.  Objective: Vital signs in last 24 hours: Temp:  [97.8 F (36.6 C)-100.1 F (37.8 C)] 100.1 F (37.8 C) (07/03 1133) Pulse Rate:  [83-118] 91 (07/03 1133) Resp:  [20-42] 20 (07/03 1133) SpO2:  [95 %-100 %] 100 % (07/03 1133) Arterial Line BP: (85-134)/(46-85) 119/72 (07/03 1133) FiO2 (%):  [27 %-30 %] 30 % (07/03 1133) Weight:  [70.2 kg] 70.2 kg (07/03 0400) Weight change: -2.1 kg Last BM Date : 12/28/21  PE: Icteric, mild pallor GENERAL: Lethargic  ABDOMEN: Soft, nondistended, nontender, normoactive bowel sounds EXTREMITIES: No edema  Lab Results: Results for orders placed or performed during the hospital encounter of 12/28/21 (from the past 48 hour(s))  Comprehensive metabolic panel     Status: Abnormal   Collection Time: 12/28/21 12:30 PM  Result Value Ref Range   Sodium 140 135 - 145 mmol/L   Potassium 2.9 (L) 3.5 - 5.1 mmol/L    Comment: DELTA CHECK NOTED   Chloride 112 (H) 98 - 111 mmol/L   CO2 19 (L) 22 - 32 mmol/L   Glucose, Bld 130 (H) 70 - 99 mg/dL    Comment: Glucose reference range applies only to samples taken after fasting for at least 8 hours.   BUN 24 (H) 6 - 20 mg/dL   Creatinine, Ser 1.76 (H) 0.44 - 1.00 mg/dL   Calcium 7.1 (L) 8.9 - 10.3 mg/dL   Total Protein 4.8 (L) 6.5 - 8.1 g/dL   Albumin 2.3 (L) 3.5 - 5.0 g/dL   AST 35 15 - 41 U/L   ALT 22 0 - 44 U/L   Alkaline Phosphatase 69 38 - 126 U/L   Total Bilirubin 4.3 (H) 0.3 - 1.2 mg/dL   GFR, Estimated 39 (L) >60 mL/min    Comment: (NOTE) Calculated using the CKD-EPI Creatinine Equation (2021)    Anion gap 9 5 - 15    Comment: Performed at [facility], [place] [address] [place], [place] [address]  CK     Status: None   Collection Time: 12/28/21 12:30 PM  Result Value Ref Range   Total CK 134 38 - 234 U/L    Comment: Performed at [facility], [place] [address] [place], [place] [address]   Bilirubin, fractionated(tot/dir/indir)     Status: Abnormal   Collection Time: 12/28/21  3:45 PM  Result Value Ref Range   Total Bilirubin 4.2 (H) 0.3 - 1.2 mg/dL   Bilirubin, Direct 2.5 (H) 0.0 - 0.2 mg/dL   Indirect Bilirubin 1.7 (H) 0.3 - 0.9 mg/dL    Comment: Performed at [facility], [place] [address] [place], [place] [address]  Type and screen [place]     Status: None   Collection Time: 12/28/21  3:45 PM  Result Value Ref Range   ABO/RH(D) O NEG    Antibody Screen NEG    Sample Expiration      12/31/2021,2359 Performed at [facility], [place] [address] [place], [place] [address]   Direct antiglobulin test (not at [facility])     Status: None   Collection Time: 12/28/21  6:45 PM  Result Value Ref Range   DAT, complement NEG    DAT, IgG      NEG Performed at [facility], [place] [address], [place], [place] [address]   Reticulocytes     Status: Abnormal   Collection Time: 12/28/21  6:48  PM  Result Value Ref Range   Retic Ct Pct 1.4 0.4 - 3.1 %   RBC. 3.42 (L) 3.87 - 5.11 MIL/uL   Retic Count, Absolute 46.2 19.0 - 186.0 K/uL   Immature Retic Fract 5.7 2.3 - 15.9 %    Comment: Performed at [facility], [place] [address], [place], [place] [address]  Lactate dehydrogenase     Status: Abnormal   Collection Time: 12/28/21  6:48 PM  Result Value Ref Range   LDH 280 (H) 98 - 192 U/L    Comment: Performed at [facility], [place] [address] [place], [place] [address]  Lactic acid, plasma     Status: Abnormal   Collection Time: 12/28/21  6:48 PM  Result Value Ref Range   Lactic Acid, Venous 2.7 (HH) 0.5 - 1.9 mmol/L    Comment: CRITICAL VALUE NOTED.  VALUE IS CONSISTENT WITH PREVIOUSLY REPORTED AND CALLED VALUE. Performed at [facility], [place] [address], [place], [place] [address]   Lipase, blood     Status: None   Collection Time: 12/28/21  6:48 PM   Result Value Ref Range   Lipase 23 11 - 51 U/L    Comment: Performed at [facility], [place] [address], [place], [place] [address]  Hepatic function panel     Status: Abnormal   Collection Time: 12/28/21  6:48 PM  Result Value Ref Range   Total Protein 5.1 (L) 6.5 - 8.1 g/dL   Albumin 2.4 (L) 3.5 - 5.0 g/dL   AST 31 15 - 41 U/L   ALT 23 0 - 44 U/L   Alkaline Phosphatase 74 38 - 126 U/L   Total Bilirubin 4.5 (H) 0.3 - 1.2 mg/dL   Bilirubin, Direct 2.7 (H) 0.0 - 0.2 mg/dL   Indirect Bilirubin 1.8 (H) 0.3 - 0.9 mg/dL    Comment: Performed at [facility], [place] [address], [place], [place] [address]  Comprehensive metabolic panel     Status: Abnormal   Collection Time: 12/29/21  3:32 AM  Result Value Ref Range   Sodium 143 135 - 145 mmol/L   Potassium 3.4 (L) 3.5 - 5.1 mmol/L   Chloride 117 (H) 98 - 111 mmol/L   CO2 17 (L) 22 - 32 mmol/L   Glucose, Bld 84 70 - 99 mg/dL    Comment: Glucose reference range applies only to samples taken after fasting for at least 8 hours.   BUN 24 (H) 6 - 20 mg/dL   Creatinine, Ser 1.18 (H) 0.44 - 1.00 mg/dL   Calcium 7.3 (L) 8.9 - 10.3 mg/dL   Total Protein 5.4 (L) 6.5 - 8.1 g/dL   Albumin 2.5 (L) 3.5 - 5.0 g/dL   AST 29 15 - 41 U/L   ALT 23 0 - 44 U/L   Alkaline Phosphatase 78 38 - 126 U/L   Total Bilirubin 5.2 (H) 0.3 - 1.2 mg/dL   GFR, Estimated >60 >60 mL/min    Comment: (NOTE) Calculated using the CKD-EPI Creatinine Equation (2021)    Anion gap 9 5 - 15    Comment: Performed at [facility], [place] [address] [place],  [address]  CBC with Differential/Platelet     Status: Abnormal   Collection Time: 12/29/21  3:32 AM  Result Value Ref Range   WBC 32.2 (H) 4.0 - 10.5 K/uL   RBC 3.38 (L) 3.87 - 5.11 MIL/uL   Hemoglobin 10.2 (L) 12.0 - 15.0 g/dL  HCT 28.4 (L) 36.0 - 46.0 %   MCV 84.0 80.0 - 100.0 fL   MCH 30.2 26.0 - 34.0 pg   MCHC 35.9 30.0 - 36.0 g/dL   RDW 13.8 11.5 - 15.5 %    Platelets 106 (L) 150 - 400 K/uL   nRBC 0.0 0.0 - 0.2 %   Neutrophils Relative % 86 %   Neutro Abs 28.0 (H) 1.7 - 7.7 K/uL   Lymphocytes Relative 2 %   Lymphs Abs 0.7 0.7 - 4.0 K/uL   Monocytes Relative 4 %   Monocytes Absolute 1.2 (H) 0.1 - 1.0 K/uL   Eosinophils Relative 0 %   Eosinophils Absolute 0.0 0.0 - 0.5 K/uL   Basophils Relative 1 %   Basophils Absolute 0.2 (H) 0.0 - 0.1 K/uL   WBC Morphology MILD LEFT SHIFT (1-5% METAS, OCC MYELO, OCC BANDS)    Immature Granulocytes 7 %   Abs Immature Granulocytes 2.20 (H) 0.00 - 0.07 K/uL    Comment: Performed at [facility], [place] [address], [place], [place] [address]  Protime-INR     Status: Abnormal   Collection Time: 12/29/21  3:32 AM  Result Value Ref Range   Prothrombin Time 24.6 (H) 11.4 - 15.2 seconds   INR 2.2 (H) 0.8 - 1.2    Comment: (NOTE) INR goal varies based on device and disease states. Performed at [facility], [place] [address] [place], [place] [address]   Magnesium     Status: Abnormal   Collection Time: 12/29/21  3:32 AM  Result Value Ref Range   Magnesium 1.1 (L) 1.7 - 2.4 mg/dL    Comment: Performed at [facility], [place] [address], [place], [place] [address]  magnesium level     Status: None   Collection Time: 12/29/21  9:04 PM  Result Value Ref Range   Magnesium 2.4 1.7 - 2.4 mg/dL    Comment: Performed at [facility], [place] [address] [place], [place] [address]  Comprehensive metabolic panel     Status: Abnormal   Collection Time: 12/30/21  4:44 AM  Result Value Ref Range   Sodium 141 135 - 145 mmol/L   Potassium 3.3 (L) 3.5 - 5.1 mmol/L   Chloride 113 (H) 98 - 111 mmol/L   CO2 22 22 - 32 mmol/L   Glucose, Bld 86 70 - 99 mg/dL    Comment: Glucose reference range applies only to samples taken after fasting for at least 8 hours.   BUN 16 6 - 20 mg/dL   Creatinine, Ser 0.65 0.44 - 1.00 mg/dL   Calcium 7.6 (L) 8.9 - 10.3 mg/dL    Total Protein 5.5 (L) 6.5 - 8.1 g/dL   Albumin 2.5 (L) 3.5 - 5.0 g/dL   AST 26 15 - 41 U/L   ALT 22 0 - 44 U/L   Alkaline Phosphatase 95 38 - 126 U/L   Total Bilirubin 5.2 (H) 0.3 - 1.2 mg/dL   GFR, Estimated >60 >60 mL/min    Comment: (NOTE) Calculated using the CKD-EPI Creatinine Equation (2021)    Anion gap 6 5 - 15    Comment: Performed at [facility], [place] [address] [place], [place] [address]  CBC with Differential/Platelet     Status: Abnormal   Collection Time: 12/30/21  4:44 AM  Result Value Ref Range   WBC 15.9 (H) 4.0 - 10.5 K/uL   RBC 3.12 (L) 3.87 - 5.11 MIL/uL   Hemoglobin  9.2 (L) 12.0 - 15.0 g/dL   HCT 26.6 (L) 36.0 - 46.0 %   MCV 85.3 80.0 - 100.0 fL   MCH 29.5 26.0 - 34.0 pg   MCHC 34.6 30.0 - 36.0 g/dL   RDW 14.6 11.5 - 15.5 %   Platelets 75 (L) 150 - 400 K/uL    Comment: SPECIMEN CHECKED FOR CLOTS Immature Platelet Fraction may be clinically indicated, consider ordering this additional test [id number] REPEATED TO VERIFY    nRBC 0.0 0.0 - 0.2 %   Neutrophils Relative % 81 %   Neutro Abs 12.9 (H) 1.7 - 7.7 K/uL   Lymphocytes Relative 6 %   Lymphs Abs 0.9 0.7 - 4.0 K/uL   Monocytes Relative 4 %   Monocytes Absolute 0.7 0.1 - 1.0 K/uL   Eosinophils Relative 0 %   Eosinophils Absolute 0.0 0.0 - 0.5 K/uL   Basophils Relative 0 %   Basophils Absolute 0.0 0.0 - 0.1 K/uL   Immature Granulocytes 9 %   Abs Immature Granulocytes 1.45 (H) 0.00 - 0.07 K/uL    Comment: Performed at [facility], [place] [address] [place], [place] [address]  Protime-INR     Status: Abnormal   Collection Time: 12/30/21  4:44 AM  Result Value Ref Range   Prothrombin Time 16.8 (H) 11.4 - 15.2 seconds   INR 1.4 (H) 0.8 - 1.2    Comment: (NOTE) INR goal varies based on device and disease states. Performed at [facility], [place] [address] [place],  [address]     Studies/Results: DG CHEST PORT 1 VIEW  Result Date:  12/29/2021 CLINICAL DATA:  Hypotension.  Sepsis. EXAM: PORTABLE CHEST 1 VIEW COMPARISON:  December 28, 2021 FINDINGS: The heart size and mediastinal contours are within normal limits. Decreased lung volumes are noted. Mild atelectasis is seen within the left lung base. There is no evidence of acute infiltrate, pleural effusion or pneumothorax. The visualized skeletal structures are unremarkable. IMPRESSION: Low lung volumes with mild left basilar atelectasis. Electronically Signed   By: Virgina Norfolk M.D.   On: 12/29/2021 19:43   CT Angio Abd/Pel w/ and/or w/o  Result Date: 12/29/2021 CLINICAL DATA:  Concern for mesenteric ischemia. Patient presented yesterday with abdominal pain. Found on the side of the road covered in feces and vomit. EXAM: CTA ABDOMEN AND PELVIS WITHOUT AND WITH CONTRAST TECHNIQUE: Multidetector CT imaging of the abdomen and pelvis was performed using the standard protocol during bolus administration of intravenous contrast. Multiplanar reconstructed images and MIPs were obtained and reviewed to evaluate the vascular anatomy. RADIATION DOSE REDUCTION: This exam was performed according to the departmental dose-optimization program which includes automated exposure control, adjustment of the mA and/or kV according to patient size and/or use of iterative reconstruction technique. CONTRAST:  163m OMNIPAQUE IOHEXOL 350 MG/ML SOLN COMPARISON:  CT abdomen pelvis performed yesterday. FINDINGS: VASCULAR Aorta: Normal caliber aorta without aneurysm, dissection, vasculitis or significant stenosis. Celiac: Patent without evidence of aneurysm, dissection, vasculitis or significant stenosis. SMA: Patent without evidence of aneurysm, dissection, vasculitis or significant stenosis. Renals: Both renal arteries are patent without evidence of aneurysm, dissection, vasculitis, fibromuscular dysplasia or significant stenosis. IMA: Patent without evidence of aneurysm, dissection, vasculitis or significant stenosis.  Inflow: Patent without evidence of aneurysm, dissection, vasculitis or significant stenosis. Femoral artery catheter lies in the right external iliac artery. Proximal Outflow: Bilateral common femoral and visualized portions of the superficial and profunda femoral arteries are patent without evidence of aneurysm, dissection, vasculitis  or significant stenosis. Veins: No obvious venous abnormality within the limitations of this arterial phase study. Femoral venous catheter inserted in the right groin, tip in the right common iliac vein. Review of the MIP images confirms the above findings. NON-VASCULAR Lower chest: Small bilateral effusions with associated dependent lower lobe atelectasis, developing since the previous day's CT. Hepatobiliary: Liver mildly enlarged, 22 cm transversely. Focal fat noted along the falciform ligament. Liver otherwise normal in attenuation. No mass or focal lesion. Status post cholecystectomy. No bile duct dilation. Periportal hypoattenuation noted on the previous day's CT has decreased. Pancreas: Pancreas normal attenuation. No mass or convincing inflammation. Fluid noted adjacent to the tail on the previous day's CT is less apparent. Spleen: Enlarged spleen, 17 cm from superior to inferior. No splenic mass or focal lesion. Adrenals/Urinary Tract: Adrenal glands are unremarkable. Kidneys are normal, without renal calculi, focal lesion, or hydronephrosis. Bladder is unremarkable. Stomach/Bowel: Normal stomach. Small bowel and colon are normal in caliber. No wall thickening. No inflammation. Normal appendix visualized. Lymphatic: No enlarged lymph nodes. Reproductive: Uterus and bilateral adnexa are unremarkable. Other: Trace amount of ascites increased from the previous day's study. Musculoskeletal: No acute or significant osseous findings. IMPRESSION: VASCULAR 1. Normal. Normal caliber aorta. No dissection. No atherosclerosis. Branch vessels are all widely patent. There are no findings  that would suggest mesenteric ischemia. NON-VASCULAR 1. Mild hepatomegaly.  Splenomegaly. 2. Periportal edema noted on the previous day's exam has decreased. 3. Trace amount of ascites, increased from the prior exam. 4. No current evidence of pancreatitis. 5. Small bilateral effusions with associated dependent atelectasis, developing since the previous day's CT scan. 6. No evidence of bowel inflammation. Electronically Signed   By: Lajean Manes M.D.   On: 12/29/2021 11:19   ECHOCARDIOGRAM COMPLETE  Result Date: 12/29/2021    ECHOCARDIOGRAM REPORT   Patient Name:   Katrina [name] Date of Exam: 12/29/2021 Medical Rec #:  [id number]      Height:       61.0 in Accession #:    [id number]     Weight:       159.4 lb Date of Birth:  12-18-1990      BSA:          1.715 m Patient Age:    84 years       BP:           93/48 mmHg Patient Gender: F              HR:           112 bpm. Exam Location:  Inpatient Procedure: 2D Echo, Cardiac Doppler and Color Doppler Indications:    R06.02 SOB  History:        Patient has no prior history of Echocardiogram examinations.                 Risk Factors:Current Smoker. Shock. IVDU.  Sonographer:    Roseanna Rainbow RDCS Referring Phys: [id number] Hortencia Conradi [facility]  Sonographer Comments: Technically difficult study due to poor echo windows. IMPRESSIONS  1. Left ventricular ejection fraction, by estimation, is 60 to 65%. The left ventricle has normal function. The left ventricle has no regional wall motion abnormalities. Left ventricular diastolic parameters were normal.  2. Right ventricular systolic function is normal. The right ventricular size is normal. There is normal pulmonary artery systolic pressure. The estimated right ventricular systolic pressure is 79.8 mmHg.  3. The mitral valve is grossly normal. Mild mitral valve regurgitation.  4.  The aortic valve is tricuspid. Aortic valve regurgitation is not visualized.  5. The inferior vena cava is normal in size with <50% respiratory  variability, suggesting right atrial pressure of 8 mmHg. Comparison(s): No prior Echocardiogram. FINDINGS  Left Ventricle: Left ventricular ejection fraction, by estimation, is 60 to 65%. The left ventricle has normal function. The left ventricle has no regional wall motion abnormalities. The left ventricular internal cavity size was normal in size. There is  no left ventricular hypertrophy. Left ventricular diastolic parameters were normal. Right Ventricle: The right ventricular size is normal. No increase in right ventricular wall thickness. Right ventricular systolic function is normal. There is normal pulmonary artery systolic pressure. The tricuspid regurgitant velocity is 2.42 m/s, and  with an assumed right atrial pressure of 8 mmHg, the estimated right ventricular systolic pressure is 11.9 mmHg. Left Atrium: Left atrial size was normal in size. Right Atrium: Right atrial size was normal in size. Pericardium: There is no evidence of pericardial effusion. Mitral Valve: The mitral valve is grossly normal. Mild mitral valve regurgitation. Tricuspid Valve: The tricuspid valve is grossly normal. Tricuspid valve regurgitation is trivial. Aortic Valve: The aortic valve is tricuspid. There is mild aortic valve annular calcification. Aortic valve regurgitation is not visualized. Pulmonic Valve: The pulmonic valve was grossly normal. Pulmonic valve regurgitation is trivial. Aorta: The aortic root is normal in size and structure. Venous: The inferior vena cava is normal in size with less than 50% respiratory variability, suggesting right atrial pressure of 8 mmHg. IAS/Shunts: No atrial level shunt detected by color flow Doppler.  LEFT VENTRICLE PLAX 2D LVIDd:         4.70 cm     Diastology LVIDs:         3.10 cm     LV e' medial:    14.00 cm/s LV PW:         0.90 cm     LV E/e' medial:  6.5 LV IVS:        0.80 cm     LV e' lateral:   15.60 cm/s LVOT diam:     2.00 cm     LV E/e' lateral: 5.8 LV SV:         63 LV SV  Index:   37 LVOT Area:     3.14 cm  LV Volumes (MOD) LV vol d, MOD A2C: 78.8 ml LV vol d, MOD A4C: 84.0 ml LV vol s, MOD A2C: 31.4 ml LV vol s, MOD A4C: 31.1 ml LV SV MOD A2C:     47.3 ml LV SV MOD A4C:     84.0 ml LV SV MOD BP:      53.6 ml RIGHT VENTRICLE             IVC RV S prime:     15.00 cm/s  IVC diam: 2.10 cm TAPSE (M-mode): 2.0 cm LEFT ATRIUM           Index        RIGHT ATRIUM           Index LA diam:      3.10 cm 1.81 cm/m   RA Area:     10.10 cm LA Vol (A2C): 27.6 ml 16.09 ml/m  RA Volume:   20.70 ml  12.07 ml/m  AORTIC VALVE LVOT Vmax:   128.50 cm/s LVOT Vmean:  90.250 cm/s LVOT VTI:    0.201 m  AORTA Ao Root diam: 2.70 cm Ao Asc diam:  2.40 cm  MITRAL VALVE               TRICUSPID VALVE MV Area (PHT): 5.54 cm    TR Peak grad:   23.4 mmHg MV Decel Time: 137 msec    TR Vmax:        242.00 cm/s MV E velocity: 90.55 cm/s MV A velocity: 89.95 cm/s  SHUNTS MV E/A ratio:  1.01        Systemic VTI:  0.20 m                            Systemic Diam: 2.00 cm Rozann Lesches MD Electronically signed by Rozann Lesches MD Signature Date/Time: 12/29/2021/11:11:52 AM    Final    US LIVER DOPPLER  Result Date: 12/28/2021 CLINICAL DATA:  Right upper quadrant pain EXAM: DUPLEX ULTRASOUND OF LIVER TECHNIQUE: Color and duplex Doppler ultrasound was performed to evaluate the hepatic in-flow and out-flow vessels. COMPARISON:  Ultrasound from earlier in the same day. FINDINGS: Liver: Overall decreased echogenicity is noted. The nodularity appreciated on recent CT examination is not borne out on this exam. Area of focal fatty sparing is noted along the falciform ligament. Slight increased echogenicity is noted in the periportal regions which may be related to hepatic inflammatory change. No focal mass is noted. Main Portal Vein size: 1.2 cm Portal Vein Velocities Main Prox:  17.1 cm/sec Main Mid: 20.6 cm/sec Main Dist:  27.8 cm/sec Right: 19.7 cm/sec Left: 26.9 cm/sec Hepatic Vein Velocities Right:  16.4 cm/sec Middle:   22.5 cm/sec Left:  25.2 cm/sec IVC: Present and patent with normal respiratory phasicity. Hepatic Artery Velocity:  54.2 cm/sec Splenic Vein Velocity:  18.3 cm/sec Spleen: 13.5 cm x 6.9 cm x 13.3 cm with a total volume of 653 cm^3 (411 cm^3 is upper limit normal) Portal Vein Occlusion/Thrombus: No Splenic Vein Occlusion/Thrombus: No Ascites: None Varices: None Incidental note is made of small right pleural effusion. IMPRESSION: Normal waveforms and directions of the hepatic and portal veins. No portal hypertensive changes are seen. Mild splenomegaly similar to that seen on prior CT. Small right pleural effusion. Changes consistent with periportal edema which may be related to underlying inflammatory change. These are similar to that seen on recent CT. Nodularity seen on prior CT is not borne out on today's exam. Electronically Signed   By: Inez Catalina M.D.   On: 12/28/2021 22:07   US Abdomen Limited RUQ (LIVER/GB)  Result Date: 12/28/2021 CLINICAL DATA:  History of prior cholecystectomy, presenting with right upper quadrant tenderness. EXAM: ULTRASOUND ABDOMEN LIMITED RIGHT UPPER QUADRANT COMPARISON:  January 14, 2015 FINDINGS: Gallbladder: The gallbladder is surgically absent. Common bile duct: Diameter: 4.1 mm Liver: A 1.5 cm x 1.5 cm x 2.3 cm hypoechoic area is seen within the anterior aspect of the left lobe of the liver. No abnormal flow is seen within this region on color Doppler evaluation. Diffusely decreased echogenicity of the liver parenchyma is seen. Portal vein is patent on color Doppler imaging with normal direction of blood flow towards the liver. Other: Of incidental note is the presence of a right-sided pleural effusion. The study is limited secondary to motion artifact as well as the inability of the patient to take a deep breath. IMPRESSION: 1. Findings consistent with history of prior cholecystectomy. 2. Focal fatty infiltration suspected within the anterior aspect of the left lobe of the  liver. Electronically Signed   By: Virgina Norfolk M.D.   On: 12/28/2021  21:40   DG CHEST PORT 1 VIEW  Result Date: 12/28/2021 CLINICAL DATA:  Left upper quadrant abdominal pain. EXAM: PORTABLE CHEST 1 VIEW COMPARISON:  One view chest x-ray 12/28/2021 at 3:12 a.m. FINDINGS: The heart size and mediastinal contours are within normal limits. Both lungs are clear. The visualized skeletal structures are unremarkable. IMPRESSION: No active disease. Electronically Signed   By: San Morelle M.D.   On: 12/28/2021 18:35    Medications: I have reviewed the patient's current medications.  Assessment: Nausea, vomiting, epigastric pain Heroin abuse Septic shock  Hepatomegaly, liver 22 cm, no ductal dilatation Normal pancreas Splenomegaly, 17 cm No bowel wall thickening No suggestion of mesenteric ischemia  Although T. bili is 5.2, AST, ALT and ALP are normal WBC improved from 32.2  to 15.9 Normocytic anemia, hemoglobin 9.2, thrombocytopenia, platelets 75 PT 16.8, INR 1.4  Plan: No further GI work-up planned-no indication of cholangitis or bowel ischemia Recommend conservative and supportive management, trend LFTs, IV fluids and IV antibiotics as needed. Please recall GI if needed.   Ronnette Juniper, MD 12/30/2021, 11:58 AM

## 2021-12-30 NOTE — Progress Notes (Signed)
Initial Nutrition Assessment  DOCUMENTATION CODES:   Not applicable  INTERVENTION:  Ensure Enlive po BID, each supplement provides 350 kcal and 20 grams of protein. MVI with minerals daily  NUTRITION DIAGNOSIS:   Increased nutrient needs related to acute illness as evidenced by estimated needs.  GOAL:   Patient will meet greater than or equal to 90% of their needs  MONITOR:   Supplement acceptance, PO intake, Diet advancement, Labs, Weight trends  REASON FOR ASSESSMENT:   Malnutrition Screening Tool    ASSESSMENT:   Pt admitted with nausea, vomiting and abdominal pain, found to have septic shock. PMH significant for asthma, HSV, migraine, heroine use and cholecystectomy.  Per GI, no further workup planned as CT with no findings of cholangitis or bowel ischemia. Continue with conservative medical management.   Food allergy: shellfish  Unsuccessful attempt to reach pt via phone call to room. Pt would likely benefit from addition of nutrition supplement to enhance nutritional adequacy given limited options for meal intake d/t being on a full liquid diet.   Unfortunately, there is limited documentation of wt history within the last year. Will continue to monitor throughout admission.  Admit wt: 72.3 kg Current wt: 70.2 kg  IV drips: abx, ketamine, levophed, potassium chloride  Labs: potassium 3.3, corrected calcium 8.8, total bilirubin 5.2  I/O's: +4464m since admission   NUTRITION - FOCUSED PHYSICAL EXAM: RD working remotely. Deferred to follow up.   Diet Order:   Diet Order             Diet full liquid Room service appropriate? Yes; Fluid consistency: Thin  Diet effective now                   EDUCATION NEEDS:   No education needs have been identified at this time  Skin:  Skin Assessment: Reviewed RN Assessment  Last BM:  7/1  Height:   Ht Readings from Last 1 Encounters:  12/28/21 '5\' 1"'$  (1.549 m)    Weight:   Wt Readings from Last 1  Encounters:  12/30/21 70.2 kg    Ideal Body Weight:     BMI:  Body mass index is 29.24 kg/m.  Estimated Nutritional Needs:   Kcal:  1700-1900  Protein:  85-100g  Fluid:  >/=1.7L  AClayborne Dana RDN, LDN Clinical Nutrition

## 2021-12-31 LAB — COMPREHENSIVE METABOLIC PANEL
ALT: 20 U/L (ref 0–44)
AST: 20 U/L (ref 15–41)
Albumin: 2.6 g/dL — ABNORMAL LOW (ref 3.5–5.0)
Alkaline Phosphatase: 185 U/L — ABNORMAL HIGH (ref 38–126)
Anion gap: 9 (ref 5–15)
BUN: 17 mg/dL (ref 6–20)
CO2: 18 mmol/L — ABNORMAL LOW (ref 22–32)
Calcium: 7.6 mg/dL — ABNORMAL LOW (ref 8.9–10.3)
Chloride: 115 mmol/L — ABNORMAL HIGH (ref 98–111)
Creatinine, Ser: 0.69 mg/dL (ref 0.44–1.00)
GFR, Estimated: >60 mL/min (ref >60)
Glucose, Bld: 78 mg/dL (ref 70–99)
Potassium: 3.5 mmol/L (ref 3.5–5.1)
Sodium: 142 mmol/L (ref 135–145)
Total Bilirubin: 3 mg/dL — ABNORMAL HIGH (ref 0.3–1.2)
Total Protein: 5.8 g/dL — ABNORMAL LOW (ref 6.5–8.1)

## 2021-12-31 LAB — CBC WITH DIFFERENTIAL/PLATELET
Abs Immature Granulocytes: 0.1 10*3/uL — ABNORMAL HIGH (ref 0.00–0.07)
Basophils Absolute: 0 10*3/uL (ref 0.0–0.1)
Basophils Relative: 0 %
Eosinophils Absolute: 0 10*3/uL (ref 0.0–0.5)
Eosinophils Relative: 0 %
HCT: 28.1 % — ABNORMAL LOW (ref 36.0–46.0)
Hemoglobin: 9.6 g/dL — ABNORMAL LOW (ref 12.0–15.0)
Immature Granulocytes: 1 %
Lymphocytes Relative: 8 %
Lymphs Abs: 1.1 10*3/uL (ref 0.7–4.0)
MCH: 29.2 pg (ref 26.0–34.0)
MCHC: 34.2 g/dL (ref 30.0–36.0)
MCV: 85.4 fL (ref 80.0–100.0)
Monocytes Absolute: 0.4 10*3/uL (ref 0.1–1.0)
Monocytes Relative: 3 %
Neutro Abs: 11.2 10*3/uL — ABNORMAL HIGH (ref 1.7–7.7)
Neutrophils Relative %: 88 %
Platelets: 83 10*3/uL — ABNORMAL LOW (ref 150–400)
RBC: 3.29 MIL/uL — ABNORMAL LOW (ref 3.87–5.11)
RDW: 13.7 % (ref 11.5–15.5)
WBC: 12.9 10*3/uL — ABNORMAL HIGH (ref 4.0–10.5)
nRBC: 0 % (ref 0.0–0.2)

## 2021-12-31 LAB — CULTURE, BLOOD (ROUTINE X 2): Special Requests: ADEQUATE

## 2021-12-31 LAB — HEPATITIS A ANTIBODY, IGM: Hep A IgM: NONREACTIVE

## 2021-12-31 LAB — HEPATITIS A ANTIBODY, TOTAL: hep A Total Ab: NONREACTIVE

## 2021-12-31 LAB — RPR: RPR Ser Ql: NONREACTIVE

## 2021-12-31 LAB — HEPATITIS C ANTIBODY: HCV Ab: NONREACTIVE

## 2021-12-31 LAB — HEPATITIS B CORE ANTIBODY, IGM: Hep B C IgM: NONREACTIVE

## 2021-12-31 LAB — HEPATITIS B SURFACE ANTIGEN: Hepatitis B Surface Ag: NONREACTIVE

## 2021-12-31 LAB — HEPATITIS B CORE ANTIBODY, TOTAL: Hep B Core Total Ab: NONREACTIVE

## 2021-12-31 MED ORDER — CEFAZOLIN SODIUM-DEXTROSE 2-4 GM/100ML-% IV SOLN
2.0000 g | Freq: Three times a day (TID) | INTRAVENOUS | Status: DC
  Administered 2021-12-31 – 2022-01-01 (×5): 2 g via INTRAVENOUS
  Filled 2021-12-31 (×8): qty 100

## 2021-12-31 MED ORDER — POTASSIUM CHLORIDE 10 MEQ/50ML IV SOLN
10.0000 meq | INTRAVENOUS | Status: AC
  Administered 2021-12-31 (×4): 10 meq via INTRAVENOUS
  Filled 2021-12-31 (×4): qty 50

## 2021-12-31 NOTE — Progress Notes (Signed)
59m of ketamine wasted, witnessed by JBelinda Block RN.

## 2021-12-31 NOTE — Progress Notes (Signed)
NAME:  Katrina [name], MRN:  [id number], DOB:  Oct 02, 1990, LOS: 3 ADMISSION DATE:  12/28/2021, CONSULTATION DATE:  12/28/21 REFERRING MD:  Sedonia Small, CHIEF COMPLAINT:  abdominal pain   History of Present Illness:  31yF with history of asthma, HSV, migraine, heroin use, cholecystectomy who presented to [facility] ED this morning for nausea, vomiting and epigastric pain. After she used heroin, woke up feeling hot, nauseated. She says it overall feels just like it did when she required cholecystectomy in 2016.    Here she was found to be in shock and received 4L total crystalloid boluses, started on LR MIVF, levo at 14 mcg/min, vanc/cefepime/flagyl with concern for sepsis and biliary source vs bacteremia vs pancreatitis. Femoral central line placed in ED.   Otherwise pertinent review of systems is negative.  Pertinent  Medical History  IVDU Cholecystectomy  Asthma HSV Migraines  Significant Hospital Events: Including procedures, antibiotic start and stop dates in addition to other pertinent events   12/28/21 admitted started on ABX, aggressive crystalloid resuscitation 7/3 weaned off NE overnight. Cont on ket gtt and dilaudid pca   Interim History / Subjective:   Still very drowsy but follows commands with a lot of encouragement.   Objective   Blood pressure 119/79, pulse 80, temperature 99.1 F (37.3 C), temperature source Oral, resp. rate (!) 37, height '5\' 1"'$  (1.549 m), weight 60.2 kg, SpO2 100 %.    FiO2 (%):  [30 %] 30 %   Intake/Output Summary (Last 24 hours) at 12/31/2021 0729 Last data filed at 12/31/2021 0616 Gross per 24 hour  Intake 1224.17 ml  Output 1595 ml  Net -370.83 ml   Filed Weights   12/29/21 0500 12/30/21 0400 12/31/21 0300  Weight: 72.3 kg 70.2 kg 60.2 kg    Examination: General: NAD, sleeping  HEENT: NCAT MMM Lungs: normal WOB CTAB CV: RRR Abdomen: Soft non-distended, normoactive x4. Tenderness over upper abdomen with deep palpation Extremities: no acute joint  deformity. No cyanosis or clubbing Skin: scattered track marks. C/d/w Neuro: awakens to voice, very drowsy.following commands. No focal deficit   Labs/imaging reviewed   Bedside US with tiny left pleural effusion which appears simple, no right pleural effusion  Resolved Hospital Problem list    Shock AKI Transaminitis   Problems    Severe sepsis due to Polymicrobial bacteremia, methicillin resistant staphs P -cont vanc -follow Bcx -Follow white count and fever curve -ID consulted, will discuss TEE with pt when more alert  Acute toxic encephalopathy Substance use withdrawal, medication effect (ketamine, cefepime) possibilities -ctm response to discontinuation ketamine, cefepime, if failing to clear consider less likely possibility of HSV encephalitis given oral outbreak  Abdominal pain Unrevealing extensive workup to date, possibility of hyperalgesia.  P -as below, dilaudid PCA + ket gtt  -serial abdominal exams  -when taking PO, would try some GI cocktail w lido and see if that helps with the pain some. Right now too drowsy   Labial vesicular rash History of herpes P - zovirax, when taking PO switch to valtrex  Hyperbilirubinemia probably Cholestasis of sepsis  P -PRN LFTs, bili -cont abx as above   Hypokalemia Hyponatremia Hypomagnesemia P - correct electrolytes  Thrombocytopenia P -trend CBC  -SCDs  Substance use disorder IVDU Opiate dependence Chronic pain P -dilaudid PCA + ket gtt. Wean off ketamine gtt today -TOC when appropriate    Best Practice (right click and "Reselect all SmartList Selections" daily)   Diet/type: NPO w/ oral meds DVT prophylaxis: SCD GI prophylaxis: N/A  Lines: Central line - will establish peripheral access today and dc central line Foley:  Yes, and it is still needed Code Status:  full code Last date of multidisciplinary goals of care discussion -- will update mom later today   CRITICAL CARE Performed by:  Maryjane Hurter   Total critical care time: 36 minutes  Critical care time was exclusive of separately billable procedures and treating other patients. Critical care was necessary to treat or prevent imminent or life-threatening deterioration.  Critical care was time spent personally by me on the following activities: development of treatment plan with patient and/or surrogate as well as nursing, discussions with consultants, evaluation of patient's response to treatment, examination of patient, obtaining history from patient or surrogate, ordering and performing treatments and interventions, ordering and review of laboratory studies, ordering and review of radiographic studies, pulse oximetry and re-evaluation of patient's condition.  Fredirick Maudlin Pulmonary/Critical Care  12/31/2021, 7:29 AM

## 2021-12-31 NOTE — Progress Notes (Signed)
[facility] ADULT ICU REPLACEMENT PROTOCOL   The patient does apply for the [facility] Adult ICU Electrolyte Replacment Protocol based on the criteria listed below:   1.Exclusion criteria: TCTS patients, ECMO patients, and Dialysis patients 2. Is GFR >/= 30 ml/min? Yes.    Patient's GFR today is >60 3. Is SCr </= 2? Yes.   Patient's SCr is 0.69 mg/dL 4. Did SCr increase >/= 0.5 in 24 hours? No. 5.Pt's weight >40kg  Yes.   6. Abnormal electrolyte(s): K+ 3.5  7. Electrolytes replaced per protocol 8.  Call MD STAT for K+ </= 2.5, Phos </= 1, or Mag </= 1 Physician:  Macario Carls 12/31/2021 4:15 AM

## 2021-12-31 NOTE — Progress Notes (Addendum)
Id brief note   Staph lugdunensis bacteremia/shock Shock resolved Fever resolved  Wbc improving Lft up with alkphos   Repeat bcx negative  Tte nondiagnostic  Abd ct reviewed trace ascites only   A/p Suspect staph lugdeninsis sepsis Staph epi unclear significance at this time pending tee  No sign of gram negative sepsis. Trace ascites only. Periportal edema of unclear significance Lft up likely due to sepsis  Pcn allergy but should be ok with cefazolin as side chain is different   -await tee -both staph sensitive to oxacillin will switch abx to cefazolin -f/u final report repeat bcx  Dr [place] Bali to resume care tomorrow

## 2021-12-31 NOTE — Progress Notes (Signed)
IVT consult placed for PIV assessment so CVC can be removed. Patient has 1 PIV in her left AC. [place] attempt made in right [facility] however it was difficult to flush and therefore removed. Patient has limited vasculature options given her IVDU history. Upon assessment veins weren't compressible. Recommended to primary RN to leave femoral line in or utilize current PIV that is in place.   Mattheo Swindle Lorita Officer, RN

## 2022-01-01 DIAGNOSIS — R6521 Severe sepsis with septic shock: Secondary | ICD-10-CM

## 2022-01-01 DIAGNOSIS — A419 Sepsis, unspecified organism: Secondary | ICD-10-CM

## 2022-01-01 LAB — COMPREHENSIVE METABOLIC PANEL
ALT: 19 U/L (ref 0–44)
AST: 16 U/L (ref 15–41)
Albumin: 2.6 g/dL — ABNORMAL LOW (ref 3.5–5.0)
Alkaline Phosphatase: 162 U/L — ABNORMAL HIGH (ref 38–126)
Anion gap: 5 (ref 5–15)
BUN: 18 mg/dL (ref 6–20)
CO2: 22 mmol/L (ref 22–32)
Calcium: 7.7 mg/dL — ABNORMAL LOW (ref 8.9–10.3)
Chloride: 113 mmol/L — ABNORMAL HIGH (ref 98–111)
Creatinine, Ser: 0.52 mg/dL (ref 0.44–1.00)
GFR, Estimated: >60 mL/min (ref >60)
Glucose, Bld: 95 mg/dL (ref 70–99)
Potassium: 3.3 mmol/L — ABNORMAL LOW (ref 3.5–5.1)
Sodium: 140 mmol/L (ref 135–145)
Total Bilirubin: 1.5 mg/dL — ABNORMAL HIGH (ref 0.3–1.2)
Total Protein: 6.1 g/dL — ABNORMAL LOW (ref 6.5–8.1)

## 2022-01-01 LAB — HCV INTERPRETATION

## 2022-01-01 LAB — HEPATITIS B SURFACE ANTIBODY, QUANTITATIVE: Hep B S AB Quant (Post): 4.4 m[IU]/mL — ABNORMAL LOW (ref >9.9)

## 2022-01-01 LAB — HCV AB W REFLEX TO QUANT PCR: HCV Ab: NONREACTIVE

## 2022-01-01 MED ORDER — POTASSIUM CHLORIDE 20 MEQ PO PACK
40.0000 meq | PACK | Freq: Once | ORAL | Status: AC
  Administered 2022-01-01: 40 meq via ORAL
  Filled 2022-01-01: qty 2

## 2022-01-01 MED ORDER — SODIUM CHLORIDE 0.9 % IV SOLN: INTRAVENOUS | Status: DC

## 2022-01-01 NOTE — Progress Notes (Signed)
PROGRESS NOTE  Katrina [name] KCL:[id number] DOB: 04-23-1991 DOA: 12/28/2021 PCP: Patient, No Pcp Per   LOS: 4 days   Brief Narrative / Interim history: 31 year old female with asthma, HSV, heroin use, prior cholecystectomy who comes into the hospital for nausea, vomiting, epigastric pain.  She was admitted to the ICU, found to be in shock and required vasopressors.  She was found to be bacteremic and ID was consulted.  Eventually improved and transferred to the hospitalist service on 7/5  Subjective / 24h Interval events: Sleepy this morning, complains of abdominal pain that is been going on for the past 4 days.  States that it is epigastric.  Assesement and Plan: Principal Problem:   Septic shock ([place])  Principal problem Septic shock due to polymicrobial bacteremia-blood cultures on 7/1 show Staphylococcus lugdunensis as well as epidermidis.  It is likely in the setting of underlying IV drug use.  ID consulted and following, currently on Ancef.  Shock physiology has resolved and she is off pressors now.  2D echo without any clear evidence of endocarditis, patient needed TEE.  Cardiology contacted today. -Surveillance culture 7/2 without growth.  Need to convert the femoral central line to a PICC line once surveillance cultures are negative at 48 hours  Active problems Abdominal pain-unrevealing extensive work-up.  On Dilaudid PCA, initially was on ketamine drip but now has been off.  Allow clears today, if she tolerates we will switch off Dilaudid PCA tomorrow.  Will place on GI cocktail as well  Oral herpes-on Zovirax ointment  Elevated LFTs-in the setting of #1.  Monitor.  Improving  Hyponatremia, hypokalemia, hypomagnesemia-continue to monitor and replete as indicated  Thrombocytopenia-trend CBCs  IVDU-TOC consult.  Not a candidate for home IV antibiotics  Scheduled Meds:  acyclovir ointment   Topical Q4H   Chlorhexidine Gluconate Cloth  6 each Topical Daily   feeding  supplement  237 mL Oral BID BM   HYDROmorphone   Intravenous Q4H   multivitamin with minerals  1 tablet Oral Daily   sodium chloride flush  10-40 mL Intracatheter Q12H   Continuous Infusions:  sodium chloride 10 mL/hr at 01/01/22 0400   sodium chloride 250 mL (01/01/22 0535)    ceFAZolin (ANCEF) IV Stopped (01/01/22 0853)   PRN Meds:.Place/Maintain arterial line **AND** sodium chloride, acetaminophen, diphenhydrAMINE **OR** diphenhydrAMINE, docusate sodium, metoCLOPramide (REGLAN) injection, naloxone **AND** sodium chloride flush, ondansetron (ZOFRAN) IV, mouth rinse, polyethylene glycol, sodium chloride flush  Diet Orders (From admission, onward)     Start     Ordered   01/01/22 0653  Diet clear liquid Room service appropriate? Yes; Fluid consistency: Thin  Diet effective now       Question Answer Comment  Room service appropriate? Yes   Fluid consistency: Thin      01/01/22 0652            DVT prophylaxis: SCDs Start: 12/28/21 4944   Lab Results  Component Value Date   PLT 83 (L) 12/31/2021      Code Status: Full Code  Family Communication: No family at bedside  Status is: Inpatient  Remains inpatient appropriate because: Needs IV antibiotics  Level of care: Progressive  Consultants:  ID  Objective: Vitals:   01/01/22 0700 01/01/22 0744 01/01/22 0757 01/01/22 1154  BP: 132/84     Pulse: 68     Resp: (!) 36  (!) 27 (!) 30  Temp:  99.7 F (37.6 C)    TempSrc:  Oral    SpO2: 100%  Weight:      Height:        Intake/Output Summary (Last 24 hours) at 01/01/2022 1204 Last data filed at 01/01/2022 0400 Gross per 24 hour  Intake 431.86 ml  Output 875 ml  Net -443.14 ml   Wt Readings from Last 3 Encounters:  01/01/22 58.6 kg  05/27/19 70.8 kg  01/25/19 77.1 kg    Examination:  Constitutional: NAD, sleepy Eyes: no scleral icterus ENMT: Mucous membranes are moist.  Neck: normal, supple Respiratory: clear to auscultation bilaterally, no wheezing,  no crackles. Normal respiratory effort. No accessory muscle use.  Cardiovascular: Regular rate and rhythm, no murmurs / rubs / gallops. No LE edema. Good peripheral pulses Abdomen: non distended, no tenderness. Bowel sounds positive.  Musculoskeletal: no clubbing / cyanosis.  Skin: no rashes Neurologic: non focal   Data Reviewed: I have independently reviewed following labs and imaging studies   CBC Recent Labs  Lab 12/28/21 0350 12/28/21 1017 12/29/21 0332 12/30/21 0444 12/31/21 0252  WBC 5.8  --  32.2* 15.9* 12.9*  HGB 13.0  --  10.2* 9.2* 9.6*  HCT 37.8  --  28.4* 26.6* 28.1*  PLT 158 109* 106* 75* 83*  MCV 86.1  --  84.0 85.3 85.4  MCH 29.6  --  30.2 29.5 29.2  MCHC 34.4  --  35.9 34.6 34.2  RDW 13.8  --  13.8 14.6 13.7  LYMPHSABS 0.3*  --  0.7 0.9 1.1  MONOABS 0.0*  --  1.2* 0.7 0.4  EOSABS 0.0  --  0.0 0.0 0.0  BASOSABS 0.0  --  0.2* 0.0 0.0    Recent Labs  Lab 12/28/21 0350 12/28/21 0647 12/28/21 0801 12/28/21 0927 12/28/21 1017 12/28/21 1230 12/28/21 1545 12/28/21 1848 12/29/21 0332 12/29/21 2104 12/30/21 0444 12/31/21 0252 01/01/22 0243  NA 140  --   --   --   --  140  --   --  143  --  141 142 140  K 2.3*  --   --   --   --  2.9*  --   --  3.4*  --  3.3* 3.5 3.3*  CL 109  --   --   --   --  112*  --   --  117*  --  113* 115* 113*  CO2 20*  --   --   --   --  19*  --   --  17*  --  22 18* 22  GLUCOSE 88  --   --   --   --  130*  --   --  84  --  86 78 95  BUN 21*  --   --   --   --  24*  --   --  24*  --  '16 17 18  '$ CREATININE 1.59*  --   --   --   --  1.76*  --   --  1.18*  --  0.65 0.69 0.52  CALCIUM 7.8*  --   --   --   --  7.1*  --   --  7.3*  --  7.6* 7.6* 7.7*  AST 42*  --   --   --   --  35  --  31 29  --  '26 20 16  '$ ALT 24  --   --   --   --  22  --  23 23  --  '22 20 19  '$ ALKPHOS 127*  --   --   --   --  69  --  74 78  --  95 185* 162*  BILITOT 6.0*  --   --   --   --  4.3*   < > 4.5* 5.2*  --  5.2* 3.0* 1.5*  ALBUMIN 3.3*  --   --   --   --   2.3*  --  2.4* 2.5*  --  2.5* 2.6* 2.6*  MG  --   --  1.2*  --   --   --   --   --  1.1* 2.4  --   --   --   DDIMER  --   --   --   --  >20.00*  --   --   --   --   --   --   --   --   LATICACIDVEN 5.0* 4.0*  --  3.6*  --   --   --  2.7*  --   --   --   --   --   INR 2.2*  --   --   --  2.3*  --   --   --  2.2*  --  1.4*  --   --    < > = values in this interval not displayed.    ------------------------------------------------------------------------------------------------------------------ No results for input(s): "CHOL", "HDL", "LDLCALC", "TRIG", "CHOLHDL", "LDLDIRECT" in the last 72 hours.  No results found for: "HGBA1C" ------------------------------------------------------------------------------------------------------------------ No results for input(s): "TSH", "T4TOTAL", "T3FREE", "THYROIDAB" in the last 72 hours.  Invalid input(s): "FREET3"  Cardiac Enzymes No results for input(s): "CKMB", "TROPONINI", "MYOGLOBIN" in the last 168 hours.  Invalid input(s): "CK" ------------------------------------------------------------------------------------------------------------------ No results found for: "BNP"  CBG: No results for input(s): "GLUCAP" in the last 168 hours.  Recent Results (from the past 240 hour(s))  Blood Culture (routine x 2)     Status: None (Preliminary result)   Collection Time: 12/28/21  3:50 AM   Specimen: BLOOD  Result Value Ref Range Status   Specimen Description   Final    BLOOD LEFT ANTECUBITAL Performed at [facility] [address], [place], [place] [address]    Special Requests   Final    BOTTLES DRAWN AEROBIC AND ANAEROBIC Blood Culture adequate volume Performed at [facility] [address] [place], [place] [address]    Culture   Final    NO GROWTH 3 DAYS Performed at [facility], [place] [address] [place], [place] [address]    Report Status PENDING  Incomplete  Blood Culture (routine x 2)      Status: Abnormal   Collection Time: 12/28/21  3:50 AM   Specimen: BLOOD RIGHT WRIST  Result Value Ref Range Status   Specimen Description   Final    BLOOD RIGHT WRIST Performed at [facility], [address] [place], [place] [address]    Special Requests   Final    BOTTLES DRAWN AEROBIC AND ANAEROBIC Blood Culture adequate volume Performed at [facility] [address] [place], [place] [address]    Culture  Setup Time   Final    GRAM POSITIVE COCCI AEROBIC BOTTLE ONLY CRITICAL RESULT CALLED TO, READ BACK BY AND VERIFIED WITH: PHARMD MICHELLE BELL 12/29/21'@2'$ :47 BY TW Performed at [facility], [place] [address] [place], [place] [address]    Culture (A)  Final    STAPHYLOCOCCUS LUGDUNENSIS STAPHYLOCOCCUS EPIDERMIDIS    Report Status 12/31/2021 FINAL  Final   Organism ID,  Bacteria STAPHYLOCOCCUS LUGDUNENSIS  Final   Organism ID, Bacteria STAPHYLOCOCCUS EPIDERMIDIS  Final      Susceptibility   Staphylococcus epidermidis - MIC*    CIPROFLOXACIN <=0.5 SENSITIVE Sensitive     ERYTHROMYCIN >=8 RESISTANT Resistant     GENTAMICIN <=0.5 SENSITIVE Sensitive     OXACILLIN <=0.25 SENSITIVE Sensitive     TETRACYCLINE <=1 SENSITIVE Sensitive     VANCOMYCIN 1 SENSITIVE Sensitive     TRIMETH/SULFA <=10 SENSITIVE Sensitive     CLINDAMYCIN <=0.25 SENSITIVE Sensitive     RIFAMPIN <=0.5 SENSITIVE Sensitive     Inducible Clindamycin NEGATIVE Sensitive     * STAPHYLOCOCCUS EPIDERMIDIS   Staphylococcus lugdunensis - MIC*    CIPROFLOXACIN <=0.5 SENSITIVE Sensitive     ERYTHROMYCIN <=0.25 SENSITIVE Sensitive     GENTAMICIN <=0.5 SENSITIVE Sensitive     OXACILLIN 2 SENSITIVE Sensitive     TETRACYCLINE <=1 SENSITIVE Sensitive     VANCOMYCIN <=0.5 SENSITIVE Sensitive     TRIMETH/SULFA <=10 SENSITIVE Sensitive     CLINDAMYCIN <=0.25 SENSITIVE Sensitive     RIFAMPIN <=0.5 SENSITIVE Sensitive     Inducible Clindamycin NEGATIVE Sensitive     * STAPHYLOCOCCUS  LUGDUNENSIS  Blood Culture ID Panel (Reflexed)     Status: Abnormal   Collection Time: 12/28/21  3:50 AM  Result Value Ref Range Status   Enterococcus faecalis NOT DETECTED NOT DETECTED Final   Enterococcus Faecium NOT DETECTED NOT DETECTED Final   Listeria monocytogenes NOT DETECTED NOT DETECTED Final   Staphylococcus species DETECTED (A) NOT DETECTED Final    Comment: CRITICAL RESULT CALLED TO, READ BACK BY AND VERIFIED WITH: PHARMD MICHELLE BELL 12/29/21'@2'$ :47 BY TW    Staphylococcus aureus (BCID) NOT DETECTED NOT DETECTED Final   Staphylococcus epidermidis DETECTED (A) NOT DETECTED Final    Comment: Methicillin (oxacillin) resistant coagulase negative staphylococcus. Possible blood culture contaminant (unless isolated from more than one blood culture draw or clinical case suggests pathogenicity). No antibiotic treatment is indicated for blood  culture contaminants. CRITICAL RESULT CALLED TO, READ BACK BY AND VERIFIED WITH: PHARMD MICHELLE BELL 12/29/21'@2'$ :47 BY TW    Staphylococcus lugdunensis DETECTED (A) NOT DETECTED Final    Comment: Methicillin (oxacillin) resistant coagulase negative staphylococcus. Possible blood culture contaminant (unless isolated from more than one blood culture draw or clinical case suggests pathogenicity). No antibiotic treatment is indicated for blood  culture contaminants. CRITICAL RESULT CALLED TO, READ BACK BY AND VERIFIED WITH: PHARMD MICHELLE BELL 12/29/21'@2'$ :66 BY TW    Streptococcus species NOT DETECTED NOT DETECTED Final   Streptococcus agalactiae NOT DETECTED NOT DETECTED Final   Streptococcus pneumoniae NOT DETECTED NOT DETECTED Final   Streptococcus pyogenes NOT DETECTED NOT DETECTED Final   A.calcoaceticus-baumannii NOT DETECTED NOT DETECTED Final   Bacteroides fragilis NOT DETECTED NOT DETECTED Final   Enterobacterales NOT DETECTED NOT DETECTED Final   Enterobacter cloacae complex NOT DETECTED NOT DETECTED Final   Escherichia coli NOT DETECTED NOT  DETECTED Final   Klebsiella aerogenes NOT DETECTED NOT DETECTED Final   Klebsiella oxytoca NOT DETECTED NOT DETECTED Final   Klebsiella pneumoniae NOT DETECTED NOT DETECTED Final   Proteus species NOT DETECTED NOT DETECTED Final   Salmonella species NOT DETECTED NOT DETECTED Final   Serratia marcescens NOT DETECTED NOT DETECTED Final   Haemophilus influenzae NOT DETECTED NOT DETECTED Final   Neisseria meningitidis NOT DETECTED NOT DETECTED Final   Pseudomonas aeruginosa NOT DETECTED NOT DETECTED Final   Stenotrophomonas maltophilia NOT DETECTED NOT DETECTED Final   Candida  albicans NOT DETECTED NOT DETECTED Final   Candida auris NOT DETECTED NOT DETECTED Final   Candida glabrata NOT DETECTED NOT DETECTED Final   Candida krusei NOT DETECTED NOT DETECTED Final   Candida parapsilosis NOT DETECTED NOT DETECTED Final   Candida tropicalis NOT DETECTED NOT DETECTED Final   Cryptococcus neoformans/gattii NOT DETECTED NOT DETECTED Final   Methicillin resistance mecA/C DETECTED (A) NOT DETECTED Final    Comment: CRITICAL RESULT CALLED TO, READ BACK BY AND VERIFIED WITH: PHARMD MICHELLE BELL 12/29/21'@2'$ :47 BY TW Performed at [facility] [address], [place], [place] [address]   Urine Culture     Status: Abnormal   Collection Time: 12/28/21  4:01 AM   Specimen: In/Out Cath Urine  Result Value Ref Range Status   Specimen Description   Final    IN/OUT CATH URINE Performed at [facility] [address], [place], [place] [address]    Special Requests   Final    NONE Performed at [facility], [place] [address] [place], [place] [address]    Culture >=100,000 COLONIES/mL ESCHERICHIA COLI (A)  Final   Report Status 12/30/2021 FINAL  Final   Organism ID, Bacteria ESCHERICHIA COLI (A)  Final      Susceptibility   Escherichia coli - MIC*    AMPICILLIN 8 SENSITIVE Sensitive     CEFAZOLIN <=4 SENSITIVE Sensitive     CEFEPIME <=0.12 SENSITIVE Sensitive      CEFTRIAXONE <=0.25 SENSITIVE Sensitive     CIPROFLOXACIN <=0.25 SENSITIVE Sensitive     GENTAMICIN <=1 SENSITIVE Sensitive     IMIPENEM <=0.25 SENSITIVE Sensitive     NITROFURANTOIN <=16 SENSITIVE Sensitive     TRIMETH/SULFA <=20 SENSITIVE Sensitive     AMPICILLIN/SULBACTAM <=2 SENSITIVE Sensitive     PIP/TAZO <=4 SENSITIVE Sensitive     * >=100,000 COLONIES/mL ESCHERICHIA COLI  MRSA Next Gen by PCR, Nasal     Status: None   Collection Time: 12/28/21 10:05 AM   Specimen: Nasal Mucosa; Nasal Swab  Result Value Ref Range Status   MRSA by PCR Next Gen NOT DETECTED NOT DETECTED Final    Comment: (NOTE) The GeneXpert MRSA Assay (FDA approved for NASAL specimens only), is one component of a comprehensive MRSA colonization surveillance program. It is not intended to diagnose MRSA infection nor to guide or monitor treatment for MRSA infections. Test performance is not FDA approved in patients less than 67 years old. Performed at [facility], [place] [address], [place], [place] [address]   Culture, blood (Routine X 2) w Reflex to ID Panel     Status: None (Preliminary result)   Collection Time: 12/29/21 11:14 AM   Specimen: BLOOD  Result Value Ref Range Status   Specimen Description   Final    BLOOD RIGHT ANTECUBITAL Performed at [facility] [address], [place], [place] [address]    Special Requests   Final    BOTTLES DRAWN AEROBIC AND ANAEROBIC Blood Culture adequate volume Performed at [facility] [address], [place], [place] [address]    Culture   Final    NO GROWTH 2 DAYS Performed at [facility] [address] [place],  [address]    Report Status PENDING  Incomplete  Culture, blood (Routine X 2) w Reflex to ID Panel     Status: None (Preliminary result)   Collection Time: 12/29/21 11:14 AM   Specimen: BLOOD  Result Value Ref  Range Status   Specimen Description   Final    BLOOD SITE NOT  SPECIFIED Performed at [facility] [address], [place], [place] [address]    Special Requests   Final    BOTTLES DRAWN AEROBIC AND ANAEROBIC Blood Culture results may not be optimal due to an inadequate volume of blood received in culture bottles Performed at [facility] [address] [place], [place] [address]    Culture   Final    NO GROWTH 2 DAYS Performed at [facility] [address], [place], [place] [address]    Report Status PENDING  Incomplete     Radiology Studies: No results found.   Marzetta Board, MD, PhD [facility]  Between 7 am - 7 pm I am available, please contact me via Amion (for emergencies) or Securechat (non urgent messages)  Between 7 pm - 7 am I am not available, please contact night coverage MD/APP via Amion

## 2022-01-01 NOTE — Progress Notes (Signed)
[facility] Progress Note Patient Name: [name] DOB: 1991-05-09 MRN: [id number]   Date of Service  01/01/2022  HPI/Events of Note  Informed of hypokalemia with K 3.3, crea 0.52.   eICU Interventions  Replete K - 33mq PO ordered.      Intervention Category Minor Interventions: Electrolytes abnormality - evaluation and management  VElsie Lincoln7/10/2021, 4:56 AM

## 2022-01-01 NOTE — Progress Notes (Signed)
   [facility] has been requested to perform a transesophageal echocardiogram on Katrina [name] for bacteremia.  After careful review of history and examination, the risks and benefits of transesophageal echocardiogram have been explained including risks of esophageal damage, perforation (1:10,000 risk), bleeding, pharyngeal hematoma as well as other potential complications associated with anesthesia including aspiration, arrhythmia, respiratory failure and death. Alternatives to treatment were discussed, questions were answered. Patient is willing to proceed.   31 yo with IV drug use history presented with septic shock due to polymicrobial bacteremia. Vital stable. WBC elevated, hgb 9.6, platelet 83. Repeat CBC to check platelet in the morning of TEE  Katrina [name], [place] 01/01/2022 1:19 PM

## 2022-01-01 NOTE — TOC Progression Note (Signed)
Transition of Care [facility]) - Progression Note    Patient Details  Name: Katrina [name] MRN: [id number] Date of Birth: 1990-07-24  [facility] CM/SW Contact  Leeroy Cha, RN Phone Number: 01/01/2022, 7:17 AM  Clinical Narrative:    [id number] chart reviewed.  Following for toc needs.  Plan is to return home with self-care at this time.   Expected Discharge Plan: Home/Self Care Barriers to Discharge: Continued Medical Work up  Expected Discharge Plan and Services Expected Discharge Plan: Home/Self Care   Discharge Planning Services: CM Consult   Living arrangements for the past 2 months: Apartment                                       Social Determinants of Health (SDOH) Interventions    Readmission Risk Interventions     No data to display

## 2022-01-02 ENCOUNTER — Inpatient Hospital Stay (HOSPITAL_COMMUNITY)
Admission: EM | Admit: 2022-01-02 | Discharge: 2022-01-02 | DRG: 872 | Discharge status: Against Medical Advice | Payer: Self-pay | Attending: Internal Medicine | Admitting: Internal Medicine

## 2022-01-02 ENCOUNTER — Encounter (HOSPITAL_COMMUNITY): Payer: Self-pay

## 2022-01-02 ENCOUNTER — Other Ambulatory Visit: Payer: Self-pay

## 2022-01-02 DIAGNOSIS — Z9049 Acquired absence of other specified parts of digestive tract: Secondary | ICD-10-CM

## 2022-01-02 DIAGNOSIS — D649 Anemia, unspecified: Secondary | ICD-10-CM | POA: Diagnosis present

## 2022-01-02 DIAGNOSIS — Z7151 Drug abuse counseling and surveillance of drug abuser: Secondary | ICD-10-CM

## 2022-01-02 DIAGNOSIS — B9689 Other specified bacterial agents as the cause of diseases classified elsewhere: Secondary | ICD-10-CM | POA: Diagnosis present

## 2022-01-02 DIAGNOSIS — R7881 Bacteremia: Principal | ICD-10-CM | POA: Diagnosis present

## 2022-01-02 DIAGNOSIS — Z888 Allergy status to other drugs, medicaments and biological substances status: Secondary | ICD-10-CM

## 2022-01-02 DIAGNOSIS — R112 Nausea with vomiting, unspecified: Secondary | ICD-10-CM | POA: Diagnosis present

## 2022-01-02 DIAGNOSIS — R109 Unspecified abdominal pain: Secondary | ICD-10-CM

## 2022-01-02 DIAGNOSIS — A6009 Herpesviral infection of other urogenital tract: Secondary | ICD-10-CM | POA: Diagnosis present

## 2022-01-02 DIAGNOSIS — F1910 Other psychoactive substance abuse, uncomplicated: Secondary | ICD-10-CM

## 2022-01-02 DIAGNOSIS — E876 Hypokalemia: Secondary | ICD-10-CM | POA: Diagnosis present

## 2022-01-02 DIAGNOSIS — Z91013 Allergy to seafood: Secondary | ICD-10-CM

## 2022-01-02 DIAGNOSIS — Z716 Tobacco abuse counseling: Secondary | ICD-10-CM

## 2022-01-02 DIAGNOSIS — Z880 Allergy status to penicillin: Secondary | ICD-10-CM

## 2022-01-02 DIAGNOSIS — R1013 Epigastric pain: Secondary | ICD-10-CM | POA: Diagnosis present

## 2022-01-02 DIAGNOSIS — F17210 Nicotine dependence, cigarettes, uncomplicated: Secondary | ICD-10-CM | POA: Diagnosis present

## 2022-01-02 DIAGNOSIS — Z20822 Contact with and (suspected) exposure to covid-19: Secondary | ICD-10-CM | POA: Diagnosis present

## 2022-01-02 DIAGNOSIS — E8809 Other disorders of plasma-protein metabolism, not elsewhere classified: Secondary | ICD-10-CM | POA: Diagnosis present

## 2022-01-02 DIAGNOSIS — Z79899 Other long term (current) drug therapy: Secondary | ICD-10-CM

## 2022-01-02 DIAGNOSIS — F17200 Nicotine dependence, unspecified, uncomplicated: Secondary | ICD-10-CM | POA: Diagnosis present

## 2022-01-02 DIAGNOSIS — Z560 Unemployment, unspecified: Secondary | ICD-10-CM

## 2022-01-02 LAB — CBC WITH DIFFERENTIAL/PLATELET
Abs Immature Granulocytes: 0.49 10*3/uL — ABNORMAL HIGH (ref 0.00–0.07)
Basophils Absolute: 0.1 10*3/uL (ref 0.0–0.1)
Basophils Relative: 1 %
Eosinophils Absolute: 0.1 10*3/uL (ref 0.0–0.5)
Eosinophils Relative: 1 %
HCT: 33.5 % — ABNORMAL LOW (ref 36.0–46.0)
Hemoglobin: 11.6 g/dL — ABNORMAL LOW (ref 12.0–15.0)
Immature Granulocytes: 6 %
Lymphocytes Relative: 18 %
Lymphs Abs: 1.5 10*3/uL (ref 0.7–4.0)
MCH: 29.1 pg (ref 26.0–34.0)
MCHC: 34.6 g/dL (ref 30.0–36.0)
MCV: 84 fL (ref 80.0–100.0)
Monocytes Absolute: 0.7 10*3/uL (ref 0.1–1.0)
Monocytes Relative: 9 %
Neutro Abs: 5.3 10*3/uL (ref 1.7–7.7)
Neutrophils Relative %: 65 %
Platelets: 173 10*3/uL (ref 150–400)
RBC: 3.99 MIL/uL (ref 3.87–5.11)
RDW: 13 % (ref 11.5–15.5)
WBC: 8.2 10*3/uL (ref 4.0–10.5)
nRBC: 0 % (ref 0.0–0.2)

## 2022-01-02 LAB — COMPREHENSIVE METABOLIC PANEL
ALT: 30 U/L (ref 0–44)
AST: 33 U/L (ref 15–41)
Albumin: 2.8 g/dL — ABNORMAL LOW (ref 3.5–5.0)
Alkaline Phosphatase: 144 U/L — ABNORMAL HIGH (ref 38–126)
Anion gap: 7 (ref 5–15)
BUN: 13 mg/dL (ref 6–20)
CO2: 25 mmol/L (ref 22–32)
Calcium: 8 mg/dL — ABNORMAL LOW (ref 8.9–10.3)
Chloride: 105 mmol/L (ref 98–111)
Creatinine, Ser: 0.68 mg/dL (ref 0.44–1.00)
GFR, Estimated: >60 mL/min (ref >60)
Glucose, Bld: 102 mg/dL — ABNORMAL HIGH (ref 70–99)
Potassium: 3.3 mmol/L — ABNORMAL LOW (ref 3.5–5.1)
Sodium: 137 mmol/L (ref 135–145)
Total Bilirubin: 1.3 mg/dL — ABNORMAL HIGH (ref 0.3–1.2)
Total Protein: 6.8 g/dL (ref 6.5–8.1)

## 2022-01-02 LAB — URINALYSIS, ROUTINE W REFLEX MICROSCOPIC
Bacteria, UA: NONE SEEN
Bilirubin Urine: NEGATIVE
Glucose, UA: NEGATIVE mg/dL
Hgb urine dipstick: NEGATIVE
Ketones, ur: NEGATIVE mg/dL
Leukocytes,Ua: NEGATIVE
Nitrite: NEGATIVE
Protein, ur: 30 mg/dL — AB
Specific Gravity, Urine: 1.028 (ref 1.005–1.030)
pH: 5 (ref 5.0–8.0)

## 2022-01-02 LAB — RAPID URINE DRUG SCREEN, HOSP PERFORMED
Amphetamines: POSITIVE — AB
Barbiturates: NOT DETECTED
Benzodiazepines: NOT DETECTED
Cocaine: NOT DETECTED
Opiates: POSITIVE — AB
Tetrahydrocannabinol: NOT DETECTED

## 2022-01-02 LAB — CULTURE, BLOOD (ROUTINE X 2)
Culture: NO GROWTH
Special Requests: ADEQUATE

## 2022-01-02 LAB — RESP PANEL BY RT-PCR (FLU A&B, COVID) ARPGX2
Influenza A by PCR: NEGATIVE
Influenza B by PCR: NEGATIVE
SARS Coronavirus 2 by RT PCR: NEGATIVE

## 2022-01-02 LAB — PREGNANCY, URINE: Preg Test, Ur: NEGATIVE

## 2022-01-02 LAB — LIPASE, BLOOD: Lipase: 26 U/L (ref 11–51)

## 2022-01-02 MED ORDER — FAMOTIDINE IN NACL 20-0.9 MG/50ML-% IV SOLN
20.0000 mg | Freq: Once | INTRAVENOUS | Status: AC
  Administered 2022-01-02: 20 mg via INTRAVENOUS
  Filled 2022-01-02: qty 50

## 2022-01-02 MED ORDER — DICYCLOMINE HCL 20 MG PO TABS: 20.0000 mg | ORAL_TABLET | Freq: Four times a day (QID) | ORAL | Status: DC | PRN

## 2022-01-02 MED ORDER — LACTATED RINGERS IV BOLUS
1000.0000 mL | Freq: Once | INTRAVENOUS | Status: AC
Start: 2022-01-02 — End: 2022-01-02
  Administered 2022-01-02: 1000 mL via INTRAVENOUS

## 2022-01-02 MED ORDER — ONDANSETRON HCL 4 MG/2ML IJ SOLN: 4.0000 mg | Freq: Four times a day (QID) | INTRAMUSCULAR | Status: DC | PRN

## 2022-01-02 MED ORDER — ONDANSETRON 4 MG PO TBDP: 4.0000 mg | ORAL_TABLET | Freq: Four times a day (QID) | ORAL | Status: DC | PRN

## 2022-01-02 MED ORDER — CLONIDINE HCL 0.1 MG PO TABS: 0.1000 mg | ORAL_TABLET | Freq: Four times a day (QID) | ORAL | Status: DC

## 2022-01-02 MED ORDER — SODIUM CHLORIDE 0.9 % IV SOLN: INTRAVENOUS | Status: DC

## 2022-01-02 MED ORDER — HYDROMORPHONE 1 MG/ML IV SOLN
INTRAVENOUS | Status: DC
  Filled 2022-01-02: qty 30

## 2022-01-02 MED ORDER — ALUM & MAG HYDROXIDE-SIMETH 200-200-20 MG/5ML PO SUSP
30.0000 mL | Freq: Once | ORAL | Status: DC
  Filled 2022-01-02: qty 30

## 2022-01-02 MED ORDER — MORPHINE SULFATE (PF) 4 MG/ML IV SOLN
4.0000 mg | Freq: Once | INTRAVENOUS | Status: AC
  Administered 2022-01-02: 4 mg via INTRAVENOUS
  Filled 2022-01-02: qty 1

## 2022-01-02 MED ORDER — NAPROXEN 500 MG PO TABS: 500.0000 mg | ORAL_TABLET | Freq: Two times a day (BID) | ORAL | Status: DC | PRN

## 2022-01-02 MED ORDER — POTASSIUM CHLORIDE CRYS ER 20 MEQ PO TBCR: 40.0000 meq | EXTENDED_RELEASE_TABLET | Freq: Every day | ORAL | Status: DC

## 2022-01-02 MED ORDER — METHOCARBAMOL 500 MG PO TABS: 500.0000 mg | ORAL_TABLET | Freq: Three times a day (TID) | ORAL | Status: DC | PRN

## 2022-01-02 MED ORDER — LORAZEPAM 2 MG/ML IJ SOLN
1.0000 mg | Freq: Four times a day (QID) | INTRAMUSCULAR | Status: DC | PRN
Start: 2022-01-02 — End: 2022-01-02

## 2022-01-02 MED ORDER — DIPHENHYDRAMINE HCL 12.5 MG/5ML PO ELIX: 12.5000 mg | ORAL_SOLUTION | Freq: Four times a day (QID) | ORAL | Status: DC | PRN

## 2022-01-02 MED ORDER — LOPERAMIDE HCL 2 MG PO CAPS: 2.0000 mg | ORAL_CAPSULE | ORAL | Status: DC | PRN

## 2022-01-02 MED ORDER — CLONIDINE HCL 0.1 MG PO TABS: 0.1000 mg | ORAL_TABLET | Freq: Every day | ORAL | Status: DC

## 2022-01-02 MED ORDER — ONDANSETRON HCL 4 MG/2ML IJ SOLN
4.0000 mg | Freq: Once | INTRAMUSCULAR | Status: AC
Start: 2022-01-02 — End: 2022-01-02
  Administered 2022-01-02: 4 mg via INTRAVENOUS
  Filled 2022-01-02: qty 2

## 2022-01-02 MED ORDER — SODIUM CHLORIDE 0.9% FLUSH: 9.0000 mL | INTRAVENOUS | Status: DC | PRN

## 2022-01-02 MED ORDER — HYDROXYZINE HCL 25 MG PO TABS: 25.0000 mg | ORAL_TABLET | Freq: Four times a day (QID) | ORAL | Status: DC | PRN

## 2022-01-02 MED ORDER — NALOXONE HCL 0.4 MG/ML IJ SOLN: 0.4000 mg | INTRAMUSCULAR | Status: DC | PRN

## 2022-01-02 MED ORDER — DIPHENHYDRAMINE HCL 50 MG/ML IJ SOLN: 12.5000 mg | Freq: Four times a day (QID) | INTRAMUSCULAR | Status: DC | PRN

## 2022-01-02 MED ORDER — CLONIDINE HCL 0.1 MG PO TABS: 0.1000 mg | ORAL_TABLET | ORAL | Status: DC

## 2022-01-02 NOTE — ED Triage Notes (Addendum)
Pt presents to ED, pt left AMA from 4th floor x 2 hours ago but states she does not feel well. Pt endorses upper abdominal pain with N/V that began tonight.

## 2022-01-02 NOTE — ED Provider Notes (Signed)
[facility] DEPT Provider Note   CSN: [id number] Arrival date & time: 01/02/22  0501     History  Chief Complaint  Patient presents with   Abdominal Pain    Katrina [name] is a 31 y.o. female.  Patient as above with significant medical history as below, including IV drug use, polysubstance abuse, asthma, HSV, migraines who presents to the ED with complaint of abdominal pain.  Per chart review patient was admitted on 7/ 1 secondary to septic shock due to gram-positive bacteremia.  Initially on cefepime, Vanco, metronidazole.  Initially required vasopressors.  Vasopressors de-escalated.  Blood cultures positive for staph.  Patient was scheduled for TEE this morning however around 3 AM this morning patient became upset that her boyfriend was not allowed to visit her after hours and she left AMA.  Patient reports she began to feel significantly worse after leaving the hospital.  Nausea has returned.  Epigastric pain has returned.  She returned to the emergency room this morning because she would like to be readmitted to the hospital.  Last p.o. intake was yesterday evening  She denies using any illicit drugs this morning   Past Medical History:  Diagnosis Date   Asthma    Headache in pregnancy    Herpes simplex of female genitalia    last outbreak 4 months ago   Migraine    Migraines     Past Surgical History:  Procedure Laterality Date   CESAREAN SECTION N/A 03/27/2017   Procedure: White;  Surgeon: Jerelyn Charles, MD;  Location: [place];  Service: Obstetrics;  Laterality: N/A;   CHOLECYSTECTOMY N/A 01/17/2015   Procedure: LAPAROSCOPIC CHOLECYSTECTOMY WITH INTRAOPERATIVE CHOLANGIOGRAM;  Surgeon: Excell Seltzer, MD;  Location: [facility];  Service: General;  Laterality: N/A;   COLPOSCOPY  03/2014     The history is provided by the patient. No language interpreter was used.  Abdominal Pain Associated symptoms: fatigue and nausea    Associated symptoms: no chest pain, no chills, no cough, no fever, no hematuria, no shortness of breath and no vomiting        Home Medications Prior to Admission medications   Medication Sig Start Date End Date Taking? Authorizing Provider  famotidine (PEPCID) 20 MG tablet Take 1 tablet (20 mg total) by mouth 2 (two) times daily. Patient not taking: Reported on 10/05/2019 09/19/19   Robyn Haber, MD  loperamide (IMODIUM) 2 MG capsule Take 1 capsule (2 mg total) by mouth 4 (four) times daily as needed for diarrhea or loose stools. Patient not taking: Reported on 10/05/2019 09/19/19   Robyn Haber, MD  ondansetron (ZOFRAN-ODT) 8 MG disintegrating tablet Take 1 tablet (8 mg total) by mouth every 8 (eight) hours as needed for nausea. Patient not taking: Reported on 10/05/2019 09/19/19   Robyn Haber, MD  fluticasone [facility]) 50 MCG/ACT nasal spray Place 1 spray into both nostrils daily. 01/08/19 01/12/19  Petrucelli, Samantha R, PA-C      Allergies    Shellfish allergy, Other, and Penicillins    Review of Systems   Review of Systems  Constitutional:  Positive for fatigue. Negative for chills and fever.  HENT:  Negative for facial swelling and trouble swallowing.   Eyes:  Negative for photophobia and visual disturbance.  Respiratory:  Negative for cough and shortness of breath.   Cardiovascular:  Negative for chest pain and palpitations.  Gastrointestinal:  Positive for abdominal pain and nausea. Negative for vomiting.  Endocrine: Negative for polydipsia and polyuria.  Genitourinary:  Negative for difficulty urinating and hematuria.  Musculoskeletal:  Negative for gait problem and joint swelling.  Skin:  Negative for pallor and rash.  Neurological:  Negative for syncope and headaches.  Psychiatric/Behavioral:  Negative for agitation and confusion.     Physical Exam Updated Vital Signs BP 128/83   Pulse 66   Temp 98.4 F (36.9 C) (Oral)   Resp (!) 29   SpO2 100%  Physical  Exam Vitals and nursing note reviewed.  Constitutional:      General: She is not in acute distress.    Appearance: Normal appearance. She is well-developed. She is not ill-appearing or diaphoretic.  HENT:     Head: Normocephalic and atraumatic.     Right Ear: External ear normal.     Left Ear: External ear normal.     Nose: Nose normal.     Mouth/Throat:     Mouth: Mucous membranes are moist.  Eyes:     General: No scleral icterus.       Right eye: No discharge.        Left eye: No discharge.  Cardiovascular:     Rate and Rhythm: Normal rate and regular rhythm.     Pulses: Normal pulses.     Heart sounds: Normal heart sounds.  Pulmonary:     Effort: Pulmonary effort is normal. No respiratory distress.     Breath sounds: Normal breath sounds.  Abdominal:     General: Abdomen is flat.     Palpations: Abdomen is soft.     Tenderness: There is abdominal tenderness in the epigastric area. There is no guarding or rebound.     Comments: Abdomen is not peritoneal  Musculoskeletal:        General: Normal range of motion.     Cervical back: Normal range of motion.     Right lower leg: No edema.     Left lower leg: No edema.  Skin:    General: Skin is warm and dry.     Capillary Refill: Capillary refill takes less than 2 seconds.  Neurological:     Mental Status: She is alert and oriented to person, place, and time.     GCS: GCS eye subscore is 4. GCS verbal subscore is 5. GCS motor subscore is 6.  Psychiatric:        Mood and Affect: Mood normal.        Behavior: Behavior normal.     ED Results / Procedures / Treatments   Labs (all labs ordered are listed, but only abnormal results are displayed) Labs Reviewed  CBC WITH DIFFERENTIAL/PLATELET - Abnormal; Notable for the following components:      Result Value   Hemoglobin 11.6 (*)    HCT 33.5 (*)    All other components within normal limits  COMPREHENSIVE METABOLIC PANEL - Abnormal; Notable for the following components:    Potassium 3.3 (*)    Glucose, Bld 102 (*)    Calcium 8.0 (*)    Albumin 2.8 (*)    Alkaline Phosphatase 144 (*)    Total Bilirubin 1.3 (*)    All other components within normal limits  URINALYSIS, ROUTINE W REFLEX MICROSCOPIC - Abnormal; Notable for the following components:   Color, Urine AMBER (*)    Protein, ur 30 (*)    All other components within normal limits  RAPID URINE DRUG SCREEN, HOSP PERFORMED - Abnormal; Notable for the following components:   Opiates POSITIVE (*)    Amphetamines  POSITIVE (*)    All other components within normal limits  RESP PANEL BY RT-PCR (FLU A&B, COVID) ARPGX2  LIPASE, BLOOD  PREGNANCY, URINE    EKG None  Radiology No results found.  Procedures Procedures    Medications Ordered in ED Medications  lactated ringers bolus 1,000 mL (1,000 mLs Intravenous New Bag/Given 01/02/22 9295)  morphine (PF) 4 MG/ML injection 4 mg (4 mg Intravenous Given 01/02/22 0556)  ondansetron (ZOFRAN) injection 4 mg (4 mg Intravenous Given 01/02/22 0555)  famotidine (PEPCID) IVPB 20 mg premix (0 mg Intravenous Stopped 01/02/22 7473)    ED Course/ Medical Decision Making/ A&P                           Medical Decision Making Amount and/or Complexity of Data Reviewed Labs: ordered.  Risk Prescription drug management. Decision regarding hospitalization.    CC: epig pain, nausea  This patient presents to the Emergency Department for the above complaint. This involves an extensive number of treatment options and is a complaint that carries with it a high risk of complications and morbidity. Vital signs were reviewed. Serious etiologies considered.  Differential diagnosis includes but is not exclusive to acute cholecystitis, intrathoracic causes for epigastric abdominal pain, gastritis, duodenitis, pancreatitis, small bowel or large bowel obstruction, abdominal aortic aneurysm, hernia, gastritis, etc.  Record review:  Previous records obtained and reviewed recent  admission, prior labs and imaging   Additional history obtained from [place] and surgical history as noted above.   Work up as above, notable for:  Labs & imaging results that were available during my care of the patient were visualized by me and considered in my medical decision making.  Physical exam as above.   Labs reviewed hemoglobin 1.6, potassium 3.3, alk phos is elevated 144, T. bili 1.3.  UDS positive for opiates and amphetamines.  History of amphetamine abuse.  Patient was on Dilaudid PCA pump just prior to leaving AMA earlier this morning.  Pregnancy test negative.  Management: Given analgesics, IV fluids, antiemetic  ED Course:     Reassessment:  Symptoms improved.  Admission was considered.   Patient was recently admitted for septic shock, found to have gram-positive bacteremia.  Left AMA this morning.  Patient has since returned and would like to complete her treatment.  Recommend re-admission for continuation of treatment of her gram-positive bacteremia.  She was most recently on Ancef.  Patient is agreeable.  She is eager to complete her treatment at this time.  Spoke with hospitalist Dr. Sidney Ace regarding her readmission.  Accepts patient for admission.            Social determinants of health include -  Counseled patient for approximately 4 minutes regarding smoking cessation. Discussed risks of smoking and how they applied and affected their visit here today. Patient not ready to quit at this time, however will follow up with their primary doctor when they are.   CPT code: [id number]: intermediate counseling for smoking cessation   Social History   Socioeconomic History   Marital status: Single    Spouse name: Not on file   Number of children: 2   Years of education: 8 th   Highest education level: Not on file  Occupational History    Employer: UNEMPLOYED  Tobacco Use   Smoking status: Every Day    Packs/day: 0.25    Years: 2.00    Total pack  years: 0.50  Types: Cigarettes    Last attempt to quit: 05/02/2013    Years since quitting: 8.6   Smokeless tobacco: Never  Substance and Sexual Activity   Alcohol use: Not Currently    Alcohol/week: 0.0 standard drinks of alcohol    Comment: socially   Drug use: Yes    Frequency: 21.0 times per week    Types: Other-see comments, Amphetamines, Heroin    Comment: percocet; 3 per day per pt.    Sexual activity: Yes    Birth control/protection: None, Surgical  Other Topics Concern   Not on file  Social History Narrative   Patient is single and lives at home with her family.   Patient is unemployed.   Education 8 th grade.   Right handed.   Caffeine one cup of coffee daily.   Social Determinants of Health   Financial Resource Strain: Not on file  Food Insecurity: Not on file  Transportation Needs: Not on file  Physical Activity: Not on file  Stress: Not on file  Social Connections: Not on file  Intimate Partner Violence: Not on file      This chart was dictated using voice recognition software.  Despite best efforts to proofread,  errors can occur which can change the documentation meaning.         Final Clinical Impression(s) / ED Diagnoses Final diagnoses:  Epigastric pain  Bacteremia  Polysubstance abuse [facility])    Rx / DC Orders ED Discharge Orders     None         Jeanell Sparrow, DO 01/02/22 [phone]

## 2022-01-02 NOTE — H&P (Signed)
History and Physical    Patient: Katrina [name] IRW:[id number] DOB: 10-25-1990 DOA: 01/02/2022 DOS: the patient was seen and examined on 01/02/2022 PCP: Patient, No Pcp Per  Patient coming from: Home  Chief Complaint:  Chief Complaint  Patient presents with   Abdominal Pain   HPI: Katrina [name] is a 31 y.o. female with medical history significant of HSV, polysubstance abuse, migraine. Originally presenting to the [facility] for N/V and epigastric pain on 12/28/21. She was found to be in septic shock from polymicrobial bacteremia. She required a stay in the ICU. She was eventually transferred to the floor. She had a TEE that was pending for 01/03/22. She was not a candidate for home IV antibiotics, so she was held in the hospital. She left AMA this morning around 3am. She says she left because she "made a stupid decision." She does not elucidate further. She returned due to nausea and epigastric pain. She denies any other aggravating or alleviating factors.   Review of Systems: As mentioned in the history of present illness. All other systems reviewed and are negative. Past Medical History:  Diagnosis Date   Asthma    Headache in pregnancy    Herpes simplex of female genitalia    last outbreak 4 months ago   Migraine    Migraines    Past Surgical History:  Procedure Laterality Date   CESAREAN SECTION N/A 03/27/2017   Procedure: CESAREAN SECTION;  Surgeon: Jerelyn Charles, MD;  Location: [place];  Service: Obstetrics;  Laterality: N/A;   CHOLECYSTECTOMY N/A 01/17/2015   Procedure: LAPAROSCOPIC CHOLECYSTECTOMY WITH INTRAOPERATIVE CHOLANGIOGRAM;  Surgeon: Excell Seltzer, MD;  Location: [facility];  Service: General;  Laterality: N/A;   COLPOSCOPY  03/2014   Social History:  reports that she has been smoking cigarettes. She has a 0.50 pack-year smoking history. She has never used smokeless tobacco. She reports that she does not currently use alcohol. She reports current drug use. Frequency:  21.00 times per week. Drugs: Other-see comments, Amphetamines, and Heroin.  Allergies  Allergen Reactions   Shellfish Allergy Shortness Of Breath and Swelling    Tongue   Other     Tylenol #3---Swelling    Penicillins Itching, Swelling and Rash    Sweating, Has patient had a PCN reaction causing immediate rash, facial/tongue/throat swelling, SOB or lightheadedness with hypotension: NO Has patient had a PCN reaction causing severe rash involving mucus membranes or skin necrosis:NO Has patient had a PCN reaction that required hospitalization NO Has patient had a PCN reaction occurring within the last 10 years:NO If all of the above answers are "NO", then may proceed with Cephalosporin use.      Family History  Problem Relation Age of Onset   Asthma Mother    Asthma Sister    Asthma Sister     Prior to Admission medications   Medication Sig Start Date End Date Taking? Authorizing Provider  famotidine (PEPCID) 20 MG tablet Take 1 tablet (20 mg total) by mouth 2 (two) times daily. Patient not taking: Reported on 10/05/2019 09/19/19   Robyn Haber, MD  loperamide (IMODIUM) 2 MG capsule Take 1 capsule (2 mg total) by mouth 4 (four) times daily as needed for diarrhea or loose stools. Patient not taking: Reported on 10/05/2019 09/19/19   Robyn Haber, MD  ondansetron (ZOFRAN-ODT) 8 MG disintegrating tablet Take 1 tablet (8 mg total) by mouth every 8 (eight) hours as needed for nausea. Patient not taking: Reported on 10/05/2019 09/19/19   Lauenstein,  Synetta Shadow, MD  fluticasone [facility]) 50 MCG/ACT nasal spray Place 1 spray into both nostrils daily. 01/08/19 01/12/19  Petrucelli, Glynda Jaeger, PA-C    Physical Exam: Vitals:   01/02/22 0700 01/02/22 0715 01/02/22 0718 01/02/22 0730  BP: 120/76 116/63  116/73  Pulse: 69 81  81  Resp: 16 (!) 24  (!) 24  Temp:      TempSrc:      SpO2: 100% 100%  100%  Weight:   58.6 kg   Height:   '5\' 1"'  (1.549 m)    General: 31 y.o. female resting in bed in  NAD Eyes: PERRL, normal sclera ENMT: Nares patent w/o discharge, orophaynx clear, dentition normal, ears w/o discharge/lesions/ulcers Neck: Supple, trachea midline Cardiovascular: RRR, +S1, S2, no m/g/r, equal pulses throughout Respiratory: CTABL, no w/r/r, normal WOB GI: BS+, NDNT, no masses noted, no organomegaly noted MSK: No e/c/c Skin: multiple track marks noted Neuro: A&O x 3, no focal deficits Psyc: Appropriate interaction and affect, calm/cooperative  Data Reviewed:  Na+  137 K+  3.3 BUN  13 SCr  0.68 Alk phos  144 Albumin  2.8 T bili  1.3 WBC  8.2 Hgb  11.6 Plt  173  Assessment and Plan: Polymicrobial bacteremia Polysubstance abuse     - admitted to inpt, tele     - continue previous abx (ancef)     - will reconsult ID and cardiology (needs TEE)     - counseled against further drug use  Abdominal pain N/V     - received a page about 10/10 pain 2 minutes before I entered the room for her H&P; when I entered the room, she was calmly lying in bed and talking on the phone; while interviewing her, she continued to talk on the phone and was laughing and giggling; I took the opportunity to examine her while she was on the phone and with deep palpation to the stomach with my stethoscope in all four quadrants there was no response (no grims, flinch, guarding); I asked her to take a deep breath an palpated again -- no pain     - imaging from before her leaving AMA was negative     - this seems more like withdrawal     - clonidine withdrawal protocol     - she may have zofran and ativan for nausea     - we will not return her to the dilaudid pump and we will avoid IV narcotics  Hypokalemia     - replace K+  Normocytic anemia     - no evidence of bleed; follow  Hypoalbuminemia     - check prealbumin     - dietitian consult  Tobacco abuse     - nicotine patch  Advance Care Planning:   Code Status: FULL  Consults: ID, Cardiology  Family Communication: None at  bedside  Severity of Illness: The appropriate patient status for this patient is INPATIENT. Inpatient status is judged to be reasonable and necessary in order to provide the required intensity of service to ensure the patient's safety. The patient's presenting symptoms, physical exam findings, and initial radiographic and laboratory data in the context of their chronic comorbidities is felt to place them at high risk for further clinical deterioration. Furthermore, it is not anticipated that the patient will be medically stable for discharge from the hospital within 2 midnights of admission.   * I certify that at the point of admission it is my clinical judgment that the patient  will require inpatient hospital care spanning beyond 2 midnights from the point of admission due to high intensity of service, high risk for further deterioration and high frequency of surveillance required.*  Author: Jonnie Finner, DO 01/02/2022 7:33 AM  For on call review [email or web address].

## 2022-01-02 NOTE — Discharge Summary (Signed)
Physician [facility] Discharge Summary  Katrina [name] WJX:[id number] DOB: May 10, 1991 DOA: 12/28/2021  PCP: Patient, No Pcp Per  Admit date: 12/28/2021 Discharge date: 01/02/2022  Discharge Condition: guarded, left AMA CODE STATUS: Full   HPI: Per admitting MD, 31yF with history of asthma, HSV, migraine, heroin use, cholecystectomy who presented to [facility] ED this morning for nausea, vomiting and epigastric pain. After she used heroin, woke up feeling hot, nauseated. She says it overall feels just like it did when she required cholecystectomy in 2016. Here she was found to be in shock and received 4L total crystalloid boluses, started on LR MIVF, levo at 14 mcg/min, vanc/cefepime/flagyl with concern for sepsis and biliary source vs bacteremia vs pancreatitis. Femoral central line placed in ED. Otherwise pertinent review of systems is negative.  Hospital Course / Discharge diagnoses: Principal problem Septic shock due to polymicrobial bacteremia-blood cultures on 7/1 show Staphylococcus lugdunensis as well as epidermidis.  It is likely in the setting of underlying IV drug use.  ID consulted and following, currently on Ancef.  Shock physiology has resolved and she is off pressors now.  2D echo without any clear evidence of endocarditis, patient needed TEE which was shcheduled for 7/7. Surveillance culture 7/2 without growth.    Unfortunately after hours, patient decided to leave AMA, please see overnight progress notes.   Active problems Abdominal pain-unrevealing extensive work-up.  On Dilaudid PCA, initially was on ketamine drip but now has been off.    Oral herpes-on Zovirax ointment Elevated LFTs-in the setting of #1.  Monitor.  Improving Hyponatremia, hypokalemia, hypomagnesemia-continue to monitor and replete as indicated Thrombocytopenia-trend CBCs IVDU-TOC consult.  Not a candidate for home IV antibiotics    Discharge Instructions   Allergies as of 01/02/2022       Reactions   Shellfish  Allergy Shortness Of Breath, Swelling   Tongue   Other    Tylenol #3---Swelling    Penicillins Itching, Swelling, Rash   Sweating, Has patient had a PCN reaction causing immediate rash, facial/tongue/throat swelling, SOB or lightheadedness with hypotension: NO Has patient had a PCN reaction causing severe rash involving mucus membranes or skin necrosis:NO Has patient had a PCN reaction that required hospitalization NO Has patient had a PCN reaction occurring within the last 10 years:NO If all of the above answers are "NO", then may proceed with Cephalosporin use.        Medication List     ASK your doctor about these medications    famotidine 20 MG tablet Commonly known as: PEPCID Take 1 tablet (20 mg total) by mouth 2 (two) times daily.   loperamide 2 MG capsule Commonly known as: IMODIUM Take 1 capsule (2 mg total) by mouth 4 (four) times daily as needed for diarrhea or loose stools.   ondansetron 8 MG disintegrating tablet Commonly known as: ZOFRAN-ODT Take 1 tablet (8 mg total) by mouth every 8 (eight) hours as needed for nausea.         Consultations: ID PCCM  Procedures/Studies:  DG CHEST PORT 1 VIEW  Result Date: 12/29/2021 CLINICAL DATA:  Hypotension.  Sepsis. EXAM: PORTABLE CHEST 1 VIEW COMPARISON:  December 28, 2021 FINDINGS: The heart size and mediastinal contours are within normal limits. Decreased lung volumes are noted. Mild atelectasis is seen within the left lung base. There is no evidence of acute infiltrate, pleural effusion or pneumothorax. The visualized skeletal structures are unremarkable. IMPRESSION: Low lung volumes with mild left basilar atelectasis. Electronically Signed   By: Hoover Browns  Houston M.D.   On: 12/29/2021 19:43   CT Angio Abd/Pel w/ and/or w/o  Result Date: 12/29/2021 CLINICAL DATA:  Concern for mesenteric ischemia. Patient presented yesterday with abdominal pain. Found on the side of the road covered in feces and vomit. EXAM: CTA ABDOMEN  AND PELVIS WITHOUT AND WITH CONTRAST TECHNIQUE: Multidetector CT imaging of the abdomen and pelvis was performed using the standard protocol during bolus administration of intravenous contrast. Multiplanar reconstructed images and MIPs were obtained and reviewed to evaluate the vascular anatomy. RADIATION DOSE REDUCTION: This exam was performed according to the departmental dose-optimization program which includes automated exposure control, adjustment of the mA and/or kV according to patient size and/or use of iterative reconstruction technique. CONTRAST:  123m OMNIPAQUE IOHEXOL 350 MG/ML SOLN COMPARISON:  CT abdomen pelvis performed yesterday. FINDINGS: VASCULAR Aorta: Normal caliber aorta without aneurysm, dissection, vasculitis or significant stenosis. Celiac: Patent without evidence of aneurysm, dissection, vasculitis or significant stenosis. SMA: Patent without evidence of aneurysm, dissection, vasculitis or significant stenosis. Renals: Both renal arteries are patent without evidence of aneurysm, dissection, vasculitis, fibromuscular dysplasia or significant stenosis. IMA: Patent without evidence of aneurysm, dissection, vasculitis or significant stenosis. Inflow: Patent without evidence of aneurysm, dissection, vasculitis or significant stenosis. Femoral artery catheter lies in the right external iliac artery. Proximal Outflow: Bilateral common femoral and visualized portions of the superficial and profunda femoral arteries are patent without evidence of aneurysm, dissection, vasculitis or significant stenosis. Veins: No obvious venous abnormality within the limitations of this arterial phase study. Femoral venous catheter inserted in the right groin, tip in the right common iliac vein. Review of the MIP images confirms the above findings. NON-VASCULAR Lower chest: Small bilateral effusions with associated dependent lower lobe atelectasis, developing since the previous day's CT. Hepatobiliary: Liver mildly  enlarged, 22 cm transversely. Focal fat noted along the falciform ligament. Liver otherwise normal in attenuation. No mass or focal lesion. Status post cholecystectomy. No bile duct dilation. Periportal hypoattenuation noted on the previous day's CT has decreased. Pancreas: Pancreas normal attenuation. No mass or convincing inflammation. Fluid noted adjacent to the tail on the previous day's CT is less apparent. Spleen: Enlarged spleen, 17 cm from superior to inferior. No splenic mass or focal lesion. Adrenals/Urinary Tract: Adrenal glands are unremarkable. Kidneys are normal, without renal calculi, focal lesion, or hydronephrosis. Bladder is unremarkable. Stomach/Bowel: Normal stomach. Small bowel and colon are normal in caliber. No wall thickening. No inflammation. Normal appendix visualized. Lymphatic: No enlarged lymph nodes. Reproductive: Uterus and bilateral adnexa are unremarkable. Other: Trace amount of ascites increased from the previous day's study. Musculoskeletal: No acute or significant osseous findings. IMPRESSION: VASCULAR 1. Normal. Normal caliber aorta. No dissection. No atherosclerosis. Branch vessels are all widely patent. There are no findings that would suggest mesenteric ischemia. NON-VASCULAR 1. Mild hepatomegaly.  Splenomegaly. 2. Periportal edema noted on the previous day's exam has decreased. 3. Trace amount of ascites, increased from the prior exam. 4. No current evidence of pancreatitis. 5. Small bilateral effusions with associated dependent atelectasis, developing since the previous day's CT scan. 6. No evidence of bowel inflammation. Electronically Signed   By: DLajean ManesM.D.   On: 12/29/2021 11:19   ECHOCARDIOGRAM COMPLETE  Result Date: 12/29/2021    ECHOCARDIOGRAM REPORT   Patient Name:   MVANICE RAPPADate of Exam: 12/29/2021 Medical Rec #:  [id number]     Height:       61.0 in Accession #:    [id number]  Weight:       159.4 lb Date of Birth:  1991/05/07      BSA:           1.715 m Patient Age:    31 years       BP:           93/48 mmHg Patient Gender: F              HR:           112 bpm. Exam Location:  Inpatient Procedure: 2D Echo, Cardiac Doppler and Color Doppler Indications:    R06.02 SOB  History:        Patient has no prior history of Echocardiogram examinations.                 Risk Factors:Current Smoker. Shock. IVDU.  Sonographer:    Roseanna Rainbow RDCS Referring Phys: [id number] Hortencia Conradi [facility]  Sonographer Comments: Technically difficult study due to poor echo windows. IMPRESSIONS  1. Left ventricular ejection fraction, by estimation, is 60 to 65%. The left ventricle has normal function. The left ventricle has no regional wall motion abnormalities. Left ventricular diastolic parameters were normal.  2. Right ventricular systolic function is normal. The right ventricular size is normal. There is normal pulmonary artery systolic pressure. The estimated right ventricular systolic pressure is 50.3 mmHg.  3. The mitral valve is grossly normal. Mild mitral valve regurgitation.  4. The aortic valve is tricuspid. Aortic valve regurgitation is not visualized.  5. The inferior vena cava is normal in size with <50% respiratory variability, suggesting right atrial pressure of 8 mmHg. Comparison(s): No prior Echocardiogram. FINDINGS  Left Ventricle: Left ventricular ejection fraction, by estimation, is 60 to 65%. The left ventricle has normal function. The left ventricle has no regional wall motion abnormalities. The left ventricular internal cavity size was normal in size. There is  no left ventricular hypertrophy. Left ventricular diastolic parameters were normal. Right Ventricle: The right ventricular size is normal. No increase in right ventricular wall thickness. Right ventricular systolic function is normal. There is normal pulmonary artery systolic pressure. The tricuspid regurgitant velocity is 2.42 m/s, and  with an assumed right atrial pressure of 8 mmHg, the estimated right  ventricular systolic pressure is 54.6 mmHg. Left Atrium: Left atrial size was normal in size. Right Atrium: Right atrial size was normal in size. Pericardium: There is no evidence of pericardial effusion. Mitral Valve: The mitral valve is grossly normal. Mild mitral valve regurgitation. Tricuspid Valve: The tricuspid valve is grossly normal. Tricuspid valve regurgitation is trivial. Aortic Valve: The aortic valve is tricuspid. There is mild aortic valve annular calcification. Aortic valve regurgitation is not visualized. Pulmonic Valve: The pulmonic valve was grossly normal. Pulmonic valve regurgitation is trivial. Aorta: The aortic root is normal in size and structure. Venous: The inferior vena cava is normal in size with less than 50% respiratory variability, suggesting right atrial pressure of 8 mmHg. IAS/Shunts: No atrial level shunt detected by color flow Doppler.  LEFT VENTRICLE PLAX 2D LVIDd:         4.70 cm     Diastology LVIDs:         3.10 cm     LV e' medial:    14.00 cm/s LV PW:         0.90 cm     LV E/e' medial:  6.5 LV IVS:        0.80 cm     LV e' lateral:  15.60 cm/s LVOT diam:     2.00 cm     LV E/e' lateral: 5.8 LV SV:         63 LV SV Index:   37 LVOT Area:     3.14 cm  LV Volumes (MOD) LV vol d, MOD A2C: 78.8 ml LV vol d, MOD A4C: 84.0 ml LV vol s, MOD A2C: 31.4 ml LV vol s, MOD A4C: 31.1 ml LV SV MOD A2C:     47.3 ml LV SV MOD A4C:     84.0 ml LV SV MOD BP:      53.6 ml RIGHT VENTRICLE             IVC RV S prime:     15.00 cm/s  IVC diam: 2.10 cm TAPSE (M-mode): 2.0 cm LEFT ATRIUM           Index        RIGHT ATRIUM           Index LA diam:      3.10 cm 1.81 cm/m   RA Area:     10.10 cm LA Vol (A2C): 27.6 ml 16.09 ml/m  RA Volume:   20.70 ml  12.07 ml/m  AORTIC VALVE LVOT Vmax:   128.50 cm/s LVOT Vmean:  90.250 cm/s LVOT VTI:    0.201 m  AORTA Ao Root diam: 2.70 cm Ao Asc diam:  2.40 cm MITRAL VALVE               TRICUSPID VALVE MV Area (PHT): 5.54 cm    TR Peak grad:   23.4 mmHg MV  Decel Time: 137 msec    TR Vmax:        242.00 cm/s MV E velocity: 90.55 cm/s MV A velocity: 89.95 cm/s  SHUNTS MV E/A ratio:  1.01        Systemic VTI:  0.20 m                            Systemic Diam: 2.00 cm Rozann Lesches MD Electronically signed by Rozann Lesches MD Signature Date/Time: 12/29/2021/11:11:52 AM    Final    US LIVER DOPPLER  Result Date: 12/28/2021 CLINICAL DATA:  Right upper quadrant pain EXAM: DUPLEX ULTRASOUND OF LIVER TECHNIQUE: Color and duplex Doppler ultrasound was performed to evaluate the hepatic in-flow and out-flow vessels. COMPARISON:  Ultrasound from earlier in the same day. FINDINGS: Liver: Overall decreased echogenicity is noted. The nodularity appreciated on recent CT examination is not borne out on this exam. Area of focal fatty sparing is noted along the falciform ligament. Slight increased echogenicity is noted in the periportal regions which may be related to hepatic inflammatory change. No focal mass is noted. Main Portal Vein size: 1.2 cm Portal Vein Velocities Main Prox:  17.1 cm/sec Main Mid: 20.6 cm/sec Main Dist:  27.8 cm/sec Right: 19.7 cm/sec Left: 26.9 cm/sec Hepatic Vein Velocities Right:  16.4 cm/sec Middle:  22.5 cm/sec Left:  25.2 cm/sec IVC: Present and patent with normal respiratory phasicity. Hepatic Artery Velocity:  54.2 cm/sec Splenic Vein Velocity:  18.3 cm/sec Spleen: 13.5 cm x 6.9 cm x 13.3 cm with a total volume of 653 cm^3 (411 cm^3 is upper limit normal) Portal Vein Occlusion/Thrombus: No Splenic Vein Occlusion/Thrombus: No Ascites: None Varices: None Incidental note is made of small right pleural effusion. IMPRESSION: Normal waveforms and directions of the hepatic and portal veins. No portal hypertensive changes are seen. Mild  splenomegaly similar to that seen on prior CT. Small right pleural effusion. Changes consistent with periportal edema which may be related to underlying inflammatory change. These are similar to that seen on recent CT.  Nodularity seen on prior CT is not borne out on today's exam. Electronically Signed   By: Inez Catalina M.D.   On: 12/28/2021 22:07   US Abdomen Limited RUQ (LIVER/GB)  Result Date: 12/28/2021 CLINICAL DATA:  History of prior cholecystectomy, presenting with right upper quadrant tenderness. EXAM: ULTRASOUND ABDOMEN LIMITED RIGHT UPPER QUADRANT COMPARISON:  January 14, 2015 FINDINGS: Gallbladder: The gallbladder is surgically absent. Common bile duct: Diameter: 4.1 mm Liver: A 1.5 cm x 1.5 cm x 2.3 cm hypoechoic area is seen within the anterior aspect of the left lobe of the liver. No abnormal flow is seen within this region on color Doppler evaluation. Diffusely decreased echogenicity of the liver parenchyma is seen. Portal vein is patent on color Doppler imaging with normal direction of blood flow towards the liver. Other: Of incidental note is the presence of a right-sided pleural effusion. The study is limited secondary to motion artifact as well as the inability of the patient to take a deep breath. IMPRESSION: 1. Findings consistent with history of prior cholecystectomy. 2. Focal fatty infiltration suspected within the anterior aspect of the left lobe of the liver. Electronically Signed   By: Virgina Norfolk M.D.   On: 12/28/2021 21:40   DG CHEST PORT 1 VIEW  Result Date: 12/28/2021 CLINICAL DATA:  Left upper quadrant abdominal pain. EXAM: PORTABLE CHEST 1 VIEW COMPARISON:  One view chest x-ray 12/28/2021 at 3:12 a.m. FINDINGS: The heart size and mediastinal contours are within normal limits. Both lungs are clear. The visualized skeletal structures are unremarkable. IMPRESSION: No active disease. Electronically Signed   By: San Morelle M.D.   On: 12/28/2021 18:35   CT ABDOMEN PELVIS W CONTRAST  Result Date: 12/28/2021 CLINICAL DATA:  31 year old female with history of acute onset of nonlocalized abdominal pain. Patient was found on the side of the road covered in feces and vomit. EXAM: CT  ABDOMEN AND PELVIS WITH CONTRAST TECHNIQUE: Multidetector CT imaging of the abdomen and pelvis was performed using the standard protocol following bolus administration of intravenous contrast. RADIATION DOSE REDUCTION: This exam was performed according to the departmental dose-optimization program which includes automated exposure control, adjustment of the mA and/or kV according to patient size and/or use of iterative reconstruction technique. CONTRAST:  128m OMNIPAQUE IOHEXOL 300 MG/ML  SOLN COMPARISON:  CT the abdomen and pelvis 08/13/2016. FINDINGS: Lower chest: Unremarkable. Hepatobiliary: Slight nodular contour of the liver, suggests early changes of cirrhosis. Ill-defined area of low attenuation adjacent to the falciform ligament in segment 4B of the liver, similar to the prior examination, most compatible with a benign perfusion anomaly and/or focal fatty infiltration. No other suspicious appearing hepatic lesions. Diffuse periportal edema is noted. Status post cholecystectomy. No intra or extrahepatic biliary ductal dilatation. Pancreas: No pancreatic mass. No pancreatic ductal dilatation. Small amount of fluid adjacent to the distal body and tail of the pancreas. No well-defined peripancreatic fluid collections to suggest pseudocyst. Spleen: Spleen is enlarged measuring 10.1 x 5.4 x 17.7 cm (estimated splenic volume of 483 mL). Adrenals/Urinary Tract: Bilateral kidneys and adrenal glands are normal in appearance. No hydroureteronephrosis. Urinary bladder is unremarkable in appearance. Stomach/Bowel: The appearance of the stomach is unremarkable. No pathologic dilatation of small bowel or colon. Normal appendix. Vascular/Lymphatic: No significant atherosclerotic disease, aneurysm or dissection noted in  the abdominal or pelvic vasculature. No lymphadenopathy noted in the abdomen or pelvis. Reproductive: Uterus and ovaries are grossly unremarkable in appearance. Other: Ill-defined fluid attenuation in the  retroperitoneum most adjacent to the distal body and tail of the pancreas, and extending into the left perinephric region. No significant volume of ascites. No pneumoperitoneum. Musculoskeletal: There are no aggressive appearing lytic or blastic lesions noted in the visualized portions of the skeleton. IMPRESSION: 1. Fluid attenuation adjacent to the distal body and tail of the pancreas extending into the adjacent portions of the retroperitoneum, concerning for potential pancreatitis. 2. Diffuse periportal edema in the liver. This is nonspecific, but correlation with liver function tests is recommended. 3. Morphologic changes in the liver suggesting early cirrhosis. 4. Splenomegaly, which could be a sign of developing portal hypertension. 5. Additional incidental findings, as above. Electronically Signed   By: Vinnie Langton M.D.   On: 12/28/2021 06:12   CT Head Wo Contrast  Result Date: 12/28/2021 CLINICAL DATA:  Altered mental status EXAM: CT HEAD WITHOUT CONTRAST TECHNIQUE: Contiguous axial images were obtained from the base of the skull through the vertex without intravenous contrast. RADIATION DOSE REDUCTION: This exam was performed according to the departmental dose-optimization program which includes automated exposure control, adjustment of the mA and/or kV according to patient size and/or use of iterative reconstruction technique. COMPARISON:  05/10/2013 FINDINGS: Brain: No evidence of acute infarction, hemorrhage, hydrocephalus, extra-axial collection or mass lesion/mass effect. Vascular: No hyperdense vessel or unexpected calcification. Skull: Normal. Negative for fracture or focal lesion. Sinuses/Orbits: No acute finding. Other: None. IMPRESSION: No acute intracranial abnormality noted. Electronically Signed   By: Inez Catalina M.D.   On: 12/28/2021 03:43   DG Chest Port 1 View  Result Date: 12/28/2021 CLINICAL DATA:  Recent heroin use, found covered in vomit EXAM: PORTABLE CHEST 1 VIEW  COMPARISON:  01/29/2019 FINDINGS: The heart size and mediastinal contours are within normal limits. Both lungs are clear. The visualized skeletal structures are unremarkable. IMPRESSION: No active disease. Electronically Signed   By: Inez Catalina M.D.   On: 12/28/2021 03:41      The results of significant diagnostics from this hospitalization (including imaging, microbiology, ancillary and laboratory) are listed below for reference.     Microbiology: Recent Results (from the past 240 hour(s))  Blood Culture (routine x 2)     Status: None (Preliminary result)   Collection Time: 12/28/21  3:50 AM   Specimen: BLOOD  Result Value Ref Range Status   Specimen Description   Final    BLOOD LEFT ANTECUBITAL Performed at [facility] [address], [place], [place] [address]    Special Requests   Final    BOTTLES DRAWN AEROBIC AND ANAEROBIC Blood Culture adequate volume Performed at [facility] [address] [place], [place] [address]    Culture   Final    NO GROWTH 4 DAYS Performed at [facility], [place] [address] [place], [place] [address]    Report Status PENDING  Incomplete  Blood Culture (routine x 2)     Status: Abnormal   Collection Time: 12/28/21  3:50 AM   Specimen: BLOOD RIGHT WRIST  Result Value Ref Range Status   Specimen Description   Final    BLOOD RIGHT WRIST Performed at [facility], [address] [place], [place] [address]    Special Requests   Final    BOTTLES DRAWN AEROBIC AND ANAEROBIC Blood Culture adequate volume Performed at  [facility], [place] [address], [place], [place] [address]    Culture  Setup Time   Final    GRAM POSITIVE COCCI AEROBIC BOTTLE ONLY CRITICAL RESULT CALLED TO, READ BACK BY AND VERIFIED WITH: PHARMD MICHELLE BELL 12/29/21'@2'$ :47 BY TW Performed at [facility], [place] [address] [place], [place] [address]    Culture (A)  Final    STAPHYLOCOCCUS  LUGDUNENSIS STAPHYLOCOCCUS EPIDERMIDIS    Report Status 12/31/2021 FINAL  Final   Organism ID, Bacteria STAPHYLOCOCCUS LUGDUNENSIS  Final   Organism ID, Bacteria STAPHYLOCOCCUS EPIDERMIDIS  Final      Susceptibility   Staphylococcus epidermidis - MIC*    CIPROFLOXACIN <=0.5 SENSITIVE Sensitive     ERYTHROMYCIN >=8 RESISTANT Resistant     GENTAMICIN <=0.5 SENSITIVE Sensitive     OXACILLIN <=0.25 SENSITIVE Sensitive     TETRACYCLINE <=1 SENSITIVE Sensitive     VANCOMYCIN 1 SENSITIVE Sensitive     TRIMETH/SULFA <=10 SENSITIVE Sensitive     CLINDAMYCIN <=0.25 SENSITIVE Sensitive     RIFAMPIN <=0.5 SENSITIVE Sensitive     Inducible Clindamycin NEGATIVE Sensitive     * STAPHYLOCOCCUS EPIDERMIDIS   Staphylococcus lugdunensis - MIC*    CIPROFLOXACIN <=0.5 SENSITIVE Sensitive     ERYTHROMYCIN <=0.25 SENSITIVE Sensitive     GENTAMICIN <=0.5 SENSITIVE Sensitive     OXACILLIN 2 SENSITIVE Sensitive     TETRACYCLINE <=1 SENSITIVE Sensitive     VANCOMYCIN <=0.5 SENSITIVE Sensitive     TRIMETH/SULFA <=10 SENSITIVE Sensitive     CLINDAMYCIN <=0.25 SENSITIVE Sensitive     RIFAMPIN <=0.5 SENSITIVE Sensitive     Inducible Clindamycin NEGATIVE Sensitive     * STAPHYLOCOCCUS LUGDUNENSIS  Blood Culture ID Panel (Reflexed)     Status: Abnormal   Collection Time: 12/28/21  3:50 AM  Result Value Ref Range Status   Enterococcus faecalis NOT DETECTED NOT DETECTED Final   Enterococcus Faecium NOT DETECTED NOT DETECTED Final   Listeria monocytogenes NOT DETECTED NOT DETECTED Final   Staphylococcus species DETECTED (A) NOT DETECTED Final    Comment: CRITICAL RESULT CALLED TO, READ BACK BY AND VERIFIED WITH: PHARMD MICHELLE BELL 12/29/21'@2'$ :47 BY TW    Staphylococcus aureus (BCID) NOT DETECTED NOT DETECTED Final   Staphylococcus epidermidis DETECTED (A) NOT DETECTED Final    Comment: Methicillin (oxacillin) resistant coagulase negative staphylococcus. Possible blood culture contaminant (unless isolated from  more than one blood culture draw or clinical case suggests pathogenicity). No antibiotic treatment is indicated for blood  culture contaminants. CRITICAL RESULT CALLED TO, READ BACK BY AND VERIFIED WITH: PHARMD MICHELLE BELL 12/29/21'@2'$ :47 BY TW    Staphylococcus lugdunensis DETECTED (A) NOT DETECTED Final    Comment: Methicillin (oxacillin) resistant coagulase negative staphylococcus. Possible blood culture contaminant (unless isolated from more than one blood culture draw or clinical case suggests pathogenicity). No antibiotic treatment is indicated for blood  culture contaminants. CRITICAL RESULT CALLED TO, READ BACK BY AND VERIFIED WITH: PHARMD MICHELLE BELL 12/29/21'@2'$ :47 BY TW    Streptococcus species NOT DETECTED NOT DETECTED Final   Streptococcus agalactiae NOT DETECTED NOT DETECTED Final   Streptococcus pneumoniae NOT DETECTED NOT DETECTED Final   Streptococcus pyogenes NOT DETECTED NOT DETECTED Final   A.calcoaceticus-baumannii NOT DETECTED NOT DETECTED Final   Bacteroides fragilis NOT DETECTED NOT DETECTED Final   Enterobacterales NOT DETECTED NOT DETECTED Final   Enterobacter cloacae complex NOT DETECTED NOT DETECTED Final   Escherichia coli NOT DETECTED NOT DETECTED Final   Klebsiella aerogenes NOT DETECTED NOT  DETECTED Final   Klebsiella oxytoca NOT DETECTED NOT DETECTED Final   Klebsiella pneumoniae NOT DETECTED NOT DETECTED Final   Proteus species NOT DETECTED NOT DETECTED Final   Salmonella species NOT DETECTED NOT DETECTED Final   Serratia marcescens NOT DETECTED NOT DETECTED Final   Haemophilus influenzae NOT DETECTED NOT DETECTED Final   Neisseria meningitidis NOT DETECTED NOT DETECTED Final   Pseudomonas aeruginosa NOT DETECTED NOT DETECTED Final   Stenotrophomonas maltophilia NOT DETECTED NOT DETECTED Final   Candida albicans NOT DETECTED NOT DETECTED Final   Candida auris NOT DETECTED NOT DETECTED Final   Candida glabrata NOT DETECTED NOT DETECTED Final   Candida  krusei NOT DETECTED NOT DETECTED Final   Candida parapsilosis NOT DETECTED NOT DETECTED Final   Candida tropicalis NOT DETECTED NOT DETECTED Final   Cryptococcus neoformans/gattii NOT DETECTED NOT DETECTED Final   Methicillin resistance mecA/C DETECTED (A) NOT DETECTED Final    Comment: CRITICAL RESULT CALLED TO, READ BACK BY AND VERIFIED WITH: PHARMD MICHELLE BELL 12/29/21'@2'$ :47 BY TW Performed at [facility], [address] [place], [place] [address]   Urine Culture     Status: Abnormal   Collection Time: 12/28/21  4:01 AM   Specimen: In/Out Cath Urine  Result Value Ref Range Status   Specimen Description   Final    IN/OUT CATH URINE Performed at [facility] [address] [place], [place] [address]    Special Requests   Final    NONE Performed at [facility], [place] [address], [place], [place] [address]    Culture >=100,000 COLONIES/mL ESCHERICHIA COLI (A)  Final   Report Status 12/30/2021 FINAL  Final   Organism ID, Bacteria ESCHERICHIA COLI (A)  Final      Susceptibility   Escherichia coli - MIC*    AMPICILLIN 8 SENSITIVE Sensitive     CEFAZOLIN <=4 SENSITIVE Sensitive     CEFEPIME <=0.12 SENSITIVE Sensitive     CEFTRIAXONE <=0.25 SENSITIVE Sensitive     CIPROFLOXACIN <=0.25 SENSITIVE Sensitive     GENTAMICIN <=1 SENSITIVE Sensitive     IMIPENEM <=0.25 SENSITIVE Sensitive     NITROFURANTOIN <=16 SENSITIVE Sensitive     TRIMETH/SULFA <=20 SENSITIVE Sensitive     AMPICILLIN/SULBACTAM <=2 SENSITIVE Sensitive     PIP/TAZO <=4 SENSITIVE Sensitive     * >=100,000 COLONIES/mL ESCHERICHIA COLI  MRSA Next Gen by PCR, Nasal     Status: None   Collection Time: 12/28/21 10:05 AM   Specimen: Nasal Mucosa; Nasal Swab  Result Value Ref Range Status   MRSA by PCR Next Gen NOT DETECTED NOT DETECTED Final    Comment: (NOTE) The GeneXpert MRSA Assay (FDA approved for NASAL specimens only), is one component of a comprehensive MRSA  colonization surveillance program. It is not intended to diagnose MRSA infection nor to guide or monitor treatment for MRSA infections. Test performance is not FDA approved in patients less than 34 years old. Performed at [facility], [place] [address] [place], [place] [address]   Culture, blood (Routine X 2) w Reflex to ID Panel     Status: None (Preliminary result)   Collection Time: 12/29/21 11:14 AM   Specimen: BLOOD  Result Value Ref Range Status   Specimen Description   Final    BLOOD RIGHT ANTECUBITAL Performed at [facility] [address] [place], [place] [address]    Special Requests   Final    BOTTLES DRAWN AEROBIC AND  ANAEROBIC Blood Culture adequate volume Performed at [facility] [address] [place], [place] [address]    Culture   Final    NO GROWTH 3 DAYS Performed at [facility], [place] [address], [place], [place] [address]    Report Status PENDING  Incomplete  Culture, blood (Routine X 2) w Reflex to ID Panel     Status: None (Preliminary result)   Collection Time: 12/29/21 11:14 AM   Specimen: BLOOD  Result Value Ref Range Status   Specimen Description   Final    BLOOD SITE NOT SPECIFIED Performed at [facility] [address], [place], [place] [address]    Special Requests   Final    BOTTLES DRAWN AEROBIC AND ANAEROBIC Blood Culture results may not be optimal due to an inadequate volume of blood received in culture bottles Performed at [facility] [address] [place], [place] [address]    Culture   Final    NO GROWTH 3 DAYS Performed at [facility], [place] [address] [place], [place] [address]    Report Status PENDING  Incomplete     Labs: Basic Metabolic Panel: Recent Labs  Lab 12/28/21 0801 12/28/21 1230 12/29/21 0332 12/29/21 2104 12/30/21 0444 12/31/21 0252 01/01/22 0243  NA  --  140 143  --  141 142 140  K  --  2.9*  3.4*  --  3.3* 3.5 3.3*  CL  --  112* 117*  --  113* 115* 113*  CO2  --  19* 17*  --  22 18* 22  GLUCOSE  --  130* 84  --  86 78 95  BUN  --  24* 24*  --  '16 17 18  '$ CREATININE  --  1.76* 1.18*  --  0.65 0.69 0.52  CALCIUM  --  7.1* 7.3*  --  7.6* 7.6* 7.7*  MG 1.2*  --  1.1* 2.4  --   --   --    Liver Function Tests: Recent Labs  Lab 12/28/21 1848 12/29/21 0332 12/30/21 0444 12/31/21 0252 01/01/22 0243  AST '31 29 26 20 16  '$ ALT '23 23 22 20 19  '$ ALKPHOS 74 78 95 185* 162*  BILITOT 4.5* 5.2* 5.2* 3.0* 1.5*  PROT 5.1* 5.4* 5.5* 5.8* 6.1*  ALBUMIN 2.4* 2.5* 2.5* 2.6* 2.6*   CBC: Recent Labs  Lab 12/28/21 0350 12/28/21 1017 12/29/21 0332 12/30/21 0444 12/31/21 0252 01/02/22 0553  WBC 5.8  --  32.2* 15.9* 12.9* 8.2  NEUTROABS 5.3  --  28.0* 12.9* 11.2* PENDING  HGB 13.0  --  10.2* 9.2* 9.6* 11.6*  HCT 37.8  --  28.4* 26.6* 28.1* 33.5*  MCV 86.1  --  84.0 85.3 85.4 84.0  PLT 158 109* 106* 75* 83* 173   CBG: No results for input(s): "GLUCAP" in the last 168 hours. Hgb A1c No results for input(s): "HGBA1C" in the last 72 hours. Lipid Profile No results for input(s): "CHOL", "HDL", "LDLCALC", "TRIG", "CHOLHDL", "LDLDIRECT" in the last 72 hours. Thyroid function studies No results for input(s): "TSH", "T4TOTAL", "T3FREE", "THYROIDAB" in the last 72 hours.  Invalid input(s): "FREET3" Urinalysis    Component Value Date/Time   COLORURINE AMBER (A) 12/28/2021 0401   APPEARANCEUR CLOUDY (A) 12/28/2021 0401   LABSPEC 1.012 12/28/2021 0401   PHURINE 5.0 12/28/2021 0401   GLUCOSEU NEGATIVE 12/28/2021 0401   HGBUR SMALL (A) 12/28/2021 0401   BILIRUBINUR NEGATIVE 12/28/2021 0401  KETONESUR NEGATIVE 12/28/2021 0401   PROTEINUR >=300 (A) 12/28/2021 0401   UROBILINOGEN 0.2 05/27/2019 1825   NITRITE NEGATIVE 12/28/2021 0401   LEUKOCYTESUR TRACE (A) 12/28/2021 0401    FURTHER DISCHARGE INSTRUCTIONS:   Get Medicines reviewed and adjusted: Please take all your medications  with you for your next visit with your Primary MD   Laboratory/radiological data: Please request your Primary MD to go over all hospital tests and procedure/radiological results at the follow up, please ask your Primary MD to get all Hospital records sent to his/her office.   In some cases, they will be blood work, cultures and biopsy results pending at the time of your discharge. Please request that your primary care M.D. goes through all the records of your hospital data and follows up on these results.   Also Note the following: If you experience worsening of your admission symptoms, develop shortness of breath, life threatening emergency, suicidal or homicidal thoughts you must seek medical attention immediately by calling 911 or calling your MD immediately  if symptoms less severe.   You must read complete instructions/literature along with all the possible adverse reactions/side effects for all the Medicines you take and that have been prescribed to you. Take any new Medicines after you have completely understood and accpet all the possible adverse reactions/side effects.    Do not drive when taking Pain medications or sleeping medications (Benzodaizepines)   Do not take more than prescribed Pain, Sleep and Anxiety Medications. It is not advisable to combine anxiety,sleep and pain medications without talking with your primary care practitioner   Special Instructions: If you have smoked or chewed Tobacco  in the last 2 yrs please stop smoking, stop any regular Alcohol  and or any Recreational drug use.   Wear Seat belts while driving.   Please note: You were cared for by a hospitalist during your hospital stay. Once you are discharged, your primary care physician will handle any further medical issues. Please note that NO REFILLS for any discharge medications will be authorized once you are discharged, as it is imperative that you return to your primary care physician (or establish a  relationship with a primary care physician if you do not have one) for your post hospital discharge needs so that they can reassess your need for medications and monitor your lab values.  Time coordinating discharge: 10 minutes  SIGNED:  Marzetta Board, MD, PhD 01/02/2022, 6:25 AM

## 2022-01-02 NOTE — Progress Notes (Signed)
                                                  Against Medical Advice Patient at this time expresses desire to leave the Hospital immediately, patient has been warned that this is not Medically advisable at this time, and can result in Medical complications like Death and Disability, patient understands and accepts the risks involved and assumes full responsibilty of this decision.  This patient has also been advised that if they feel the need for further medical assistance to return to any available ER or dial 9-1-1.  Informed by Nursing staff that this patient has left care and has signed the form  Against Medical Advice on 01/02/2022 at  0256 Hrs.  Gershon Cull BSN MSNA MSN ACNPC-AG Acute Care Nurse Practitioner [facility]

## 2022-01-02 NOTE — Discharge Summary (Signed)
Against Medical Advice  Notified by nursing that the patient expresses desire to leave the hospital immediately. Reason given: None  The patient has been warned that this is not medically advisable, and can result in medical complications like death and disability. Despite this warning, the patient still desires to leave the hospital immediately. The patient has signed the [facility] form signifying understanding and acceptance of full responsibility of the risks involved involved in this decision.    This patient has also been advised that if they feel the need for further medical assistance to return to any available ER or dial 9-1-1.   Informed by Nursing staff that this patient has left care and has signed the form Against Medical Advice on 01/02/22 at 0803 hrs.    Final diagnosis: Polymicrobial bacteremia Polysubstance abuse Abdominal pain N/V Hypokalemia Normocytic anemia Hypoalbuminemia Tobacco abuse  Jonnie Finner, DO

## 2022-01-02 NOTE — ED Notes (Signed)
Spoke with MD Marylyn Ishihara and made him aware of her AMA

## 2022-01-02 NOTE — Progress Notes (Signed)
AMA at 0256  0245 pt express desire to leave AMA due to inability to have visitor (pt boyfriend) come up to room after visitation hours. Visitation hour policy explained to pt. Pt stated understanding but remains adamant about leaving AMA.   PCA Dilaudid was in progress throughout this shift and stopped at 0300 when pt pulled IV from arm.   All personal belongings  (cell phone and jewelry) given back to pt.   3 mL of PCA - Dilaudid IV wasted, witnessed by Derry Skill RN   On call APP and [facility] notified of events. AMA paper being signed by pt and filed.  Patient Alert and oriented x4 throughout this shift.

## 2022-01-02 NOTE — ED Notes (Signed)
When rounding on patient, she said she no longer wanted to stay in hospital after speaking with admitting. Explained the risk of leaving AMA to patient and patient still insisted on leaving. Patient states she has a ride home. IV removed and patient signed out AMA.

## 2022-01-02 NOTE — Progress Notes (Addendum)
Informed patient of visitation  hours. Patient became upset secondary to not be given approval for a visitor. Patient immediately stated she has to leave.  She was very determined. On call attending aware. Patient is alert and oriented x 4. Signed AMA paper work. Form filed.

## 2022-01-02 NOTE — Progress Notes (Signed)
BRIEF PHARMACY MONITORING NOTE  Jamita Mckelvin is a 31 yo female who presented with abdominal pain.  Pharmacy was consulted to dose cefazolin for polymicrobial bacteremia.  Patient has since left [facility] ED against medical advice.  Cefazolin consult will be discontinued.  Thank you for allowing pharmacy to be part of this patient's care.  Dimple Nanas, PharmD 01/02/2022 8:27 AM

## 2022-01-02 NOTE — ED Notes (Signed)
Patient requesting pain medication at this time there are no PRN medications, so paged Dr Marylyn Ishihara

## 2022-01-03 LAB — CULTURE, BLOOD (ROUTINE X 2)
Culture: NO GROWTH
Culture: NO GROWTH
Special Requests: ADEQUATE

## 2022-01-03 SURGERY — ECHOCARDIOGRAM, TRANSESOPHAGEAL
Anesthesia: Monitor Anesthesia Care

## 2022-01-06 ENCOUNTER — Other Ambulatory Visit: Payer: Self-pay

## 2022-01-06 ENCOUNTER — Encounter: Payer: Self-pay | Admitting: Student in an Organized Health Care Education/Training Program

## 2022-01-06 ENCOUNTER — Emergency Department
Admission: EM | Admit: 2022-01-06 | Discharge: 2022-01-06 | Disposition: A | Discharge status: Home / Self Care | Payer: MEDICAID | Source: Ambulatory Visit | Attending: Emergency Medicine | Admitting: Emergency Medicine

## 2022-01-06 DIAGNOSIS — F1193 Opioid use, unspecified with withdrawal: Secondary | ICD-10-CM | POA: Insufficient documentation

## 2022-01-06 LAB — HOLD LAVENDER

## 2022-01-06 LAB — HOLD SST

## 2022-01-06 LAB — HOLD BLUE

## 2022-01-06 LAB — HOLD GREEN NO GEL

## 2022-01-06 MED ORDER — ACETAMINOPHEN 325 MG PO TABS *I*
650.0000 mg | ORAL_TABLET | Freq: Four times a day (QID) | ORAL | Status: DC | PRN
Start: 2022-01-06 — End: 2022-01-06
  Filled 2022-01-06: qty 2

## 2022-01-06 MED ORDER — SODIUM CHLORIDE 0.9 % IV BOLUS *I*
1000.0000 mL | Freq: Once | Status: AC
Start: 2022-01-06 — End: 2022-01-06
  Administered 2022-01-06: 1000 mL via INTRAVENOUS

## 2022-01-06 MED ORDER — ONDANSETRON HCL 2 MG/ML IV SOLN *I*
4.0000 mg | Freq: Once | INTRAMUSCULAR | Status: AC
Start: 2022-01-06 — End: 2022-01-06
  Administered 2022-01-06: 4 mg via INTRAVENOUS
  Filled 2022-01-06: qty 2

## 2022-01-06 MED ORDER — KETOROLAC TROMETHAMINE 30 MG/ML IJ SOLN *I*
15.0000 mg | Freq: Four times a day (QID) | INTRAMUSCULAR | Status: DC | PRN
Start: 2022-01-06 — End: 2022-01-06
  Administered 2022-01-06: 15 mg via INTRAVENOUS
  Filled 2022-01-06: qty 1

## 2022-01-06 MED ORDER — CLONIDINE HCL 0.1 MG PO TABS *I*
0.1000 mg | ORAL_TABLET | Freq: Once | ORAL | Status: AC
Start: 2022-01-06 — End: 2022-01-06
  Administered 2022-01-06: 0.1 mg via ORAL
  Filled 2022-01-06: qty 1

## 2022-01-06 NOTE — Progress Notes (Signed)
Social Work Note    Information: SW contacted regarding SUD placement for pt. SW spoke with [facility] who stated they can accept pt after 8am.   Pt does not appear in [facility] portal. SW completed cab voucher for 8am pickup with Roxie Ph.#[phone] to [facility].    Vernie Shanks, LMSW  [phone]

## 2022-01-06 NOTE — ED Triage Notes (Signed)
PT presents with C/O withdrawal from heroin.  PT reports last use was yesterday.  PT denies SI/HI.  PT calm and cooperative in triage.    Prehospital medications given: No

## 2022-01-06 NOTE — Discharge Instructions (Addendum)
You were evaluated for a complaint of opiate withdrawal. You were provided symptomatic control. Social Workers were able to arrange detox/withdrawal program for you at [facility] this morning. You have been discharged to and provided transportation to [facility] to begin your treatment program.     We also discussed your recent hospitalization in [place] and the possibility of staying in the hospital for further medical evaluation today.  You preferred to be discharged with plan for [facility] evaluation for detox today as opposed to staying in the hospital for further medical evaluation or work-up of infection.  Expressed understanding of the risks of this and expressed understanding that you may return at any time for further evaluation.    Please return to the emergency department immediately with any new or worsening concerns including but not limited to fevers or chest pain.

## 2022-01-06 NOTE — ED Provider Progress Notes (Signed)
ED Provider Progress Note    Patient signed out to me by previous provider. Briefly, patient is a 31 y.o. female with history of opiate use who presents with withdrawal (last use fentanyl last night).    Workup today notable for: getting fluids, meds, SW consult for detox placement.    Signed out pending: SW, reassessment    ED Course as of 01/06/22 0806   Mon Jan 06, 2022   1610 Care of patient assumed from prior provider, Dr. Jenene Slicker, pending social work assistance for detox. Patient is currently sitting in chair, has generalized body aches. Also states she was recently hospitalized for four days in [place] for blood stream infection and concern for infection around her heart, states the medical team wanted to do imaging of her heart and possibly surgery, got antibiotics via IV, but left AMA on 6/5 in order to come to [place] and be closer to family. Did not get antibiotics at time of leaving hospital. States her sister is her biggest support. Discussed possibility of staying in the hospital today to continue workup for bloodstream/heart infection, but patient states she might just want to go to detox first. Has had generalized body aches and subjective fevers and chills recently, but afebrile here. Records unfortunately not available in care everywhere from OSH. She would like to discuss with her sister before making that decision. RN will help patient call sister to discuss.    9604 A long conversation with the patient and her sister, Myrene Buddy, who is at bedside.  The patient expressed understanding of the seriousness of her recent hospitalization for infection and the risks of not staying in the hospital for further evaluation of this, but would prefer to be discharged and go to [facility] for detox at this time as opposed to staying in the hospital.  She expressed understanding that she may return at any time and expressed understanding of careful return precautions, but would still like to go to detox  today as that was her primary motivation for coming to the emergency department tonight.  Patient's sister is her main support and plans to follow the patient to [facility] today.  Patient appears clinically sober at this time and in no distress.  Will discharge, given strict return precautions.        Darcey Nora, MD, 01/06/2022, 7:34 AM     Edythe Riches, Faye Ramsay, MD  01/06/22 [phone]

## 2022-01-06 NOTE — ED Provider Notes (Signed)
History     Chief Complaint   Patient presents with   . Withdrawal     31 year old female with past medical history of opiate use, presented with complaint of feeling that she is in withdrawal.  Patient reports that she normally uses approximately 2 g of fentanyl daily, with her last use being approximately 8 PM last night.  She feels like she is having abdominal cramps, body aches, and feels cold.  She would like to try to quit using opiates.  States that back in 2018, she was on methadone, but relapsed and has been using opiates since that time.  She otherwise denies any fevers or chills.  No chest pain or shortness of breath.  Has not made any contact with any local treatment program.  Cannot give me any specific reason as to why she wants to try to quit this morning.            Medical/Surgical/Family History     No past medical history on file.     There is no problem list on file for this patient.           No past surgical history on file.  No family history on file.          Living Situation     Questions Responses    Patient lives with     Homeless     Caregiver for other family member     External Services     Employment     Domestic Violence Risk                 Review of Systems   Review of Systems    Physical Exam     Triage Vitals  Triage Start: Start, (01/06/22 2956)  First Recorded BP: 119/67, Resp: 16, Temp: 36.1 C (97 F), Temp src: TEMPORAL Oxygen Therapy SpO2: 98 %, O2 Device: None (Room air), Heart Rate: 92, (01/06/22 0606)  .  First Pain Reported  0-10 Scale: 0, (01/06/22 2130)       Physical Exam  Vitals and nursing note reviewed.   Constitutional:       Appearance: Normal appearance.   HENT:      Head: Atraumatic.      Nose: No rhinorrhea.   Eyes:      Extraocular Movements: Extraocular movements intact.   Cardiovascular:      Rate and Rhythm: Normal rate and regular rhythm.      Heart sounds: No murmur heard.  Pulmonary:      Effort: Pulmonary effort is normal.      Breath sounds:  Normal breath sounds.   Abdominal:      Tenderness: There is no abdominal tenderness.   Musculoskeletal:         General: No deformity.   Skin:     General: Skin is warm.      Findings: Bruising present.   Neurological:      General: No focal deficit present.      Mental Status: She is alert and oriented to person, place, and time.   Psychiatric:         Mood and Affect: Mood normal.         Behavior: Behavior normal.         Medical Decision Making   Patient seen by me on:  01/06/2022    Assessment:  31 year old female with history of opiate use disorder, presented with complaint of feeling that she is in  withdrawal.  Uses a significant amount of fentanyl daily, up to 2 g.  Last used 11 hours ago.  Previously has been on methadone, however that is been almost 5 years.  She is interested in any type of program/treatment options available to her that could help her control her opiate use disorder.  No associated signs or symptoms to suggest that she has endocarditis, or other infection.    Differential diagnosis:  Opiate use disorder, and mild withdrawal,    Plan:  Patient will receive symptomatic control including clonidine, Zofran, and Toradol and Tylenol for body aches and symptoms.  As she has used opiates within the last 24 hours, she is not a candidate at this point for Suboxone, as do feel that would precipitate worsening withdrawal.  Social work has been contacted to inquire about detox and rehab options that are available to the patient.  Once a plan is available, she will patient will be presented with options.  In the interim, patient be signed out to the oncoming provider for reassessment of her symptom control, and awaiting information from social work.                Noah Delaine, MD             Noah Delaine, MD  01/06/22 [phone]

## 2022-01-07 LAB — UNMAPPED LAB RESULTS
Basophil # (HT): 0 10 3/uL (ref 0.0–0.2)
Basophil % (HT): 0 % (ref 0–3)
Eosinophil # (HT): 0 10 3/uL (ref 0.0–0.6)
Eosinophil % (HT): 0 % (ref 0–5)
HIV 1&2 ANTIGEN/ANTIBODY (HT): NONREACTIVE
Hematocrit (HT): 39 % (ref 35–47)
Hemoglobin (HGB) (HT): 13.5 g/dL (ref 12.0–16.0)
Lymphocyte # (HT): 1.1 10 3/uL (ref 1.0–4.8)
Lymphocyte % (HT): 13 % — ABNORMAL LOW (ref 15–45)
MCHC (HT): 34.4 g/dL (ref 31.0–37.5)
MCV (HT): 84 fL (ref 80–100)
Mean Corpuscular Hemoglobin (MCH) (HT): 28.8 pg (ref 26.0–34.0)
Monocyte # (HT): 0.5 10 3/uL (ref 0.1–1.0)
Monocyte % (HT): 6 % (ref 0–15)
Neutrophil # (HT): 6.7 10 3/uL (ref 1.8–8.0)
Platelets (HT): 529 10 3/uL — ABNORMAL HIGH (ref 150–450)
RBC (HT): 4.68 10 6/uL (ref 3.80–5.20)
RDW (HT): 13.8 % (ref 0.0–15.2)
Seg Neut % (HT): 79 % — ABNORMAL HIGH (ref 45–75)
WBC (HT): 8.4 10 3/uL (ref 4.0–11.0)

## 2022-01-08 LAB — UNMAPPED LAB RESULTS
Basophil # (HT): 0 10 3/uL (ref 0.0–0.2)
Basophil % (HT): 0 % (ref 0–3)
Eosinophil # (HT): 0 10 3/uL (ref 0.0–0.6)
Eosinophil % (HT): 0 % (ref 0–5)
Hematocrit (HT): 39 % (ref 35–47)
Hemoglobin (HGB) (HT): 12.8 g/dL (ref 12.0–16.0)
Lymphocyte # (HT): 1.1 10 3/uL (ref 1.0–4.8)
Lymphocyte % (HT): 17 % (ref 15–45)
MCHC (HT): 33.2 g/dL (ref 31.0–37.5)
MCV (HT): 86 fL (ref 80–100)
Mean Corpuscular Hemoglobin (MCH) (HT): 28.6 pg (ref 26.0–34.0)
Monocyte # (HT): 0.4 10 3/uL (ref 0.1–1.0)
Monocyte % (HT): 7 % (ref 0–15)
Neutrophil # (HT): 4.6 10 3/uL (ref 1.8–8.0)
Platelets (HT): 477 10 3/uL — ABNORMAL HIGH (ref 150–450)
RBC (HT): 4.48 10 6/uL (ref 3.80–5.20)
RDW (HT): 13.6 % (ref 0.0–15.2)
Seg Neut % (HT): 75 % (ref 45–75)
WBC (HT): 6.1 10 3/uL (ref 4.0–11.0)

## 2022-01-09 LAB — UNMAPPED LAB RESULTS
Hematocrit (HT): 34 % — ABNORMAL LOW (ref 35–47)
Hemoglobin (HGB) (HT): 11.7 g/dL — ABNORMAL LOW (ref 12.0–16.0)
MCHC (HT): 34.4 g/dL (ref 31.0–37.5)
MCV (HT): 84 fL (ref 80–100)
Mean Corpuscular Hemoglobin (MCH) (HT): 29 pg (ref 26.0–34.0)
Platelets (HT): 416 10 3/uL (ref 150–450)
RBC (HT): 4.04 10 6/uL (ref 3.80–5.20)
RDW (HT): 13.8 % (ref 0.0–15.2)
WBC (HT): 4.2 10 3/uL (ref 4.0–11.0)

## 2022-01-10 LAB — UNMAPPED LAB RESULTS
Hematocrit (HT): 37 % (ref 35–47)
Hemoglobin (HGB) (HT): 13 g/dL (ref 12.0–16.0)
MCHC (HT): 35.3 g/dL (ref 31.0–37.5)
MCV (HT): 83 fL (ref 80–100)
Mean Corpuscular Hemoglobin (MCH) (HT): 29.2 pg (ref 26.0–34.0)
Platelets (HT): 382 10 3/uL (ref 150–450)
RBC (HT): 4.45 10 6/uL (ref 3.80–5.20)
RDW (HT): 13.7 % (ref 0.0–15.2)
WBC (HT): 4.7 10 3/uL (ref 4.0–11.0)

## 2022-01-11 LAB — UNMAPPED LAB RESULTS
Hematocrit (HT): 34 % — ABNORMAL LOW (ref 35–47)
Hemoglobin (HGB) (HT): 12 g/dL (ref 12.0–16.0)
MCHC (HT): 35 g/dL (ref 31.0–37.5)
MCV (HT): 83 fL (ref 80–100)
Mean Corpuscular Hemoglobin (MCH) (HT): 29.1 pg (ref 26.0–34.0)
Platelets (HT): 410 10 3/uL (ref 150–450)
RBC (HT): 4.12 10 6/uL (ref 3.80–5.20)
RDW (HT): 13.5 % (ref 0.0–15.2)
WBC (HT): 3.4 10 3/uL — ABNORMAL LOW (ref 4.0–11.0)

## 2022-01-12 LAB — UNMAPPED LAB RESULTS
Hematocrit (HT): 35 % (ref 35–47)
Hemoglobin (HGB) (HT): 11.6 g/dL — ABNORMAL LOW (ref 12.0–16.0)
MCHC (HT): 33.5 g/dL (ref 31.0–37.5)
MCV (HT): 86 fL (ref 80–100)
Mean Corpuscular Hemoglobin (MCH) (HT): 28.9 pg (ref 26.0–34.0)
Platelets (HT): 405 10 3/uL (ref 150–450)
RBC (HT): 4.02 10 6/uL (ref 3.80–5.20)
RDW (HT): 13.5 % (ref 0.0–15.2)
WBC (HT): 3.6 10 3/uL — ABNORMAL LOW (ref 4.0–11.0)

## 2022-01-13 LAB — UNMAPPED LAB RESULTS
Hematocrit (HT): 29 % — ABNORMAL LOW (ref 35–47)
Hemoglobin (HGB) (HT): 9.8 g/dL — ABNORMAL LOW (ref 12.0–16.0)
MCHC (HT): 33.6 g/dL (ref 31.0–37.5)
MCV (HT): 86 fL (ref 80–100)
Mean Corpuscular Hemoglobin (MCH) (HT): 28.8 pg (ref 26.0–34.0)
Platelets (HT): 325 10 3/uL (ref 150–450)
RBC (HT): 3.4 10 6/uL — ABNORMAL LOW (ref 3.80–5.20)
RDW (HT): 13.4 % (ref 0.0–15.2)
WBC (HT): 3.1 10 3/uL — ABNORMAL LOW (ref 4.0–11.0)

## 2022-01-15 LAB — UNMAPPED LAB RESULTS
HIV 1&2 ANTIGEN/ANTIBODY (HT): NONREACTIVE
Hematocrit (HT): 36 % (ref 35–47)
Hemoglobin (HGB) (HT): 12.6 g/dL (ref 12.0–16.0)
MCHC (HT): 34.8 g/dL (ref 31.0–37.5)
MCV (HT): 84 fL (ref 80–100)
Mean Corpuscular Hemoglobin (MCH) (HT): 29.3 pg (ref 26.0–34.0)
Platelets (HT): 404 10 3/uL (ref 150–450)
RBC (HT): 4.3 10 6/uL (ref 3.80–5.20)
RDW (HT): 13.2 % (ref 9.0–15.2)
WBC (HT): 4.3 10 3/uL (ref 4.0–11.0)

## 2023-01-22 ENCOUNTER — Emergency Department: Payer: No Typology Code available for payment source | Admitting: Radiology

## 2023-01-22 ENCOUNTER — Other Ambulatory Visit: Payer: Self-pay

## 2023-01-22 ENCOUNTER — Emergency Department
Admission: EM | Admit: 2023-01-22 | Discharge: 2023-01-22 | Disposition: A | Discharge status: Home / Self Care | Payer: No Typology Code available for payment source | Source: Ambulatory Visit | Attending: Emergency Medicine | Admitting: Emergency Medicine

## 2023-01-22 DIAGNOSIS — R0789 Other chest pain: Secondary | ICD-10-CM | POA: Insufficient documentation

## 2023-01-22 DIAGNOSIS — Z789 Other specified health status: Secondary | ICD-10-CM

## 2023-01-22 DIAGNOSIS — R079 Chest pain, unspecified: Secondary | ICD-10-CM

## 2023-01-22 DIAGNOSIS — R0602 Shortness of breath: Secondary | ICD-10-CM

## 2023-01-22 DIAGNOSIS — F17210 Nicotine dependence, cigarettes, uncomplicated: Secondary | ICD-10-CM | POA: Insufficient documentation

## 2023-01-22 HISTORY — DX: Unspecified asthma, uncomplicated: J45.909

## 2023-01-22 LAB — CBC AND DIFFERENTIAL
Baso # K/uL: 0 10*3/uL (ref 0.0–0.2)
Eos # K/uL: 0.3 10*3/uL (ref 0.0–0.5)
Hematocrit: 36 % (ref 34–49)
Hemoglobin: 12.2 g/dL (ref 11.2–16.0)
Lymph # K/uL: 2.5 10*3/uL (ref 1.0–5.0)
MCV: 89 fL (ref 75–100)
Mono # K/uL: 0.5 10*3/uL (ref 0.1–1.0)
Neut # K/uL: 3 10*3/uL (ref 1.5–6.5)
Platelets: 223 10*3/uL (ref 150–450)
RBC: 4 MIL/uL (ref 4.0–5.5)
RDW: 13.2 % (ref 0.0–15.0)
Seg Neut %: 47.9 %
WBC: 6.4 10*3/uL (ref 3.5–11.0)

## 2023-01-22 LAB — BASIC METABOLIC PANEL
Anion Gap: 10 (ref 7–16)
CO2: 24 mmol/L (ref 20–28)
Calcium: 8.9 mg/dL (ref 8.8–10.2)
Chloride: 104 mmol/L (ref 96–108)
Creatinine: 0.86 mg/dL (ref 0.51–0.95)
Glucose: 101 mg/dL — ABNORMAL HIGH (ref 60–99)
Lab: 15 mg/dL (ref 6–20)
Potassium: 4.2 mmol/L (ref 3.3–5.1)
Sodium: 138 mmol/L (ref 133–145)
eGFR BY CREAT: 91 *

## 2023-01-22 LAB — TROPONIN T 0 HR HIGH SENSITIVITY (IP/ED ONLY): TROP T 0 HR High Sensitivity: <6 ng/L (ref 0–11)

## 2023-01-22 LAB — D-DIMER, QUANTITATIVE: D-Dimer: 0.41 ug/mL FEU (ref 0.00–0.50)

## 2023-01-22 MED ORDER — FAMOTIDINE 20 MG PO TABS *I*
20.0000 mg | ORAL_TABLET | Freq: Two times a day (BID) | ORAL | 0 refills | Status: AC
Start: 2023-01-22 — End: 2023-02-21

## 2023-01-22 MED ORDER — ASPIRIN 81 MG PO CHEW *I*
324.0000 mg | CHEWABLE_TABLET | Freq: Once | ORAL | Status: AC
Start: 2023-01-22 — End: 2023-01-22
  Administered 2023-01-22: 324 mg via ORAL
  Filled 2023-01-22: qty 4

## 2023-01-22 NOTE — Discharge Instructions (Signed)
You were seen in the Emergency Department for chest pain.   You were diagnosed with possible esophagitis.    Instructions: Follow up with your primary doctor if this continues. Avoid fatty, spicy foods, and limit caffiene and alcohol as much as possible.       Medications: Take tylenol 500mg  every 6 hours for pain.     Take the pepcid as prescribed.     If you develop any new or concerning symptoms, please consult with your primary care physician. Also please return to the ED for reevaluation at anytime you feel the condition warrants such a visit or you are unable contact your primary care doctor.

## 2023-01-22 NOTE — ED Notes (Signed)
Assumed care of pt, agree with triage note. Call bell in reach.  Nursing Care Plan:  Will monitor and assess VS and pain scores every 2-4 hours and prn.  Perform frequent rounding prn.  Provide updates to patient and/or cargiver frequently.  Provide support to patient/caregiver as needed.  Teach patient and/or caregivers about patients needs/status working towards discharge.  Patient oriented to room and given call bell.

## 2023-01-22 NOTE — ED Triage Notes (Signed)
Pt with complaint with left sided chest pain that began 3 days ago. Pt states shortness of breath began earlier today. Had some lightheadedness yesterday. Denies cardiac history. No medications for symptoms.     Prehospital medications given: No

## 2023-01-22 NOTE — ED Notes (Signed)
Report Given To  Abby, RN      Descriptive Sentence / Reason for Admission   Pt with complaint with left sided chest pain that began 3 days ago. Pt states shortness of breath began earlier today. Had some lightheadedness yesterday. Denies cardiac history. No medications for symptoms.           Active Issues / Relevant Events           To Do List  1 hour troponin at 2028      Anticipatory Guidance / Discharge Planning    Pending

## 2023-01-22 NOTE — ED Provider Notes (Signed)
History     Chief Complaint   Patient presents with    Chest Pain     32 yo female with 3 days of chest pain, now shortness of breath. Light-headed and sweaty last night. No fever or ill symptoms. Pain is left center of the chest, sharp, no radiation. She has not taken anything for pain. No associated with food intake or position. No history of travel, no hx of DVT. Pain 8/10.         History provided by:  Patient  Language interpreter used: No          Medical/Surgical/Family History     Past Medical History:   Diagnosis Date    Asthma         There is no problem list on file for this patient.           Past Surgical History:   Procedure Laterality Date    CHOLECYSTECTOMY            Social History     Tobacco Use    Smoking status: Every Day     Packs/day: .5     Types: Cigarettes    Smokeless tobacco: Never   Substance Use Topics    Alcohol use: Not Currently    Drug use: Not Currently             Review of Systems   Respiratory:  Positive for shortness of breath.    Cardiovascular:  Positive for chest pain.   Psychiatric/Behavioral:  Negative for agitation.        Physical Exam     Triage Vitals  Triage Start: Start, (01/22/23 1840)  First Recorded BP: 131/65, Resp: 18, Temp: 36 C (96.8 F), Temp src: TEMPORAL Oxygen Therapy SpO2: 100 %, Oximetry Source: Rt Hand, O2 Device: None (Room air), Heart Rate: 64, (01/22/23 1842)  .  First Pain Reported  0-10 Scale: 8, Pain Location/Orientation: Chest, (01/22/23 1842)       Physical Exam  Vitals and nursing note reviewed.   HENT:      Head: Atraumatic.      Mouth/Throat:      Pharynx: Oropharynx is clear.   Eyes:      Conjunctiva/sclera: Conjunctivae normal.   Cardiovascular:      Rate and Rhythm: Normal rate.      Heart sounds: No murmur heard.  Pulmonary:      Effort: Pulmonary effort is normal.      Breath sounds: Normal breath sounds.      Comments: Tender left center chest    Abdominal:      General: Abdomen is flat. There is no distension.      Tenderness:  There is no abdominal tenderness.   Musculoskeletal:         General: No swelling or tenderness.      Cervical back: Neck supple.      Comments: No sign of DVT     Neurological:      Mental Status: She is alert.      Comments: Sensation intact  Motor intact     Psychiatric:         Mood and Affect: Mood normal.         Medical Decision Making   Patient seen by me on:  01/22/2023    Assessment:  32 yo female with left chest pain which started 3 days ago. Concern for PE based on history of light-headedness, shortness of breath and chest pain. She  otherwise now is well appearing, normal oxygen saturation.       Differential diagnosis:  PE  ACS less likely based on history   Pneumonia less likely  GERD      Plan:  PO aspirin  Cxr shows no acute disease  Trop < 6   Cbc,bmp benign  D-dimer acceptable    EKG Interpretation:  No sign of acute ischemia    ED Course and Disposition:  Discharged home with pcp follow-up, PO pepcid trial for acid suppression. Follow-up with primary doctor as needed.            Weston Settle, MD            Weston Settle, MD  01/22/23 2030

## 2023-02-10 LAB — EKG 12-LEAD
P: 61 deg
PR: 155 ms
QRS: 2 deg
QRSD: 92 ms
QT: 446 ms
QTc: 453 ms
Rate: 62 {beats}/min
T: -34 deg

## 2023-02-23 LAB — UNMAPPED LAB RESULTS
Hematocrit (HT): 38 % (ref 34–47)
Hemoglobin (HGB) (HT): 13.2 g/dL (ref 11.5–16.0)
MCHC (HT): 34.8 g/dL (ref 32.0–36.0)
MCV (HT): 89.6 fL (ref 81.0–99.0)
Mean Corpuscular Hemoglobin (MCH) (HT): 31.2 pg (ref 26.0–34.0)
Platelets (HT): 252 10 3/uL (ref 150–450)
RBC (HT): 4.23 10 6/uL (ref 3.80–5.20)
RDW (HT): 12.8 % (ref 11.5–15.0)
WBC (HT): 7.4 10 3/uL (ref 4.0–10.8)

## 2023-03-30 ENCOUNTER — Other Ambulatory Visit
Admission: RE | Admit: 2023-03-30 | Discharge: 2023-03-30 | Disposition: A | Discharge status: Home / Self Care | Payer: Medicaid Other | Source: Ambulatory Visit | Attending: Primary Care | Admitting: Primary Care

## 2023-03-30 DIAGNOSIS — Z8249 Family history of ischemic heart disease and other diseases of the circulatory system: Secondary | ICD-10-CM | POA: Insufficient documentation

## 2023-03-30 DIAGNOSIS — K769 Liver disease, unspecified: Secondary | ICD-10-CM | POA: Insufficient documentation

## 2023-03-30 LAB — COMPREHENSIVE METABOLIC PANEL
ALT: 18 U/L (ref 0–35)
AST: 20 U/L (ref 0–35)
Albumin: 4.4 g/dL (ref 3.5–5.2)
Alk Phos: 82 U/L (ref 35–105)
Anion Gap: 12 (ref 7–16)
Bilirubin,Total: 0.7 mg/dL (ref 0.0–1.2)
CO2: 23 mmol/L (ref 20–28)
Calcium: 9.2 mg/dL (ref 8.8–10.2)
Chloride: 102 mmol/L (ref 96–108)
Creatinine: 0.82 mg/dL (ref 0.51–0.95)
Glucose: 86 mg/dL (ref 60–99)
Lab: 10 mg/dL (ref 6–20)
Potassium: 3.9 mmol/L (ref 3.3–5.1)
Sodium: 137 mmol/L (ref 133–145)
Total Protein: 7.4 g/dL (ref 6.3–7.7)
eGFR BY CREAT: 96 *

## 2023-03-30 LAB — LIPID PANEL
Chol/HDL Ratio: 5.1
Cholesterol: 167 mg/dL
HDL: 33 mg/dL — ABNORMAL LOW (ref 40–60)
LDL Calculated: 94 mg/dL
Non HDL Cholesterol: 134 mg/dL
Triglycerides: 201 mg/dL — AB

## 2023-03-30 LAB — CBC
Hematocrit: 40 % (ref 34–49)
Hemoglobin: 13.7 g/dL (ref 11.2–16.0)
MCV: 89 fL (ref 75–100)
Platelets: 281 10*3/uL (ref 150–450)
RBC: 4.5 MIL/uL (ref 4.0–5.5)
RDW: 12.7 % (ref 0.0–15.0)
WBC: 5.3 10*3/uL (ref 3.5–11.0)

## 2023-03-30 LAB — TSH: TSH: 1.57 u[IU]/mL (ref 0.27–4.20)

## 2023-03-31 LAB — HEMOGLOBIN A1C: Hemoglobin A1C: 4.9 %

## 2023-10-05 LAB — UNMAPPED LAB RESULTS
HIV 1&2 ANTIGEN/ANTIBODY (HT): NONREACTIVE
Hep C Ab: NONREACTIVE

## 2023-11-13 LAB — UNMAPPED LAB RESULTS
Basophil # (HT): 0 10 3/uL (ref 0.0–0.2)
Basophil % (HT): 0 % (ref 0–2)
Eosinophil # (HT): 0.3 10 3/uL (ref 0.0–0.5)
Eosinophil % (HT): 3 % (ref 0–7)
Hematocrit (HT): 40 % (ref 34–47)
Hemoglobin (HGB) (HT): 14.2 g/dL (ref 11.5–16.0)
Lymphocyte # (HT): 1.7 10 3/uL (ref 0.9–3.8)
Lymphocyte % (HT): 22 % (ref 17–44)
MCHC (HT): 35.1 g/dL (ref 32.0–36.0)
MCV (HT): 87.1 fL (ref 81.0–99.0)
Mean Corpuscular Hemoglobin (MCH) (HT): 30.6 pg (ref 26.0–34.0)
Monocyte # (HT): 0.3 10 3/uL (ref 0.2–1.0)
Monocyte % (HT): 4 % (ref 4–12)
Neutrophil # (HT): 5.4 10 3/uL (ref 1.5–7.7)
Platelets (HT): 290 10 3/uL (ref 150–450)
RBC (HT): 4.64 10 6/uL (ref 3.80–5.20)
RDW (HT): 13.5 % (ref 11.5–15.0)
Seg Neut % (HT): 70 % (ref 40–75)
WBC (HT): 7.8 10 3/uL (ref 4.0–10.8)

## 2024-03-08 NOTE — Telephone Encounter (Signed)
 LAST SEEN: 01/29/22-AnnualCOMPLAINTS: Patient states that she has been having pain since Friday. It is both sides under her rib cage and back pain. Pain is also in her lower abdomen. She notes frequent urination with cloudy, dark urine. There is pain with urination. Notes nausea (no vomiting) since yesterday. Notes a yellow/white vaginal discharge. DENIES:  odor, itch, rash, lesions, fever. Lmp: 02/28/24, is sexually active, not on birth control-tubes are tied, has concerns with std exposure.Patient was made aware that she must be evaluated today. She was advised that there are no in office appointments at any of our [facility] locations. She was given the option to utilize our OBGYN virtual urgent carfe or go into in person urgent care. Patient is going to call her insurance company to make sure they cover the virtual visit. She was sent the instructions for that in a mycare message. If they do not she will go to in person urgent care. She is aware that she must be seen as soon as possible today and that she should increase PO fluid intake. If symptoms worsen at all she was advised to go directly to urgent care or the ER. Patient states understanding, is in agreement with this plan and denies further questions at this time.Reason for Disposition. Side (flank) or lower back pain present. Unusual vaginal discharge. Patient is worried they have a sexually transmitted infection (STI). Painful urination AND EITHER frequency or urgencyProtocols used: Urination Pain - Female-A-[place]

## 2024-03-08 NOTE — Telephone Encounter (Signed)
 Call Information  What is the primary reason you are calling today?: Appointment                                                                        Other Info: Patient called to schedule appt she is currently experiencing, abd cramping, urinary frequency, urinary pain, urine is cloudy, and nausea.

## 2024-03-11 ENCOUNTER — Emergency Department: Admitting: Radiology

## 2024-03-11 ENCOUNTER — Other Ambulatory Visit: Payer: Self-pay

## 2024-03-11 ENCOUNTER — Emergency Department
Admission: EM | Admit: 2024-03-11 | Discharge: 2024-03-11 | Disposition: A | Discharge status: Home / Self Care | Source: Ambulatory Visit | Attending: Student in an Organized Health Care Education/Training Program | Admitting: Student in an Organized Health Care Education/Training Program

## 2024-03-11 DIAGNOSIS — N12 Tubulo-interstitial nephritis, not specified as acute or chronic: Secondary | ICD-10-CM | POA: Insufficient documentation

## 2024-03-11 DIAGNOSIS — F17210 Nicotine dependence, cigarettes, uncomplicated: Secondary | ICD-10-CM | POA: Insufficient documentation

## 2024-03-11 DIAGNOSIS — R109 Unspecified abdominal pain: Secondary | ICD-10-CM

## 2024-03-11 LAB — URINALYSIS WITH REFLEX TO MICROSCOPIC
Glucose,UA: NEGATIVE
Ketones, UA: NEGATIVE
Leuk Esterase,UA: NEGATIVE
Nitrite,UA: NEGATIVE
Specific Gravity,UA: >=1.03 — AB (ref 1.002–1.030)
pH,UA: 6 (ref 5.0–8.0)

## 2024-03-11 LAB — RUQ PANEL (ED ONLY)
ALT: 14 U/L (ref 0–35)
AST: 20 U/L (ref 0–35)
Albumin: 4.4 g/dL (ref 3.5–5.2)
Alk Phos: 94 U/L (ref 35–105)
Amylase: 45 U/L (ref 28–100)
Bilirubin,Direct: <0.2 mg/dL (ref 0.0–0.3)
Bilirubin,Total: 0.8 mg/dL (ref 0.0–1.2)
Lipase: 18 U/L (ref 13–60)
Total Protein: 7.2 g/dL (ref 6.3–7.7)

## 2024-03-11 LAB — BASIC METABOLIC PANEL
Anion Gap: 11 (ref 7–16)
CO2: 26 mmol/L (ref 20–28)
Calcium: 9.3 mg/dL (ref 8.8–10.2)
Chloride: 102 mmol/L (ref 96–108)
Creatinine: 0.72 mg/dL (ref 0.51–0.95)
Glucose: 108 mg/dL — ABNORMAL HIGH (ref 60–99)
Lab: 9 mg/dL (ref 6–20)
Potassium: 4.2 mmol/L (ref 3.3–5.1)
Sodium: 139 mmol/L (ref 133–145)
eGFR BY CREAT: 111

## 2024-03-11 LAB — CBC AND DIFFERENTIAL
Baso # K/uL: 0 THOU/uL (ref 0.0–0.2)
Eos # K/uL: 0.3 THOU/uL (ref 0.0–0.5)
Hematocrit: 38 % (ref 34–49)
Hemoglobin: 13.2 g/dL (ref 11.2–16.0)
IMM Granulocytes #: 0 THOU/uL
IMM Granulocytes: 0.2 %
Lymph # K/uL: 1.9 THOU/uL (ref 1.0–5.0)
MCV: 87 fL (ref 75–100)
Mono # K/uL: 0.4 THOU/uL (ref 0.1–1.0)
Neut # K/uL: 4 THOU/uL (ref 1.5–6.5)
Platelets: 240 THOU/uL (ref 150–450)
RBC: 4.3 MIL/uL (ref 4.0–5.5)
RDW: 13.1 % (ref 0.0–15.0)
Seg Neut %: 60.8 %
WBC: 6.6 THOU/uL (ref 3.5–11.0)

## 2024-03-11 LAB — URINE MICROSCOPIC (IQ200): Hyaline Casts,UA: NONE SEEN (ref 0–5)

## 2024-03-11 LAB — POCT URINE PREGNANCY
Lot #: 241074
Preg Test,UR POC: NEGATIVE

## 2024-03-11 MED ORDER — CEPHALEXIN 500 MG PO CAPS *I*
500.0000 mg | ORAL_CAPSULE | Freq: Four times a day (QID) | ORAL | 0 refills | Status: AC
Start: 2024-03-11 — End: 2024-03-18

## 2024-03-11 MED ORDER — ONDANSETRON 4 MG PO TBDP *I*
4.0000 mg | ORAL_TABLET | Freq: Three times a day (TID) | ORAL | 0 refills | Status: AC | PRN
Start: 2024-03-11 — End: ?

## 2024-03-11 MED ORDER — KETOROLAC TROMETHAMINE 30 MG/ML IJ SOLN *I*
15.0000 mg | Freq: Once | INTRAMUSCULAR | Status: AC
Start: 2024-03-11 — End: 2024-03-11
  Administered 2024-03-11: 15 mg via INTRAVENOUS
  Filled 2024-03-11: qty 1

## 2024-03-11 NOTE — Discharge Instructions (Addendum)
 Mary Ayala Mary Ayala was seen at the [facility] Emergency Room for flank pain on March 11, 2024.Blood tests and CT scan(s) were completed which were reassuring. Stop the Macrobid. Start Keflex  to treat for a kidney infection. Follow up with your PCP in 48 hours to review the urine culture results. Please return to our emergency department or seek evaluation for fever greater than 103F despite Tylenol  or Motrin use, unable to eat or drink, vomiting, fainting, and severe pain, or any new or concerning symptoms. Thank you for choosing [facility] for your care.

## 2024-03-11 NOTE — ED Triage Notes (Signed)
 Patient c/o right flank pain x1 week. Started on antibiotics Tuesdays for UTI +ecoli. No fevers. +headache and nausea  Prehospital medications given: No

## 2024-03-11 NOTE — ED Provider Notes (Signed)
 History Chief Complaint Patient presents with  Flank Pain Tillie Viverette Kalyn Hofstra is a 33 y.o. female with a past medical history of asthma, cholecystectomy (2016), and IV drug use who presents to the ED with a chief complaint of right flank pain onset 1 week prior. Patient reports constant right flank pain that somewhat radiates to her right side/right upper quadrant. She states laying on her side worsens the flank pain.  She started taking Macrobid on Tuesday and has had resolution of dysuria and urinary frequency/urgency since starting this, but is still complaining of the right flank pain.  Reports feeling diaphoresis and nausea, but denies fevers, chills, cough, chest pain, shortness of breath, weakness, numbness, recent illness, cough, leg swelling, abdominal pain, vomiting, abnormal vaginal discharge, or other symptoms. Denies history of PE/DVT, unilateral leg swelling, recent travel/trauma, recent immobilization, recent malignancy treatment, hemoptysis, or hormone use.History provided by:  PatientLanguage interpreter used: No  Medical/Surgical/Family History Past Medical History[1] There is no problem list on file for this patient. Past Surgical History[2] Social History[3]  Review of SystemsPhysical Exam Triage VitalsTriage Start: Start, (03/11/24 1057)  First Recorded BP: 121/74, Resp: 18, Temp: 36.3 C (97.4 F), Temp src: Oral Oxygen Therapy SpO2: 100 %, O2 Device: None (Room air), Heart Rate: 63, (03/11/24 1059)  .First Pain Reported 0-10  Pain Scale: 10, Pain Location/Orientation: Flank Right, (03/11/24 1059) Physical ExamVitals and nursing note reviewed. Constitutional:     Appearance: Normal appearance. HENT:    Head: Normocephalic and atraumatic.    Mouth/Throat:    Mouth: Mucous membranes are moist.    Pharynx: Oropharynx is clear. Eyes:    Conjunctiva/sclera: Conjunctivae normal. Cardiovascular:     Rate and Rhythm: Normal rate and regular rhythm.    Heart sounds: Normal heart sounds. Pulmonary:    Effort: Pulmonary effort is normal.    Breath sounds: Normal breath sounds. Abdominal:    Palpations: Abdomen is soft.    Tenderness: There is abdominal tenderness (Mild tenderness along the right flank extending to the lateral upper right abdomen, without overt tenderness over the right upper quadrant). There is right CVA tenderness. There is no left CVA tenderness, guarding or rebound. Musculoskeletal:    Right lower leg: No edema.    Left lower leg: No edema. Skin:   General: Skin is warm and dry. Neurological:    General: No focal deficit present.    Mental Status: She is alert and oriented to person, place, and time. Medical Decision Making Patient seen by me on:  9/12/2025Assessment:  Koa Palla is a 33 y.o. female who presents with concerns of right flank pain for 1 week.  She is afebrile and hemodynamically stable.  She is very well-appearing.  She does have right CVA tenderness so could consider underlying nephrolithiasis.  Patient is currently on Macrobid to treat for UTI, however does not have great renal penetration so she may have a partially treated UTI for which antibiotics changed for appropriate treatment.  Patient is not febrile and overall very well-appearing with partially resolved symptoms, so have a low suspicion for a perinephric abscess or other complication that would warrant imaging with IV contrast at this time especially as she may not being on the correct antibiotic to treat underlying pyelonephritis. Patient does not have any prior history of renal stones, so we will obtain a CT without contrast of the abdomen/pelvis to evaluate for this.  Patient has a prior cholecystectomy, so unlikely for her to have another pathology related to this.  No recent illness to suggest an atypical presentation for pneumonia or risk factors for  an atypical presentation of a PE.  Labs and imaging ordered as below.Differential diagnosis:  Nephrolithiasis, pyelonephritisPlan:  Orders Placed This Encounter    Aerobic bacterial urine culture    CT abdomen and pelvis without contrast    CBC and differential    Basic metabolic panel    Urinalysis with reflex to micro (LAB)    Urine microscopic (iq200)    RUQ panel (ED only)    POCT urine pregnancy    ketorolac  (TORADOL ) 30 mg/mL injection 15 mgIndependent interpretation of imaging: Questionable renal stone on my review of CT, will await radiology reportED Course and Disposition:  Given persistent chills and nausea, and UA results will continue antibiotics, but discontinue Macrobid in place of Keflex  for adequate renal penetration. No renal stone on CT. Instructed patient to follow up with PCP. Sent Rx for zofran  PRN nausea/vomiting. Discussed return precautions for worsening or any new concerning symptoms. Patient verbalizes understanding and is in agreement with plan. All questions answered at this time. ED Course as of 03/11/24 1311 Fri Mar 11, 2024 1148 CBC and differentialNo leukocytosis or anemia 1148 Urinalysis with reflex to micro (LAB)(!)1+ blood, 6-10 WBC, 2+ bacteria, squam 1+. Nitrites negative. Leuk negative.Supportive of infectious process given initial urinary symptoms, but also has now been partially treated.  1151 Preg Test,UR POC: Negative 1204 Creatinine: 0.72No renal injury 1205 RUQ panel (ED only)WNL 1310 CT abdomen and pelvis without contrastNo etiology of the patient's right flank pain identified. No urologic calculi. Normal appendix.  1311 Reports pain has resolved.  Lauraine Agar, MD  [1] Past Medical History:Diagnosis Date  Asthma  [2] Past Surgical History:Procedure Laterality Date  CHOLECYSTECTOMY   [3] Social HistoryTobacco Use  Smoking status: Every Day    Packs/day: .5   Types: Cigarettes  Smokeless tobacco: Never Substance Use Topics  Alcohol use: Not Currently  Drug use: Not Currently  Agar Lauraine, MD09/12/25 1312

## 2024-03-12 LAB — AEROBIC BACTERIAL URINE CULTURE: Aerobic bacterial urine culture: 0

## 2024-04-08 ENCOUNTER — Encounter: Payer: Self-pay | Admitting: Internal Medicine

## 2024-04-28 ENCOUNTER — Other Ambulatory Visit
Admission: RE | Admit: 2024-04-28 | Discharge: 2024-04-28 | Disposition: A | Discharge status: Home / Self Care | Source: Ambulatory Visit

## 2024-04-28 DIAGNOSIS — R5383 Other fatigue: Secondary | ICD-10-CM | POA: Insufficient documentation

## 2024-04-28 LAB — COMPREHENSIVE METABOLIC PANEL
ALT: 29 U/L (ref 0–35)
AST: 34 U/L (ref 0–35)
Albumin: 4.7 g/dL (ref 3.5–5.2)
Alk Phos: 91 U/L (ref 35–105)
Anion Gap: 11 (ref 7–16)
Bilirubin,Total: 0.8 mg/dL (ref 0.0–1.2)
CO2: 24 mmol/L (ref 20–28)
Calcium: 9.8 mg/dL (ref 8.8–10.2)
Chloride: 104 mmol/L (ref 96–108)
Creatinine: 0.71 mg/dL (ref 0.51–0.95)
Glucose: 95 mg/dL (ref 60–99)
Lab: 14 mg/dL (ref 6–20)
Potassium: 4.5 mmol/L (ref 3.3–5.1)
Sodium: 139 mmol/L (ref 133–145)
Total Protein: 7.8 g/dL — ABNORMAL HIGH (ref 6.3–7.7)
eGFR BY CREAT: 113

## 2024-04-28 LAB — LIPID PANEL
Chol/HDL Ratio: 4.6
Cholesterol: 181 mg/dL
HDL: 39 mg/dL — ABNORMAL LOW (ref 40–60)
LDL Calculated: 121 mg/dL
Non HDL Cholesterol: 142 mg/dL
Triglycerides: 118 mg/dL

## 2024-04-28 LAB — HEMOGLOBIN A1C: Hemoglobin A1C: 4.7 % (ref <=5.6)

## 2024-04-28 LAB — CBC
Hematocrit: 38 % (ref 34–49)
Hemoglobin: 13.1 g/dL (ref 11.2–16.0)
MCV: 88 fL (ref 75–100)
Platelets: 275 THOU/uL (ref 150–450)
RBC: 4.3 MIL/uL (ref 4.0–5.5)
RDW: 12.9 % (ref 0.0–15.0)
WBC: 7.4 THOU/uL (ref 3.5–11.0)

## 2024-04-28 LAB — TSH: TSH: 0.64 u[IU]/mL (ref 0.27–4.20)

## 2024-06-19 ENCOUNTER — Emergency Department: Admitting: Radiology

## 2024-06-19 ENCOUNTER — Emergency Department
Admission: EM | Admit: 2024-06-19 | Discharge: 2024-06-19 | Disposition: A | Discharge status: Home / Self Care | Source: Ambulatory Visit | Attending: Emergency Medicine | Admitting: Emergency Medicine

## 2024-06-19 ENCOUNTER — Other Ambulatory Visit: Payer: Self-pay

## 2024-06-19 DIAGNOSIS — Z789 Other specified health status: Secondary | ICD-10-CM

## 2024-06-19 DIAGNOSIS — I517 Cardiomegaly: Secondary | ICD-10-CM

## 2024-06-19 DIAGNOSIS — R519 Headache, unspecified: Secondary | ICD-10-CM | POA: Insufficient documentation

## 2024-06-19 DIAGNOSIS — F17210 Nicotine dependence, cigarettes, uncomplicated: Secondary | ICD-10-CM | POA: Insufficient documentation

## 2024-06-19 LAB — BASIC METABOLIC PANEL
Anion Gap: 8 (ref 7–16)
CO2: 27 mmol/L (ref 20–28)
Calcium: 9.5 mg/dL (ref 8.8–10.2)
Chloride: 102 mmol/L (ref 96–108)
Creatinine: 0.87 mg/dL (ref 0.51–0.95)
Glucose: 104 mg/dL — ABNORMAL HIGH (ref 60–99)
Lab: 15 mg/dL (ref 6–20)
Potassium: 4.8 mmol/L (ref 3.3–5.1)
Sodium: 137 mmol/L (ref 133–145)
eGFR BY CREAT: 88

## 2024-06-19 LAB — CBC AND DIFFERENTIAL
Baso # K/uL: 0 THOU/uL (ref 0.0–0.2)
Eos # K/uL: 0.3 THOU/uL (ref 0.0–0.5)
Hematocrit: 39 % (ref 34–49)
Hemoglobin: 13.5 g/dL (ref 11.2–16.0)
IMM Granulocytes #: 0 THOU/uL (ref 0–0)
IMM Granulocytes: 0 %
Lymph # K/uL: 2.2 THOU/uL (ref 1.0–5.0)
MCV: 88 fL (ref 75–100)
Mono # K/uL: 0.3 THOU/uL (ref 0.1–1.0)
Neut # K/uL: 3.4 THOU/uL (ref 1.5–6.5)
Platelets: 244 THOU/uL (ref 150–450)
RBC: 4.4 MIL/uL (ref 4.0–5.5)
RDW: 12.4 % (ref 0.0–15.0)
Seg Neut %: 54.5 %
WBC: 6.3 THOU/uL (ref 3.5–11.0)

## 2024-06-19 LAB — EKG 12-LEAD
P: 74 deg
PR: 159 ms
QRS: 21 deg
QRSD: 94 ms
QT: 462 ms
QTc: 426 ms
Rate: 51 {beats}/min
T: -24 deg

## 2024-06-19 LAB — COVID/INFLUENZA A & B/RSV NAAT (PCR)
COVID-19 NAAT (PCR): NEGATIVE
Influenza A NAAT (PCR): NEGATIVE
Influenza B NAAT (PCR): NEGATIVE
RSV NAAT (PCR): NEGATIVE

## 2024-06-19 LAB — POCT URINE PREGNANCY
Lot #: 241074
Preg Test,UR POC: NEGATIVE

## 2024-06-19 MED ORDER — KETOROLAC TROMETHAMINE 30 MG/ML IJ SOLN *I*
30.0000 mg | Freq: Once | INTRAMUSCULAR | Status: AC
  Administered 2024-06-19: 30 mg via INTRAVENOUS
  Filled 2024-06-19: qty 1

## 2024-06-19 MED ORDER — PROCHLORPERAZINE MALEATE 5 MG PO TABS *I*: 5.0000 mg | ORAL_TABLET | Freq: Four times a day (QID) | ORAL | 0 refills | Status: AC | PRN

## 2024-06-19 MED ORDER — MAGNESIUM SULFATE 2 GM IN 50 ML *WRAPPED*
2000.0000 mg | Freq: Once | INTRAVENOUS | Status: AC
  Administered 2024-06-19: 2000 mg via INTRAVENOUS
  Filled 2024-06-19: qty 50

## 2024-06-19 NOTE — Discharge Instructions (Signed)
 You can take compazine  for headaches but it is sedating, so if you take it, you cannot drive. Please see your doctor if these headaches continue.Please return to the Emergency Department for:Any new neurologic symptomsWorsening headacheVomitingStiff neckFever Or any worsening of your condition

## 2024-06-19 NOTE — ED Provider Notes (Signed)
 History Chief Complaint Patient presents with  Headache HPIHistory of Present IllnessThis is a 33 year old female with a history of depression and bipolar disorder presenting with a persistent headache.The patient has been experiencing a severe headache since June 13, 2024, which has not subsided despite taking Tylenol . The onset of the headache was sudden, occurring upon waking. She describes the pain as being located at the back of her head and behind her eyes. This is the most severe headache she has ever experienced. Initially, she experienced body aches, chills, and nausea, but these symptoms only lasted for two days. Currently, she experiences dizziness and blurred vision when standing up too quickly, which resolves upon closing and reopening her eyes. She reports no chest pain, shortness of breath, leg swelling, or leg pain. She is not on any hormonal or contraceptive medications and has not traveled recently. Her appetite and hydration status are normal. She sought medical attention at an urgent care facility on tuesday where she was tested for COVID-19 and influenza, both of which were negative. She suspected COVID-19 due to recent exposure to her sister and father, who had contracted the virus. She was informed of fluid in her ear at the urgent care visit but was reassured that it was not a cause for concern. She received an anti-inflammatory injection at the urgent care facility. The patient reports no urinary symptoms such as burning during urination. She has not taken ibuprofen recently.Her current medications include methadone, clonazepam, and lamotrigine, which she has been taking for over three years for depression and bipolar disorder. She reports no missed doses or changes in her medication regimen. The patient smokes cigarettes.SOCIAL HISTORYShe does not report alcohol or drug use. She smokes cigarettes.Headache: - Neck stiffness: absent -  Persistent vomiting: absent - Syncope: absent - Maximum severity at onset: no - Onset during exertion: absentMedical/Surgical/Family History Past Medical History[1] There is no problem list on file for this patient. Past Surgical History[2] Social History[3]  Review of SystemsPhysical Exam Triage VitalsTriage Start: Start, (06/19/24 1245)  First Recorded BP: 124/65, Resp: 18, Temp: 36.8 C (98.3 F), Temp src: Oral Oxygen Therapy SpO2: 99 %, Oximetry Source: Rt Hand, O2 Device: None (Room air), Heart Rate: 72, (06/19/24 1248)  .First Pain Reported 0-10  Pain Scale: 9, Pain Location/Orientation: Head, (06/19/24 1248) Physical ExamVitals and nursing note reviewed. Exam conducted with a chaperone present. Constitutional:     Appearance: Normal appearance. She is normal weight. HENT:    Mouth/Throat:    Mouth: Mucous membranes are moist. Eyes:    Extraocular Movements: Extraocular movements intact.    Pupils: Pupils are equal, round, and reactive to light. Cardiovascular:    Rate and Rhythm: Normal rate and regular rhythm. Pulmonary:    Effort: Pulmonary effort is normal. No respiratory distress.    Breath sounds: Normal breath sounds. Abdominal:    General: Abdomen is flat. There is no distension.    Tenderness: There is no abdominal tenderness. There is no right CVA tenderness, left CVA tenderness or guarding. Neurological:    General: No focal deficit present.    Mental Status: She is alert and oriented to person, place, and time. Psychiatric:       Mood and Affect: Mood normal.       Behavior: Behavior normal. Expanded Neurologic ExamCranial Nerves: II: PERRL, visual field exam: normalIII/IV/VI: EOM intact. No nystagmus. Eyelids open equal.V: Facial sensation symmetric to light touch VII: No Facial droop VIII: Hearing intact bilaterallyIX/X: Normal swallowing, normal voiceXI: Equal  shoulder shrugXII:  Tongue midlineAOx3Speech: NormalStrength intact x 4Sensation intact x 4Pronator drift was absentCoordination: Finger to nose intact Gait: normalTruncal Stability: able to sit upright {TIP (This is a neuro template for a patient with a normal exam, please change any items that were abnormal.):[id number]}Medical Decision Making Patient seen by me on:  12/21/2025Assessment:  33 year old female here with new onset headache for the last 5 days.  Overall she is well-appearing and has reassuring vital signs.  No clear cause based on initial history and exam.  No focal neurodeficits to suggest brain pathology but given this is a new headache type her I will get a CAT scan of the brain to look for brain tumor especially since it started upon wakening.  She has not had signs or symptoms consistent with subarachnoid hemorrhage or meningitis so LP not indicated.  Her symptoms are also more consistent with orthostasis so I will get an ECG and general labs. Will give mag and toradol  for HA as she would like to drive home. Differential diagnosis:  HeadacheMigraineCOVIDFluOrthostasisElectrolyte abnormalityPlan:  Orders Placed This Encounter    COVID/Influenza A & B/RSV NAAT (PCR)    CT head without contrast    CBC and differential    Basic metabolic panel    Initiate droplet isolation    POCT urine pregnancy    EKG 12 lead (initial)    magnesium  sulfate 2,000 mg in 50 mL IVPB    ketorolac  (TORADOL ) 30 mg/mL injection 30 mgED Course and Disposition:  HA resolved. CTH negative. Neuro MDM: Justification for/against neuroimaging: for - new onset HA, not similar to prior Elon Lomeli A Denario Bagot, MDAuthor:  Emmalynne Courtney A Shaunessy Dobratz, MD [1]Past Medical History:Diagnosis Date  Asthma  [2]Past Surgical History:Procedure Laterality Date  CHOLECYSTECTOMY   [3]Social HistoryTobacco Use   Smoking status: Every Day   Current packs/day: 0.50   Average packs/day: 0.5 packs/day for 1.4 years (0.7 ttl pk-yrs)   Types: Cigarettes   Start date: 01/22/2023  Smokeless tobacco: Never  Tobacco comments:   The data in the grid above may be an average based on historical data and used for Lung Cancer Screening Eligibility.  If you find it inaccurate you can delete it and take a more accurate history. Substance Use Topics  Alcohol use: Not Currently  Drug use: Not Currently

## 2024-06-19 NOTE — ED Notes (Signed)
 Plan of Care     Nursing Plan of Care: Will monitor and assess VS and pain scores every 2-4 hours and PRN, Perform frequent rounding PRN, provide updates to patient and/or caregiver, teach patient and/or caregiver about patients needs/status working toward discharge. Call bell within reach, patient oriented to room.

## 2024-06-19 NOTE — ED Triage Notes (Signed)
 Temporal headache radiating to posterior head, constant, since Monday. Reports dizziness, and blurred vision with standing too quickly. Denies N/V. Denies recent head injuries. Seen at [facility] Tuesday and (-) for all the virus's but noted to have fluid in R ear.  Prehospital medications given: No

## 2024-07-05 ENCOUNTER — Encounter: Payer: Self-pay | Admitting: Gastroenterology
